# Patient Record
Sex: Male | Born: 1955 | Race: White | Hispanic: No | Marital: Married | Smoking: Former smoker
Health system: Southern US, Community
[De-identification: ages and names within clinical notes are randomized; demographics above are authoritative.]

## PROBLEM LIST (undated history)

## (undated) DIAGNOSIS — K219 Gastro-esophageal reflux disease without esophagitis: Secondary | ICD-10-CM

## (undated) DIAGNOSIS — T4145XA Adverse effect of unspecified anesthetic, initial encounter: Secondary | ICD-10-CM

## (undated) DIAGNOSIS — C801 Malignant (primary) neoplasm, unspecified: Secondary | ICD-10-CM

## (undated) DIAGNOSIS — G4730 Sleep apnea, unspecified: Secondary | ICD-10-CM

## (undated) DIAGNOSIS — J45909 Unspecified asthma, uncomplicated: Secondary | ICD-10-CM

## (undated) DIAGNOSIS — C44319 Basal cell carcinoma of skin of other parts of face: Secondary | ICD-10-CM

## (undated) DIAGNOSIS — T8859XA Other complications of anesthesia, initial encounter: Secondary | ICD-10-CM

## (undated) DIAGNOSIS — R0600 Dyspnea, unspecified: Secondary | ICD-10-CM

## (undated) DIAGNOSIS — C8590 Non-Hodgkin lymphoma, unspecified, unspecified site: Secondary | ICD-10-CM

## (undated) DIAGNOSIS — R413 Other amnesia: Secondary | ICD-10-CM

## (undated) DIAGNOSIS — E119 Type 2 diabetes mellitus without complications: Secondary | ICD-10-CM

## (undated) DIAGNOSIS — C859A Non-Hodgkin lymphoma, unspecified, in remission: Secondary | ICD-10-CM

## (undated) DIAGNOSIS — R519 Headache, unspecified: Secondary | ICD-10-CM

## (undated) DIAGNOSIS — I509 Heart failure, unspecified: Secondary | ICD-10-CM

## (undated) DIAGNOSIS — Z8719 Personal history of other diseases of the digestive system: Secondary | ICD-10-CM

## (undated) DIAGNOSIS — I10 Essential (primary) hypertension: Secondary | ICD-10-CM

## (undated) DIAGNOSIS — R918 Other nonspecific abnormal finding of lung field: Secondary | ICD-10-CM

## (undated) DIAGNOSIS — M109 Gout, unspecified: Secondary | ICD-10-CM

## (undated) DIAGNOSIS — J449 Chronic obstructive pulmonary disease, unspecified: Secondary | ICD-10-CM

## (undated) DIAGNOSIS — M1990 Unspecified osteoarthritis, unspecified site: Secondary | ICD-10-CM

## (undated) DIAGNOSIS — G709 Myoneural disorder, unspecified: Secondary | ICD-10-CM

## (undated) DIAGNOSIS — I772 Rupture of artery: Secondary | ICD-10-CM

## (undated) DIAGNOSIS — R51 Headache: Secondary | ICD-10-CM

## (undated) HISTORY — PX: BACK SURGERY: SHX140

## (undated) HISTORY — DX: Heart failure, unspecified: I50.9

## (undated) HISTORY — PX: COLONOSCOPY: SHX174

## (undated) HISTORY — PX: KNEE ARTHROSCOPY: SUR90

## (undated) HISTORY — PX: BICEPS TENDON REPAIR: SHX566

## (undated) HISTORY — PX: CARPAL TUNNEL RELEASE: SHX101

## (undated) HISTORY — DX: Unspecified asthma, uncomplicated: J45.909

## (undated) HISTORY — DX: Type 2 diabetes mellitus without complications: E11.9

## (undated) HISTORY — PX: FINGER SURGERY: SHX640

## (undated) HISTORY — PX: PILONIDAL CYST EXCISION: SHX744

---

## 1997-06-01 ENCOUNTER — Ambulatory Visit (HOSPITAL_COMMUNITY): Admission: RE | Admit: 1997-06-01 | Discharge: 1997-06-01 | Payer: Self-pay | Admitting: Geriatric Medicine

## 1997-07-17 ENCOUNTER — Ambulatory Visit (HOSPITAL_COMMUNITY): Admission: RE | Admit: 1997-07-17 | Discharge: 1997-07-17 | Payer: Self-pay | Admitting: Gastroenterology

## 1997-07-20 ENCOUNTER — Inpatient Hospital Stay (HOSPITAL_COMMUNITY): Admission: EM | Admit: 1997-07-20 | Discharge: 1997-07-24 | Payer: Self-pay | Admitting: Emergency Medicine

## 1997-07-28 ENCOUNTER — Ambulatory Visit (HOSPITAL_COMMUNITY): Admission: RE | Admit: 1997-07-28 | Discharge: 1997-07-28 | Payer: Self-pay | Admitting: Gastroenterology

## 1997-07-29 ENCOUNTER — Ambulatory Visit (HOSPITAL_COMMUNITY): Admission: RE | Admit: 1997-07-29 | Discharge: 1997-07-29 | Payer: Self-pay | Admitting: Gastroenterology

## 1997-08-12 ENCOUNTER — Ambulatory Visit (HOSPITAL_COMMUNITY): Admission: RE | Admit: 1997-08-12 | Discharge: 1997-08-12 | Payer: Self-pay | Admitting: Hematology & Oncology

## 1997-08-14 ENCOUNTER — Ambulatory Visit (HOSPITAL_COMMUNITY): Admission: RE | Admit: 1997-08-14 | Discharge: 1997-08-14 | Payer: Self-pay | Admitting: Internal Medicine

## 1998-05-04 ENCOUNTER — Encounter: Admission: RE | Admit: 1998-05-04 | Discharge: 1998-08-02 | Payer: Self-pay | Admitting: *Deleted

## 2001-02-25 ENCOUNTER — Ambulatory Visit (HOSPITAL_BASED_OUTPATIENT_CLINIC_OR_DEPARTMENT_OTHER): Admission: RE | Admit: 2001-02-25 | Discharge: 2001-02-25 | Payer: Self-pay | Admitting: Rheumatology

## 2001-10-23 ENCOUNTER — Encounter: Payer: Self-pay | Admitting: Internal Medicine

## 2001-10-23 ENCOUNTER — Encounter: Admission: RE | Admit: 2001-10-23 | Discharge: 2001-10-23 | Payer: Self-pay | Admitting: Internal Medicine

## 2001-10-31 ENCOUNTER — Ambulatory Visit (HOSPITAL_COMMUNITY): Admission: RE | Admit: 2001-10-31 | Discharge: 2001-10-31 | Payer: Self-pay | Admitting: Internal Medicine

## 2003-02-19 ENCOUNTER — Ambulatory Visit: Admission: RE | Admit: 2003-02-19 | Discharge: 2003-02-19 | Payer: Self-pay

## 2004-06-30 ENCOUNTER — Ambulatory Visit (HOSPITAL_BASED_OUTPATIENT_CLINIC_OR_DEPARTMENT_OTHER): Admission: RE | Admit: 2004-06-30 | Discharge: 2004-06-30 | Payer: Self-pay | Admitting: Orthopaedic Surgery

## 2004-06-30 ENCOUNTER — Ambulatory Visit (HOSPITAL_COMMUNITY): Admission: RE | Admit: 2004-06-30 | Discharge: 2004-06-30 | Payer: Self-pay | Admitting: Orthopaedic Surgery

## 2004-07-11 ENCOUNTER — Encounter: Admission: RE | Admit: 2004-07-11 | Discharge: 2004-07-11 | Payer: Self-pay | Admitting: Internal Medicine

## 2004-07-27 ENCOUNTER — Ambulatory Visit (HOSPITAL_COMMUNITY): Admission: RE | Admit: 2004-07-27 | Discharge: 2004-07-27 | Payer: Self-pay | Admitting: Gastroenterology

## 2004-08-31 ENCOUNTER — Encounter: Admission: RE | Admit: 2004-08-31 | Discharge: 2004-08-31 | Payer: Self-pay | Admitting: Gastroenterology

## 2004-12-30 ENCOUNTER — Ambulatory Visit (HOSPITAL_COMMUNITY): Admission: RE | Admit: 2004-12-30 | Discharge: 2004-12-30 | Payer: Self-pay | Admitting: Orthopedic Surgery

## 2006-02-23 ENCOUNTER — Ambulatory Visit: Payer: Self-pay | Admitting: Internal Medicine

## 2006-05-16 DIAGNOSIS — Z8719 Personal history of other diseases of the digestive system: Secondary | ICD-10-CM

## 2006-05-16 HISTORY — PX: UPPER GASTROINTESTINAL ENDOSCOPY: SHX188

## 2006-05-16 HISTORY — DX: Personal history of other diseases of the digestive system: Z87.19

## 2009-06-11 ENCOUNTER — Encounter: Admission: RE | Admit: 2009-06-11 | Discharge: 2009-06-11 | Payer: Self-pay | Admitting: Internal Medicine

## 2010-06-17 NOTE — Op Note (Signed)
NAME:  Jeffery Wood, ANGERER                ACCOUNT NO.:  [id number]   MEDICAL RECORD NO.:  [id number]          PATIENT TYPE:  AMB   LOCATION:  SDS                          FACILITY:  [facility]   PHYSICIAN:  Almedia Balls. Ranell Patrick, M.D. DATE OF BIRTH:  1955-04-19   DATE OF PROCEDURE:  12/30/2004  DATE OF DISCHARGE:                                 OPERATIVE REPORT   PREOPERATIVE DIAGNOSIS:  Left carpal tunnel syndrome.   POSTOPERATIVE DIAGNOSIS:  Left carpal tunnel syndrome.   OPERATION PERFORMED:  Left carpal tunnel release.   SURGEON:  Almedia Balls. Ranell Patrick, M.D.   ASSISTANT:  __________ , P. A.-C   ANESTHESIA:  Local carpal tunnel block anesthesia plus minimal sedation was  used.   ESTIMATED BLOOD LOSS:  The estimated blood loss was minimal.   TOURNIQUET TIME:  The tourniquet time was 25 minutes.   COUNTS:  Instrument count was correct.   COMPLICATIONS:  There were no complications.   MEDICATIONS:  Preoperative antibiotics were given.   INDICATIONS:  The patient is a 55 year old male with a history of worsening  left carpal tunnel syndrome.  The patient has failed conservative  management.  The patient has been splinted.  He has had activity  modifications; and, has positive EMG and nerve conduction studies for severe  carpal tunnel.  He presents now for operative carpal tunnel release.   Informed consent was obtained.   DESCRIPTION OF THE OPERATION:  After an adequate level of anesthesia was  achieved the patient was positioned supine on the operating room table.  A  nonsterile tourniquet was placed on the left proximal arm.  The left arm was  sterile;y prepped and draped in the usual manner.  A hand table was  utilized.  After sterile prep and drape we exsanguinated the limb using an  Esmarch bandage.  We elevated the tourniquet to 275 mmHg.   A longitudinal palmar incision was created in line with the third and fourth  ray.  Dissection was carried sharply down through the subcutaneous  tissues.  The transverse carpal tunnel ligament was identified and divided in its  entirety from the midcarpal-to-carpal space into the superficial forearm  fascia.  Interestingly the patient's nerve was pressed far up against the  radial side of the carpal canal and was tethered, and not free at all.  I  was stuck up under the radial flap of the transverse carpal ligament  entering into the superficial forearm compartment.  I was concerned because  of the lack of mobility of the nerve; and, because of this I went ahead and  performed an incision across the volar wrist crease into the forearm to  allow for adequate visualization of the nerve in the forearm.  We noted the  nerve to be very scarred up against the radial side of the carpal canal.  We  performed a limited neurolysis freeing the nerve up from surrounding scar  tissue and getting it to where it was nice and free in the middle portion of  the carpal canal.   The individual nerve branches in the midpalmar space  were not dissected out  as they appeared to be covered in fat and appeared to be nice and immobile.  We did do a limited tenosynovectomy as well given that there was a lot of  tenosynovial tissue that was present and providing a mass effect in the  carpal canal.  Thus was removed sharply using the Metzenbaum scissors.  We  thoroughly irrigated the carpal canal.  The nerve did have a __________  deformity indicating chronic pressure and was to have a lot of hyperemia,  and purplish color to it; but, was not in continuity.   We did close the wound using interrupted nylon sutures.  We then placed the  patient in a short-arm sling.   The patient tolerated surgery well and was taken to the recovery room in  stable condition.           ______________________________  Almedia Balls Ranell Patrick, M.D.     SRN/MEDQ  D:  12/30/2004  T:  01/01/2005  Job:  [id number]

## 2010-06-17 NOTE — Op Note (Signed)
NAME:  Jeffery Wood, Jeffery Wood NO.:  [id number]   MEDICAL RECORD NO.:  [id number]          PATIENT TYPE:  AMB   LOCATION:  DSC                          FACILITY:  [facility]   PHYSICIAN:  Claude Manges. Whitfield, M.D.DATE OF BIRTH:  May 17, 1955   DATE OF PROCEDURE:  06/30/2004  DATE OF DISCHARGE:                                 OPERATIVE REPORT   PREOPERATIVE DIAGNOSIS:  Mass right long finger.   POSTOPERATIVE DIAGNOSIS:  Mass right long finger - cyst.   PROCEDURE:  Excision of cyst right long finger.   SURGEON:  Claude Manges. Cleophas Dunker, M.D.   ANESTHESIA:  IV regional.   COMPLICATIONS:  None.   HISTORY:  A 55 year old gentleman who has noted a mass in his right long  finger for several years.  He thinks recently it is a little bit larger and  slightly more symptomatic.  There has been no history of injury or trauma.  The mass feels hard but just recently it seems to increase and decrease in  size which would be more consistent with a cyst.  He has not had either an  excisional biopsy or excision of the mass.   DESCRIPTION OF PROCEDURE:  With the patient comfortable on the operating  table, IV Xylocaine was administered by anesthesia of the right upper  extremity with a proximal double tourniquet.  The right upper extremity was  then prepped with DuraPrep from the tips of the fingers to the mid forearm.  Sterile draping was performed.  A longitudinal incision was then made over  the dorsal ulnar aspect of the right long finger coursing just distal to the  mid portion of the middle phalanx dorsally to the PIP joint and then I  curved it over the transverse flexion of the PIP joint.  Via sharp  dissection, incision was carried down to the subcutaneous tissue. When the  skin was elevated, there was an obvious cyst that was beneath the extensor  mechanism and this was then incised.  The cyst was removed as well as its  synovial tissue.  It appeared to arise from either the extensor  mechanism or  the joint.  I made a small incision along the joint laterally and did a  minimal synovectomy.  The wound was irrigated with saline solution.  I  closed the capsulotomy and did not find any further abnormal tissue.  The  cyst appeared to be completely decompressed.  The skin was then closed with  interrupted 4-0 Ethilon.  The capsule was closed with 4-0 Vicryl.  Sterile  bulky dressing was applied.  I injected 0.25% Marcaine without epinephrine  into the incision.  Sterile bulky dressing was applied followed by Coban.   PLAN:  1.  Office in one week.  2.  Vicodin for pain.       PWW/MEDQ  D:  06/30/2004  T:  06/30/2004  Job:  [id number]

## 2010-06-17 NOTE — Assessment & Plan Note (Signed)
[facility]                             PULMONARY OFFICE NOTE   NAME:Jeffery Wood, Jeffery Wood                       MRN:          [id number]  DATE:02/23/2006                            DOB:          12/06/1955    PULMONARY/SLEEP FOLLOWUP   PROBLEM:  A 55 year old man who had been a patient at [facility] where he was last seen in 2004 comes now to  establish as a new patient with this practice for help with asthma and  sleep problems.   HISTORY:  He is a former smoker with a long history of asthma/COPD.  Office spirometry in 2004 had given an FEV1 of 3000 (70% of predicted)  after an insignificant response to bronchodilator.  He asks for a refill  of his chronic asthma medications and indicates he has caught a cold  with increased chest tightness and wheeze.  He also asks attention to  his difficulty initiating and maintaining sleep.  He had been using  clonazepam 0.5 mg but it carries over causing daytime sleepiness.  Without it, he has difficulty initiating sleep and will wake at least  two or three times every night.  His wife complains of his loud snoring.  He had tried at least one other medicine as a sleeping pill but is  concerned they will affect his memory.  He fights sleepiness if he sits  still at work.   MEDICATIONS:  1. Omeprazole.  2. Advair 250/50.  3. Atrovent.  4. Clonazepam 0.5 mg at h.s.  5. Home nebulizer with albuterol and ipratropium, currently out of      medication.   No medication allergy.   REVIEW OF SYSTEMS:  Loud snoring.  Daytime sleepiness if inactive.  He  wakes sometimes as tired as he went to bed.  Difficulty maintaining  sleep.  Cough and wheeze.  [place] or trace green sputum.  No blood, no  chest pain.   PAST HISTORY:  1. He had evaluation for suspected carcinoid syndrome with no tumor      ever found despite bronchoscopy and colonoscopy.  2. Chronic asthmatic bronchitis.  3.  Diabetes.  4. Psoriasis.  5. Gout.   SOCIAL HISTORY:  He quit smoking 1990.  Quit alcohol 1991.  Drinks a lot  of caffeine.  Married.  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   FAMILY HISTORY:  Asthma.   OBJECTIVE:  VITAL SIGNS:  Weight 294 pounds, BP 104/78, pulse regular at  82, room air saturation 96%.  GENERAL:  This is an alert, overweight gentleman, no acute distress.  SKIN:  No rash.  ADENOPATHY:  None found at the neck, shoulders or axillae.  HEENT:  He could breathe comfortably with his mouth closed.  Palate is  long, spacing 3-4/4.  Voice quality normal, no stridor.  No neck vein  distention or thyromegaly.  CHEST:  Diffuse rhonchi and wheeze, congested cough.  Heart sounds  regular without murmur or gallop.  ABDOMEN:  Significant male pattern obesity.  EXTREMITIES:  No  cyanosis, clubbing or edema.   NPSG:  He had a sleep study on February 25, 2001, through the [facility]  sleep center demonstrating moderately-severe obstructive apnea with an  index of 53 per hour, all of which were hypopneas.  Events were not  positional, associated with moderate snoring, normal oxygenation with  nadir 91%, normal cardiac rhythm.  He could not tolerate CPAP for a  split-study titration protocol on that night and has never tried CPAP at  home.   IMPRESSION:  1. Acute exacerbation of asthma with chronic obstructive pulmonary      disease.  2. Obstructive sleep apnea with insomnia despite clonazepam.      Inadequate control without CPAP.  3. Obesity.   PLAN:  1. Nebulizer treatment here Xopenex 1.25 mg, Depo-Medrol 80 mg IM  2. We refilled nebulizer solution albuterol 2.5 mg/ipratropium 0.5 mg      q.i.d. p.r.n.  3. Weight loss and driving responsibility were emphasized.  4. We are arranging CPAP titration and he is going to try again with      newer mask styles, anticipating he may benefit from a nasal pillows      mask and  humidifier.   Will schedule return 1 month, earlier p.r.n.     Clinton D. Maple Hudson, MD, Tonny Bollman, FACP  Electronically Signed    CDY/MedQ  DD: 02/23/2006  DT: 02/23/2006  Job #: [id number]   cc:   Erskine Speed, M.D.

## 2010-10-27 ENCOUNTER — Ambulatory Visit (HOSPITAL_COMMUNITY)
Admission: RE | Admit: 2010-10-27 | Discharge: 2010-10-27 | Disposition: A | Discharge status: Home / Self Care | Payer: BC Managed Care – PPO | Source: Ambulatory Visit | Attending: Internal Medicine | Admitting: Internal Medicine

## 2010-10-27 DIAGNOSIS — R079 Chest pain, unspecified: Secondary | ICD-10-CM | POA: Insufficient documentation

## 2010-12-01 ENCOUNTER — Ambulatory Visit
Admission: RE | Admit: 2010-12-01 | Discharge: 2010-12-01 | Disposition: A | Discharge status: Home / Self Care | Payer: BC Managed Care – PPO | Source: Ambulatory Visit | Attending: Cardiology | Admitting: Cardiology

## 2010-12-01 ENCOUNTER — Other Ambulatory Visit: Payer: Self-pay | Admitting: Cardiology

## 2010-12-01 DIAGNOSIS — R0602 Shortness of breath: Secondary | ICD-10-CM

## 2010-12-05 ENCOUNTER — Encounter (HOSPITAL_COMMUNITY): Payer: Self-pay

## 2010-12-06 ENCOUNTER — Encounter (HOSPITAL_COMMUNITY): Payer: Self-pay | Admitting: Pharmacy Technician

## 2010-12-07 ENCOUNTER — Ambulatory Visit (HOSPITAL_COMMUNITY)
Admission: RE | Admit: 2010-12-07 | Discharge: 2010-12-07 | Disposition: A | Discharge status: Home / Self Care | Payer: BC Managed Care – PPO | Source: Ambulatory Visit | Attending: Cardiology | Admitting: Cardiology

## 2010-12-07 ENCOUNTER — Encounter (HOSPITAL_COMMUNITY)
Admission: RE | Disposition: A | Discharge status: Home / Self Care | Payer: Self-pay | Source: Ambulatory Visit | Attending: Cardiology

## 2010-12-07 ENCOUNTER — Encounter (HOSPITAL_COMMUNITY): Payer: Self-pay

## 2010-12-07 DIAGNOSIS — I10 Essential (primary) hypertension: Secondary | ICD-10-CM | POA: Insufficient documentation

## 2010-12-07 DIAGNOSIS — E119 Type 2 diabetes mellitus without complications: Secondary | ICD-10-CM | POA: Insufficient documentation

## 2010-12-07 DIAGNOSIS — R9439 Abnormal result of other cardiovascular function study: Secondary | ICD-10-CM | POA: Insufficient documentation

## 2010-12-07 DIAGNOSIS — R739 Hyperglycemia, unspecified: Secondary | ICD-10-CM | POA: Insufficient documentation

## 2010-12-07 DIAGNOSIS — E669 Obesity, unspecified: Secondary | ICD-10-CM | POA: Insufficient documentation

## 2010-12-07 DIAGNOSIS — R0789 Other chest pain: Secondary | ICD-10-CM | POA: Insufficient documentation

## 2010-12-07 DIAGNOSIS — Z01812 Encounter for preprocedural laboratory examination: Secondary | ICD-10-CM | POA: Insufficient documentation

## 2010-12-07 HISTORY — PX: LEFT HEART CATHETERIZATION WITH CORONARY ANGIOGRAM: SHX5451

## 2010-12-07 LAB — CBC
HCT: 45.3 % (ref 39.0–52.0)
MCH: 32.5 pg (ref 26.0–34.0)
MCHC: 35.8 g/dL (ref 30.0–36.0)
MCV: 90.8 fL (ref 78.0–100.0)
RDW: 13 % (ref 11.5–15.5)

## 2010-12-07 LAB — GLUCOSE, CAPILLARY: Glucose-Capillary: 123 mg/dL — ABNORMAL HIGH (ref 70–99)

## 2010-12-07 SURGERY — LEFT HEART CATHETERIZATION WITH CORONARY ANGIOGRAM
Anesthesia: LOCAL

## 2010-12-07 MED ORDER — SODIUM CHLORIDE 0.9 % IV SOLN
INTRAVENOUS | Status: DC
  Administered 2010-12-07: 10:00:00 via INTRAVENOUS

## 2010-12-07 MED ORDER — FENTANYL CITRATE 0.05 MG/ML IJ SOLN
INTRAMUSCULAR | Status: AC
  Administered 2010-12-07: 15:00:00
  Filled 2010-12-07: qty 2

## 2010-12-07 MED ORDER — MIDAZOLAM HCL 2 MG/2ML IJ SOLN
INTRAMUSCULAR | Status: AC
  Filled 2010-12-07: qty 2

## 2010-12-07 MED ORDER — ASPIRIN 81 MG PO CHEW: 243.0000 mg | CHEWABLE_TABLET | Freq: Once | ORAL | Status: DC

## 2010-12-07 MED ORDER — SODIUM CHLORIDE 0.9 % IJ SOLN: 3.0000 mL | Freq: Two times a day (BID) | INTRAMUSCULAR | Status: DC

## 2010-12-07 MED ORDER — ONDANSETRON HCL 4 MG/2ML IJ SOLN: 4.0000 mg | Freq: Four times a day (QID) | INTRAMUSCULAR | Status: DC | PRN

## 2010-12-07 MED ORDER — SODIUM CHLORIDE 0.9 % IJ SOLN: 3.0000 mL | INTRAMUSCULAR | Status: DC | PRN

## 2010-12-07 MED ORDER — MORPHINE SULFATE 4 MG/ML IJ SOLN
INTRAMUSCULAR | Status: AC
  Administered 2010-12-07: 1 mg
  Filled 2010-12-07: qty 1

## 2010-12-07 MED ORDER — FENTANYL CITRATE 0.05 MG/ML IJ SOLN
INTRAMUSCULAR | Status: AC
  Filled 2010-12-07: qty 2

## 2010-12-07 MED ORDER — VERAPAMIL HCL 2.5 MG/ML IV SOLN
INTRAVENOUS | Status: AC
  Filled 2010-12-07: qty 2

## 2010-12-07 MED ORDER — MORPHINE SULFATE 2 MG/ML IJ SOLN: 1.0000 mg | INTRAMUSCULAR | Status: DC | PRN

## 2010-12-07 MED ORDER — ASPIRIN 81 MG PO CHEW
324.0000 mg | CHEWABLE_TABLET | ORAL | Status: DC
  Filled 2010-12-07: qty 3

## 2010-12-07 MED ORDER — SODIUM CHLORIDE 0.9 % IV SOLN: 250.0000 mL | INTRAVENOUS | Status: DC

## 2010-12-07 MED ORDER — OXYCODONE-ACETAMINOPHEN 5-325 MG PO TABS
1.0000 | ORAL_TABLET | Freq: Four times a day (QID) | ORAL | Status: DC | PRN
  Administered 2010-12-07: 2 via ORAL

## 2010-12-07 MED ORDER — NITROGLYCERIN 0.2 MG/ML ON CALL CATH LAB
INTRAVENOUS | Status: AC
  Filled 2010-12-07: qty 1

## 2010-12-07 MED ORDER — SODIUM CHLORIDE 0.9 % IV SOLN
1.0000 mL/kg/h | INTRAVENOUS | Status: AC
Start: 2010-12-07 — End: 2010-12-07

## 2010-12-07 MED ORDER — HEPARIN (PORCINE) IN NACL 2-0.9 UNIT/ML-% IJ SOLN
INTRAMUSCULAR | Status: AC
  Filled 2010-12-07: qty 2000

## 2010-12-07 MED ORDER — ACETAMINOPHEN 325 MG PO TABS
ORAL_TABLET | ORAL | Status: AC
  Filled 2010-12-07: qty 2

## 2010-12-07 MED ORDER — OXYCODONE-ACETAMINOPHEN 5-325 MG PO TABS
ORAL_TABLET | ORAL | Status: AC
  Filled 2010-12-07: qty 2

## 2010-12-07 MED ORDER — ACETAMINOPHEN 325 MG PO TABS
650.0000 mg | ORAL_TABLET | ORAL | Status: DC | PRN
  Administered 2010-12-07: 650 mg via ORAL

## 2010-12-07 NOTE — Op Note (Signed)
[facility]     CARDIAC CATHETERIZATION REPORT  Jeffery Wood   [id number] 09/25/1955  Performing Cardiologist: Jeffery Wood Primary Physician: Jeffery Hodgkins  MD Primary Cardiologist:  Jeffery Manson  MD  Procedures Performed:  Left Heart Catheterization via 5 Fr Right Groin access  Left Ventriculography, (RAO/LAO) 12 ml/sec for 30 ml total contrast  Native Coronary Angiography  Indication(s):   Chest pain  Abnormal Treadmill Stress Test  Diabetes  History: 55 y.o. male was recently evaluated at [facility] by Dr. Clarene Wood for chest pain and and abnormal TM Stress test with ST depressions in V4-V5. 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  All questions were answered.    Risks / Complications include, but not limited to: Death, MI, CVA/TIA, VF/VT (with defibrillation), Bradycardia (need for temporary pacer placement), contrast induced nephropathy, bleeding / bruising / hematoma / pseudoaneurysm, vascular or coronary injury (with possible emergent CT or Vascular Surgery), adverse medication reactions, infection.    The patient voiced understanding and agree to proceed.    Risks of procedure as well as the alternatives and risks of each were explained to the (patient/caregiver).  Consent for procedure obtained. Consent for signed by MD and patient with RN witness -- placed on chart.  Procedure: The patient was brought to the 2nd Floor [facility] in the fasting state and prepped and draped in the usual sterile fashion for (Right groin or radial) access. (A modified Allen's test with plethysmography was performed on the right wrist demonstrating adequate Ulnar Artery collateral flow).    Sterile technique was used including antiseptics, cap,  gloves, gown, hand hygiene, mask and sheet.  Skin prep: Chlorhexidine;  Time Out: Verified patient identification, verified procedure, site/side was marked, verified correct patient position, special equipment/implants available, medications/allergies/relevent history reviewed, required imaging and test results available.  Performed  The right wrist was anesthetized with 1% subcutaneous Lidocaine.  The right radial artery was accessed using the Seldinger Technique but I was unable to pass a wire into the artery.  Therefore, radial access was abandoned & femoral access was pursued.   The right femoral head was identified using tactile and fluoroscopic technique.  The right groin was anesthetized with 1% subcutaneous Lidocaine.  The right Common Femoral Artery was accessed using the Modified Seldinger Technique with placement of a antimicrobial bonded/coated single lumen (5 Fr) sheath was placed in the Right Common Femoral Artery using the Seldinger technique.  The sheath was aspirated and flushed.    A 5 Fr JL4 followed by a JR4 Catheter were advanced of over a Standard J wire into the ascending Aorta.  The catheter was used to engage the Left then Right coronary artery.  Multiple cineangiographic views of the both coronary artery systems were performed.   The JR4 catheter was then exchanged over the J wire for a 5 Fr angled Pigtail catheter that was advanced across the Aortic Valve.  LV hemodynamics were measured (and) Left Ventriculography was performed.  LV hemodynamics were then re-sampled, and the catheter was pulled back across the Aortic Valve for measurement of "pull-back" gradient.  The catheter and the wire was removed completely out of the body.  The patient was transferred to the holding area where the sheath was removed with manual pressure held for hemostasis.    The patient was transported to the holding area  in stable condition.   The patient  was stable before, during and following the  procedure.   Patient did tolerate procedure well. There were not complications.  EBL: <50ml  Medications:  Premedication:  5 mg  Valium  Sedation:  3 mg IV Versed, 75 mcg IV Fentanyl  Contrast:  Omnipaque  Hemodynamics:  Central Aortic Pressure / Mean Aortic Pressure: 109/74 mmHg; 91 mmHg  LV Pressure / LV End diastolic Pressure:  107/5 mmHg; 14 mmHg  Left Ventriculography:  EF:  55-60%  Wall Motion: Normal  Coronary Angiographic Data:  Left Main: Large; LAD, RI & LCx; Angiographically normal  Left Anterior Descending (LAD):  Moderate to large; Angiographically normal  1st diagonal (D1):  Angiographically normal  2nd diagonal (D2):  Angiographically normal  3rd diagonal (D3): Angiographically normal  Circumflex (LCx):  Angiographically normal  1st obtuse marginal:  Angiographically normal  2nd obtuse marginal:  Angiographically normal 3rd obtuse marginal:  Angiographically normal   Ramus Intermedius: small; Angiographically normal  Right Coronary Artery: dominant; moderate-large; Angiographically normal  right ventricle branch of right coronary artery: Angiographically normal  posterior descending artery: Angiographically normal  posterior lateral branch: small; Angiographically normal        Impression: 1. No angiographic evidence of significant CAD to explain Chest pain or stress test results. 2.  Non-cardiac / Non-anginal chest pain 3.  Normal LVEF  Plan: 1.  Standard Post-cath care 2.  Risk factor modification as Outpatient 3.  D/c after bedrest - f/u with Dr. Clarene Wood  The case and results was discussed with the patient (and family). The case and results was not discussed with the patient's PCP. The case and results was discussed with the patient's Cardiologist.  Time Spend Directly with Patient:  35 minutes  Wood,Jeffery W 12/07/2010, 3:38 PM

## 2010-12-07 NOTE — Progress Notes (Signed)
Client up and walked and tol well right groin stable,no bleeding or hematoma

## 2010-12-07 NOTE — Discharge Summary (Signed)
Physician Discharge Summary  Patient ID: Jeffery Wood MRN: [id number] DOB/AGE: January 23, 1956 55 y.o.  Admit date: 12/07/2010 Discharge date: 12/07/2010  Admission Diagnoses:  Discharge Diagnoses:  Active Problems:  * No active hospital problems. *    Discharged Condition: good  Hospital Course: Jeffery Wood came in for diagnostic Left Heart Catheterization and Coronary Angiography.  His procedure went well with no complications.  See full procedure note.  He will be discharged upon completion of his post cath bedrest.  Significant Diagnostic Studies: angiography: coronary, LV Gram, LHC, PIV x 2  Treatments: procedures: See Above  Discharge Exam: Blood pressure 137/84, pulse 82, temperature 98.1 F (36.7 C), temperature source Oral, resp. rate 18, height 6\' 3"  (1.905 m), weight 127.914 kg (282 lb), SpO2 97.00%. General appearance: alert and no distress Head: Normocephalic, without obvious abnormality, atraumatic Resp: clear to auscultation bilaterally, normal percussion bilaterally and non-labored Cardio: regular rate and rhythm, S1, S2 normal, no murmur, click, rub or gallop Extremities: extremities normal, atraumatic, no cyanosis or edema Pulses: 2+ and symmetric; groin site intact.    Disposition: Stable   Current Discharge Medication List    CONTINUE these medications which have NOT CHANGED   Details  acetaminophen (TYLENOL) 650 MG CR tablet Take 1,300 mg by mouth 2 (two) times daily as needed. For pain     aspirin EC 81 MG tablet Take 81 mg by mouth daily.      betamethasone dipropionate (DIPROLENE) 0.05 % cream Apply 1 application topically daily.      Biotin 1000 MCG tablet Take 1,000 mcg by mouth 2 (two) times daily.      clonazePAM (KLONOPIN) 0.5 MG tablet Take 0.5 mg by mouth at bedtime.      etodolac (LODINE) 400 MG tablet Take 400 mg by mouth 3 (three) times daily as needed. For gout pain     Fluticasone-Salmeterol (ADVAIR) 100-50 MCG/DOSE AEPB Inhale 1 puff  into the lungs daily.      glimepiride (AMARYL) 4 MG tablet Take 2 mg by mouth daily before breakfast.      ipratropium (ATROVENT HFA) 17 MCG/ACT inhaler Inhale 2 puffs into the lungs every 8 (eight) hours as needed. For shortness of breath     loratadine (CLARITIN) 10 MG tablet Take 10 mg by mouth daily.      Multiple Vitamins-Minerals (MULTIVITAMINS THER. Wood/MINERALS) TABS Take 1 tablet by mouth daily.      naproxen sodium (ANAPROX) 220 MG tablet Take 440 mg by mouth 2 (two) times daily as needed. For pain     omeprazole (PRILOSEC) 20 MG capsule Take 20 mg by mouth 2 (two) times daily.      traMADol-acetaminophen (ULTRACET) 37.5-325 MG per tablet Take 2 tablets by mouth 3 (three) times daily as needed. For pain     metFORMIN (GLUCOPHAGE) 1000 MG tablet Take 1,000 mg by mouth 2 (two) times daily with a meal.         Follow-up Information    Follow up with LITTLE, ALFRED B, MD. Make an appointment in 1 week. (As planned prior to catheterization.)    Contact information:   [address] [place] [address] [phone]         Standard Post-catheterization precautions.  Signed: [name],DAVID Wood 12/07/2010, 3:56 PM

## 2010-12-07 NOTE — H&P (Signed)
History and Physical Interval Note:  12/07/2010 1:57 PM  Performing MD:  Marykay Lex, MD Primary Cardiologist: Julieanne Manson, MD  Mr. While has presented today for surgery, with the diagnosis of chest pain and an abnormal TM stress test (ST depressions in V4-V5) with profound hypertension.  His additional cardiovascular risk factors are: DM-2, HTN, obesity with OSA (unable to tolerate CPAP) along with carcinoid tumor. The various methods of treatment have been discussed with the patient and family. After consideration of risks, benefits and other options for treatment, the patient has consented to Procedure(s):  LEFT HEART CATHETERIZATION AND CORONARY ANGIOGRAPHY +/- AD HOC PERCUTANEOUS CORONARY INTERVENTION as a surgical intervention.   The patients' history has been reviewed, patient examined, no change in status from most recent note dated 11/30/2010.  He is stable for proceeding with the planned procedure. I have reviewed the patients' chart and labs. Questions were answered to the patient's satisfaction.   Procedure:  LEFT HEART CATHETERIZATION AND CORONARY ANGIOGRAPHY +/- AD HOC PERCUTANEOUS CORONARY INTERVENTION   The procedure with Risks/Benefits/Alternatives and Indications was reviewed with the patient.  All questions were answered.    Risks / Complications include, but not limited to: Death, MI, CVA/TIA, VF/VT (with defibrillation), Bradycardia (need for temporary pacer placement), contrast induced nephropathy, bleeding / bruising / hematoma / pseudoaneurysm, vascular or coronary injury (with possible emergent CT or Vascular Surgery), adverse medication reactions, infection.    The patient voices understanding and agree to proceed.   I have signed the consent form and placed it on the chart for patient signature and RN witness.     Kynesha Guerin W 12/07/2010, 2:02 PM

## 2010-12-07 NOTE — Progress Notes (Signed)
Pt. Holding in holding area waiting on dr. Herbie Baltimore to finish in another room.

## 2011-02-13 ENCOUNTER — Other Ambulatory Visit: Payer: Self-pay | Admitting: Otolaryngology

## 2011-02-20 ENCOUNTER — Ambulatory Visit
Admission: RE | Admit: 2011-02-20 | Discharge: 2011-02-20 | Disposition: A | Discharge status: Home / Self Care | Payer: BC Managed Care – PPO | Source: Ambulatory Visit | Attending: Otolaryngology | Admitting: Otolaryngology

## 2011-02-20 MED ORDER — GADOBENATE DIMEGLUMINE 529 MG/ML IV SOLN
20.0000 mL | Freq: Once | INTRAVENOUS | Status: AC | PRN
  Administered 2011-02-20: 20 mL via INTRAVENOUS

## 2011-02-22 ENCOUNTER — Other Ambulatory Visit: Payer: Self-pay | Admitting: Otolaryngology

## 2011-02-22 DIAGNOSIS — I6509 Occlusion and stenosis of unspecified vertebral artery: Secondary | ICD-10-CM

## 2011-02-22 DIAGNOSIS — R42 Dizziness and giddiness: Secondary | ICD-10-CM

## 2011-02-27 ENCOUNTER — Ambulatory Visit
Admission: RE | Admit: 2011-02-27 | Discharge: 2011-02-27 | Disposition: A | Discharge status: Home / Self Care | Payer: BC Managed Care – PPO | Source: Ambulatory Visit | Attending: Otolaryngology | Admitting: Otolaryngology

## 2011-02-27 DIAGNOSIS — R42 Dizziness and giddiness: Secondary | ICD-10-CM

## 2011-02-27 DIAGNOSIS — I6509 Occlusion and stenosis of unspecified vertebral artery: Secondary | ICD-10-CM

## 2011-02-27 MED ORDER — IOHEXOL 350 MG/ML SOLN
100.0000 mL | Freq: Once | INTRAVENOUS | Status: AC | PRN
  Administered 2011-02-27: 100 mL via INTRAVENOUS

## 2013-06-11 ENCOUNTER — Telehealth: Payer: Self-pay | Admitting: Pulmonary Disease

## 2013-06-13 ENCOUNTER — Institutional Professional Consult (permissible substitution): Payer: BC Managed Care – PPO | Admitting: Pulmonary Disease

## 2013-07-22 ENCOUNTER — Ambulatory Visit (INDEPENDENT_AMBULATORY_CARE_PROVIDER_SITE_OTHER): Payer: BC Managed Care – PPO | Admitting: Pulmonary Disease

## 2013-07-22 ENCOUNTER — Encounter: Payer: Self-pay | Admitting: Pulmonary Disease

## 2013-07-22 VITALS — BP 130/80 | HR 82 | Temp 98.0°F | Ht 72.0 in | Wt 275.2 lb

## 2013-07-22 DIAGNOSIS — G4733 Obstructive sleep apnea (adult) (pediatric): Secondary | ICD-10-CM

## 2013-07-22 NOTE — Progress Notes (Signed)
   Subjective:    Patient ID: Jeffery Wood, male    DOB: Jul 29, 1955, 58 y.o.   MRN: [id number]  HPI The patient is a 58 year old male who I've been asked to see for management of obstructive sleep apnea.  He was apparently diagnosed 10 years ago, and was tried on CPAP with very poor tolerance. He tells me he was tried on all different types of machines, pressure settings, and different masks without success. He could simply not keep the mask on his face. This is gone untreated over the years, and he recently underwent a home sleep test in March of this year which showed moderate OSA. He had an AHI of 24 events per hour with desaturation as low as 85%. The patient states that he is still having loud snoring, and bed partner comments on witnessed apneas.  He has frequent awakenings at night, and is not rested in the mornings upon arising. He has significant daytime sleepiness with any inactivity, and will fall asleep in the evenings watching television or movies. His Epworth score today is 22, and the patient tells that he has lost 30 pounds over the last 2 years.   Review of Systems  Constitutional: Negative for fever and unexpected weight change.  HENT: Positive for dental problem and sneezing. Negative for congestion, ear pain, nosebleeds, postnasal drip, rhinorrhea, sinus pressure, sore throat and trouble swallowing.   Eyes: Negative for redness and itching.  Respiratory: Negative for cough, chest tightness, shortness of breath and wheezing.   Cardiovascular: Negative for palpitations and leg swelling.  Gastrointestinal: Negative for nausea and vomiting.  Genitourinary: Negative for dysuria.  Musculoskeletal: Negative for joint swelling.  Skin: Negative for rash.  Neurological: Negative for headaches.  Hematological: Does not bruise/bleed easily.  Psychiatric/Behavioral: Negative for dysphoric mood. The patient is not nervous/anxious.        Objective:   Physical Exam Constitutional:   Overweight male, no acute distress  HENT:  Nares patent without discharge, but deviated septum to left with narrowing.  Oropharynx without exudate, palate and uvula are elongated with side wall narrowing.  Eyes:  Perrla, eomi, no scleral icterus  Neck:  No JVD, no TMG  Cardiovascular:  Normal rate, regular rhythm, no rubs or gallops.  No murmurs        Intact distal pulses  Pulmonary :  Normal breath sounds, no stridor or respiratory distress   No rales, rhonchi, or wheezing  Abdominal:  Soft, nondistended, bowel sounds present.  No tenderness noted.   Musculoskeletal:  No lower extremity edema noted.  Lymph Nodes:  No cervical lymphadenopathy noted  Skin:  No cyanosis noted  Neurologic:  Alert, appropriate, moves all 4 extremities without obvious deficit.         Assessment & Plan:

## 2013-07-22 NOTE — Assessment & Plan Note (Signed)
The patient has moderate obstructive sleep apnea by his recent home sleep test, and tells me that he is totally CPAP intolerant. He feels that it is not a viable therapy for him. I have discussed with him other treatment options such as a trial of weight loss alone, upper airway surgery, as well as dental appliance. The patient does have abnormal upper airway anatomy, and may respond favorably to surgery. He may also have a good response with a dental appliance. After a long conversation, the patient wishes to pursue a dental appliance, and I will therefore make a referral to dental medicine. I also stressed to him the importance of aggressive weight loss.

## 2013-07-22 NOTE — Patient Instructions (Signed)
Will send a referral to Dr. Ron Parker for dental evaluation for your sleep apnea Work on weight loss.

## 2014-01-07 ENCOUNTER — Encounter (HOSPITAL_COMMUNITY): Payer: Self-pay | Admitting: Cardiology

## 2014-07-14 NOTE — Telephone Encounter (Signed)
error 

## 2017-01-15 ENCOUNTER — Other Ambulatory Visit: Payer: Self-pay

## 2017-01-15 ENCOUNTER — Emergency Department (HOSPITAL_COMMUNITY): Payer: BC Managed Care – PPO

## 2017-01-15 ENCOUNTER — Emergency Department (HOSPITAL_COMMUNITY)
Admission: EM | Admit: 2017-01-15 | Discharge: 2017-01-15 | Discharge status: Against Medical Advice | Payer: BC Managed Care – PPO | Attending: Emergency Medicine | Admitting: Emergency Medicine

## 2017-01-15 ENCOUNTER — Encounter (HOSPITAL_COMMUNITY): Payer: Self-pay | Admitting: Neurology

## 2017-01-15 DIAGNOSIS — R079 Chest pain, unspecified: Secondary | ICD-10-CM | POA: Diagnosis present

## 2017-01-15 DIAGNOSIS — Z5321 Procedure and treatment not carried out due to patient leaving prior to being seen by health care provider: Secondary | ICD-10-CM | POA: Insufficient documentation

## 2017-01-15 DIAGNOSIS — R0789 Other chest pain: Secondary | ICD-10-CM | POA: Diagnosis not present

## 2017-01-15 DIAGNOSIS — R918 Other nonspecific abnormal finding of lung field: Secondary | ICD-10-CM | POA: Insufficient documentation

## 2017-01-15 DIAGNOSIS — R9389 Abnormal findings on diagnostic imaging of other specified body structures: Secondary | ICD-10-CM

## 2017-01-15 LAB — BASIC METABOLIC PANEL
ANION GAP: 9 (ref 5–15)
BUN: 18 mg/dL (ref 6–20)
CHLORIDE: 103 mmol/L (ref 101–111)
CO2: 28 mmol/L (ref 22–32)
Calcium: 9.8 mg/dL (ref 8.9–10.3)
Creatinine, Ser: 0.96 mg/dL (ref 0.61–1.24)
GFR calc non Af Amer: >60 mL/min (ref >60)
Glucose, Bld: 92 mg/dL (ref 65–99)
POTASSIUM: 5.3 mmol/L — AB (ref 3.5–5.1)
Sodium: 140 mmol/L (ref 135–145)

## 2017-01-15 LAB — CBC
HEMATOCRIT: 41.7 % (ref 39.0–52.0)
HEMOGLOBIN: 13.9 g/dL (ref 13.0–17.0)
MCH: 30.6 pg (ref 26.0–34.0)
MCHC: 33.3 g/dL (ref 30.0–36.0)
MCV: 91.9 fL (ref 78.0–100.0)
PLATELETS: 447 10*3/uL — AB (ref 150–400)
RBC: 4.54 MIL/uL (ref 4.22–5.81)
RDW: 13.1 % (ref 11.5–15.5)
WBC: 11.1 10*3/uL — ABNORMAL HIGH (ref 4.0–10.5)

## 2017-01-15 LAB — I-STAT TROPONIN, ED: Troponin i, poc: 0 ng/mL (ref 0.00–0.08)

## 2017-01-15 NOTE — ED Notes (Signed)
Pt ambulated to room from waiting room, tolerated well. Asked pt to undress and place into gown and to be placed on monitor. Pt refused to get in gown and refused to be placed on the monitor. Nurse was notified.

## 2017-01-15 NOTE — ED Triage Notes (Signed)
Pt reports cp x 1 month when got worse, over last week got worse. Went to doctor today, and sent here for abnormal EKG. Reports SOB with any exertion. Denies n/v. The last few nights has woke up in sweat. Denies cardiac hx. Is a x 4.

## 2017-01-15 NOTE — ED Provider Notes (Signed)
[facility] EMERGENCY DEPARTMENT Provider Note   CSN: [id number] Arrival date & time: 01/15/17  1107     History   Chief Complaint Chief Complaint  Patient presents with  . Chest Pain    HPI Jeffery Wood is a 61 y.o. male.   Chest Pain   This is a new problem. Episode onset: 59mo ago. The problem occurs constantly. The problem has been gradually worsening. The pain is associated with rest. The pain is moderate. The quality of the pain is described as sharp. Radiates to: Starts in back, radiates to chest. The symptoms are aggravated by deep breathing and exertion. Associated symptoms include back pain and shortness of breath. Pertinent negatives include no abdominal pain, no cough, no diaphoresis, no dizziness, no fever, no headaches, no hemoptysis, no irregular heartbeat, no near-syncope, no numbness, no palpitations, no vomiting and no weakness. Treatments tried: heating pads. The treatment provided mild relief. Risk factors include male gender and obesity.  His past medical history is significant for diabetes.  Pertinent negatives for past medical history include no arrhythmia, no CAD, no hypertension and no seizures.    Past Medical History:  Diagnosis Date  . Asthma   . Diabetes [facility])     Patient Active Problem List   Diagnosis Date Noted  . OSA (obstructive sleep apnea) 07/22/2013  . Chest pain, non-cardiac 12/07/2010  . Diabetes mellitus 12/07/2010    Past Surgical History:  Procedure Laterality Date  . CARPAL TUNNEL RELEASE    . KNEE ARTHROSCOPY    . LEFT HEART CATHETERIZATION WITH CORONARY ANGIOGRAM N/A 12/07/2010   Procedure: LEFT HEART CATHETERIZATION WITH CORONARY ANGIOGRAM;  Surgeon: Leonie Man, MD;  Location[facility] C[facility] LAB;  Service: Cardiovascular;  Laterality: N/A;       Home Medications    Prior to Admission medications   Medication Sig Start Date End Date Taking? Authorizing Provider  acetaminophen (TYLENOL) 650 MG CR tablet  Take 1,300 mg by mouth 2 (two) times daily as needed. For pain    Yes [provider]  aspirin EC 81 MG tablet Take 81 mg by mouth daily.     Yes [provider]  clonazePAM (KLONOPIN) 0.5 MG tablet Take 0.5 mg by mouth at bedtime.     Yes [provider]  Fluticasone-Salmeterol (ADVAIR) 100-50 MCG/DOSE AEPB Inhale 1 puff into the lungs daily.     Yes [provider]  glimepiride (AMARYL) 4 MG tablet Take 2 mg by mouth 2 (two) times daily.    Yes [provider]  loratadine (CLARITIN) 10 MG tablet Take 10 mg by mouth daily.     Yes [provider]  metFORMIN (GLUCOPHAGE) 1000 MG tablet Take 1,000 mg by mouth 2 (two) times daily with a meal.     Yes [provider]  Multiple Vitamins-Minerals (MULTIVITAMINS THER. W/MINERALS) TABS Take 1 tablet by mouth daily.     Yes [provider]  omeprazole (PRILOSEC) 20 MG capsule Take 20 mg by mouth 2 (two) times daily.     Yes [provider]  OVER THE COUNTER MEDICATION Apply 1 application topically as needed (Inflamed Psorasis). Topical Cream   Yes [provider]  tiotropium (SPIRIVA) 18 MCG inhalation capsule Place 18 mcg into inhaler and inhale daily.   Yes [provider]  traMADol (ULTRAM) 50 MG tablet Take 50 mg by mouth every 8 (eight) hours as needed for moderate pain.   Yes [provider]  trolamine salicylate (  ASPERCREME) 10 % cream Apply 1 application topically as needed for muscle pain.   Yes [provider]  betamethasone dipropionate (DIPROLENE) 0.05 % cream Apply 1 application topically daily.      [provider]  etodolac (LODINE) 400 MG tablet Take 400 mg by mouth 3 (three) times daily as needed. For gout pain     [provider]  ipratropium (ATROVENT HFA) 17 MCG/ACT inhaler Inhale 2 puffs into the lungs every 8 (eight) hours as needed. For shortness of breath     [provider]    Family  History Family History  Problem Relation Age of Onset  . Emphysema Mother   . Asthma Mother   . Heart disease Father     Social History Social History   Tobacco Use  . Smoking status: Former Smoker    Packs/day: 3.00    Years: 20.00    Pack years: 60.00    Types: Cigarettes    Last attempt to quit: 01/31/1988    Years since quitting: 28.9  Substance Use Topics  . Alcohol use: No  . Drug use: No     Allergies   Patient has no known allergies.   Review of Systems Review of Systems  Constitutional: Negative for chills, diaphoresis and fever.  HENT: Negative for rhinorrhea and sore throat.   Eyes: Negative for pain and visual disturbance.  Respiratory: Positive for shortness of breath. Negative for cough and hemoptysis.   Cardiovascular: Positive for chest pain. Negative for palpitations and near-syncope.  Gastrointestinal: Negative for abdominal pain and vomiting.  Genitourinary: Negative for dysuria and hematuria.  Musculoskeletal: Positive for back pain. Negative for arthralgias.  Skin: Negative for color change and rash.  Neurological: Negative for dizziness, seizures, syncope, weakness, numbness and headaches.  All other systems reviewed and are negative.    Physical Exam Updated Vital Signs BP 132/73 (BP Location: Right Arm)   Pulse 95   Temp 99.3 F (37.4 C) (Oral)   Resp 18   Ht 6\' 3"  (1.905 m)   Wt 111.6 kg (246 lb)   SpO2 100%   BMI 30.75 kg/m   Physical Exam  Constitutional: He is oriented to person, place, and time. He appears well-developed and well-nourished.  HENT:  Head: Normocephalic and atraumatic.  Eyes: Conjunctivae are normal.  Neck: Neck supple.  Cardiovascular: Normal rate, regular rhythm and normal pulses.  No murmur heard. Pulmonary/Chest: Effort normal and breath sounds normal. No respiratory distress. He has no wheezes.  Abdominal: Soft. There is no tenderness.  Musculoskeletal: Normal range of motion. He exhibits no edema.    Neurological: He is alert and oriented to person, place, and time.  Skin: Skin is warm and dry.  Psychiatric: He has a normal mood and affect.  Nursing note and vitals reviewed.    ED Treatments / Results  Labs (all labs ordered are listed, but only abnormal results are displayed) Labs Reviewed  BASIC METABOLIC PANEL - Abnormal; Notable for the following components:      Result Value   Potassium 5.3 (*)    All other components within normal limits  CBC - Abnormal; Notable for the following components:   WBC 11.1 (*)    Platelets 447 (*)    All other components within normal limits  I-STAT TROPONIN, ED    EKG  EKG Interpretation  Date/Time:  Monday January 15 2017 11:12:08 EST Ventricular Rate:  102 PR Interval:  170 QRS Duration: 84 QT Interval:  330 QTC  Calculation: 430 R Axis:   65 Text Interpretation:  Sinus tachycardia Possible Left atrial enlargement Low voltage QRS Cannot rule out Anterior infarct , age undetermined Abnormal ECG Confirmed by Pattricia Boss [phone]) on 01/15/2017 3:36:50 PM       Radiology Dg Chest 2 View  Result Date: 01/15/2017 CLINICAL DATA:  Chest pain x1 month, shortness of breath with exertion, abnormal EKG EXAM: CHEST  2 VIEW COMPARISON:  12/01/2010 FINDINGS: Medial right upper lobe/ perihilar mass. Associated mild right upper lobe opacity. Bilateral nipple shadows. No pleural effusion or pneumothorax. The heart is normal in size. Degenerative changes of the visualized thoracolumbar spine. IMPRESSION: Medial right upper lobe/perihilar mass. Associated mild right upper lobe opacity. CT chest with contrast is suggested for further evaluation. Electronically Signed   By: Julian Hy M.D.   On: 01/15/2017 11:51    Procedures Procedures (including critical care time)  Medications Ordered in ED Medications - No data to display   Initial Impression / Assessment and Plan / ED Course  I have reviewed the triage vital signs and the nursing  notes.  Pertinent labs & imaging results that were available during my care of the patient were reviewed by me and considered in my medical decision making (see chart for details).     Pt with h/o asthma, DM presents with CP & SOB. Says he's had the pain for approximately 88mo, but over the last week it has worsened. Describes the pain as sharp, originating between his scapulas w/radation to his chest b/l, worsened by deep inspiration, and associated with DOE. Says he's used a heating pad at home for the pain, which has provided moderate relief. Seen at his PCP's today and told to come here after an EKG was reportedly "different" than the last one he had at the office. Says he quick smoking in 1990.  VS & exam as above. Labs remarkable for WBC 11.1, K 5.3. CXR w/medial RUL/perihilar mass.  Explained CXR results to the Pt and recommended chest CT for further evaluation.  @[id number]  Nurses alerted me that the Pt eloped. He was waiting for his CT scan, and decided that he had been here for too long & didn't want to stay any longer.  Final Clinical Impressions(s) / ED Diagnoses   Final diagnoses:  Abnormal x-ray  Other chest pain    ED Discharge Orders    None       Jenny Reichmann, MD 01/15/17 [place]    Pattricia Boss, MD 01/16/17 [phone]

## 2017-01-15 NOTE — ED Notes (Signed)
Pt came to desk stating he was leaving. States he has been here for 5 hours with nothing happening. States he'd be willing to come back if they could tell him a specific time for CT Scan. Called CT and they states if we start an IV now they can take pt now. Informed pt of this and he states 'I'd rather just leave.' Advised pt to see his PCP. He denies having cp or further discomfort. Ambulatory out of dept

## 2017-01-18 ENCOUNTER — Telehealth: Payer: Self-pay | Admitting: Oncology

## 2017-01-18 ENCOUNTER — Encounter: Payer: Self-pay | Admitting: *Deleted

## 2017-01-18 DIAGNOSIS — R0602 Shortness of breath: Secondary | ICD-10-CM

## 2017-01-18 NOTE — Telephone Encounter (Signed)
Confirmed CT appointment with patient, advised liquids only 4 hours prior.

## 2017-01-19 ENCOUNTER — Encounter (HOSPITAL_COMMUNITY): Payer: Self-pay

## 2017-01-19 ENCOUNTER — Encounter: Payer: Self-pay | Admitting: Oncology

## 2017-01-19 ENCOUNTER — Ambulatory Visit (HOSPITAL_BASED_OUTPATIENT_CLINIC_OR_DEPARTMENT_OTHER): Payer: BC Managed Care – PPO | Admitting: Oncology

## 2017-01-19 ENCOUNTER — Ambulatory Visit (HOSPITAL_COMMUNITY)
Admission: RE | Admit: 2017-01-19 | Discharge: 2017-01-19 | Disposition: A | Discharge status: Home / Self Care | Payer: BC Managed Care – PPO | Source: Ambulatory Visit | Attending: Oncology | Admitting: Oncology

## 2017-01-19 ENCOUNTER — Telehealth: Payer: Self-pay | Admitting: Oncology

## 2017-01-19 VITALS — BP 138/72 | HR 97 | Temp 97.9°F | Resp 20 | Ht 75.0 in | Wt 253.3 lb

## 2017-01-19 DIAGNOSIS — R590 Localized enlarged lymph nodes: Secondary | ICD-10-CM | POA: Insufficient documentation

## 2017-01-19 DIAGNOSIS — R918 Other nonspecific abnormal finding of lung field: Secondary | ICD-10-CM

## 2017-01-19 DIAGNOSIS — M545 Low back pain: Secondary | ICD-10-CM

## 2017-01-19 DIAGNOSIS — R0602 Shortness of breath: Secondary | ICD-10-CM | POA: Insufficient documentation

## 2017-01-19 MED ORDER — IOPAMIDOL (ISOVUE-300) INJECTION 61%
INTRAVENOUS | Status: AC
  Filled 2017-01-19: qty 75

## 2017-01-19 MED ORDER — IOPAMIDOL (ISOVUE-300) INJECTION 61%
75.0000 mL | Freq: Once | INTRAVENOUS | Status: AC | PRN
  Administered 2017-01-19: 75 mL via INTRAVENOUS

## 2017-01-19 NOTE — Progress Notes (Signed)
[facility] New Patient Consult   Referring MD: Levin Erp, Md [address], [address] [place], [place] [address]   Jeffery Wood 61 y.o.  [birth year]    Reason for Referral: Lung mass   HPI: Jeffery Wood reports a history of pain between the shoulder blades radiating to the anterior chest for the past 1-1-1/2 months.  He has associated exertional dyspnea.  He presented to the [facility] room 01/15/2017.  A chest x-ray revealed a medial right upper lobe/perihilar mass.  He left the emergency room prior to a scheduled chest CT.  He reports the pain and dyspnea have improved over the past several days.  Past Medical History:  Diagnosis Date  . Asthma   . Diabetes ([place])     .  Gout   .  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 CARPAL TUNNEL RELEASE    . KNEE ARTHROSCOPY    . LEFT HEART CATHETERIZATION WITH CORONARY ANGIOGRAM N/A 12/07/2010   Procedure: LEFT HEART CATHETERIZATION WITH CORONARY ANGIOGRAM;  Surgeon: Leonie Man, MD;  Location: [facility] CATH LAB;  Service: Cardiovascular;  Laterality: N/A;     .  Pilonidal cyst surgery    .  Left biceps surgery    .  Bilateral knee arthroscopy Medications: Reviewed  Allergies: No Known Allergies  Family history: Family history of cancer.  He had 3 brothers.  Social History:   He lives in [place].  He is a retired Dealer.  He quit smoking cigarettes in 1990.  He quit alcohol in 1990.  No transfusion history.  No risk factor for HIV or hepatitis.  ROS:   Positives include: Anorexia, 50 pound weight loss over the past 6 months, constipation when taking tramadol, decreased urinary stream, dyspnea on exertion-improved over the past several days, pain at the mid upper back radiating to  the right greater than left anterior chest, headaches at night for the past year-improved  A complete ROS was otherwise negative.  Physical Exam:  Blood pressure 138/72, pulse 97, temperature 97.9 F (36.6 C), temperature source Oral, resp. rate 20, height 6\' 3"  (1.905 m), weight 253 lb 4.8 oz (114.9 kg), SpO2 100 %.  HEENT: Oropharynx without visible mass, neck without mass, no facial edema Lungs: Clear bilaterally, no respiratory distress Cardiac: Regular rate and rhythm, no JVD Abdomen: No hepatosplenomegaly, nontender GU: Testes without mass Vascular: No leg edema Lymph nodes: No cervical, supraclavicular, axillary, or inguinal nodes Neurologic: Alert and oriented, the motor exam appears intact in the upper and lower extremities Skin: No rash Musculoskeletal: No spine tenderness   LAB:  CBC  Lab Results  Component Value Date   WBC 11.1 (H) 01/15/2017   HGB 13.9 01/15/2017   HCT 41.7 01/15/2017   MCV 91.9 01/15/2017   PLT 447 (H) 01/15/2017        CMP     Component Value Date/Time   NA 140 01/15/2017 1310   K 5.3 (H) 01/15/2017 1310   CL 103 01/15/2017 1310   CO2 28 01/15/2017 1310   GLUCOSE 92 01/15/2017 1310   BUN 18 01/15/2017 1310   CREATININE 0.96 01/15/2017 1310   CALCIUM 9.8 01/15/2017 1310   GFRNONAA >60 01/15/2017 1310   GFRAA >60 01/15/2017 1310  Imaging:  Ct Chest W Contrast  Result Date: 01/19/2017 CLINICAL DATA:  Abnormal chest radiograph. History of carcinoid tumor. Chest pain scapular pain. EXAM: CT CHEST WITH CONTRAST TECHNIQUE: Multidetector CT imaging of the chest was performed during intravenous contrast administration. CONTRAST:  104mL ISOVUE-300 IOPAMIDOL (ISOVUE-300) INJECTION 61% COMPARISON:  Chest radiograph 01/15/2017, CT abdomen 08/31/2005 FINDINGS: Cardiovascular: Mediastinal mass compresses the superior vena cava from the level the brachycephalic vein to the RIGHT atrium. The aorta and its branches are normal. Trace pericardial  thickening. Mediastinum/Nodes: Bulky mediastinal mass centered along the superior vena cava measures 10 x 8.0 cm (image 58, series 2). Subcarinal lymph node measures 1.9 cm short axis. RIGHT hilar adenopathy is contiguous with the mediastinal mass. Lungs/Pleura: RIGHT suprahilar mass obstructs the RIGHT upper lobe bronchus with obstructive collapse. Mass is contiguous with the mediastinal mass described above. Triangular nodule in the lateral RIGHT upper lobe may represent post obstructive collapse or tumor spread Upper Abdomen: Limited view of the liver, kidneys, pancreas are unremarkable. Normal adrenal glands. Musculoskeletal: No aggressive osseous lesion. IMPRESSION: 1. RIGHT suprahilar mass which obstructs the RIGHT upper lobe bronchus and extends into the right mediastinum consistent with bronchogenic carcinoma. Consider small cell carcinoma. 2. Mass surrounds and constricts the superior vena cava from the level of the brachycephalic vein to the RIGHT atrium. 3. Subcarinal adenopathy. 4. No evidence of distant metastatic disease on CT thorax. Consider FDG PET scan for further staging. Electronically Signed   By: Suzy Bouchard M.D.   On: 01/19/2017 16:02   CT images reviewed with Jeffery Wood and his wife   Assessment/Plan:   1. Right suprahilar mass with obstruction of the right upper lobe bronchus and extension to the right mediastinum  CT chest 01/19/2017-mass constricting the superior vena cava, subcarinal adenopathy, right suprahilar/mediastinal mass 2. Chest/upper back pain-likely secondary to #1 3. Exertional dyspnea secondary to #1 and COPD 4. Asthma 5. Diabetes 6. Gout 7. Remote history of "carcinoid syndrome " 8. Remote history of tobacco use 9. Basal cell carcinoma removed from the right face 10. Chronic low back pain, status post epidural steroid injections-followed at a pain management clinic   Disposition:   Jeffery Wood presents with a right central lung/mediastinal mass.   There is obstruction of the right upper lobe bronchus and compression of the superior vena cava.  I reviewed the CT images and discussed the differential diagnosis to Mr. Bufano. The mass most likely represents lung cancer.  The central location and tumor bulk suggest the possibility of small cell carcinoma.  We will make an urgent referral to thoracic surgery for a diagnostic bronchoscopy.  Mr. Mcnellis will return for an office visit after the bronchoscopy to make a treatment plan.  He will seek medical attention for increased dyspnea or facial/arm swelling.  He will be scheduled for an office visit at the Cancer center 01/31/2017.  50 minutes were spent with the patient today.  The majority of the time was used for counseling and coordination of care.  Betsy Coder, MD  01/19/2017, 4:09 PM

## 2017-01-19 NOTE — Telephone Encounter (Signed)
Scheduled appt per 12/21 los - Patient aware of appts - did not want AVS or calender printed.

## 2017-01-20 ENCOUNTER — Encounter: Payer: Self-pay | Admitting: *Deleted

## 2017-01-20 DIAGNOSIS — R918 Other nonspecific abnormal finding of lung field: Secondary | ICD-10-CM

## 2017-01-20 NOTE — Progress Notes (Signed)
Oncology Nurse Navigator Documentation  Oncology Nurse Navigator Flowsheets 01/20/2017  Navigator Location [facility]  Referral date to RadOnc/MedOnc 01/20/2017  Navigator Encounter Type Other/I received an update from Dr. Benay Spice. I completed T surgery referral. I will also in-basket their office with an update to help expedite appt.   Treatment Phase Abnormal Scans  Barriers/Navigation Needs Coordination of Care  Interventions Coordination of Care  Coordination of Care Other  Acuity Level 1  Time Spent with Patient 15

## 2017-01-22 ENCOUNTER — Other Ambulatory Visit: Payer: Self-pay

## 2017-01-22 ENCOUNTER — Ambulatory Visit: Payer: BC Managed Care – PPO | Admitting: Oncology

## 2017-01-22 ENCOUNTER — Institutional Professional Consult (permissible substitution): Payer: BC Managed Care – PPO | Admitting: Cardiothoracic Surgery

## 2017-01-22 ENCOUNTER — Encounter: Payer: Self-pay | Admitting: Cardiothoracic Surgery

## 2017-01-22 VITALS — BP 114/73 | HR 106 | Ht 75.0 in | Wt 244.0 lb

## 2017-01-22 DIAGNOSIS — R918 Other nonspecific abnormal finding of lung field: Secondary | ICD-10-CM

## 2017-01-22 DIAGNOSIS — J9859 Other diseases of mediastinum, not elsewhere classified: Secondary | ICD-10-CM

## 2017-01-22 NOTE — Progress Notes (Signed)
[address]       [place] [address]             [phone]                    Jeffery [place] [facility] Medical Record #[id number] Date of Birth: 08-13-1955  Referring: Ladell Pier, MD Primary Care: Levin Erp, MD  Chief Complaint:    Chief Complaint  Patient presents with  . New Patient (Initial Visit)    Hilar Mass, SOB, chest pain    History of Present Illness:    Jeffery Wood 61 y.o. male is seen in the office 1-1/2 months increasing shortness of breath and chest discomfort with deep breath. Patient is previous smoker quit in 1990 . He has had increased wheezing, night sweats . No weight loss . 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  Current Activity/ Functional Status:  Patient is independent with mobility/ambulation, transfers, ADL's, IADL's.   Zubrod Score: At the time of surgery this patient's most appropriate activity status/level should be described as: []     0    Normal activity, no symptoms [x]     1    Restricted in physical strenuous activity but ambulatory, able to do out light work []     2    Ambulatory and capable of self care, unable to do work activities, up and about               >50 % of waking hours                              []     3    Only limited self care, in bed greater than 50% of waking hours []     4    Completely disabled, no self care, confined to bed or chair []     5    Moribund   Past Medical History:  Diagnosis Date  . Asthma   . Diabetes [facility])     Past Surgical History:  Procedure Laterality Date  . CARPAL TUNNEL RELEASE    . KNEE ARTHROSCOPY    . LEFT HEART CATHETERIZATION WITH CORONARY ANGIOGRAM N/A 12/07/2010   Procedure: LEFT HEART CATHETERIZATION WITH CORONARY ANGIOGRAM;  Surgeon: Leonie Man, MD;  Location: [facility] CATH LAB;  Service: Cardiovascular;  Laterality: N/A;    Family History  Problem Relation Age of Onset  . Emphysema  Mother   . Asthma Mother   . Heart disease Father     Social History   Socioeconomic History  . Marital status: Married    Spouse name: Not on file  . Number of children: Not on file  . Years of education: Not on file  . Highest education level: Not on file  Social Needs  . Financial resource strain: Not on file  . Food insecurity - worry: Not on file  . Food insecurity - inability: Not on file  . Transportation needs - medical: Not on file  . Transportation needs - non-medical: Not on file  Occupational History  . 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 Smoking status: Former Smoker    Packs/day: 3.00    Years: 20.00    Pack years: 60.00    Types: Cigarettes    Last attempt to quit:  01/31/1988    Years since quitting: 28.9  . Smokeless tobacco: Never Used  Substance and Sexual Activity  . Alcohol use: No  . Drug use: No  . Sexual activity: Not on file  Other Topics Concern  . Not on file  Social History Narrative  . Not on file    Social History   Tobacco Use  Smoking Status Former Smoker  . Packs/day: 3.00  . Years: 20.00  . Pack years: 60.00  . Types: Cigarettes  . Last attempt to quit: 01/31/1988  . Years since quitting: 28.9  Smokeless Tobacco Never Used    Social History   Substance and Sexual Activity  Alcohol Use No     No Known Allergies  Current Outpatient Medications  Medication Sig Dispense Refill  . acetaminophen (TYLENOL) 650 MG CR tablet Take 1,300 mg by mouth 2 (two) times daily as needed. For pain     . aspirin EC 81 MG tablet Take 81 mg by mouth daily.      . betamethasone dipropionate (DIPROLENE) 0.05 % cream Apply 1 application topically daily.      . celecoxib (CELEBREX) 200 MG capsule Take 200 mg by mouth 2 (two) times daily.    . clonazePAM (KLONOPIN) 0.5 MG tablet Take 0.5 mg by mouth at bedtime.      Marland Kitchen etodolac (LODINE) 400 MG tablet Take 400 mg by mouth 3 (three) times daily as needed.  For gout pain     . Fluticasone-Salmeterol (ADVAIR) 100-50 MCG/DOSE AEPB Inhale 1 puff into the lungs daily.      Marland Kitchen glimepiride (AMARYL) 4 MG tablet Take 2 mg by mouth 2 (two) times daily.     Marland Kitchen levalbuterol (XOPENEX HFA) 45 MCG/ACT inhaler as needed.    . loratadine (CLARITIN) 10 MG tablet Take 10 mg by mouth daily.      Marland Kitchen losartan (COZAAR) 100 MG tablet Take 100 mg by mouth daily.    . metFORMIN (GLUCOPHAGE) 1000 MG tablet Take 1,000 mg by mouth 2 (two) times daily with a meal.      . Multiple Vitamins-Minerals (MULTIVITAMINS THER. W/MINERALS) TABS Take 1 tablet by mouth daily.      Marland Kitchen omeprazole (PRILOSEC) 20 MG capsule Take 20 mg by mouth 2 (two) times daily.      Marland Kitchen OVER THE COUNTER MEDICATION Apply 1 application topically as needed (Inflamed Psorasis). Topical Cream    . tiotropium (SPIRIVA) 18 MCG inhalation capsule Place 18 mcg into inhaler and inhale daily.    . traMADol (ULTRAM) 50 MG tablet Take 50 mg by mouth every 8 (eight) hours as needed for moderate pain.    Marland Kitchen trolamine salicylate (ASPERCREME) 10 % cream Apply 1 application topically as needed for muscle pain.     No current facility-administered medications for this visit.     Pertinent items are noted in HPI.   Review of Systems:     Cardiac Review of Systems: [Y] = yes  or   [  ] = no   Chest Pain [  y  ]  Resting SOB [ y] Exertional SOB  [ y ]  Vertell Limber Florencio.Farrier ]   Pedal Edema [n  ]    Palpitations Florencio.Farrier  ] Syncope  Florencio.Farrier ]   Presyncope [ n  ]  General Review of Systems: [Y] = yes [  ]=no Constitional: recent weight change [ y ];  Wt loss over the last 3 months [ 5  ] anorexia [  ];  fatigue [ y ]; nausea [  ]; night sweats Blue.Reese  ]; fever [n ]; or chills [  ];          Dental: poor dentition[  ]; Last Dentist visit:   Eye : blurred vision [  ]; diplopia [   ]; vision changes [  ];  Amaurosis fugax[  ]; Resp: cough [ y ];  wheezing[y  ];  hemoptysis[n ]; shortness of breath[ y ]; paroxysmal nocturnal dyspnea[ y ]; dyspnea on exertion[   ]; or orthopnea[  ];  GI:  gallstones[  ], vomiting[n  ];  dysphagia[  ]; melena[  ];  hematochezia [  ]; heartburn[  ];   Hx of  Colonoscopy[y  ]; GU: kidney stones [  ]; hematuria[  ];   dysuria [  ];  nocturia[  ];  history of     obstruction [  ]; urinary frequency [  ]             Skin: rash, swelling[  ];, hair loss[  ];  peripheral edema[  ];  or itching[  ]; Musculosketetal: myalgias[  ];  joint swelling[  ];  joint erythema[  ];  joint pain[  ];  back pain[  ];  Heme/Lymph: bruising[  ];  bleeding[  ];  anemia[  ];  Neuro: TIA[n ];  headaches[  ];  stroke[  ];  vertigo[  ];  seizures[  ];   paresthesias[  ];  difficulty walking[  ];  Psych:depression[ n ]; anxiety[  ];  Endocrine: diabetes[y   PHYSICAL EXAMINATION: General appearance: alert, cooperative, appears stated age and no distress Head: Normocephalic, without obvious abnormality, atraumatic Neck: no adenopathy, no carotid bruit, no JVD, supple, symmetrical, trachea midline and thyroid not enlarged, symmetric, no tenderness/mass/nodules Lymph nodes: Cervical, supraclavicular, and axillary nodes normal. Resp: diminished breath sounds RUL Back: negative, symmetric, no curvature. ROM normal. No CVA tenderness. Cardio: regular rate and rhythm, S1, S2 normal, no murmur, click, rub or gallop GI: soft, non-tender; bowel sounds normal; no masses,  no organomegaly Extremities: extremities normal, atraumatic, no cyanosis or edema and Homans sign is negative, no sign of DVT Neurologic: Grossly normal  Diagnostic Studies & Laboratory data:     Recent Radiology Findings:   Dg Chest 2 View  Result Date: 01/15/2017 CLINICAL DATA:  Chest pain x1 month, shortness of breath with exertion, abnormal EKG EXAM: CHEST  2 VIEW COMPARISON:  12/01/2010 FINDINGS: Medial right upper lobe/ perihilar mass. Associated mild right upper lobe opacity. Bilateral nipple shadows. No pleural effusion or pneumothorax. The heart is normal in size. Degenerative  changes of the visualized thoracolumbar spine. IMPRESSION: Medial right upper lobe/perihilar mass. Associated mild right upper lobe opacity. CT chest with contrast is suggested for further evaluation. Electronically Signed   By: Julian Hy M.D.   On: 01/15/2017 11:51   Ct Chest W Contrast  Result Date: 01/19/2017 CLINICAL DATA:  Abnormal chest radiograph. History of carcinoid tumor. Chest pain scapular pain. EXAM: CT CHEST WITH CONTRAST TECHNIQUE: Multidetector CT imaging of the chest was performed during intravenous contrast administration. CONTRAST:  69mL ISOVUE-300 IOPAMIDOL (ISOVUE-300) INJECTION 61% COMPARISON:  Chest radiograph 01/15/2017, CT abdomen 08/31/2005 FINDINGS: Cardiovascular: Mediastinal mass compresses the superior vena cava from the level the brachycephalic vein to the RIGHT atrium. The aorta and its branches are normal. Trace pericardial thickening. Mediastinum/Nodes: Bulky mediastinal mass centered along the superior vena cava measures 10 x 8.0 cm (image 58, series 2). Subcarinal lymph node  measures 1.9 cm short axis. RIGHT hilar adenopathy is contiguous with the mediastinal mass. Lungs/Pleura: RIGHT suprahilar mass obstructs the RIGHT upper lobe bronchus with obstructive collapse. Mass is contiguous with the mediastinal mass described above. Triangular nodule in the lateral RIGHT upper lobe may represent post obstructive collapse or tumor spread Upper Abdomen: Limited view of the liver, kidneys, pancreas are unremarkable. Normal adrenal glands. Musculoskeletal: No aggressive osseous lesion. IMPRESSION: 1. RIGHT suprahilar mass which obstructs the RIGHT upper lobe bronchus and extends into the right mediastinum consistent with bronchogenic carcinoma. Consider small cell carcinoma. 2. Mass surrounds and constricts the superior vena cava from the level of the brachycephalic vein to the RIGHT atrium. 3. Subcarinal adenopathy. 4. No evidence of distant metastatic disease on CT thorax.  Consider FDG PET scan for further staging. Electronically Signed   By: Suzy Bouchard M.D.   On: 01/19/2017 16:02   I have independently reviewed the above radiology studies  and reviewed the findings with the patient.   Recent Lab Findings: Lab Results  Component Value Date   WBC 11.1 (H) 01/15/2017   HGB 13.9 01/15/2017   HCT 41.7 01/15/2017   PLT 447 (H) 01/15/2017   GLUCOSE 92 01/15/2017   NA 140 01/15/2017   K 5.3 (H) 01/15/2017   CL 103 01/15/2017   CREATININE 0.96 01/15/2017   BUN 18 01/15/2017   CO2 28 01/15/2017   HGBA1C 5.7 (H) 12/07/2010      Assessment / Plan:   Extensive mediastinal involvement of mediastinal mass suggestive of small cell lung cancer, with svc involvement  And obstruction of right upper lobe bronchus   Diabetes Gout Asthma  I ave reviewed with patient the xray findings and recommended proceed quickly with bronchoscopy, ebus poss mediastinoscopy to obtain tissue dx. He will also need PET and MRI of the brain for further staging. Plan Dec26.  I  spent 40 minutes counseling the patient face to face and 50% or more the  time was spent in counseling and coordination of care.   Grace Isaac MD      [address] [place] [address] Office [phone]   Beeper [phone]  01/22/2017 12:26 PM

## 2017-01-24 ENCOUNTER — Ambulatory Visit (HOSPITAL_COMMUNITY)
Admission: RE | Admit: 2017-01-24 | Discharge: 2017-01-24 | Disposition: A | Discharge status: Home / Self Care | Payer: BC Managed Care – PPO | Source: Ambulatory Visit | Attending: Cardiothoracic Surgery | Admitting: Cardiothoracic Surgery

## 2017-01-24 ENCOUNTER — Encounter (HOSPITAL_COMMUNITY): Payer: Self-pay | Admitting: Certified Registered Nurse Anesthetist

## 2017-01-24 ENCOUNTER — Ambulatory Visit (HOSPITAL_COMMUNITY): Payer: BC Managed Care – PPO | Admitting: Anesthesiology

## 2017-01-24 ENCOUNTER — Other Ambulatory Visit: Payer: Self-pay

## 2017-01-24 ENCOUNTER — Encounter (HOSPITAL_COMMUNITY)
Admission: RE | Disposition: A | Discharge status: Home / Self Care | Payer: Self-pay | Source: Ambulatory Visit | Attending: Cardiothoracic Surgery

## 2017-01-24 ENCOUNTER — Ambulatory Visit (HOSPITAL_COMMUNITY): Payer: BC Managed Care – PPO

## 2017-01-24 DIAGNOSIS — K219 Gastro-esophageal reflux disease without esophagitis: Secondary | ICD-10-CM | POA: Insufficient documentation

## 2017-01-24 DIAGNOSIS — R918 Other nonspecific abnormal finding of lung field: Secondary | ICD-10-CM | POA: Insufficient documentation

## 2017-01-24 DIAGNOSIS — J45909 Unspecified asthma, uncomplicated: Secondary | ICD-10-CM | POA: Diagnosis not present

## 2017-01-24 DIAGNOSIS — G4730 Sleep apnea, unspecified: Secondary | ICD-10-CM | POA: Insufficient documentation

## 2017-01-24 DIAGNOSIS — J850 Gangrene and necrosis of lung: Secondary | ICD-10-CM | POA: Diagnosis not present

## 2017-01-24 DIAGNOSIS — I10 Essential (primary) hypertension: Secondary | ICD-10-CM | POA: Diagnosis not present

## 2017-01-24 DIAGNOSIS — Z79899 Other long term (current) drug therapy: Secondary | ICD-10-CM | POA: Insufficient documentation

## 2017-01-24 DIAGNOSIS — Z87891 Personal history of nicotine dependence: Secondary | ICD-10-CM | POA: Diagnosis not present

## 2017-01-24 DIAGNOSIS — R590 Localized enlarged lymph nodes: Secondary | ICD-10-CM | POA: Insufficient documentation

## 2017-01-24 DIAGNOSIS — E119 Type 2 diabetes mellitus without complications: Secondary | ICD-10-CM | POA: Insufficient documentation

## 2017-01-24 DIAGNOSIS — M109 Gout, unspecified: Secondary | ICD-10-CM | POA: Diagnosis not present

## 2017-01-24 DIAGNOSIS — R222 Localized swelling, mass and lump, trunk: Secondary | ICD-10-CM | POA: Diagnosis not present

## 2017-01-24 HISTORY — DX: Headache, unspecified: R51.9

## 2017-01-24 HISTORY — DX: Malignant (primary) neoplasm, unspecified: C80.1

## 2017-01-24 HISTORY — DX: Essential (primary) hypertension: I10

## 2017-01-24 HISTORY — DX: Gastro-esophageal reflux disease without esophagitis: K21.9

## 2017-01-24 HISTORY — DX: Unspecified osteoarthritis, unspecified site: M19.90

## 2017-01-24 HISTORY — PX: VIDEO BRONCHOSCOPY WITH ENDOBRONCHIAL ULTRASOUND: SHX6177

## 2017-01-24 HISTORY — DX: Sleep apnea, unspecified: G47.30

## 2017-01-24 HISTORY — DX: Headache: R51

## 2017-01-24 LAB — CBC
HCT: 40.5 % (ref 39.0–52.0)
Hemoglobin: 13.4 g/dL (ref 13.0–17.0)
MCH: 29.9 pg (ref 26.0–34.0)
MCHC: 33.1 g/dL (ref 30.0–36.0)
MCV: 90.4 fL (ref 78.0–100.0)
Platelets: 365 10*3/uL (ref 150–400)
RBC: 4.48 MIL/uL (ref 4.22–5.81)
RDW: 13.4 % (ref 11.5–15.5)
WBC: 11 10*3/uL — ABNORMAL HIGH (ref 4.0–10.5)

## 2017-01-24 LAB — ABO/RH: ABO/RH(D): O POS

## 2017-01-24 LAB — COMPREHENSIVE METABOLIC PANEL
ALT: 13 U/L — ABNORMAL LOW (ref 17–63)
AST: 16 U/L (ref 15–41)
Albumin: 3.1 g/dL — ABNORMAL LOW (ref 3.5–5.0)
Alkaline Phosphatase: 52 U/L (ref 38–126)
Anion gap: 9 (ref 5–15)
BUN: 13 mg/dL (ref 6–20)
CO2: 26 mmol/L (ref 22–32)
Calcium: 9.2 mg/dL (ref 8.9–10.3)
Chloride: 101 mmol/L (ref 101–111)
Creatinine, Ser: 0.73 mg/dL (ref 0.61–1.24)
GFR calc Af Amer: >60 mL/min (ref >60)
GFR calc non Af Amer: >60 mL/min (ref >60)
Glucose, Bld: 95 mg/dL (ref 65–99)
Potassium: 4.3 mmol/L (ref 3.5–5.1)
Sodium: 136 mmol/L (ref 135–145)
Total Bilirubin: 0.6 mg/dL (ref 0.3–1.2)
Total Protein: 6.3 g/dL — ABNORMAL LOW (ref 6.5–8.1)

## 2017-01-24 LAB — HEMOGLOBIN A1C
Hgb A1c MFr Bld: 5.3 % (ref 4.8–5.6)
MEAN PLASMA GLUCOSE: 105.41 mg/dL

## 2017-01-24 LAB — TYPE AND SCREEN
ABO/RH(D): O POS
Antibody Screen: NEGATIVE

## 2017-01-24 LAB — GLUCOSE, CAPILLARY
Glucose-Capillary: 93 mg/dL (ref 65–99)
Glucose-Capillary: 160 mg/dL — ABNORMAL HIGH (ref 65–99)

## 2017-01-24 LAB — APTT: aPTT: 31 seconds (ref 24–36)

## 2017-01-24 LAB — PROTIME-INR
INR: 1.09
Prothrombin Time: 14 seconds (ref 11.4–15.2)

## 2017-01-24 SURGERY — BRONCHOSCOPY, WITH EBUS
Anesthesia: General

## 2017-01-24 MED ORDER — PROPOFOL 10 MG/ML IV BOLUS
INTRAVENOUS | Status: DC | PRN
  Administered 2017-01-24: 200 mg via INTRAVENOUS
  Administered 2017-01-24: 20 mg via INTRAVENOUS

## 2017-01-24 MED ORDER — SUCCINYLCHOLINE CHLORIDE 200 MG/10ML IV SOSY
PREFILLED_SYRINGE | INTRAVENOUS | Status: AC
  Filled 2017-01-24: qty 10

## 2017-01-24 MED ORDER — 0.9 % SODIUM CHLORIDE (POUR BTL) OPTIME
TOPICAL | Status: DC | PRN
  Administered 2017-01-24: 1000 mL

## 2017-01-24 MED ORDER — LACTATED RINGERS IV SOLN
INTRAVENOUS | Status: DC | PRN
  Administered 2017-01-24: 07:00:00 via INTRAVENOUS

## 2017-01-24 MED ORDER — DEXAMETHASONE SODIUM PHOSPHATE 10 MG/ML IJ SOLN
INTRAMUSCULAR | Status: AC
  Filled 2017-01-24: qty 1

## 2017-01-24 MED ORDER — KETOROLAC TROMETHAMINE 30 MG/ML IJ SOLN: 30.0000 mg | Freq: Once | INTRAMUSCULAR | Status: DC | PRN

## 2017-01-24 MED ORDER — PHENYLEPHRINE 40 MCG/ML (10ML) SYRINGE FOR IV PUSH (FOR BLOOD PRESSURE SUPPORT)
PREFILLED_SYRINGE | INTRAVENOUS | Status: AC
  Filled 2017-01-24: qty 10

## 2017-01-24 MED ORDER — PROMETHAZINE HCL 25 MG/ML IJ SOLN: 6.2500 mg | INTRAMUSCULAR | Status: DC | PRN

## 2017-01-24 MED ORDER — PROPOFOL 10 MG/ML IV BOLUS
INTRAVENOUS | Status: AC
  Filled 2017-01-24: qty 40

## 2017-01-24 MED ORDER — ONDANSETRON HCL 4 MG/2ML IJ SOLN
INTRAMUSCULAR | Status: DC | PRN
  Administered 2017-01-24: 4 mg via INTRAVENOUS

## 2017-01-24 MED ORDER — SUGAMMADEX SODIUM 200 MG/2ML IV SOLN
INTRAVENOUS | Status: DC | PRN
  Administered 2017-01-24: 442.8 mg via INTRAVENOUS

## 2017-01-24 MED ORDER — EPINEPHRINE PF 1 MG/ML IJ SOLN
INTRAMUSCULAR | Status: DC | PRN
  Administered 2017-01-24: 1 mg

## 2017-01-24 MED ORDER — PHENYLEPHRINE HCL 10 MG/ML IJ SOLN
INTRAVENOUS | Status: DC | PRN
  Administered 2017-01-24: 40 ug/min via INTRAVENOUS

## 2017-01-24 MED ORDER — MIDAZOLAM HCL 2 MG/2ML IJ SOLN
INTRAMUSCULAR | Status: AC
  Filled 2017-01-24: qty 2

## 2017-01-24 MED ORDER — LIDOCAINE HCL (CARDIAC) 20 MG/ML IV SOLN
INTRAVENOUS | Status: DC | PRN
  Administered 2017-01-24: 60 mg via INTRAVENOUS

## 2017-01-24 MED ORDER — FENTANYL CITRATE (PF) 250 MCG/5ML IJ SOLN
INTRAMUSCULAR | Status: AC
  Filled 2017-01-24: qty 5

## 2017-01-24 MED ORDER — DEXTROSE 5 % IV SOLN
INTRAVENOUS | Status: AC
  Filled 2017-01-24: qty 1.5

## 2017-01-24 MED ORDER — LIDOCAINE 2% (20 MG/ML) 5 ML SYRINGE
INTRAMUSCULAR | Status: AC
  Filled 2017-01-24: qty 5

## 2017-01-24 MED ORDER — MIDAZOLAM HCL 5 MG/5ML IJ SOLN
INTRAMUSCULAR | Status: DC | PRN
  Administered 2017-01-24: 2 mg via INTRAVENOUS

## 2017-01-24 MED ORDER — SUGAMMADEX SODIUM 500 MG/5ML IV SOLN
INTRAVENOUS | Status: AC
  Filled 2017-01-24: qty 5

## 2017-01-24 MED ORDER — LEVALBUTEROL HCL 1.25 MG/0.5ML IN NEBU
1.2500 mg | INHALATION_SOLUTION | Freq: Three times a day (TID) | RESPIRATORY_TRACT | Status: DC
  Administered 2017-01-24: 1.25 mg via RESPIRATORY_TRACT
  Filled 2017-01-24 (×4): qty 0.5

## 2017-01-24 MED ORDER — PHENYLEPHRINE HCL 10 MG/ML IJ SOLN
INTRAMUSCULAR | Status: DC | PRN
  Administered 2017-01-24 (×2): 80 ug via INTRAVENOUS
  Administered 2017-01-24: 40 ug via INTRAVENOUS

## 2017-01-24 MED ORDER — CHLORHEXIDINE GLUCONATE CLOTH 2 % EX PADS: 6.0000 | MEDICATED_PAD | Freq: Once | CUTANEOUS | Status: DC

## 2017-01-24 MED ORDER — ROCURONIUM BROMIDE 100 MG/10ML IV SOLN
INTRAVENOUS | Status: DC | PRN
  Administered 2017-01-24: 60 mg via INTRAVENOUS
  Administered 2017-01-24: 10 mg via INTRAVENOUS

## 2017-01-24 MED ORDER — FENTANYL CITRATE (PF) 100 MCG/2ML IJ SOLN: 25.0000 ug | INTRAMUSCULAR | Status: DC | PRN

## 2017-01-24 MED ORDER — ONDANSETRON HCL 4 MG/2ML IJ SOLN
INTRAMUSCULAR | Status: AC
  Filled 2017-01-24: qty 2

## 2017-01-24 MED ORDER — FENTANYL CITRATE (PF) 250 MCG/5ML IJ SOLN
INTRAMUSCULAR | Status: DC | PRN
  Administered 2017-01-24: 100 ug via INTRAVENOUS

## 2017-01-24 MED ORDER — DEXAMETHASONE SODIUM PHOSPHATE 4 MG/ML IJ SOLN
INTRAMUSCULAR | Status: DC | PRN
  Administered 2017-01-24: 10 mg via INTRAVENOUS

## 2017-01-24 MED ORDER — CEFUROXIME SODIUM 1.5 G IV SOLR
1.5000 g | INTRAVENOUS | Status: AC
  Administered 2017-01-24: 1.5 g via INTRAVENOUS

## 2017-01-24 MED ORDER — ROCURONIUM BROMIDE 10 MG/ML (PF) SYRINGE
PREFILLED_SYRINGE | INTRAVENOUS | Status: AC
  Filled 2017-01-24: qty 5

## 2017-01-24 MED ORDER — ROCURONIUM BROMIDE 10 MG/ML (PF) SYRINGE
PREFILLED_SYRINGE | INTRAVENOUS | Status: AC
  Filled 2017-01-24: qty 10

## 2017-01-24 MED ORDER — SUCCINYLCHOLINE CHLORIDE 20 MG/ML IJ SOLN
INTRAMUSCULAR | Status: DC | PRN
  Administered 2017-01-24: 140 mg via INTRAVENOUS

## 2017-01-24 SURGICAL SUPPLY — 51 items
ADH SKN CLS APL DERMABOND .7 (GAUZE/BANDAGES/DRESSINGS)
BLADE SURG 10 STRL SS (BLADE) IMPLANT
BRUSH CYTOL CELLEBRITY 1.5X140 (MISCELLANEOUS) ×1 IMPLANT
CANISTER SUCT 3000ML PPV (MISCELLANEOUS) ×3 IMPLANT
CLIP VESOCCLUDE MED 6/CT (CLIP) IMPLANT
CONT SPEC 4OZ CLIKSEAL STRL BL (MISCELLANEOUS) ×2 IMPLANT
COVER BACK TABLE 60X90IN (DRAPES) ×2 IMPLANT
COVER DOME SNAP 22 D (MISCELLANEOUS) ×2 IMPLANT
COVER SURGICAL LIGHT HANDLE (MISCELLANEOUS) ×2 IMPLANT
DERMABOND ADVANCED (GAUZE/BANDAGES/DRESSINGS)
DERMABOND ADVANCED .7 DNX12 (GAUZE/BANDAGES/DRESSINGS) ×1 IMPLANT
DRAPE LAPAROTOMY T 102X78X121 (DRAPES) IMPLANT
DRSG AQUACEL AG ADV 3.5X14 (GAUZE/BANDAGES/DRESSINGS) IMPLANT
ELECT CAUTERY BLADE 6.4 (BLADE) IMPLANT
ELECT REM PT RETURN 9FT ADLT (ELECTROSURGICAL)
ELECTRODE REM PT RTRN 9FT ADLT (ELECTROSURGICAL) ×1 IMPLANT
FORCEPS BIOP RJ4 1.8 (CUTTING FORCEPS) ×1 IMPLANT
GAUZE SPONGE 4X4 12PLY STRL (GAUZE/BANDAGES/DRESSINGS) ×2 IMPLANT
GAUZE SPONGE 4X4 16PLY XRAY LF (GAUZE/BANDAGES/DRESSINGS) ×2 IMPLANT
GLOVE BIO SURGEON STRL SZ 6.5 (GLOVE) ×3 IMPLANT
GOWN STRL REUS W/ TWL LRG LVL3 (GOWN DISPOSABLE) ×3 IMPLANT
GOWN STRL REUS W/TWL LRG LVL3 (GOWN DISPOSABLE) ×6
HEMOSTAT SURGICEL 2X14 (HEMOSTASIS) IMPLANT
KIT BASIN OR (CUSTOM PROCEDURE TRAY) ×2 IMPLANT
KIT CLEAN ENDO COMPLIANCE (KITS) ×6 IMPLANT
KIT ROOM TURNOVER OR (KITS) ×2 IMPLANT
MARKER SKIN DUAL TIP RULER LAB (MISCELLANEOUS) ×2 IMPLANT
NEEDLE EBUS SONO TIP PENTAX (NEEDLE) ×2 IMPLANT
NEEDLE ECHOTIP HI DEF 22GA (NEEDLE) ×2 IMPLANT
NEEDLE FILTER BLUNT 18X 1/2SAF (NEEDLE)
NEEDLE FILTER BLUNT 18X1 1/2 (NEEDLE) IMPLANT
NS IRRIG 1000ML POUR BTL (IV SOLUTION) ×2 IMPLANT
OIL SILICONE PENTAX (PARTS (SERVICE/REPAIRS)) ×2 IMPLANT
PACK SURGICAL SETUP 50X90 (CUSTOM PROCEDURE TRAY) ×2 IMPLANT
PAD ARMBOARD 7.5X6 YLW CONV (MISCELLANEOUS) ×4 IMPLANT
PENCIL BUTTON HOLSTER BLD 10FT (ELECTRODE) IMPLANT
SPONGE INTESTINAL PEANUT (DISPOSABLE) IMPLANT
STAPLER VISISTAT 35W (STAPLE) IMPLANT
SUT VIC AB 3-0 SH 18 (SUTURE) IMPLANT
SUT VICRYL 4-0 PS2 18IN ABS (SUTURE) ×1 IMPLANT
SWAB COLLECTION DEVICE MRSA (MISCELLANEOUS) IMPLANT
SWAB CULTURE ESWAB REG 1ML (MISCELLANEOUS) IMPLANT
SYR 10ML LL (SYRINGE) ×3 IMPLANT
SYR 20CC LL (SYRINGE) ×2 IMPLANT
SYR 20ML ECCENTRIC (SYRINGE) ×4 IMPLANT
TOWEL GREEN STERILE (TOWEL DISPOSABLE) ×2 IMPLANT
TOWEL GREEN STERILE FF (TOWEL DISPOSABLE) ×3 IMPLANT
TRAP SPECIMEN MUCOUS 40CC (MISCELLANEOUS) ×2 IMPLANT
TUBE CONNECTING 12X1/4 (SUCTIONS) ×2 IMPLANT
TUBE CONNECTING 20X1/4 (TUBING) ×4 IMPLANT
WATER STERILE IRR 1000ML POUR (IV SOLUTION) ×2 IMPLANT

## 2017-01-24 NOTE — Progress Notes (Signed)
XOpenex neb administered

## 2017-01-24 NOTE — Anesthesia Procedure Notes (Signed)
Procedure Name: Intubation Date/Time: 01/24/2017 7:42 AM Performed by: Glynda Jaeger, CRNA Pre-anesthesia Checklist: Patient identified, Patient being monitored, Timeout performed, Emergency Drugs available and Suction available Patient Re-evaluated:Patient Re-evaluated prior to induction Oxygen Delivery Method: Circle System Utilized Preoxygenation: Pre-oxygenation with 100% oxygen Induction Type: IV induction Laryngoscope Size: Mac and 4 Grade View: Grade I Tube type: Oral Tube size: 8.5 mm Number of attempts: 1 Airway Equipment and Method: Stylet Placement Confirmation: ETT inserted through vocal cords under direct vision,  positive ETCO2 and breath sounds checked- equal and bilateral Secured at: 23 cm Tube secured with: Tape Dental Injury: Teeth and Oropharynx as per pre-operative assessment

## 2017-01-24 NOTE — Brief Op Note (Signed)
      [address]       [place] [address]             [phone]     01/24/2017  8:43 AM  PATIENT:  Jeffery Wood  61 y.o. male  PRE-OPERATIVE DIAGNOSIS:  Hilar Mass  POST-OPERATIVE DIAGNOSIS:  Hilar Mass, quick stain, sheets of small blue cells  PROCEDURE:  Procedure(s): VIDEO BRONCHOSCOPY WITH ENDOBRONCHIAL ULTRASOUND with TRANSBRONCHIAL BIOPSY (N/A)  SURGEON:  Surgeon(s) and Role:    * Grace Isaac, MD - Primary   ANESTHESIA:   general  EBL:  10 mL   BLOOD ADMINISTERED:none  DRAINS: none   LOCAL MEDICATIONS USED:  NONE   SPECIMEN:  Source of Specimen:  #7 nodes and right upper lobe lesion   DISPOSITION OF SPECIMEN:  PATHOLOGY  COUNTS:  YES   DICTATION: .Dragon Dictation  PLAN OF CARE: Discharge to home after PACU  PATIENT DISPOSITION:  PACU - hemodynamically stable.   Delay start of Pharmacological VTE agent (>24hrs) due to surgical blood loss or risk of bleeding: yes

## 2017-01-24 NOTE — H&P (Signed)
[address]       [place] [address]             [phone]                    Jeffery Wood Jeffery Wood Medical Record #[id number] Date of Birth: 1955/12/19  Referring: No ref. provider found Primary Care: Jeffery Erp, MD  Chief Complaint:    No chief complaint on file.   History of Present Illness:    Jeffery Wood 61 y.o. male is seen in the office 1-1/2 months increasing shortness of breath and chest discomfort with deep breath. Patient is previous smoker quit in 1990 . He has had increased wheezing, night sweats . No weight loss . 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  Family History - Prothrombin  Heterozygous II 202010 Mutation Factor 2 in brother  Current Activity/ Functional Status:  Patient is independent with mobility/ambulation, transfers, ADL's, IADL's.   Zubrod Score: At the time of surgery this patient's most appropriate activity status/level should be described as: []     0    Normal activity, no symptoms [x]     1    Restricted in physical strenuous activity but ambulatory, able to do out light work []     2    Ambulatory and capable of self care, unable to do work activities, up and about               >50 % of waking hours                              []     3    Only limited self care, in bed greater than 50% of waking hours []     4    Completely disabled, no self care, confined to bed or chair []     5    Moribund   Past Medical History:  Diagnosis Date  . Arthritis   . Asthma   . Cancer ([facility])    basal cell of right temple; carcinoids  . Diabetes ([facility])   . GERD (gastroesophageal reflux disease)   . Headache   . Hypertension   . Sleep apnea     Past Surgical History:  Procedure Laterality Date  . CARPAL TUNNEL RELEASE    . KNEE ARTHROSCOPY    . LEFT HEART CATHETERIZATION WITH CORONARY ANGIOGRAM N/A 12/07/2010   Procedure: LEFT HEART CATHETERIZATION WITH CORONARY  ANGIOGRAM;  Surgeon: Leonie Man, MD;  Location: [facility] CATH LAB;  Service: Cardiovascular;  Laterality: N/A;    Family History  Problem Relation Age of Onset  . Emphysema Mother   . Asthma Mother   . Heart disease Father     Social History   Socioeconomic History  . Marital status: Married    Spouse name: Not on file  . Number of children: Not on file  . Years of education: Not on file  . Highest education level: Not on file  Social Needs  . Financial resource strain: Not on file  . Food insecurity - worry: Not on file  . Food insecurity - inability: Not on file  . Transportation needs - medical: Not on file  . Transportation needs - non-medical: Not on file  Occupational History  . 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 Smoking status: Former Smoker    Packs/day: 3.00    Years: 20.00    Pack years: 60.00    Types: Cigarettes    Last attempt to quit: 01/31/1988    Years since quitting: 28.9  . Smokeless tobacco: Never Used  Substance and Sexual Activity  . Alcohol use: No  . Drug use: No  . Sexual activity: Not on file  Other Topics Concern  . Not on file  Social History Narrative  . Not on file    Social History   Tobacco Use  Smoking Status Former Smoker  . Packs/day: 3.00  . Years: 20.00  . Pack years: 60.00  . Types: Cigarettes  . Last attempt to quit: 01/31/1988  . Years since quitting: 29.0  Smokeless Tobacco Never Used    Social History   Substance and Sexual Activity  Alcohol Use No     No Known Allergies  Current Facility-Administered Medications  Medication Dose Route Frequency Provider Last Rate Last Dose  . cefUROXime (ZINACEF) 1.5 g in dextrose 5 % 50 mL IVPB  1.5 g Intravenous 60 min Pre-Op Grace Isaac, MD      . Chlorhexidine Gluconate Cloth 2 % PADS 6 each  6 each Topical Once Grace Isaac, MD      . dextrose 5 % with cefUROXime (ZINACEF) ADS Med             Pertinent items  are noted in HPI.   Review of Systems:     Cardiac Review of Systems: [Y] = yes  or   [  ] = no   Chest Pain [  y  ]  Resting SOB [ y] Exertional SOB  [ y ]  Vertell Limber Florencio.Farrier ]   Pedal Edema [n  ]    Palpitations Florencio.Farrier  ] Syncope  Florencio.Farrier ]   Presyncope [ n  ]  General Review of Systems: [Y] = yes [  ]=no Constitional: recent weight change [ y ];  Wt loss over the last 3 months [ 5  ] anorexia [  ]; fatigue [ y ]; nausea [  ]; night sweats Blue.Reese  ]; fever [n ]; or chills [  ];          Dental: poor dentition[  ]; Last Dentist visit:   Eye : blurred vision [  ]; diplopia [   ]; vision changes [  ];  Amaurosis fugax[  ]; Resp: cough [ y ];  wheezing[y  ];  hemoptysis[n ]; shortness of breath[ y ]; paroxysmal nocturnal dyspnea[ y ]; dyspnea on exertion[  ]; or orthopnea[  ];  GI:  gallstones[  ], vomiting[n  ];  dysphagia[  ]; melena[  ];  hematochezia [  ]; heartburn[  ];   Hx of  Colonoscopy[y  ]; GU: kidney stones [  ]; hematuria[  ];   dysuria [  ];  nocturia[  ];  history of     obstruction [  ]; urinary frequency [  ]             Skin: rash, swelling[  ];, hair loss[  ];  peripheral edema[  ];  or itching[  ]; Musculosketetal: myalgias[  ];  joint swelling[  ];  joint erythema[  ];  joint pain[  ];  back pain[  ];  Heme/Lymph: bruising[  ];  bleeding[  ];  anemia[  ];  Neuro: TIA[n ];  headaches[  ];  stroke[  ];  vertigo[  ];  seizures[  ];   paresthesias[  ];  difficulty walking[  ];  Psych:depression[ n ]; anxiety[  ];  Endocrine: diabetes[y   PHYSICAL EXAMINATION: General appearance: alert, cooperative, appears stated age and no distress Head: Normocephalic, without obvious abnormality, atraumatic Neck: no adenopathy, no carotid bruit, no JVD, supple, symmetrical, trachea midline and thyroid not enlarged, symmetric, no tenderness/mass/nodules Lymph nodes: Cervical, supraclavicular, and axillary nodes normal. Resp: diminished breath sounds RUL Back: negative, symmetric, no curvature. ROM normal.  No CVA tenderness. Cardio: regular rate and rhythm, S1, S2 normal, no murmur, click, rub or gallop GI: soft, non-tender; bowel sounds normal; no masses,  no organomegaly Extremities: extremities normal, atraumatic, no cyanosis or edema and Homans sign is negative, no sign of DVT Neurologic: Grossly normal  Diagnostic Studies & Laboratory data:     Recent Radiology Findings:   Dg Chest 2 View  Result Date: 01/15/2017 CLINICAL DATA:  Chest pain x1 month, shortness of breath with exertion, abnormal EKG EXAM: CHEST  2 VIEW COMPARISON:  12/01/2010 FINDINGS: Medial right upper lobe/ perihilar mass. Associated mild right upper lobe opacity. Bilateral nipple shadows. No pleural effusion or pneumothorax. The heart is normal in size. Degenerative changes of the visualized thoracolumbar spine. IMPRESSION: Medial right upper lobe/perihilar mass. Associated mild right upper lobe opacity. CT chest with contrast is suggested for further evaluation. Electronically Signed   By: Julian Hy M.D.   On: 01/15/2017 11:51   Ct Chest W Contrast  Result Date: 01/19/2017 CLINICAL DATA:  Abnormal chest radiograph. History of carcinoid tumor. Chest pain scapular pain. EXAM: CT CHEST WITH CONTRAST TECHNIQUE: Multidetector CT imaging of the chest was performed during intravenous contrast administration. CONTRAST:  39mL ISOVUE-300 IOPAMIDOL (ISOVUE-300) INJECTION 61% COMPARISON:  Chest radiograph 01/15/2017, CT abdomen 08/31/2005 FINDINGS: Cardiovascular: Mediastinal mass compresses the superior vena cava from the level the brachycephalic vein to the RIGHT atrium. The aorta and its branches are normal. Trace pericardial thickening. Mediastinum/Nodes: Bulky mediastinal mass centered along the superior vena cava measures 10 x 8.0 cm (image 58, series 2). Subcarinal lymph node measures 1.9 cm short axis. RIGHT hilar adenopathy is contiguous with the mediastinal mass. Lungs/Pleura: RIGHT suprahilar mass obstructs the RIGHT  upper lobe bronchus with obstructive collapse. Mass is contiguous with the mediastinal mass described above. Triangular nodule in the lateral RIGHT upper lobe may represent post obstructive collapse or tumor spread Upper Abdomen: Limited view of the liver, kidneys, pancreas are unremarkable. Normal adrenal glands. Musculoskeletal: No aggressive osseous lesion. IMPRESSION: 1. RIGHT suprahilar mass which obstructs the RIGHT upper lobe bronchus and extends into the right mediastinum consistent with bronchogenic carcinoma. Consider small cell carcinoma. 2. Mass surrounds and constricts the superior vena cava from the level of the brachycephalic vein to the RIGHT atrium. 3. Subcarinal adenopathy. 4. No evidence of distant metastatic disease on CT thorax. Consider FDG PET scan for further staging. Electronically Signed   By: Suzy Bouchard M.D.   On: 01/19/2017 16:02   I have independently reviewed the above radiology studies  and reviewed the findings with the patient.   Recent Lab Findings: Lab Results  Component Value Date   WBC 11.0 (H) 01/24/2017   HGB 13.4 01/24/2017   HCT 40.5 01/24/2017   PLT 365 01/24/2017   GLUCOSE 92 01/15/2017   NA 140 01/15/2017   K 5.3 (H) 01/15/2017   CL 103 01/15/2017   CREATININE 0.96 01/15/2017   BUN 18 01/15/2017   CO2 28 01/15/2017   HGBA1C 5.7 (  H) 12/07/2010      Assessment / Plan:   Extensive mediastinal involvement of mediastinal mass suggestive of small cell lung cancer, with svc involvement  And obstruction of right upper lobe bronchus   Diabetes Gout Asthma  I ave reviewed with patient the xray findings and recommended proceed quickly with bronchoscopy, ebus poss mediastinoscopy to obtain tissue dx. He will also need PET and MRI of the brain for further staging.   The goals risks and alternatives of the planned surgical procedure Procedure(s): VIDEO BRONCHOSCOPY,EBUS,POSSIBLE MEDIASTINOSCOPY (N/A) VIDEO BRONCHOSCOPY WITH ENDOBRONCHIAL  ULTRASOUND (N/A) MEDIASTINOSCOPY (N/A)  have been discussed with the patient in detail. The risks of the procedure including death, infection, stroke, myocardial infarction, bleeding, blood transfusion have all been discussed specifically.  I have quoted Jeffery Wood a 1 % of perioperative mortality and a complication rate as high as10 %. The patient's questions have been answered.Jeffery Wood is willing  to proceed with the planned procedure.   Grace Isaac MD      [address] [place] [address] Office [phone]   Beeper [phone]  01/24/2017 7:13 AM

## 2017-01-24 NOTE — Anesthesia Preprocedure Evaluation (Addendum)
Anesthesia Evaluation  Patient identified by MRN, date of birth, ID band Patient awake    Reviewed: Allergy & Precautions, NPO status , Patient's Chart, lab work & pertinent test results  Airway Mallampati: II  TM Distance: >3 FB Neck ROM: Full    Dental no notable dental hx.    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 The aorta and its branches are normal. Trace pericardial thickening.  Mediastinum/Nodes: Bulky mediastinal mass centered along the superior vena cava measures 10 x 8.0 cm (image 58, series 2). Subcarinal lymph node measures 1.9 cm short axis. RIGHT hilar adenopathy is contiguous with the mediastinal mass.  Lungs/Pleura: RIGHT suprahilar mass obstructs the RIGHT upper lobe bronchus with obstructive collapse. Mass is contiguous with the mediastinal mass described above.  Triangular nodule in the lateral RIGHT upper lobe may represent post obstructive collapse or tumor spread  Decreased Breath sounds upper right Pulmonary exam normal  + decreased breath sounds      Cardiovascular hypertension, negative cardio ROS Normal cardiovascular exam Rhythm:Regular Rate:Normal     Neuro/Psych negative neurological ROS  negative psych ROS   GI/Hepatic Neg liver ROS, GERD  Medicated,  Endo/Other  diabetes  Renal/GU negative Renal ROS  negative genitourinary   Musculoskeletal negative musculoskeletal ROS (+)   Abdominal   Peds negative pediatric ROS (+)  Hematology negative hematology ROS (+)   Anesthesia Other Findings   Reproductive/Obstetrics negative OB ROS                            Anesthesia Physical Anesthesia Plan  ASA: III  Anesthesia Plan: General   Post-op Pain Management:    Induction: Intravenous  PONV Risk Score and Plan: 2 and  Ondansetron, Dexamethasone and Treatment may vary due to age or medical condition  Airway Management Planned: Oral ETT  Additional Equipment:   Intra-op Plan:   Post-operative Plan: Extubation in OR  Informed Consent: I have reviewed the patients History and Physical, chart, labs and discussed the procedure including the risks, benefits and alternatives for the proposed anesthesia with the patient or authorized representative who has indicated his/her understanding and acceptance.   Dental advisory given  Plan Discussed with: CRNA and Surgeon  Anesthesia Plan Comments:         Anesthesia Quick Evaluation

## 2017-01-24 NOTE — Progress Notes (Signed)
Dr Kalman Shan  Aware pt feeling better and being discharged

## 2017-01-24 NOTE — Progress Notes (Signed)
After going over d/c instructions pt became more short of breath, No pain, Dr Kalman Shan made aware and coming to bedside, VSS

## 2017-01-24 NOTE — Transfer of Care (Signed)
Immediate Anesthesia Transfer of Care Note  Patient: Jeffery Wood  Procedure(s) Performed: VIDEO BRONCHOSCOPY WITH ENDOBRONCHIAL ULTRASOUND with TRANSBRONCHIAL BIOPSY (N/A )  Patient Location: PACU  Anesthesia Type:General  Level of Consciousness: awake, alert , patient cooperative and responds to stimulation  Airway & Oxygen Therapy: Patient Spontanous Breathing and Patient connected to face mask oxygen  Post-op Assessment: Report given to RN, Post -op Vital signs reviewed and stable and Patient moving all extremities X 4  Post vital signs: Reviewed and stable  Last Vitals:  Vitals:   01/24/17 0622 01/24/17 0851  BP: 108/65   Pulse: 96   Resp: 20   Temp: 37 C (P) 36.9 C  SpO2: 98%     Last Pain:  Vitals:   01/24/17 0709  PainSc: 4          Complications: No apparent anesthesia complications

## 2017-01-24 NOTE — Discharge Instructions (Signed)
Flexible Bronchoscopy, Care After °This sheet gives you information about how to care for yourself after your procedure. Your health care provider may also give you more specific instructions. If you have problems or questions, contact your health care provider. °What can I expect after the procedure? °After the procedure, it is common to have the following symptoms for 24-48 hours: °· A cough that is worse than it was before the procedure. °· A low-grade fever. °· A sore throat or hoarse voice. °· Small streaks of blood in the mucus from your lungs (sputum), if tissue samples were removed (biopsy). ° °Follow these instructions at home: °Eating and drinking °· Do not eat or drink anything (including water) for 2 hours after your procedure, or until your numbing medicine (local anesthetic) has worn off. Having a numb throat increases your risk of burning yourself or choking. °· After your numbness is gone and your cough and gag reflexes have returned, you may start eating only soft foods and slowly drinking liquids. °· The day after the procedure, return to your normal diet. °Driving °· Do not drive for 24 hours if you were given a medicine to help you relax (sedative). °· Do not drive or use heavy machinery while taking prescription pain medicine. °General instructions °· Take over-the-counter and prescription medicines only as told by your health care provider. °· Return to your normal activities as told by your health care provider. Ask your health care provider what activities are safe for you. °· Do not use any products that contain nicotine or tobacco, such as cigarettes and e-cigarettes. If you need help quitting, ask your health care provider. °· Keep all follow-up visits as told by your health care provider. This is important, especially if you had a biopsy taken. °Get help right away if: °· You have shortness of breath that gets worse. °· You become light-headed or feel like you might faint. °· You have  chest pain. °· You cough up more than a small amount of blood. °· The amount of blood you cough up increases. °Summary °· Common symptoms in the 24-48 hours following a flexible bronchoscopy include cough, low-grade fever, sore throat or hoarse voice, and blood-streaked mucus from the lungs (if you had a biopsy). °· Do not eat or drink anything (including water) for 2 hours after your procedure, or until your local anesthetic has worn off. You can return to your normal diet the day after the procedure. °· Get help right away if you develop worsening shortness of breath, have chest pain, become light-headed, or cough up more than a small amount of blood. °This information is not intended to replace advice given to you by your health care provider. Make sure you discuss any questions you have with your health care provider. °Document Released: 08/05/2004 Document Revised: 02/04/2016 Document Reviewed: 02/04/2016 °Elsevier Interactive Patient Education © 2017 [employer] ° °

## 2017-01-24 NOTE — Anesthesia Postprocedure Evaluation (Signed)
Anesthesia Post Note  Patient: Jeffery Wood  Procedure(s) Performed: VIDEO BRONCHOSCOPY WITH ENDOBRONCHIAL ULTRASOUND with TRANSBRONCHIAL BIOPSY (N/A )     Patient location during evaluation: PACU Anesthesia Type: General Level of consciousness: awake and alert Pain management: pain level controlled Vital Signs Assessment: post-procedure vital signs reviewed and stable Respiratory status: spontaneous breathing, nonlabored ventilation, respiratory function stable and patient connected to nasal cannula oxygen Cardiovascular status: blood pressure returned to baseline and stable Postop Assessment: no apparent nausea or vomiting Anesthetic complications: no    Last Vitals:  Vitals:   01/24/17 0908 01/24/17 0913  BP: 102/61 106/73  Pulse: (!) 103 (!) 102  Resp: 15 11  Temp:  (!) 36.1 C  SpO2: 95% 95%    Last Pain:  Vitals:   01/24/17 0709  PainSc: 4                  Eldean Klatt S

## 2017-01-25 ENCOUNTER — Other Ambulatory Visit: Payer: Self-pay | Admitting: Cardiothoracic Surgery

## 2017-01-25 ENCOUNTER — Encounter (HOSPITAL_COMMUNITY): Payer: Self-pay | Admitting: Cardiothoracic Surgery

## 2017-01-25 ENCOUNTER — Ambulatory Visit (HOSPITAL_COMMUNITY): Payer: BC Managed Care – PPO

## 2017-01-25 DIAGNOSIS — G44209 Tension-type headache, unspecified, not intractable: Secondary | ICD-10-CM

## 2017-01-25 DIAGNOSIS — R918 Other nonspecific abnormal finding of lung field: Secondary | ICD-10-CM

## 2017-01-25 NOTE — Op Note (Signed)
NAME:  Jeffery Wood, Timohty                     ACCOUNT NO.:  MEDICAL RECORD NO.:  [id number]  LOCATION:                                 FACILITY:  PHYSICIAN:  Lanelle Bal, MD         DATE OF BIRTH:  DATE OF PROCEDURE:  01/24/2017 DATE OF DISCHARGE:                              OPERATIVE REPORT   PREOPERATIVE DIAGNOSES:  Right upper lobe lung mass and mediastinal mass.  POSTOPERATIVE DIAGNOSES:  Right upper lobe lung mass and mediastinal mass.  Quick smear reveals sheets of small "blue cells."  PROCEDURES PERFORMED:  Bronchoscopy with endobronchial ultrasound, transbronchial biopsy of #7 node, and biopsy of right upper lobe lung mass.  SURGEON:  Lanelle Bal, MD.  BRIEF HISTORY:  The patient is a 61 year old male who presents with a 6- to- 8-week history of increasing shortness of breath and chest discomfort.  Ultimately, he came to the emergency room.  A chest x-ray and subsequent CT scan confirmed a significant right hilar, right mediastinal mass with obstruction involving the right upper lobe.  The patient also has compromise of the superior vena cava and innominate vein, but without symptoms of facial swelling.  The patient was referred by Dr. Benay Spice and to obtain a rapid tissue diagnosis, we recommended proceeding with bronchoscopy with EBUS and possible mediastinoscopy. The patient agreed and signed informed consent.  DESCRIPTION OF PROCEDURE:  The patient underwent general endotracheal anesthesia without incident.  Appropriate time-out was performed and we proceeded with bronchoscopy through the endotracheal tube with a 2.8-mm video bronchoscope.  The left tracheobronchial tree was free of any obstruction.  On examination of the right mainstem bronchus, there appeared to be some extrinsic compression, but no obstruction.  As we moved into the right upper lobe, there were mass, endobronchial lesions obstructing both the apical and anterior segments.  We then removed  the visual scope and placed the EBUS scope and easily identified large mediastinal nodes in the #7 region.  With the EBUS scope parked in the left mainstem bronchus, multiple passes of a #7 node were performed and tissue submitted for quick stain sent for cytology.  While waiting for results of this, we then went back to the visual scope and did brushings of the right upper lobe lung mass and then used biopsy forceps to take multiple pinch biopsies of the right upper lobe mass.  There was very minor bleeding.  Dilute topical epinephrine was applied.  With the operative field hemostatic, the scopes were removed.  Final interpretation of pathology was that we had sufficient material, but a definitive diagnosis could not be made, but it did appear to be lymphoid tissue and clinically consistent with small cell carcinoma.  We will await the final pathology from the #7 nodes and right upper lobe.  With sufficient material of pathology to work with, we then awakened and extubated the patient in the operating room.  He was transferred to the recovery room for further postoperative care.     Lanelle Bal, MD     EG/MEDQ  D:  01/24/2017  T:  01/25/2017  Job:  [id number]  cc:   Izola Price  Benay Spice, M.D.

## 2017-01-31 ENCOUNTER — Ambulatory Visit: Payer: BC Managed Care – PPO | Admitting: Oncology

## 2017-01-31 ENCOUNTER — Telehealth: Payer: Self-pay | Admitting: Oncology

## 2017-01-31 ENCOUNTER — Other Ambulatory Visit: Payer: Self-pay

## 2017-01-31 ENCOUNTER — Encounter: Payer: Self-pay | Admitting: Oncology

## 2017-01-31 VITALS — BP 123/65 | HR 106 | Temp 97.6°F | Resp 18 | Ht 75.0 in | Wt 254.1 lb

## 2017-01-31 DIAGNOSIS — R918 Other nonspecific abnormal finding of lung field: Secondary | ICD-10-CM

## 2017-01-31 NOTE — Progress Notes (Signed)
[facility] OFFICE PROGRESS NOTE   Diagnosis: Lung mass, chest lymphadenopathy  INTERVAL HISTORY:   [name] underwent a bronchoscopy with transbronchial biopsy of a level 7 node and biopsy of a right upper lobe mass 01/24/2017.  There was compression of the right mainstem bronchus, a mass was noted in the right upper lobe.  Mediastinal lymph nodes were seen on EBUS.  Multiple biopsies of a level 7 node were obtained.  Brushings and a biopsy of the right upper lobe mass were obtained.  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  He reports increased chest pain for several days following the procedure.  This has resolved.  He now has bilateral shoulder pain.  He has exertional dyspnea.  No dyspnea at rest.  He continues to have a poor appetite.    Objective:  Vital signs in last 24 hours:  Blood pressure 123/65, pulse (!) 106, temperature 97.6 F (36.4 C), temperature source Oral, resp. rate 18, height 6\' 3"  (1.905 m), weight 254 lb 1.6 oz (115.3 kg), SpO2 97 %.    HEENT: Neck without mass Lymphatics: No cervical or supraclavicular nodes Resp: Lungs clear bilaterally, decreased breath sounds at the right upper anterior chest, no respiratory distress Cardio: Regular rate and rhythm GI: No hepatomegaly, nontender Vascular: No leg edema   Lab Results:  Lab Results  Component Value Date   WBC 11.0 (H) 01/24/2017   HGB 13.4 01/24/2017   HCT 40.5 01/24/2017   MCV 90.4 01/24/2017   PLT 365 01/24/2017    CMP     Component Value Date/Time   NA 136 01/24/2017 0643   K 4.3 01/24/2017 0643   CL 101 01/24/2017 0643   CO2 26 01/24/2017 0643   GLUCOSE 95 01/24/2017 0643   BUN 13 01/24/2017 0643   CREATININE 0.73 01/24/2017 0643   CALCIUM 9.2 01/24/2017 0643   PROT 6.3 (L) 01/24/2017 0643   ALBUMIN 3.1 (L) 01/24/2017 0643   AST 16 01/24/2017 0643   ALT 13 (L) 01/24/2017 0643   ALKPHOS 52 01/24/2017 0643   BILITOT 0.6 01/24/2017 0643   GFRNONAA >60 01/24/2017 0643   GFRAA >60 01/24/2017 0643    No results found for: CEA1  Lab Results  Component Value Date   INR 1.09 01/24/2017    Imaging:  No results found.  Medications: I have reviewed the patient's current medications.   Assessment/Plan: 1. Right suprahilar mass with obstruction of the right upper lobe bronchus and extension to the right mediastinum ? CT chest 01/19/2017-mass constricting the superior vena cava, subcarinal adenopathy, right suprahilar/mediastinal mass 2. Chest/upper back pain-likely secondary to #1 3. Exertional dyspnea secondary to #1 and COPD 4. Asthma 5. Diabetes 6. Gout 7. Remote history of "carcinoid syndrome " 8. Remote history of tobacco use 9. Basal cell carcinoma removed from the right face 10. Chronic low back pain, status post epidural steroid injections-followed at a pain management clinic  Disposition: Mr. Brahmbhatt appears stable.  He underwent a bronchoscopy last week.  An endobronchial mass and mediastinal lymph nodes were noted.  I discussed the pathology with Dr. Tresa Moore.  She reports the histology review is nondiagnostic to date.  There is no clear evidence of a carcinoma or lymphoma.  She is performing additional evaluation and will have a final report within the next 1-2 days.  I will asked Dr. Servando Snare to consider a mediastinoscopy if the bronchoscopy material is nondiagnostic.  Mr. Hallas is scheduled for a staging PET scan  and brain MRI at the end of this week.  He will return for an office visit here on 02/05/2017.  We discussed the differential diagnosis including lung cancer, lymphoma, and other malignancies.  I think it is unlikely the current presentation is related to the "carcinoid syndrome "he was diagnosed with many years ago.  He understands we cannot establish a treatment plan until we have a definitive diagnosis.  Betsy Coder, MD  01/31/2017  2:17 PM

## 2017-01-31 NOTE — Telephone Encounter (Signed)
Scheduled appt per 1/2 los - Patient Is aware of appt date and time - did not want AVS or calender printed.

## 2017-01-31 NOTE — Progress Notes (Signed)
Patient here for follow up. His scan was canceled due to insurance has been re-scheduled for the end of this week.

## 2017-02-01 ENCOUNTER — Other Ambulatory Visit: Payer: Self-pay | Admitting: Oncology

## 2017-02-01 ENCOUNTER — Telehealth: Payer: Self-pay | Admitting: *Deleted

## 2017-02-01 ENCOUNTER — Ambulatory Visit: Payer: BC Managed Care – PPO

## 2017-02-01 DIAGNOSIS — R0789 Other chest pain: Secondary | ICD-10-CM

## 2017-02-01 DIAGNOSIS — R918 Other nonspecific abnormal finding of lung field: Secondary | ICD-10-CM

## 2017-02-01 NOTE — Telephone Encounter (Signed)
Dr.Sherrill discussed case with Dr. Servando Snare. Bronchoscopy was non-diagnostic. Pt will need CT biopsy. Per Tiffany, IR scheduler, they are unable to work in for biopsy 1/4. Will make MD aware.

## 2017-02-01 NOTE — Telephone Encounter (Signed)
Informed pt that radiology will contact him with biopsy appt since the bronchoscopy did not yield a diagnostic specimen. Pt voiced understanding.

## 2017-02-01 NOTE — Telephone Encounter (Signed)
Biopsy ordered, please call IR 1/4 to see when it can be done Patient needs treatment soon, would like to schedule 1/7 if possible He is scheduled to see [place] 1/7, can move to 1/9 or 1/10 depending on biopsy date

## 2017-02-02 ENCOUNTER — Encounter
Admission: RE | Admit: 2017-02-02 | Discharge: 2017-02-02 | Disposition: A | Discharge status: Home / Self Care | Payer: BC Managed Care – PPO | Source: Ambulatory Visit | Attending: Cardiothoracic Surgery | Admitting: Cardiothoracic Surgery

## 2017-02-02 ENCOUNTER — Other Ambulatory Visit: Payer: Self-pay | Admitting: *Deleted

## 2017-02-02 DIAGNOSIS — R918 Other nonspecific abnormal finding of lung field: Secondary | ICD-10-CM

## 2017-02-02 LAB — GLUCOSE, CAPILLARY: Glucose-Capillary: 79 mg/dL (ref 65–99)

## 2017-02-02 MED ORDER — FLUDEOXYGLUCOSE F - 18 (FDG) INJECTION
13.2200 | Freq: Once | INTRAVENOUS | Status: AC | PRN
  Administered 2017-02-02: 13.22 via INTRAVENOUS

## 2017-02-02 MED ORDER — SENNA 8.6 MG PO TABS: 1.0000 | ORAL_TABLET | Freq: Two times a day (BID) | ORAL | 0 refills | Status: DC

## 2017-02-02 MED ORDER — POLYETHYLENE GLYCOL 3350 17 G PO PACK: 17.0000 g | PACK | Freq: Every day | ORAL | 0 refills | Status: DC

## 2017-02-02 MED ORDER — OXYCODONE HCL 5 MG PO TABS: 5.0000 mg | ORAL_TABLET | ORAL | 0 refills | Status: DC | PRN

## 2017-02-02 NOTE — Telephone Encounter (Signed)
Dr. Benay Spice called pt to discuss plan for excisional biopsy of groin node. Pt will keep 1/7 appt as scheduled to discuss PET and plan for biopsy. Pt reported increased arm pain. Dr. Benay Spice ordered and instructed pt re: Oxycodone. Pt's daughter will pick up this evening. Written instructions given for bowel regimen Miralax daily and Senokot BID.

## 2017-02-03 ENCOUNTER — Ambulatory Visit
Admission: RE | Admit: 2017-02-03 | Discharge: 2017-02-03 | Disposition: A | Discharge status: Home / Self Care | Payer: BC Managed Care – PPO | Source: Ambulatory Visit | Attending: Cardiothoracic Surgery | Admitting: Cardiothoracic Surgery

## 2017-02-03 DIAGNOSIS — G44209 Tension-type headache, unspecified, not intractable: Secondary | ICD-10-CM

## 2017-02-03 MED ORDER — GADOBENATE DIMEGLUMINE 529 MG/ML IV SOLN
20.0000 mL | Freq: Once | INTRAVENOUS | Status: AC | PRN
  Administered 2017-02-03: 20 mL via INTRAVENOUS

## 2017-02-04 ENCOUNTER — Encounter (HOSPITAL_COMMUNITY): Payer: Self-pay | Admitting: Anesthesiology

## 2017-02-04 ENCOUNTER — Telehealth: Payer: Self-pay | Admitting: Cardiothoracic Surgery

## 2017-02-04 NOTE — Anesthesia Preprocedure Evaluation (Addendum)
Anesthesia Evaluation  Patient identified by MRN, date of birth, ID band Patient awake    Reviewed: Allergy & Precautions, NPO status , Patient's Chart, lab work & pertinent test results  Airway Mallampati: II  TM Distance: >3 FB Neck ROM: Full    Dental  (+) Teeth Intact, Dental Advisory Given   Pulmonary asthma , sleep apnea , former smoker,    breath sounds clear to auscultation       Cardiovascular hypertension, Pt. on medications  Rhythm:Regular Rate:Normal     Neuro/Psych  Headaches, negative psych ROS   GI/Hepatic Neg liver ROS, GERD  Medicated,  Endo/Other  diabetes, Type 2, Oral Hypoglycemic Agents  Renal/GU negative Renal ROS     Musculoskeletal  (+) Arthritis , Osteoarthritis,    Abdominal Normal abdominal exam  (+)   Peds  Hematology negative hematology ROS (+)   Anesthesia Other Findings   Reproductive/Obstetrics                            Anesthesia Physical Anesthesia Plan  ASA: II  Anesthesia Plan: General   Post-op Pain Management:    Induction: Intravenous  PONV Risk Score and Plan: 3 and Ondansetron, Dexamethasone and Midazolam  Airway Management Planned: Oral ETT  Additional Equipment: Arterial line  Intra-op Plan:   Post-operative Plan: Extubation in OR  Informed Consent: I have reviewed the patients History and Physical, chart, labs and discussed the procedure including the risks, benefits and alternatives for the proposed anesthesia with the patient or authorized representative who has indicated his/her understanding and acceptance.   Dental advisory given  Plan Discussed with: CRNA  Anesthesia Plan Comments:       Anesthesia Quick Evaluation

## 2017-02-04 NOTE — Telephone Encounter (Signed)
      [address]       [place] [address]             [phone]     Call patient this morning to report the brain MRI findings.  Since the PET scan on Friday of discussed the case with Dr. Benay Spice and Dr. Dalbert Batman.  Are declined needle biopsy of the lung mass.  We have considered inguinal node biopsy, though on CT/PET is noted a small and may be incidental finding.  I recommended to the patient that the most rapid way to obtain more tissue for examination is to proceed with mediastinoscopy.  Patient is agreeable with this and will plan to schedule for tomorrow morning. Grace Isaac MD      [address] [place], [address] Office [phone]   [place]

## 2017-02-05 ENCOUNTER — Encounter (HOSPITAL_COMMUNITY)
Admission: RE | Disposition: A | Discharge status: Home / Self Care | Payer: Self-pay | Source: Ambulatory Visit | Attending: Cardiothoracic Surgery

## 2017-02-05 ENCOUNTER — Telehealth: Payer: Self-pay

## 2017-02-05 ENCOUNTER — Encounter (HOSPITAL_COMMUNITY): Payer: Self-pay | Admitting: Certified Registered Nurse Anesthetist

## 2017-02-05 ENCOUNTER — Ambulatory Visit (HOSPITAL_COMMUNITY)
Admission: RE | Admit: 2017-02-05 | Discharge: 2017-02-05 | Disposition: A | Discharge status: Home / Self Care | Payer: BC Managed Care – PPO | Source: Ambulatory Visit | Attending: Cardiothoracic Surgery | Admitting: Cardiothoracic Surgery

## 2017-02-05 ENCOUNTER — Ambulatory Visit (HOSPITAL_COMMUNITY): Payer: BC Managed Care – PPO | Admitting: Certified Registered Nurse Anesthetist

## 2017-02-05 ENCOUNTER — Encounter: Payer: Self-pay | Admitting: Oncology

## 2017-02-05 ENCOUNTER — Telehealth: Payer: Self-pay | Admitting: Nurse Practitioner

## 2017-02-05 ENCOUNTER — Other Ambulatory Visit: Payer: Self-pay

## 2017-02-05 ENCOUNTER — Ambulatory Visit: Payer: BC Managed Care – PPO | Admitting: Nurse Practitioner

## 2017-02-05 DIAGNOSIS — J45909 Unspecified asthma, uncomplicated: Secondary | ICD-10-CM | POA: Insufficient documentation

## 2017-02-05 DIAGNOSIS — Z7984 Long term (current) use of oral hypoglycemic drugs: Secondary | ICD-10-CM | POA: Diagnosis not present

## 2017-02-05 DIAGNOSIS — M109 Gout, unspecified: Secondary | ICD-10-CM | POA: Insufficient documentation

## 2017-02-05 DIAGNOSIS — R51 Headache: Secondary | ICD-10-CM | POA: Insufficient documentation

## 2017-02-05 DIAGNOSIS — R222 Localized swelling, mass and lump, trunk: Secondary | ICD-10-CM | POA: Diagnosis present

## 2017-02-05 DIAGNOSIS — Z85828 Personal history of other malignant neoplasm of skin: Secondary | ICD-10-CM | POA: Insufficient documentation

## 2017-02-05 DIAGNOSIS — E119 Type 2 diabetes mellitus without complications: Secondary | ICD-10-CM | POA: Insufficient documentation

## 2017-02-05 DIAGNOSIS — Z87891 Personal history of nicotine dependence: Secondary | ICD-10-CM | POA: Insufficient documentation

## 2017-02-05 DIAGNOSIS — G4730 Sleep apnea, unspecified: Secondary | ICD-10-CM | POA: Insufficient documentation

## 2017-02-05 DIAGNOSIS — K219 Gastro-esophageal reflux disease without esophagitis: Secondary | ICD-10-CM | POA: Diagnosis not present

## 2017-02-05 DIAGNOSIS — C8510 Unspecified B-cell lymphoma, unspecified site: Secondary | ICD-10-CM | POA: Insufficient documentation

## 2017-02-05 DIAGNOSIS — M1990 Unspecified osteoarthritis, unspecified site: Secondary | ICD-10-CM | POA: Insufficient documentation

## 2017-02-05 DIAGNOSIS — R918 Other nonspecific abnormal finding of lung field: Secondary | ICD-10-CM | POA: Diagnosis not present

## 2017-02-05 DIAGNOSIS — I10 Essential (primary) hypertension: Secondary | ICD-10-CM | POA: Insufficient documentation

## 2017-02-05 HISTORY — DX: Other complications of anesthesia, initial encounter: T88.59XA

## 2017-02-05 HISTORY — PX: VIDEO MEDIASTINOSCOPY: SHX5071

## 2017-02-05 HISTORY — DX: Adverse effect of unspecified anesthetic, initial encounter: T41.45XA

## 2017-02-05 LAB — CBC
HCT: 39.6 % (ref 39.0–52.0)
Hemoglobin: 12.4 g/dL — ABNORMAL LOW (ref 13.0–17.0)
MCH: 28.6 pg (ref 26.0–34.0)
MCHC: 31.3 g/dL (ref 30.0–36.0)
MCV: 91.2 fL (ref 78.0–100.0)
Platelets: 328 10*3/uL (ref 150–400)
RBC: 4.34 MIL/uL (ref 4.22–5.81)
RDW: 14 % (ref 11.5–15.5)
WBC: 10.4 10*3/uL (ref 4.0–10.5)

## 2017-02-05 LAB — COMPREHENSIVE METABOLIC PANEL
ALT: 13 U/L — ABNORMAL LOW (ref 17–63)
AST: 24 U/L (ref 15–41)
Albumin: 3 g/dL — ABNORMAL LOW (ref 3.5–5.0)
Alkaline Phosphatase: 49 U/L (ref 38–126)
Anion gap: 9 (ref 5–15)
BUN: 12 mg/dL (ref 6–20)
CO2: 27 mmol/L (ref 22–32)
Calcium: 9 mg/dL (ref 8.9–10.3)
Chloride: 101 mmol/L (ref 101–111)
Creatinine, Ser: 0.6 mg/dL — ABNORMAL LOW (ref 0.61–1.24)
GFR calc Af Amer: >60 mL/min (ref >60)
GFR calc non Af Amer: >60 mL/min (ref >60)
Glucose, Bld: 75 mg/dL (ref 65–99)
Potassium: 4.1 mmol/L (ref 3.5–5.1)
Sodium: 137 mmol/L (ref 135–145)
Total Bilirubin: 0.8 mg/dL (ref 0.3–1.2)
Total Protein: 5.9 g/dL — ABNORMAL LOW (ref 6.5–8.1)

## 2017-02-05 LAB — PROTIME-INR
INR: 1.05
Prothrombin Time: 13.6 seconds (ref 11.4–15.2)

## 2017-02-05 LAB — GLUCOSE, CAPILLARY: Glucose-Capillary: 138 mg/dL — ABNORMAL HIGH (ref 65–99)

## 2017-02-05 LAB — TYPE AND SCREEN
ABO/RH(D): O POS
Antibody Screen: NEGATIVE

## 2017-02-05 LAB — APTT: aPTT: 31 seconds (ref 24–36)

## 2017-02-05 SURGERY — MEDIASTINOSCOPY, VIDEO-ASSISTED
Anesthesia: General

## 2017-02-05 MED ORDER — FENTANYL CITRATE (PF) 250 MCG/5ML IJ SOLN
INTRAMUSCULAR | Status: AC
  Filled 2017-02-05: qty 5

## 2017-02-05 MED ORDER — ACETAMINOPHEN 325 MG PO TABS
650.0000 mg | ORAL_TABLET | Freq: Once | ORAL | Status: AC
  Administered 2017-02-05: 650 mg via ORAL

## 2017-02-05 MED ORDER — ROCURONIUM BROMIDE 10 MG/ML (PF) SYRINGE
PREFILLED_SYRINGE | INTRAVENOUS | Status: AC
  Filled 2017-02-05: qty 5

## 2017-02-05 MED ORDER — ONDANSETRON HCL 4 MG/2ML IJ SOLN
INTRAMUSCULAR | Status: AC
  Filled 2017-02-05: qty 2

## 2017-02-05 MED ORDER — DEXTROSE 5 % IV SOLN
1.5000 g | INTRAVENOUS | Status: AC
  Administered 2017-02-05: 1.5 g via INTRAVENOUS
  Filled 2017-02-05: qty 1.5

## 2017-02-05 MED ORDER — HYDROMORPHONE HCL 1 MG/ML IJ SOLN
0.2500 mg | INTRAMUSCULAR | Status: DC | PRN
  Administered 2017-02-05 (×2): 0.5 mg via INTRAVENOUS

## 2017-02-05 MED ORDER — EPHEDRINE SULFATE-NACL 50-0.9 MG/10ML-% IV SOSY
PREFILLED_SYRINGE | INTRAVENOUS | Status: DC | PRN
  Administered 2017-02-05 (×2): 5 mg via INTRAVENOUS

## 2017-02-05 MED ORDER — ACETAMINOPHEN 325 MG PO TABS
ORAL_TABLET | ORAL | Status: AC
  Administered 2017-02-05: 650 mg via ORAL
  Filled 2017-02-05: qty 2

## 2017-02-05 MED ORDER — PROPOFOL 10 MG/ML IV BOLUS
INTRAVENOUS | Status: DC | PRN
  Administered 2017-02-05: 160 mg via INTRAVENOUS

## 2017-02-05 MED ORDER — 0.9 % SODIUM CHLORIDE (POUR BTL) OPTIME
TOPICAL | Status: DC | PRN
  Administered 2017-02-05: 1000 mL

## 2017-02-05 MED ORDER — MIDAZOLAM HCL 2 MG/2ML IJ SOLN
INTRAMUSCULAR | Status: AC
  Filled 2017-02-05: qty 2

## 2017-02-05 MED ORDER — FENTANYL CITRATE (PF) 100 MCG/2ML IJ SOLN
INTRAMUSCULAR | Status: DC | PRN
  Administered 2017-02-05 (×3): 50 ug via INTRAVENOUS
  Administered 2017-02-05: 100 ug via INTRAVENOUS

## 2017-02-05 MED ORDER — OXYCODONE HCL 5 MG PO TABS
ORAL_TABLET | ORAL | Status: AC
  Administered 2017-02-05: 10 mg via ORAL
  Filled 2017-02-05: qty 2

## 2017-02-05 MED ORDER — LIDOCAINE 2% (20 MG/ML) 5 ML SYRINGE
INTRAMUSCULAR | Status: DC | PRN
  Administered 2017-02-05: 60 mg via INTRAVENOUS

## 2017-02-05 MED ORDER — OXYCODONE HCL 5 MG PO TABS
5.0000 mg | ORAL_TABLET | Freq: Once | ORAL | Status: AC
  Administered 2017-02-05: 10 mg via ORAL

## 2017-02-05 MED ORDER — PROPOFOL 10 MG/ML IV BOLUS
INTRAVENOUS | Status: AC
  Filled 2017-02-05: qty 40

## 2017-02-05 MED ORDER — HEMOSTATIC AGENTS (NO CHARGE) OPTIME
TOPICAL | Status: DC | PRN
  Administered 2017-02-05: 1 via TOPICAL

## 2017-02-05 MED ORDER — HYDROMORPHONE HCL 1 MG/ML IJ SOLN
INTRAMUSCULAR | Status: AC
  Administered 2017-02-05: 0.5 mg via INTRAVENOUS
  Filled 2017-02-05: qty 1

## 2017-02-05 MED ORDER — ONDANSETRON HCL 4 MG/2ML IJ SOLN
INTRAMUSCULAR | Status: DC | PRN
  Administered 2017-02-05: 4 mg via INTRAVENOUS

## 2017-02-05 MED ORDER — LACTATED RINGERS IV SOLN
INTRAVENOUS | Status: DC | PRN
  Administered 2017-02-05 (×2): via INTRAVENOUS

## 2017-02-05 MED ORDER — PHENYLEPHRINE HCL 10 MG/ML IJ SOLN
INTRAMUSCULAR | Status: DC | PRN
  Administered 2017-02-05: 10 ug/min via INTRAVENOUS

## 2017-02-05 MED ORDER — LACTATED RINGERS IV SOLN: INTRAVENOUS | Status: DC

## 2017-02-05 MED ORDER — CHLORHEXIDINE GLUCONATE 4 % EX LIQD: 60.0000 mL | Freq: Once | CUTANEOUS | Status: DC

## 2017-02-05 MED ORDER — MEPERIDINE HCL 25 MG/ML IJ SOLN: 6.2500 mg | INTRAMUSCULAR | Status: DC | PRN

## 2017-02-05 MED ORDER — SUGAMMADEX SODIUM 500 MG/5ML IV SOLN
INTRAVENOUS | Status: AC
  Filled 2017-02-05: qty 5

## 2017-02-05 MED ORDER — PHENYLEPHRINE 40 MCG/ML (10ML) SYRINGE FOR IV PUSH (FOR BLOOD PRESSURE SUPPORT)
PREFILLED_SYRINGE | INTRAVENOUS | Status: DC | PRN
  Administered 2017-02-05: 80 ug via INTRAVENOUS
  Administered 2017-02-05: 40 ug via INTRAVENOUS
  Administered 2017-02-05: 80 ug via INTRAVENOUS

## 2017-02-05 MED ORDER — MIDAZOLAM HCL 5 MG/5ML IJ SOLN
INTRAMUSCULAR | Status: DC | PRN
  Administered 2017-02-05: 2 mg via INTRAVENOUS

## 2017-02-05 MED ORDER — SUGAMMADEX SODIUM 500 MG/5ML IV SOLN
INTRAVENOUS | Status: DC | PRN
Start: 2017-02-05 — End: 2017-02-05
  Administered 2017-02-05: 300 mg via INTRAVENOUS

## 2017-02-05 MED ORDER — PROMETHAZINE HCL 25 MG/ML IJ SOLN: 6.2500 mg | INTRAMUSCULAR | Status: DC | PRN

## 2017-02-05 MED ORDER — PHENYLEPHRINE 40 MCG/ML (10ML) SYRINGE FOR IV PUSH (FOR BLOOD PRESSURE SUPPORT)
PREFILLED_SYRINGE | INTRAVENOUS | Status: AC
  Filled 2017-02-05: qty 10

## 2017-02-05 MED ORDER — DEXAMETHASONE SODIUM PHOSPHATE 10 MG/ML IJ SOLN
INTRAMUSCULAR | Status: DC | PRN
  Administered 2017-02-05: 10 mg via INTRAVENOUS

## 2017-02-05 MED ORDER — ROCURONIUM BROMIDE 10 MG/ML (PF) SYRINGE
PREFILLED_SYRINGE | INTRAVENOUS | Status: DC | PRN
  Administered 2017-02-05: 5 mg via INTRAVENOUS
  Administered 2017-02-05: 70 mg via INTRAVENOUS
  Administered 2017-02-05: 10 mg via INTRAVENOUS

## 2017-02-05 MED ORDER — LIDOCAINE 2% (20 MG/ML) 5 ML SYRINGE
INTRAMUSCULAR | Status: AC
  Filled 2017-02-05: qty 5

## 2017-02-05 MED ORDER — OXYCODONE HCL 5 MG PO CAPS: 5.0000 mg | ORAL_CAPSULE | ORAL | 0 refills | Status: DC | PRN

## 2017-02-05 MED ORDER — DEXAMETHASONE SODIUM PHOSPHATE 10 MG/ML IJ SOLN
INTRAMUSCULAR | Status: AC
  Filled 2017-02-05: qty 1

## 2017-02-05 SURGICAL SUPPLY — 49 items
ADH SKN CLS APL DERMABOND .7 (GAUZE/BANDAGES/DRESSINGS) ×1
APPLIER CLIP LOGIC TI 5 (MISCELLANEOUS) IMPLANT
APR CLP MED LRG 33X5 (MISCELLANEOUS)
BLADE SURG 15 STRL LF DISP TIS (BLADE) ×1 IMPLANT
BLADE SURG 15 STRL SS (BLADE) ×2
CANISTER SUCT 3000ML PPV (MISCELLANEOUS) ×2 IMPLANT
CLIP VESOCCLUDE MED 6/CT (CLIP) IMPLANT
CLIP VESOCCLUDE SM WIDE 24/CT (CLIP) ×2 IMPLANT
CONT SPEC 4OZ CLIKSEAL STRL BL (MISCELLANEOUS) ×5 IMPLANT
COVER SURGICAL LIGHT HANDLE (MISCELLANEOUS) ×2 IMPLANT
DERMABOND ADVANCED (GAUZE/BANDAGES/DRESSINGS) ×1
DERMABOND ADVANCED .7 DNX12 (GAUZE/BANDAGES/DRESSINGS) ×1 IMPLANT
DRAPE CHEST BREAST 15X10 FENES (DRAPES) ×1 IMPLANT
DRAPE LAPAROTOMY T 102X78X121 (DRAPES) ×2 IMPLANT
ELECT BLADE 4.0 EZ CLEAN MEGAD (MISCELLANEOUS) ×2
ELECT CAUTERY BLADE 6.4 (BLADE) ×1 IMPLANT
ELECT REM PT RETURN 9FT ADLT (ELECTROSURGICAL) ×2
ELECTRODE BLDE 4.0 EZ CLN MEGD (MISCELLANEOUS) ×1 IMPLANT
ELECTRODE REM PT RTRN 9FT ADLT (ELECTROSURGICAL) ×1 IMPLANT
GAUZE SPONGE 4X4 12PLY STRL (GAUZE/BANDAGES/DRESSINGS) ×2 IMPLANT
GAUZE SPONGE 4X4 16PLY XRAY LF (GAUZE/BANDAGES/DRESSINGS) ×2 IMPLANT
GLOVE SURG SIGNA 7.5 PF LTX (GLOVE) ×2 IMPLANT
GOWN STRL REUS W/ TWL LRG LVL3 (GOWN DISPOSABLE) ×3 IMPLANT
GOWN STRL REUS W/ TWL XL LVL3 (GOWN DISPOSABLE) ×1 IMPLANT
GOWN STRL REUS W/TWL LRG LVL3 (GOWN DISPOSABLE) ×6
GOWN STRL REUS W/TWL XL LVL3 (GOWN DISPOSABLE)
HEMOSTAT SURGICEL 2X14 (HEMOSTASIS) ×1 IMPLANT
KIT BASIN OR (CUSTOM PROCEDURE TRAY) ×2 IMPLANT
KIT ROOM TURNOVER OR (KITS) ×2 IMPLANT
NS IRRIG 1000ML POUR BTL (IV SOLUTION) ×2 IMPLANT
PACK SURGICAL SETUP 50X90 (CUSTOM PROCEDURE TRAY) ×2 IMPLANT
PAD ARMBOARD 7.5X6 YLW CONV (MISCELLANEOUS) ×4 IMPLANT
PENCIL BUTTON HOLSTER BLD 10FT (ELECTRODE) ×2 IMPLANT
SPONGE INTESTINAL PEANUT (DISPOSABLE) IMPLANT
SUT SILK 2 0 (SUTURE)
SUT SILK 2-0 18XBRD TIE 12 (SUTURE) IMPLANT
SUT VIC AB 2-0 CT1 27 (SUTURE)
SUT VIC AB 2-0 CT1 TAPERPNT 27 (SUTURE) IMPLANT
SUT VIC AB 3-0 SH 18 (SUTURE) ×2 IMPLANT
SUT VIC AB 3-0 X1 27 (SUTURE) ×1 IMPLANT
SUT VICRYL 4-0 PS2 18IN ABS (SUTURE) ×2 IMPLANT
SWAB COLLECTION DEVICE MRSA (MISCELLANEOUS) IMPLANT
SWAB CULTURE ESWAB REG 1ML (MISCELLANEOUS) IMPLANT
SYR 10ML LL (SYRINGE) ×2 IMPLANT
SYR BULB IRRIGATION 50ML (SYRINGE) ×2 IMPLANT
TOWEL GREEN STERILE (TOWEL DISPOSABLE) IMPLANT
TOWEL GREEN STERILE FF (TOWEL DISPOSABLE) ×2 IMPLANT
TUBE CONNECTING 12X1/4 (SUCTIONS) ×2 IMPLANT
WATER STERILE IRR 1000ML POUR (IV SOLUTION) ×2 IMPLANT

## 2017-02-05 NOTE — Anesthesia Procedure Notes (Signed)
Arterial Line Insertion Start/End1/07/2017 7:05 AM, 02/05/2017 7:10 AM Performed by: Colin Benton, CRNA, CRNA  Patient location: Pre-op. Preanesthetic checklist: patient identified, IV checked, site marked, risks and benefits discussed, surgical consent, monitors and equipment checked, pre-op evaluation, timeout performed and anesthesia consent Lidocaine 1% used for infiltration Left, radial was placed Catheter size: 20 G Hand hygiene performed , maximum sterile barriers used  and Seldinger technique used Allen's test indicative of satisfactory collateral circulation Attempts: 1 Procedure performed without using ultrasound guided technique. Following insertion, dressing applied and Biopatch. Post procedure assessment: normal and unchanged  Patient tolerated the procedure well with no immediate complications.

## 2017-02-05 NOTE — Telephone Encounter (Signed)
Called to inform patient of new appointment time. Patient voiced understanding and agreement.

## 2017-02-05 NOTE — Telephone Encounter (Signed)
Scheduled appt per 1/7 sch msg - left voicemail for patient regarding appts.

## 2017-02-05 NOTE — Anesthesia Procedure Notes (Addendum)
Procedure Name: Intubation Date/Time: 02/05/2017 7:39 AM Performed by: Colin Benton, CRNA Pre-anesthesia Checklist: Patient identified, Emergency Drugs available, Suction available and Patient being monitored Patient Re-evaluated:Patient Re-evaluated prior to induction Oxygen Delivery Method: Circle system utilized Preoxygenation: Pre-oxygenation with 100% oxygen Induction Type: IV induction Ventilation: Oral airway inserted - appropriate to patient size and Mask ventilation without difficulty Laryngoscope Size: Mac and 4 Grade View: Grade I Tube type: Oral Tube size: 8.0 mm Number of attempts: 1 Airway Equipment and Method: Stylet Placement Confirmation: ETT inserted through vocal cords under direct vision,  positive ETCO2 and breath sounds checked- equal and bilateral Secured at: 24 cm Tube secured with: Tape Dental Injury: Teeth and Oropharynx as per pre-operative assessment

## 2017-02-05 NOTE — H&P (Signed)
[address]       [place] [address]             [phone]                    Jeffery Wood [place] Medical Record #[id number] Date of Birth: 1955-10-02  Referring:Dr Benay Spice Primary Care: Levin Erp, MD  Chief Complaint:    Lung Mass    History of Present Illness:    Jeffery Wood 62 y.o. male  Seen for  1-1/2 months increasing shortness of breath and chest discomfort with deep breath. Patient is previous smoker quit in 1990 . He has had increased wheezing, night sweats . No weight loss . 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  Family History - Prothrombin  Heterozygous II 202010 Mutation Factor 2 in brother  On Dec 24 Patient had EBUS and Bronchoscopy after extensive review by pathology after more than a week of study no conclusive pathologic diagnosis could be made: Diagnosis Lung, biopsy, right upper lobe - SQUAMOUS AND BRONCHIAL TISSUE WITH INFLAMMATION AND NECROSIS, SEE COMMENT. Microscopic Comment There is abundant necrosis with associated acute inflammation and lymphocytes. Immunohistochemistry reveals areas of T-cells (CD3, CD5, bcl-2). There are scattered B-cells (CD20, CD79a) associated with necrosis. CD10 reveals abundant background staining. Bcl-6 is negative. Ki-67 is focally elevated. Cytokeratin 5/6, pancytokeratin, synaptophysin, chromogranin, CD56, TTF-1, CD34, TdT and CD30. Overall, the findings are limited by abundant necrosis and associated mixed inflammation. Carcinoma is not identified, but a lymphoproliferative lesion is not entirely excluded. Re-biopsy for additional work up is recommended. Dr. Gari Crown has reviewed the case. The case was discussed with Dr. Benay Spice on 02/01/2017. Jeffery Males MD Current Activity/ Functional Status:  Since the previous biopsy the patient has had a PET scan and MRI of the brain.   Patient is independent with mobility/ambulation,  transfers, ADL's, IADL's.   Zubrod Score: At the time of surgery this patient's most appropriate activity status/level should be described as: _0     0    Normal activity, no symptoms _1     1    Restricted in physical strenuous activity but ambulatory, able to do out light work _2     2    Ambulatory and capable of self care, unable to do work activities, up and about               >50 % of waking hours                              _3     3    Only limited self care, in bed greater than 50% of waking hours _4     4    Completely disabled, no self care, confined to bed or chair _5     5    Moribund   Past Medical History:  Diagnosis Date  . Arthritis   . Asthma   . Cancer ([place])    basal cell of right temple; carcinoids  . Complication of anesthesia    hard to wake up from last procedure  . Diabetes ([place])   . GERD (gastroesophageal reflux disease)   . Headache   . Hypertension   . Sleep apnea     Past Surgical History:  Procedure Laterality Date  . CARPAL TUNNEL RELEASE    . KNEE ARTHROSCOPY    .  LEFT HEART CATHETERIZATION WITH CORONARY ANGIOGRAM N/A 12/07/2010   Procedure: LEFT HEART CATHETERIZATION WITH CORONARY ANGIOGRAM;  Surgeon: Leonie Man, MD;  Location: [facility] CATH LAB;  Service: Cardiovascular;  Laterality: N/A;  . VIDEO BRONCHOSCOPY WITH ENDOBRONCHIAL ULTRASOUND N/A 01/24/2017   Procedure: VIDEO BRONCHOSCOPY WITH ENDOBRONCHIAL ULTRASOUND with TRANSBRONCHIAL BIOPSY;  Surgeon: Grace Isaac, MD;  Location: [facility] OR;  Service: Thoracic;  Laterality: N/A;    Family History  Problem Relation Age of Onset  . Emphysema Mother   . Asthma Mother   . Heart disease Father     Social History   Socioeconomic History  . Marital status: Married    Spouse name: Not on file  . Number of children: Not on file  . Years of education: Not on file  . Highest education level: Not on file  Social Needs  . Financial resource strain: Not on file  . Food insecurity - worry: Not on  file  . Food insecurity - inability: Not on file  . Transportation needs - medical: Not on file  . Transportation needs - non-medical: Not on file  Occupational History  . 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 Smoking status: Former Smoker    Packs/day: 3.00    Years: 20.00    Pack years: 60.00    Types: Cigarettes    Last attempt to quit: 01/31/1988    Years since quitting: 28.9  . Smokeless tobacco: Never Used  Substance and Sexual Activity  . Alcohol use: No  . Drug use: No  . Sexual activity: Not on file  Other Topics Concern  . Not on file  Social History Narrative  . Not on file    Social History   Tobacco Use  Smoking Status Former Smoker  . Packs/day: 3.00  . Years: 20.00  . Pack years: 60.00  . Types: Cigarettes  . Last attempt to quit: 01/31/1988  . Years since quitting: 29.0  Smokeless Tobacco Never Used    Social History   Substance and Sexual Activity  Alcohol Use No     No Known Allergies  Current Facility-Administered Medications  Medication Dose Route Frequency Provider Last Rate Last Dose  . cefUROXime (ZINACEF) 1.5 g in dextrose 5 % 50 mL IVPB  1.5 g Intravenous 60 min Pre-Op Grace Isaac, MD      . chlorhexidine (HIBICLENS) 4 % liquid 4 application  60 mL Topical Once Grace Isaac, MD       And  . Derrill Memo ON 02/06/2017] chlorhexidine (HIBICLENS) 4 % liquid 4 application  60 mL Topical Once Grace Isaac, MD        Pertinent items are noted in HPI.   Review of Systems:     Cardiac Review of Systems: [Y] = yes  or   [  ] = no   Chest Pain [  y  ]  Resting SOB [ y] Exertional SOB  [ y ]  Vertell Limber Florencio.Farrier ]   Pedal Edema [n  ]    Palpitations Florencio.Farrier  ] Syncope  Florencio.Farrier ]   Presyncope [ n  ]  General Review of Systems: [Y] = yes [  ]=no Constitional: recent weight change [ y ];  Wt loss over the last 3 months [ 5  ] anorexia [  ]; fatigue [ y ]; nausea [  ]; night sweats Blue.Reese  ]; fever [n ]; or chills  [  ];  Dental: poor dentition[  ]; Last Dentist visit:   Eye : blurred vision [  ]; diplopia [   ]; vision changes [  ];  Amaurosis fugax[  ]; Resp: cough [ y ];  wheezing[y  ];  hemoptysis[n ]; shortness of breath[ y ]; paroxysmal nocturnal dyspnea[ y ]; dyspnea on exertion[  ]; or orthopnea[  ];  GI:  gallstones[  ], vomiting[n  ];  dysphagia[  ]; melena[  ];  hematochezia [  ]; heartburn[  ];   Hx of  Colonoscopy[y  ]; GU: kidney stones [  ]; hematuria[  ];   dysuria [  ];  nocturia[  ];  history of     obstruction [  ]; urinary frequency [  ]             Skin: rash, swelling[  ];, hair loss[  ];  peripheral edema[  ];  or itching[  ]; Musculosketetal: myalgias[  ];  joint swelling[  ];  joint erythema[  ];  joint pain[  ];  back pain[  ];  Heme/Lymph: bruising[  ];  bleeding[  ];  anemia[  ];  Neuro: TIA[n ];  headaches[  ];  stroke[  ];  vertigo[  ];  seizures[  ];   paresthesias[  ];  difficulty walking[  ];  Psych:depression[ n ]; anxiety[  ];  Endocrine: diabetes[y   PHYSICAL EXAMINATION: General appearance: alert, cooperative, appears stated age and no distress Head: Normocephalic, without obvious abnormality, atraumatic Neck: no adenopathy, no carotid bruit, no JVD, supple, symmetrical, trachea midline and thyroid not enlarged, symmetric, no tenderness/mass/nodules Lymph nodes: Cervical, supraclavicular, and axillary nodes normal. Resp: diminished breath sounds RUL Back: negative, symmetric, no curvature. ROM normal. No CVA tenderness. Cardio: regular rate and rhythm, S1, S2 normal, no murmur, click, rub or gallop GI: soft, non-tender; bowel sounds normal; no masses,  no organomegaly Extremities: extremities normal, atraumatic, no cyanosis or edema and Homans sign is negative, no sign of DVT Neurologic: Grossly normal The right groin lymph node noted on PET scan is not  Palpable  Diagnostic Studies & Laboratory data:     Recent Radiology Findings:   Dg Chest 2  View  Result Date: 01/24/2017 CLINICAL DATA:  Preoperative evaluation for hilar lesion biopsy EXAM: CHEST  2 VIEW COMPARISON:  Chest radiograph January 05, 2017 and chest CT January 19, 2017 FINDINGS: There is a persistent mass arising in the right hilar region with extension into the right paratracheal region. This mass measures approximately 11.7 x 7.5 cm by radiography. It appears stable compared to recent studies. There is atelectatic change in the right mid lung and left base regions. No new opacity evident. Heart size and pulmonary vascularity are normal. No new adenopathy evident. No bone lesions appreciable beyond degenerative change in each shoulder. IMPRESSION: Large right hilar/perihilar and and right paratracheal mass, unchanged from recent imaging examinations. No pneumothorax. Atelectatic change right mid lung and left base, stable. No new opacity. Stable cardiac silhouette. Electronically Signed   By: Lowella Grip III M.D.   On: 01/24/2017 07:26   Ct Chest W Contrast  Result Date: 01/19/2017 CLINICAL DATA:  Abnormal chest radiograph. History of carcinoid tumor. Chest pain scapular pain. EXAM: CT CHEST WITH CONTRAST TECHNIQUE: Multidetector CT imaging of the chest was performed during intravenous contrast administration. CONTRAST:  4m ISOVUE-300 IOPAMIDOL (ISOVUE-300) INJECTION 61% COMPARISON:  Chest radiograph 01/15/2017, CT abdomen 08/31/2005 FINDINGS: Cardiovascular: Mediastinal mass compresses the superior vena cava from the level the  brachycephalic vein to the RIGHT atrium. The aorta and its branches are normal. Trace pericardial thickening. Mediastinum/Nodes: Bulky mediastinal mass centered along the superior vena cava measures 10 x 8.0 cm (image 58, series 2). Subcarinal lymph node measures 1.9 cm short axis. RIGHT hilar adenopathy is contiguous with the mediastinal mass. Lungs/Pleura: RIGHT suprahilar mass obstructs the RIGHT upper lobe bronchus with obstructive collapse. Mass  is contiguous with the mediastinal mass described above. Triangular nodule in the lateral RIGHT upper lobe may represent post obstructive collapse or tumor spread Upper Abdomen: Limited view of the liver, kidneys, pancreas are unremarkable. Normal adrenal glands. Musculoskeletal: No aggressive osseous lesion. IMPRESSION: 1. RIGHT suprahilar mass which obstructs the RIGHT upper lobe bronchus and extends into the right mediastinum consistent with bronchogenic carcinoma. Consider small cell carcinoma. 2. Mass surrounds and constricts the superior vena cava from the level of the brachycephalic vein to the RIGHT atrium. 3. Subcarinal adenopathy. 4. No evidence of distant metastatic disease on CT thorax. Consider FDG PET scan for further staging. Electronically Signed   By: Suzy Bouchard M.D.   On: 01/19/2017 16:02   Mr Jeri Cos AJ Contrast  Result Date: 02/03/2017 CLINICAL DATA:  Right-sided mediastinal mass concerning for malignancy with evidence of metastatic disease on PET-CT. Staging. EXAM: MRI HEAD WITHOUT AND WITH CONTRAST TECHNIQUE: Multiplanar, multiecho pulse sequences of the brain and surrounding structures were obtained without and with intravenous contrast. CONTRAST:  40m MULTIHANCE GADOBENATE DIMEGLUMINE 529 MG/ML IV SOLN COMPARISON:  02/20/2011 brain MRI.  02/27/2011 neck CTA. FINDINGS: Brain: There is no evidence of acute infarct, intracranial hemorrhage, mass, midline shift, or extra-axial fluid collection. There is mild cerebral atrophy. The brain is normal in signal. No abnormal enhancement is identified. Vascular: Chronically abnormal appearance of the distal left vertebral artery consistent with occlusion on CTA. Other major intracranial vascular flow voids are preserved. Skull and upper cervical spine: Unremarkable bone marrow signal. Sinuses/Orbits: Unremarkable orbits. Trace left mastoid fluid. Minimal right maxillary sinus mucosal thickening. Other: None. IMPRESSION: 1. No evidence of  intracranial metastases or acute abnormality. 2. Mild cerebral atrophy. Electronically Signed   By: ALogan BoresM.D.   On: 02/03/2017 17:17   Nm Pet Image Initial (pi) Skull Base To Thigh  Result Date: 02/02/2017 CLINICAL DATA:  Initial treatment strategy for evaluate mediastinal mass. History carcinoid tumor. Indeterminate mediastinal biopsy. EXAM: NUCLEAR MEDICINE PET SKULL BASE TO THIGH TECHNIQUE: Thirteen point to mCi F-18 FDG was injected intravenously. Full-ring PET imaging was performed from the skull base to thigh after the radiotracer. CT data was obtained and used for attenuation correction and anatomic localization. FASTING BLOOD GLUCOSE:  Value: 79 mg/dl COMPARISON:  Chest CT 01/19/2017. No prior PET. abdominopelvic CT of 08/31/2004 is reviewed. FINDINGS: NECK: No areas of abnormal hypermetabolism.  No cervical adenopathy. CHEST: Hypermetabolism corresponding to the right-sided mediastinal mass. This measures on the order of 12.4 x 10.5 cm and a S.U.V. max of 27.9 on image 105/ series 3. Enlarged from 10.2 x 8.1 cm on the prior diagnostic CT of 01/19/2017. More cephalad right paratracheal node measures 1.4 cm and a S.U.V. max of 3.2 on image 80/ series 3. Compare 9 mm on the prior. Areas of focal pericardial hypermetabolism are suspicious for disease involvement. Example anteriorly at a S.U.V. max of 5.3, corresponding to possible pericardial nodularity. Image 120/ series 3. Trace right pleural thickening. Right upper lobe postobstructive atelectasis. Lad coronary artery atherosclerosis. ABDOMEN/PELVIS: Marked hypermetabolism involving the gastric body. Wall thickening which could be at least  partially due to underdistention. Example at a S.U.V. 25.1, including on image 146/series 3. Inferior genu duodenal wall thickening and hypermetabolism, including at a S.U.V. max of 26.7 on image 195/series 3. Foci of large and small bowel hypermetabolism, corresponding to wall thickening. Example within the  jejunum at a S.U.V. max of 27.7 on image 190/series 3. Left periaortic node which measures 1.6 cm and a S.U.V. max of 14.4 on image 196/series 3. A right inguinal node measures 10 mm and a S.U.V. max of 10.1 on image 271/series 3. Abdominal aortic atherosclerosis. No bowel obstruction. Scattered colonic diverticula. Tiny bilateral fat containing inguinal hernias. SKELETON: Osseous metastasis. Dominant right proximal humerus lesion is relatively CT occult and measures a S.U.V. max of 18.1, including on image 53/ series 3. A left acetabular lesion measures a S.U.V. max of 14.8 on image 263/series 3. IMPRESSION: 1. Dominant right mediastinal mass, which could represent small cell bronchogenic carcinoma or lymphoma. This is progressive since the prior diagnostic CT of 01/19/2017. Recommend clinical exclusion of IVC syndrome, which is suspected based on imaging appearance. 2. Metastatic disease/lymphoma within the bones, pericardium, stomach, bowel, and abdominopelvic nodal stations. This atypical distribution favors lymphoma over primary bronchogenic carcinoma. 3. Coronary artery atherosclerosis. Aortic Atherosclerosis (ICD10-I70.0). Electronically Signed   By: Abigail Miyamoto M.D.   On: 02/02/2017 10:55   I have independently reviewed the above radiology studies  and reviewed the findings with the patient.   Recent Lab Findings: Lab Results  Component Value Date   WBC 10.4 02/05/2017   HGB 12.4 (L) 02/05/2017   HCT 39.6 02/05/2017   PLT 328 02/05/2017   GLUCOSE 95 01/24/2017   ALT 13 (L) 01/24/2017   AST 16 01/24/2017   NA 136 01/24/2017   K 4.3 01/24/2017   CL 101 01/24/2017   CREATININE 0.73 01/24/2017   BUN 13 01/24/2017   CO2 26 01/24/2017   INR 1.09 01/24/2017   HGBA1C 5.3 01/24/2017      Assessment / Plan:   1/Extensive mediastinal involvement of mediastinal mass suggestive of small cell lung cancer or lymphoma , with svc involvement  And obstruction of right upper lobe bronchus , MRI of  brain- no brain mets  2/Diabetes 3/Gout 4/Asthma  I ave reviewed with patient the xray findings and discussed with oncology  recommended proceed with mediastinoscopy to get additional tissue now that the previous biopsy dis not reveal a definitive diagnosis.  The goals risks and alternatives of the planned surgical procedure Procedure(s): VIDEO MEDIASTINOSCOPY (N/A)  have been discussed with the patient in detail. The risks of the procedure including death, infection, stroke, myocardial infarction, bleeding, blood transfusion have all been discussed specifically.  I have quoted Lorenza Burton a 1 % of perioperative mortality and a complication rate as high as10 %. The patient's questions have been answered.Jeffery Wood is willing  to proceed with the planned procedure.   Grace Isaac MD      [address] [place] [address] Office [phone]   Beeper [phone]  02/05/2017 6:59 AM

## 2017-02-05 NOTE — Telephone Encounter (Signed)
Called patient wife to inform of schedule change to 1/9 at 11:15. Voiced understanding.

## 2017-02-05 NOTE — Brief Op Note (Signed)
      [address]       [place] [address]             [phone]     02/05/2017  9:26 AM  PATIENT:  Jeffery Wood  62 y.o. male  PRE-OPERATIVE DIAGNOSIS:  medistinal mass  POST-OPERATIVE DIAGNOSIS:  medistinal mass- frozen section favors lymphoma   PROCEDURE:  Procedure(s): VIDEO MEDIASTINOSCOPY (N/A)  SURGEON:  Surgeon(s) and Role:    * Grace Isaac, MD - Primary   ANESTHESIA:   general  EBL:  10 mL   BLOOD ADMINISTERED:none  DRAINS: none   LOCAL MEDICATIONS USED:  NONE  SPECIMEN:  Source of Specimen:  # 7 nodes   DISPOSITION OF SPECIMEN:  PATHOLOGY  COUNTS:  YES  DICTATION: .Dragon Dictation  PLAN OF CARE: Discharge to home after PACU  PATIENT DISPOSITION:  PACU - hemodynamically stable.   Delay start of Pharmacological VTE agent (>24hrs) due to surgical blood loss or risk of bleeding: yes

## 2017-02-05 NOTE — Progress Notes (Signed)
Pharmacy Tech paged for medication review

## 2017-02-05 NOTE — Anesthesia Postprocedure Evaluation (Signed)
Anesthesia Post Note  Patient: Jeffery Wood  Procedure(s) Performed: VIDEO MEDIASTINOSCOPY (N/A )     Patient location during evaluation: PACU Anesthesia Type: General Level of consciousness: awake and alert Pain management: pain level controlled Vital Signs Assessment: post-procedure vital signs reviewed and stable Respiratory status: spontaneous breathing, nonlabored ventilation, respiratory function stable and patient connected to nasal cannula oxygen Cardiovascular status: blood pressure returned to baseline and stable Postop Assessment: no apparent nausea or vomiting Anesthetic complications: no    Last Vitals:  Vitals:   02/05/17 1030 02/05/17 1045  BP: (!) 98/59 124/70  Pulse: 94 96  Resp: 20   Temp: 36.7 C   SpO2: 93% 96%                 [name]

## 2017-02-05 NOTE — Transfer of Care (Signed)
Immediate Anesthesia Transfer of Care Note  Patient: Jeffery Wood  Procedure(s) Performed: VIDEO MEDIASTINOSCOPY (N/A )  Patient Location: PACU  Anesthesia Type:General  Level of Consciousness: drowsy and patient cooperative  Airway & Oxygen Therapy: Patient Spontanous Breathing and Patient connected to face mask oxygen  Post-op Assessment: Report given to RN and Post -op Vital signs reviewed and stable  Post vital signs: Reviewed and stable  Last Vitals:  Vitals:   02/05/17 0555 02/05/17 0920  BP: 121/68   Pulse: 96 95  Resp: 18 20  Temp: 36.9 C 36.5 C  SpO2: 99%     Last Pain:  Vitals:   02/05/17 0638  TempSrc:   PainSc: 6       Patients Stated Pain Goal: 3 (60/60/04 5997)  Complications: No apparent anesthesia complications

## 2017-02-05 NOTE — Discharge Instructions (Signed)
Tissue Adhesive Wound Care Some cuts, wounds, lacerations, and incisions can be repaired by using tissue adhesive, also called skin glue. It holds the skin together so healing can happen faster. It forms a strong bond on the skin in about 1 minute, and it reaches its full strength in about 2-3 minutes. The adhesive disappears naturally while the wound is healing. It is important to take proper care of your wound at home while it heals. Follow these instructions at home: Wound care  Showers are allowed after the first 24 hours. Do not soak the area where the tissue adhesive was placed. Do not take baths, swim, or use hot tubs. Do not use any soaps, petroleum jelly products, or ointments on the wound. Certain ointments can weaken the glue.  If a bandage (dressing) has been applied, keep it dry.  Follow instructions from your health care provider about how often to change the dressing. ? Wash your hands with soap and water before you change your dressing. If soap and water are not available, use hand sanitizer. ? Change your dressing as told by your health care provider. ? Leave tissue adhesive in place. It will fall off on its own after 7-10 days.  Do not scratch, rub, or pick at the adhesive.  Do not place tape over the adhesive. The adhesive could come off of the wound when you pull the tape off.  Protect the wound from further injury until it is healed.  Protect the wound from sun and tanning bed exposure while it is healing and for several weeks after healing. General instructions  Take over-the-counter and prescription medicines only as told by your health care provider.  Keep all follow-up visits as told by your health care provider. This is important. Get help right away if:  Your wound reopens and is draining.  Your wound becomes red, swollen, hot, or tender.  You develop a rash after the glue is applied.  You have increasing pain in the wound.  You have a red streak going  away from the wound.  You have pus coming from the wound.  You have increased bleeding.  You have a fever.  You have shaking chills.  You notice a bad smell coming from the wound.  Your wound or the adhesive breaks open. This information is not intended to replace advice given to you by your health care provider. Make sure you discuss any questions you have with your health care provider. Document Released: 07/12/2000 Document Revised: 12/10/2015 Document Reviewed: 12/10/2015 Elsevier Interactive Patient Education  2017 [employer].

## 2017-02-06 ENCOUNTER — Encounter (HOSPITAL_COMMUNITY): Payer: Self-pay | Admitting: Cardiothoracic Surgery

## 2017-02-06 NOTE — Op Note (Signed)
NAME:  Jeffery Wood, Jeffery Wood                     ACCOUNT NO.:  MEDICAL RECORD NO.:  [id number]  LOCATION:                                 FACILITY:  PHYSICIAN:  Lanelle Bal, MD         DATE OF BIRTH:  DATE OF PROCEDURE:  02/05/2017 DATE OF DISCHARGE:                              OPERATIVE REPORT   PREOPERATIVE DIAGNOSIS:  Mediastinal mass.  POSTOPERATIVE DIAGNOSIS:  Mediastinal mass.  PROCEDURE:  Frozen section consistent with lymphoma.  Final path pending.  PROCEDURE PERFORMED:  Mediastinoscopy.  SURGEON:  Lanelle Bal, MD.  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  At the time of the initial biopsy, EBUS demonstrated tissue of both from #7 node and endobronchial mass in the right upper lobe was sent.  After multiple stains and examination by Pathology, definite type of malignancy could not be determined.  PET scan had been performed.  The mediastinal mass was strongly hypermetabolic.  In addition, some areas in the small bowel and a small right inguinal node were hypermetabolic.  After discussion with Oncology, we recommended to the patient to proceed with mediastinoscopy to obtain further diagnostic material for further pathologic studies to determine if this mass is lymphoma or small cell lung cancer and if lymphoma, what type.  The patient agreed and signed informed consent.  DESCRIPTION OF PROCEDURE:  The patient underwent general endotracheal anesthesia without incident.  Single-lumen endotracheal tube was placed. The neck was prepped with Betadine and draped in a sterile manner. Appropriate time-out was performed.  A small transverse incision was made in the suprasternal notch and dissection carried down to the pretracheal fascia.  The upper mediastinum was entered along the pretracheal fascia.  The  video mediastinoscope was then introduced into the upper mediastinum and dissection proceeded to the mass along the trachea.  A solid Soden fleshy mass was easily identified and multiple biopsies of this were obtained.  First, 2 biopsies were sent for frozen section.  Additional biopsies were sent and labeled #7 node and were examined by Pathology and adequate material was in Pathology's possession.  Additional biopsies from the same area were taken and placed in a third cup and also submitted for pathology.  With operative field hemostatic, the scope was then removed.  The muscle layer was closed with interrupted 3-0 Vicryl sutures.  Subcutaneous tissue with interrupted 3-0 Vicryl sutures and the skin edges closed with 4-0 subcuticular stitch.  Dermabond was applied.  Sponge and needle count was reported as correct at the completion of procedure.  Blood loss was minimal.  The patient tolerated the procedure without obvious complication, was extubated in the operating room, transferred to the recovery room for postoperative care.     Lanelle Bal, MD     EG/MEDQ  D:  02/06/2017  T:  02/06/2017  Job:  [id number]

## 2017-02-07 ENCOUNTER — Telehealth: Payer: Self-pay

## 2017-02-07 ENCOUNTER — Ambulatory Visit: Payer: BC Managed Care – PPO | Admitting: Nurse Practitioner

## 2017-02-07 ENCOUNTER — Other Ambulatory Visit: Payer: Self-pay

## 2017-02-07 ENCOUNTER — Encounter (HOSPITAL_COMMUNITY): Payer: Self-pay

## 2017-02-07 ENCOUNTER — Inpatient Hospital Stay (HOSPITAL_COMMUNITY)
Admission: EM | Admit: 2017-02-07 | Discharge: 2017-02-10 | DRG: 841 | Disposition: A | Discharge status: Home / Self Care | Payer: BC Managed Care – PPO | Attending: Oncology | Admitting: Oncology

## 2017-02-07 DIAGNOSIS — C8330 Diffuse large B-cell lymphoma, unspecified site: Secondary | ICD-10-CM

## 2017-02-07 DIAGNOSIS — T40605A Adverse effect of unspecified narcotics, initial encounter: Secondary | ICD-10-CM | POA: Diagnosis present

## 2017-02-07 DIAGNOSIS — M545 Low back pain: Secondary | ICD-10-CM | POA: Diagnosis present

## 2017-02-07 DIAGNOSIS — E119 Type 2 diabetes mellitus without complications: Secondary | ICD-10-CM | POA: Diagnosis present

## 2017-02-07 DIAGNOSIS — J449 Chronic obstructive pulmonary disease, unspecified: Secondary | ICD-10-CM | POA: Diagnosis present

## 2017-02-07 DIAGNOSIS — G893 Neoplasm related pain (acute) (chronic): Secondary | ICD-10-CM | POA: Diagnosis present

## 2017-02-07 DIAGNOSIS — Z85828 Personal history of other malignant neoplasm of skin: Secondary | ICD-10-CM

## 2017-02-07 DIAGNOSIS — Z87891 Personal history of nicotine dependence: Secondary | ICD-10-CM | POA: Diagnosis not present

## 2017-02-07 DIAGNOSIS — Z7984 Long term (current) use of oral hypoglycemic drugs: Secondary | ICD-10-CM | POA: Diagnosis not present

## 2017-02-07 DIAGNOSIS — Z5111 Encounter for antineoplastic chemotherapy: Secondary | ICD-10-CM | POA: Diagnosis not present

## 2017-02-07 DIAGNOSIS — K5903 Drug induced constipation: Secondary | ICD-10-CM | POA: Diagnosis present

## 2017-02-07 DIAGNOSIS — C8338 Diffuse large B-cell lymphoma, lymph nodes of multiple sites: Secondary | ICD-10-CM

## 2017-02-07 DIAGNOSIS — M109 Gout, unspecified: Secondary | ICD-10-CM | POA: Diagnosis present

## 2017-02-07 DIAGNOSIS — C8580 Other specified types of non-Hodgkin lymphoma, unspecified site: Secondary | ICD-10-CM

## 2017-02-07 DIAGNOSIS — I10 Essential (primary) hypertension: Secondary | ICD-10-CM | POA: Diagnosis present

## 2017-02-07 DIAGNOSIS — C8512 Unspecified B-cell lymphoma, intrathoracic lymph nodes: Principal | ICD-10-CM | POA: Diagnosis present

## 2017-02-07 DIAGNOSIS — I871 Compression of vein: Secondary | ICD-10-CM | POA: Diagnosis present

## 2017-02-07 DIAGNOSIS — G4733 Obstructive sleep apnea (adult) (pediatric): Secondary | ICD-10-CM | POA: Diagnosis present

## 2017-02-07 DIAGNOSIS — C8590 Non-Hodgkin lymphoma, unspecified, unspecified site: Secondary | ICD-10-CM | POA: Diagnosis present

## 2017-02-07 DIAGNOSIS — Z8249 Family history of ischemic heart disease and other diseases of the circulatory system: Secondary | ICD-10-CM

## 2017-02-07 DIAGNOSIS — K219 Gastro-esophageal reflux disease without esophagitis: Secondary | ICD-10-CM | POA: Diagnosis present

## 2017-02-07 DIAGNOSIS — Z7982 Long term (current) use of aspirin: Secondary | ICD-10-CM | POA: Diagnosis not present

## 2017-02-07 DIAGNOSIS — G8929 Other chronic pain: Secondary | ICD-10-CM | POA: Diagnosis present

## 2017-02-07 DIAGNOSIS — J9859 Other diseases of mediastinum, not elsewhere classified: Secondary | ICD-10-CM

## 2017-02-07 DIAGNOSIS — D4989 Neoplasm of unspecified behavior of other specified sites: Secondary | ICD-10-CM | POA: Diagnosis present

## 2017-02-07 DIAGNOSIS — Z825 Family history of asthma and other chronic lower respiratory diseases: Secondary | ICD-10-CM | POA: Diagnosis not present

## 2017-02-07 LAB — COMPREHENSIVE METABOLIC PANEL
ALT: 15 U/L — ABNORMAL LOW (ref 17–63)
AST: 21 U/L (ref 15–41)
Albumin: 3.2 g/dL — ABNORMAL LOW (ref 3.5–5.0)
Alkaline Phosphatase: 51 U/L (ref 38–126)
Anion gap: 8 (ref 5–15)
BUN: 15 mg/dL (ref 6–20)
CHLORIDE: 102 mmol/L (ref 101–111)
CO2: 27 mmol/L (ref 22–32)
Calcium: 8.8 mg/dL — ABNORMAL LOW (ref 8.9–10.3)
Creatinine, Ser: 0.69 mg/dL (ref 0.61–1.24)
Glucose, Bld: 94 mg/dL (ref 65–99)
POTASSIUM: 3.9 mmol/L (ref 3.5–5.1)
Sodium: 137 mmol/L (ref 135–145)
Total Bilirubin: 0.7 mg/dL (ref 0.3–1.2)
Total Protein: 6.4 g/dL — ABNORMAL LOW (ref 6.5–8.1)

## 2017-02-07 LAB — CBC
HCT: 39 % (ref 39.0–52.0)
HEMOGLOBIN: 12.3 g/dL — AB (ref 13.0–17.0)
MCH: 28.9 pg (ref 26.0–34.0)
MCHC: 31.5 g/dL (ref 30.0–36.0)
MCV: 91.5 fL (ref 78.0–100.0)
Platelets: 330 10*3/uL (ref 150–400)
RBC: 4.26 MIL/uL (ref 4.22–5.81)
RDW: 14.3 % (ref 11.5–15.5)
WBC: 12.3 10*3/uL — ABNORMAL HIGH (ref 4.0–10.5)

## 2017-02-07 LAB — GLUCOSE, CAPILLARY
GLUCOSE-CAPILLARY: 194 mg/dL — AB (ref 65–99)
GLUCOSE-CAPILLARY: 207 mg/dL — AB (ref 65–99)

## 2017-02-07 LAB — CBC WITH DIFFERENTIAL/PLATELET
BASOS ABS: 0.1 10*3/uL (ref 0.0–0.1)
Basophils Relative: 1 %
EOS PCT: 1 %
Eosinophils Absolute: 0.1 10*3/uL (ref 0.0–0.7)
HCT: 30.7 % — ABNORMAL LOW (ref 39.0–52.0)
Hemoglobin: 9.8 g/dL — ABNORMAL LOW (ref 13.0–17.0)
LYMPHS ABS: 1.1 10*3/uL (ref 0.7–4.0)
LYMPHS PCT: 12 %
MCH: 29.2 pg (ref 26.0–34.0)
MCHC: 31.9 g/dL (ref 30.0–36.0)
MCV: 91.4 fL (ref 78.0–100.0)
MONO ABS: 1.1 10*3/uL — AB (ref 0.1–1.0)
Monocytes Relative: 12 %
Neutro Abs: 7 10*3/uL (ref 1.7–7.7)
Neutrophils Relative %: 74 %
PLATELETS: 230 10*3/uL (ref 150–400)
RBC: 3.36 MIL/uL — ABNORMAL LOW (ref 4.22–5.81)
RDW: 14.3 % (ref 11.5–15.5)
WBC: 9.3 10*3/uL (ref 4.0–10.5)

## 2017-02-07 LAB — CREATININE, SERUM
CREATININE: 0.71 mg/dL (ref 0.61–1.24)
GFR calc Af Amer: >60 mL/min (ref >60)
GFR calc non Af Amer: >60 mL/min (ref >60)

## 2017-02-07 LAB — LACTATE DEHYDROGENASE: LDH: 138 U/L (ref 98–192)

## 2017-02-07 LAB — URIC ACID: URIC ACID, SERUM: 6.7 mg/dL (ref 4.4–7.6)

## 2017-02-07 MED ORDER — POLYETHYLENE GLYCOL 3350 17 G PO PACK
17.0000 g | PACK | Freq: Every day | ORAL | Status: DC
  Administered 2017-02-08 – 2017-02-10 (×3): 17 g via ORAL
  Filled 2017-02-07 (×2): qty 1

## 2017-02-07 MED ORDER — LEVALBUTEROL HCL 1.25 MG/0.5ML IN NEBU: 1.2500 mg | INHALATION_SOLUTION | Freq: Every day | RESPIRATORY_TRACT | Status: DC | PRN

## 2017-02-07 MED ORDER — ALLOPURINOL 300 MG PO TABS
300.0000 mg | ORAL_TABLET | Freq: Every day | ORAL | Status: DC
  Administered 2017-02-07 – 2017-02-10 (×4): 300 mg via ORAL
  Filled 2017-02-07 (×4): qty 1

## 2017-02-07 MED ORDER — PANTOPRAZOLE SODIUM 40 MG PO TBEC
40.0000 mg | DELAYED_RELEASE_TABLET | Freq: Every day | ORAL | Status: DC
  Administered 2017-02-07 – 2017-02-10 (×4): 40 mg via ORAL
  Filled 2017-02-07 (×4): qty 1

## 2017-02-07 MED ORDER — KETOROLAC TROMETHAMINE 15 MG/ML IJ SOLN: 15.0000 mg | Freq: Once | INTRAMUSCULAR | Status: DC

## 2017-02-07 MED ORDER — HYDROMORPHONE HCL 1 MG/ML IJ SOLN
1.0000 mg | Freq: Once | INTRAMUSCULAR | Status: AC
  Administered 2017-02-07: 1 mg via INTRAVENOUS
  Filled 2017-02-07: qty 1

## 2017-02-07 MED ORDER — DEXAMETHASONE SODIUM PHOSPHATE 4 MG/ML IJ SOLN
10.0000 mg | Freq: Once | INTRAMUSCULAR | Status: AC
  Administered 2017-02-07: 10 mg via INTRAVENOUS
  Filled 2017-02-07: qty 3

## 2017-02-07 MED ORDER — SODIUM CHLORIDE 0.9 % IV SOLN
INTRAVENOUS | Status: DC
  Administered 2017-02-09 – 2017-02-10 (×3): via INTRAVENOUS

## 2017-02-07 MED ORDER — METFORMIN HCL 500 MG PO TABS
1000.0000 mg | ORAL_TABLET | Freq: Two times a day (BID) | ORAL | Status: DC
  Administered 2017-02-07 – 2017-02-10 (×6): 1000 mg via ORAL
  Filled 2017-02-07 (×6): qty 2

## 2017-02-07 MED ORDER — HYDROMORPHONE HCL 1 MG/ML IJ SOLN
1.0000 mg | INTRAMUSCULAR | Status: AC | PRN
  Administered 2017-02-07 – 2017-02-08 (×4): 1 mg via INTRAVENOUS
  Filled 2017-02-07 (×4): qty 1

## 2017-02-07 MED ORDER — KETAMINE HCL-SODIUM CHLORIDE 100-0.9 MG/10ML-% IV SOSY
0.3000 mg/kg | PREFILLED_SYRINGE | Freq: Once | INTRAVENOUS | Status: AC
  Administered 2017-02-07: 35 mg via INTRAVENOUS
  Filled 2017-02-07: qty 10

## 2017-02-07 MED ORDER — TIOTROPIUM BROMIDE MONOHYDRATE 18 MCG IN CAPS
18.0000 ug | ORAL_CAPSULE | Freq: Every day | RESPIRATORY_TRACT | Status: DC
  Administered 2017-02-08 – 2017-02-09 (×2): 18 ug via RESPIRATORY_TRACT
  Filled 2017-02-07: qty 5

## 2017-02-07 MED ORDER — HYDROMORPHONE HCL 1 MG/ML IJ SOLN
1.0000 mg | INTRAMUSCULAR | Status: AC
  Administered 2017-02-07 (×2): 1 mg via INTRAVENOUS
  Filled 2017-02-07 (×2): qty 1

## 2017-02-07 MED ORDER — ENOXAPARIN SODIUM 60 MG/0.6ML ~~LOC~~ SOLN
60.0000 mg | SUBCUTANEOUS | Status: DC
  Administered 2017-02-07 – 2017-02-09 (×3): 60 mg via SUBCUTANEOUS
  Filled 2017-02-07 (×3): qty 0.6

## 2017-02-07 MED ORDER — DEXAMETHASONE SODIUM PHOSPHATE 10 MG/ML IJ SOLN: 10.0000 mg | Freq: Once | INTRAMUSCULAR | Status: DC

## 2017-02-07 MED ORDER — GLIMEPIRIDE 2 MG PO TABS
2.0000 mg | ORAL_TABLET | Freq: Every day | ORAL | Status: DC
  Administered 2017-02-08 – 2017-02-10 (×3): 2 mg via ORAL
  Filled 2017-02-07 (×3): qty 1

## 2017-02-07 MED ORDER — OXYCODONE HCL 5 MG PO TABS
5.0000 mg | ORAL_TABLET | ORAL | Status: DC | PRN
  Administered 2017-02-07 – 2017-02-08 (×4): 5 mg via ORAL
  Filled 2017-02-07 (×5): qty 1

## 2017-02-07 MED ORDER — KETOROLAC TROMETHAMINE 15 MG/ML IJ SOLN
15.0000 mg | Freq: Once | INTRAMUSCULAR | Status: AC
  Administered 2017-02-07: 15 mg via INTRAVENOUS
  Filled 2017-02-07: qty 1

## 2017-02-07 MED ORDER — LOSARTAN POTASSIUM 50 MG PO TABS
100.0000 mg | ORAL_TABLET | Freq: Every day | ORAL | Status: DC
Start: 2017-02-07 — End: 2017-02-10
  Administered 2017-02-07 – 2017-02-10 (×4): 100 mg via ORAL
  Filled 2017-02-07 (×4): qty 2

## 2017-02-07 MED ORDER — LEVALBUTEROL HCL 0.63 MG/3ML IN NEBU
0.6300 mg | INHALATION_SOLUTION | Freq: Four times a day (QID) | RESPIRATORY_TRACT | Status: DC | PRN
  Administered 2017-02-07 – 2017-02-09 (×3): 0.63 mg via RESPIRATORY_TRACT
  Filled 2017-02-07 (×2): qty 3

## 2017-02-07 MED ORDER — LEVALBUTEROL HCL 0.63 MG/3ML IN NEBU
INHALATION_SOLUTION | RESPIRATORY_TRACT | Status: AC
  Filled 2017-02-07: qty 3

## 2017-02-07 MED ORDER — MOMETASONE FURO-FORMOTEROL FUM 100-5 MCG/ACT IN AERO
2.0000 | INHALATION_SPRAY | Freq: Two times a day (BID) | RESPIRATORY_TRACT | Status: DC
  Administered 2017-02-07 – 2017-02-09 (×5): 2 via RESPIRATORY_TRACT
  Filled 2017-02-07: qty 8.8

## 2017-02-07 MED ORDER — CLONAZEPAM 0.5 MG PO TABS
0.5000 mg | ORAL_TABLET | Freq: Every day | ORAL | Status: DC
  Administered 2017-02-07 – 2017-02-09 (×3): 0.5 mg via ORAL
  Filled 2017-02-07 (×3): qty 1

## 2017-02-07 MED ORDER — PROCHLORPERAZINE MALEATE 10 MG PO TABS: 10.0000 mg | ORAL_TABLET | Freq: Four times a day (QID) | ORAL | Status: DC | PRN

## 2017-02-07 MED ORDER — SENNA 8.6 MG PO TABS
1.0000 | ORAL_TABLET | Freq: Two times a day (BID) | ORAL | Status: DC
  Administered 2017-02-07 – 2017-02-10 (×7): 8.6 mg via ORAL
  Filled 2017-02-07 (×7): qty 1

## 2017-02-07 MED ORDER — DEXAMETHASONE SODIUM PHOSPHATE 10 MG/ML IJ SOLN
20.0000 mg | Freq: Once | INTRAMUSCULAR | Status: AC
  Administered 2017-02-07: 20 mg via INTRAVENOUS
  Filled 2017-02-07: qty 2

## 2017-02-07 NOTE — ED Notes (Signed)
Patient currently tolerating PO fluids at this time. Denies pain. Spoke with MD, will hold next dose of dilaudid until pain returns.

## 2017-02-07 NOTE — H&P (Signed)
Admission History and Physical      Chief Complaint: Facial and neck swelling HPI: Jeffery Wood is a 62 year old man seen in initial consultation by Dr. Benay Spice 01/19/2017 for evaluation of a lung mass.  Chest x-ray 01/15/2017 showed a medial right upper lobe/perihilar mass.  Chest CT 01/19/2017 showed a mass constricting the superior vena cava, subcarinal adenopathy, right suprahilar/mediastinal mass.  On 01/24/2017 he underwent a bronchoscopy with transbronchial biopsy of level 7 node and biopsy of a right upper lobe lung mass by Dr. Servando Snare.  There was compression of the right mainstem bronchus.  A mass was noted in the right upper lobe.  Mediastinal lymph nodes were seen on EBUS.  Multiple biopsies of a level 7 node were obtained.  Brushings and a biopsy of the right upper lobe mass were obtained.  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  Final pathology was nondiagnostic.  PET scan on 02/02/2017 showed hypermetabolism corresponding to the right sided mediastinal mass, progressive since the CT 01/19/2017.  Metastatic disease/lymphoma within the bones, pericardium, stomach, bowel and abdominopelvic nodal stations.  Brain MRI 02/03/2017 showed no evidence of intracranial metastases or acute abnormality.  On 02/05/2017 he underwent a mediastinoscopy.  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  Molecular studies are pending.  Jeffery Wood presented to the emergency room this morning with facial and neck swelling.  He also has a sore throat and throat swelling.  No dysphagia.  He has mild dyspnea, no change from baseline.      Past Medical History:  Diagnosis Date  . Arthritis   . Asthma   . Cancer ([place])    basal cell of right temple; carcinoids  . Complication of anesthesia    hard to wake up from last procedure  . Diabetes ([place])   . GERD (gastroesophageal reflux disease)   . Headache   . Hypertension   . Sleep apnea     Past Surgical History:   Procedure Laterality Date  . CARPAL TUNNEL RELEASE    . KNEE ARTHROSCOPY    . LEFT HEART CATHETERIZATION WITH CORONARY ANGIOGRAM N/A 12/07/2010   Procedure: LEFT HEART CATHETERIZATION WITH CORONARY ANGIOGRAM;  Surgeon: Leonie Man, MD;  Location: [facility] CATH LAB;  Service: Cardiovascular;  Laterality: N/A;  . VIDEO BRONCHOSCOPY WITH ENDOBRONCHIAL ULTRASOUND N/A 01/24/2017   Procedure: VIDEO BRONCHOSCOPY WITH ENDOBRONCHIAL ULTRASOUND with TRANSBRONCHIAL BIOPSY;  Surgeon: Grace Isaac, MD;  Location: [facility];  Service: Thoracic;  Laterality: N/A;  . VIDEO MEDIASTINOSCOPY N/A 02/05/2017   Procedure: VIDEO MEDIASTINOSCOPY;  Surgeon: Grace Isaac, MD;  Location: [facility];  Service: Thoracic;  Laterality: N/A;     (Not in a hospital admission) Scheduled Meds: . allopurinol  300 mg Oral Daily  . clonazePAM  0.5 mg Oral QHS  . dexamethasone  10 mg Intravenous Once  . enoxaparin (LOVENOX) injection  40 mg Subcutaneous Q24H  . [START ON 02/08/2017] glimepiride  2 mg Oral Q breakfast  .  HYDROmorphone (DILAUDID) injection  1 mg Intravenous Q1 Hr x 3  . losartan  100 mg Oral Daily  . metFORMIN  1,000 mg Oral BID WC  . mometasone-formoterol  2 puff Inhalation BID  . pantoprazole  40 mg Oral Daily  . polyethylene glycol  17 g Oral Daily  . senna  1 tablet Oral BID  . tiotropium  18 mcg Inhalation Daily   Continuous Infusions: . sodium chloride     PRN Meds:.levalbuterol, oxyCODONE, prochlorperazine  No  Known Allergies  Family History  Problem Relation Age of Onset  . Emphysema Mother   . Asthma Mother   . Heart disease Father      reports that he quit smoking about 29 years ago. His smoking use included cigarettes. He has a 60.00 pack-year smoking history. he has never used smokeless tobacco. He reports that he does not drink alcohol or use drugs.  ROS: Facial and neck swelling this morning.  Also complains of a sore throat and throat swelling.  No dysphagia.  No change in baseline  dyspnea.  No fevers.  Night sweats.  Weight loss.  Chronic back pain.  Right arm pain.  No nausea.  Some constipation.  Neuropathy symptoms involving the feet.  Physical:  Blood pressure 126/85, pulse 100, temperature 97.8 F (36.6 C), temperature source Oral, resp. rate 14, SpO2 97 %.  General: HEENT: Erythema at the posterior palate, no thrush.  Neck without mass.  Healing mediastinoscopy incision.  Mild facial and bilateral low neck edema. Chest: No respiratory distress.  Decreased breath sounds at the right upper anterior chest.  The lungs are clear.   Cardiovascular: Regrular rate and rythm Abdomen:No mass, no hepatosplenomegaly Extremities:No leg or arm edema Neuro:alert, the motor exam is intact in the upper and lower extremities  Labs:  Results for orders placed or performed during the hospital encounter of 02/07/17 (from the past 48 hour(s))  CBC with Differential     Status: Abnormal   Collection Time: 02/07/17  9:10 AM  Result Value Ref Range   WBC 9.3 4.0 - 10.5 K/uL   RBC 3.36 (L) 4.22 - 5.81 MIL/uL   Hemoglobin 9.8 (L) 13.0 - 17.0 g/dL   HCT 30.7 (L) 39.0 - 52.0 %   MCV 91.4 78.0 - 100.0 fL   MCH 29.2 26.0 - 34.0 pg   MCHC 31.9 30.0 - 36.0 g/dL   RDW 14.3 11.5 - 15.5 %   Platelets 230 150 - 400 K/uL   Neutrophils Relative % 74 %   Neutro Abs 7.0 1.7 - 7.7 K/uL   Lymphocytes Relative 12 %   Lymphs Abs 1.1 0.7 - 4.0 K/uL   Monocytes Relative 12 %   Monocytes Absolute 1.1 (H) 0.1 - 1.0 K/uL   Eosinophils Relative 1 %   Eosinophils Absolute 0.1 0.0 - 0.7 K/uL   Basophils Relative 1 %   Basophils Absolute 0.1 0.0 - 0.1 K/uL  Comprehensive metabolic panel     Status: Abnormal   Collection Time: 02/07/17  9:10 AM  Result Value Ref Range   Sodium 137 135 - 145 mmol/L   Potassium 3.9 3.5 - 5.1 mmol/L   Chloride 102 101 - 111 mmol/L   CO2 27 22 - 32 mmol/L   Glucose, Bld 94 65 - 99 mg/dL   BUN 15 6 - 20 mg/dL   Creatinine, Ser 0.69 0.61 - 1.24 mg/dL   Calcium 8.8  (L) 8.9 - 10.3 mg/dL   Total Protein 6.4 (L) 6.5 - 8.1 g/dL   Albumin 3.2 (L) 3.5 - 5.0 g/dL   AST 21 15 - 41 U/L   ALT 15 (L) 17 - 63 U/L   Alkaline Phosphatase 51 38 - 126 U/L   Total Bilirubin 0.7 0.3 - 1.2 mg/dL   GFR calc non Af Amer >60 >60 mL/min   GFR calc Af Amer >60 >60 mL/min    Comment: (NOTE) The eGFR has been calculated using the CKD EPI equation. This calculation has not been  validated in all clinical situations. eGFR's persistently <60 mL/min signify possible Chronic Kidney Disease.    Anion gap 8 5 - 15   No results found.  Assessment/Plan  1. High-grade B-cell non-Hodgkin's lymphoma, molecular studies pending  Chest x-ray 01/15/2017 - medial right upper lobe/perihilar mass.    Chest CT 01/19/2017 - mass constricting the superior vena cava, subcarinal adenopathy, right suprahilar/mediastinal mass.    01/24/2017 status post bronchoscopy with transbronchial biopsy of level 7 node and biopsy of a right upper lobe lung mass by Dr. Servando Snare.  There was compression of the right mainstem bronchus.  A mass was noted in the right upper lobe.  Mediastinal lymph nodes were seen on EBUS.  Multiple biopsies of a level 7 node were obtained.  Brushings and a biopsy of the right upper lobe mass were obtained.  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  Final pathology was nondiagnostic.    PET scan on 02/02/2017 showed hypermetabolism corresponding to the right sided mediastinal mass, progressive since the CT 01/19/2017.  Metastatic disease/lymphoma within the bones, pericardium, stomach, bowel and abdominopelvic nodal stations.    Brain MRI 02/03/2017 showed no evidence of intracranial metastases or acute abnormality.    02/05/2017 status post mediastinoscopy.    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  Molecular studies are pending. 2. Chest/upper back pain likely secondary to #1 3. Exertional dyspnea secondary to #1 and  COPD 4. Asthma 5. Diabetes mellitus 6. Gout 7. Remote history of "carcinoid Wood" 8. Remote history of tobacco use 9. Basal cell carcinoma removed from the right face 10. Chronic low back pain status post epidural steroid injections-followed at a pain management clinic  Ned Card ANP/GNP-BC 02/07/2017, 1:53 PM   Jeffery Wood was interviewed and examined.  He has been diagnosed with high-grade non-Hodgkin's lymphoma.  He has advanced stage disease.  He presents today with early SVC Wood secondary to an obstructing upper chest mass.  He reports feeling better with decreased facial swelling after receiving Decadron in the emergency room.  I discussed the diagnosis and treatment options with Jeffery Wood and his family.  I recommend admission  to begin systemic therapy.  I discussed the case with Dr.Smir.  He indicates the histology is consistent with a large B-cell lymphoma, CD20 positive.  Molecular studies have been submitted to rule out Burkitt's or a double hit lymphoma.  I recommend proceeding with R-CHOP chemotherapy.  I reviewed the potential toxicities associated with this regimen in detail with Jeffery Wood and his family.  He understands the potential for nausea/vomiting, mucositis, diarrhea, alopecia, and hematologic toxicity.  We discussed the vesicant properties associated with doxorubicin and vincristine.  We discussed the cardiac toxicity seen with doxorubicin.  We discussed the chance for neuropathy.  We reviewed the allergic reaction, hepatitis reactivation, and CNS toxicity seen with rituximab.  He agrees to proceed.  The plan is to begin intravenous hydration and allopurinol today.  Cycle 1R-CHOP will be administered on 02/08/2017.  He will be referred for placement of a PICC to administer the first cycle of chemotherapy.  Mr. Fenoglio has early SVC Wood, but does not appear to require emergent radiation or chemotherapy today.  He will continue Decadron while preparing for  chemotherapy 02/08/2017.

## 2017-02-07 NOTE — ED Notes (Addendum)
Writer called into room, family placed patient into a chair and patient is dazed out of space, moaning and groaning. VSS. See chart. Charge nurse and MD called to bedside. Patient very diaphoretic and responsive at this time. 2 L of oxygen applied via nasal cannula as well. IV tubing and NS bolus hung at this time.

## 2017-02-07 NOTE — ED Provider Notes (Signed)
[facility] DEPT Provider Note   CSN: [id number] Arrival date & time: 02/07/17  2353     History   Chief Complaint Chief Complaint  Patient presents with  . Facial Swelling    HPI Jeffery Wood is a 62 y.o. male.  HPI  62 year old male with known history of mediastinal tumor comes in with chief complaint of facial swelling.  Patient reports that this morning when he woke up he noticed that his entire face was swollen, red and he was having some difficulty swallowing.  Patient had a mediastinoscopy 2 days ago, for evaluation of a tumor.  Patient denies any difficulty breathing, drooling, or voice change.  Patient reports that the swelling has gotten better over the past few minutes.    Past Medical History:  Diagnosis Date  . Arthritis   . Asthma   . Cancer ([place])    basal cell of right temple; carcinoids  . Complication of anesthesia    hard to wake up from last procedure  . Diabetes ([place])   . GERD (gastroesophageal reflux disease)   . Headache   . Hypertension   . Sleep apnea     Patient Active Problem List   Diagnosis Date Noted  . OSA (obstructive sleep apnea) 07/22/2013  . Chest pain, non-cardiac 12/07/2010  . Diabetes mellitus 12/07/2010    Past Surgical History:  Procedure Laterality Date  . CARPAL TUNNEL RELEASE    . KNEE ARTHROSCOPY    . LEFT HEART CATHETERIZATION WITH CORONARY ANGIOGRAM N/A 12/07/2010   Procedure: LEFT HEART CATHETERIZATION WITH CORONARY ANGIOGRAM;  Surgeon: Leonie Man, MD;  Location: [facility] CATH LAB;  Service: Cardiovascular;  Laterality: N/A;  . VIDEO BRONCHOSCOPY WITH ENDOBRONCHIAL ULTRASOUND N/A 01/24/2017   Procedure: VIDEO BRONCHOSCOPY WITH ENDOBRONCHIAL ULTRASOUND with TRANSBRONCHIAL BIOPSY;  Surgeon: Grace Isaac, MD;  Location: [facility] [place];  Service: Thoracic;  Laterality: N/A;  . VIDEO MEDIASTINOSCOPY N/A 02/05/2017   Procedure: VIDEO MEDIASTINOSCOPY;  Surgeon: Grace Isaac, MD;  Location: [facility]  OR;  Service: Thoracic;  Laterality: N/A;       Home Medications    Prior to Admission medications   Medication Sig Start Date End Date Taking? Authorizing Provider  acetaminophen (TYLENOL) 650 MG CR tablet Take 1,300 mg by mouth 2 (two) times daily as needed. For pain    Yes [provider]  aspirin EC 81 MG tablet Take 81 mg by mouth daily.     Yes [provider]  betamethasone dipropionate (DIPROLENE) 0.05 % cream Apply 1 application topically daily.     Yes [provider]  celecoxib (CELEBREX) 200 MG capsule Take 200 mg by mouth 2 (two) times daily. 01/01/17  Yes [provider]  clonazePAM (KLONOPIN) 0.5 MG tablet Take 0.5 mg by mouth at bedtime.     Yes [provider]  etodolac (LODINE) 400 MG tablet Take 400 mg by mouth 3 (three) times daily as needed. For gout pain    Yes [provider]  Fluticasone-Salmeterol (ADVAIR) 100-50 MCG/DOSE AEPB Inhale 1 puff into the lungs daily.     Yes [provider]  glimepiride (AMARYL) 4 MG tablet Take 2 mg by mouth 2 (two) times daily.    Yes [provider]  levalbuterol (XOPENEX HFA) 45 MCG/ACT inhaler Inhale 2 puffs into the lungs daily as needed for wheezing or shortness of breath.  11/01/16  Yes [provider]  loratadine (CLARITIN) 10 MG tablet Take 10 mg by mouth daily  as needed for allergies.    Yes [provider]  losartan (COZAAR) 100 MG tablet Take 100 mg by mouth daily. 12/26/16  Yes [provider]  metFORMIN (GLUCOPHAGE) 1000 MG tablet Take 1,000 mg by mouth 2 (two) times daily with a meal.     Yes [provider]  Multiple Vitamins-Minerals (MULTIVITAMINS THER. W/MINERALS) TABS Take 1 tablet by mouth daily.     Yes [provider]  omeprazole (PRILOSEC) 20 MG capsule Take 20 mg by mouth 2 (two) times daily.     Yes [provider]  OVER THE COUNTER MEDICATION Apply 1 application topically as needed  (Inflamed Psorasis). Topical Cream   Yes [provider]  oxyCODONE (OXY IR/ROXICODONE) 5 MG immediate release tablet Take 1 tablet (5 mg total) by mouth every 4 (four) hours as needed for severe pain. 02/02/17  Yes Ladell Pier, MD  polyethylene glycol [facility]) packet Take 17 g by mouth daily. 02/02/17  Yes Ladell Pier, MD  senna (SENOKOT) 8.6 MG TABS tablet Take 1 tablet (8.6 mg total) by mouth 2 (two) times daily. 02/02/17  Yes Ladell Pier, MD  tiotropium (SPIRIVA) 18 MCG inhalation capsule Place 18 mcg into inhaler and inhale daily.   Yes [provider]  traMADol (ULTRAM) 50 MG tablet Take 50 mg by mouth at bedtime as needed for moderate pain.    Yes [provider]  trolamine salicylate (ASPERCREME) 10 % cream Apply 1 application topically as needed for muscle pain.   Yes [provider]  oxycodone (OXY-IR) 5 MG capsule Take 1 capsule (5 mg total) by mouth every 4 (four) hours as needed. 02/05/17   Grace Isaac, MD    Family History Family History  Problem Relation Age of Onset  . Emphysema Mother   . Asthma Mother   . Heart disease Father     Social History Social History   Tobacco Use  . Smoking status: Former Smoker    Packs/day: 3.00    Years: 20.00    Pack years: 60.00    Types: Cigarettes    Last attempt to quit: 01/31/1988    Years since quitting: 29.0  . Smokeless tobacco: Never Used  Substance Use Topics  . Alcohol use: No  . Drug use: No     Allergies   Patient has no known allergies.   Review of Systems Review of Systems  Constitutional: Positive for activity change.  HENT: Negative for drooling, trouble swallowing and voice change.   Respiratory: Negative for wheezing.   Cardiovascular: Negative for chest pain.     Physical Exam Updated Vital Signs BP 125/81   Pulse (!) 102   Temp 97.8 F (36.6 C) (Oral)   Resp (!) 22   SpO2 98%   Physical Exam  Constitutional: He is oriented to person, place,  and time. He appears well-developed.  HENT:  Head: Atraumatic.  Neck: Neck supple.  Patient appears to have some swelling at the base of the neck on the right side, and swelling over the right shoulder.  No stridor  Cardiovascular: Normal rate.  Pulmonary/Chest: Effort normal. No stridor.  Incisional wound near the sternal notch appears to be healing well  Neurological: He is alert and oriented to person, place, and time.  Skin: Skin is warm.  Nursing note and vitals reviewed.    ED Treatments / Results  Labs (all labs ordered are listed, but only abnormal results are displayed) Labs Reviewed  CBC WITH  DIFFERENTIAL/PLATELET  COMPREHENSIVE METABOLIC PANEL    EKG  EKG Interpretation None       Radiology No results found.  Procedures Procedures (including critical care time)  Medications Ordered in ED Medications  HYDROmorphone (DILAUDID) injection 1 mg (not administered)  dexamethasone (DECADRON) injection 20 mg (not administered)     Initial Impression / Assessment and Plan / ED Course  I have reviewed the triage vital signs and the nursing notes.  Pertinent labs & imaging results that were available during my care of the patient were reviewed by me and considered in my medical decision making (see chart for details).     Pt comes in with cc of facial swelling. Patient appears to have a mediastinal mass, status post recent mediastinoscopy for diagnosing.  It appears that patient likely has lymphoma.  Patient has no respiratory distress. Patient will get IV Decadron. Spoke with Dr. Reche Dixon, Isaias Sakai - he recommends that pt be admitted.  Final Clinical Impressions(s) / ED Diagnoses   Final diagnoses:  Mediastinal mass    ED Discharge Orders    None       Varney Biles, MD 02/07/17 [phone]

## 2017-02-07 NOTE — ED Notes (Signed)
Family member stepped out of the room, dissatisfied because patient and family members state "Nothing has been done about his pain." Informed family that pain medication has been given on two separate occasions, as well as the application of heat packs. Patient reports pain is unchanged. Patient told family "I will just get up, put my clothes on and leave." Charge nurse and Dr. Kathrynn Humble aware.

## 2017-02-07 NOTE — ED Notes (Signed)
ED Provider at bedside. 

## 2017-02-07 NOTE — ED Triage Notes (Signed)
[facility] pt--unspecified lung cancer/mass. Noticed facial swelling this morning upon waking up. Denies any c/p, SHOB, tongue swelling or trouble swallowing.

## 2017-02-07 NOTE — Telephone Encounter (Signed)
Patient wife called and stated "I am on my way to the ER with Jeffery Wood". The wife stated that "his face is really swollen and he feels like his throat is swelling too". Dr. Benay Spice made aware and voiced agreement with patient going to the ED. Wife knows to call back with any further concerns.

## 2017-02-07 NOTE — ED Notes (Signed)
Patient currently responsive to family and Probation officer. He constantly keeps asking "what happened?" Plan of care reviewed with patient and family. Will continue to monitor patient. Side effects of medication explained to patient and family.

## 2017-02-08 ENCOUNTER — Inpatient Hospital Stay (HOSPITAL_COMMUNITY): Payer: BC Managed Care – PPO

## 2017-02-08 ENCOUNTER — Telehealth: Payer: Self-pay

## 2017-02-08 DIAGNOSIS — Z5111 Encounter for antineoplastic chemotherapy: Secondary | ICD-10-CM

## 2017-02-08 LAB — GLUCOSE, CAPILLARY
Glucose-Capillary: 223 mg/dL — ABNORMAL HIGH (ref 65–99)
Glucose-Capillary: 154 mg/dL — ABNORMAL HIGH (ref 65–99)
Glucose-Capillary: 92 mg/dL (ref 65–99)
Glucose-Capillary: 133 mg/dL — ABNORMAL HIGH (ref 65–99)

## 2017-02-08 LAB — HIV ANTIBODY (ROUTINE TESTING W REFLEX): HIV Screen 4th Generation wRfx: NONREACTIVE

## 2017-02-08 LAB — ECHOCARDIOGRAM COMPLETE
Height: 75 in
Weight: 4102.32 oz

## 2017-02-08 LAB — HEPATITIS B CORE ANTIBODY, TOTAL: Hep B Core Total Ab: NEGATIVE

## 2017-02-08 LAB — HEPATITIS C ANTIBODY: HCV Ab: <0.1 s/co ratio (ref 0.0–0.9)

## 2017-02-08 LAB — HEPATITIS B SURFACE ANTIGEN: Hepatitis B Surface Ag: NEGATIVE

## 2017-02-08 MED ORDER — FAMOTIDINE IN NACL 20-0.9 MG/50ML-% IV SOLN
20.0000 mg | Freq: Once | INTRAVENOUS | Status: DC | PRN
  Filled 2017-02-08: qty 50

## 2017-02-08 MED ORDER — DIPHENHYDRAMINE HCL 50 MG/ML IJ SOLN: 25.0000 mg | Freq: Once | INTRAMUSCULAR | Status: DC | PRN

## 2017-02-08 MED ORDER — VINCRISTINE SULFATE CHEMO INJECTION 1 MG/ML
2.0000 mg | Freq: Once | INTRAVENOUS | Status: AC
  Administered 2017-02-09: 2 mg via INTRAVENOUS
  Filled 2017-02-08: qty 2

## 2017-02-08 MED ORDER — HOT PACK MISC ONCOLOGY
1.0000 | Freq: Once | Status: AC | PRN
  Filled 2017-02-08: qty 1

## 2017-02-08 MED ORDER — SODIUM CHLORIDE 0.9 % IV SOLN
375.0000 mg/m2 | Freq: Once | INTRAVENOUS | Status: AC
  Administered 2017-02-08: 900 mg via INTRAVENOUS
  Filled 2017-02-08: qty 50

## 2017-02-08 MED ORDER — HEPARIN SOD (PORK) LOCK FLUSH 100 UNIT/ML IV SOLN: 500.0000 [IU] | Freq: Once | INTRAVENOUS | Status: DC | PRN

## 2017-02-08 MED ORDER — DIPHENHYDRAMINE HCL 50 MG PO CAPS
50.0000 mg | ORAL_CAPSULE | Freq: Once | ORAL | Status: AC
  Administered 2017-02-08: 50 mg via ORAL
  Filled 2017-02-08: qty 1

## 2017-02-08 MED ORDER — SODIUM CHLORIDE 0.9% FLUSH: 10.0000 mL | INTRAVENOUS | Status: DC | PRN

## 2017-02-08 MED ORDER — EPINEPHRINE PF 1 MG/10ML IJ SOSY: 0.2500 mg | PREFILLED_SYRINGE | Freq: Once | INTRAMUSCULAR | Status: DC | PRN

## 2017-02-08 MED ORDER — LIDOCAINE HCL 1 % IJ SOLN
INTRAMUSCULAR | Status: AC
  Filled 2017-02-08: qty 20

## 2017-02-08 MED ORDER — SODIUM CHLORIDE 0.9 % IV SOLN
750.0000 mg/m2 | Freq: Once | INTRAVENOUS | Status: AC
  Administered 2017-02-09: 1860 mg via INTRAVENOUS
  Filled 2017-02-08 (×2): qty 93

## 2017-02-08 MED ORDER — COLD PACK MISC ONCOLOGY
1.0000 | Freq: Once | Status: AC | PRN
  Filled 2017-02-08: qty 1

## 2017-02-08 MED ORDER — LIDOCAINE HCL 1 % IJ SOLN
INTRAMUSCULAR | Status: DC | PRN
  Administered 2017-02-08: 5 mL via INTRADERMAL

## 2017-02-08 MED ORDER — SORBITOL 70 % SOLN
30.0000 mL | Freq: Every day | Status: DC | PRN
  Administered 2017-02-09: 60 mL via ORAL
  Filled 2017-02-08: qty 60

## 2017-02-08 MED ORDER — EPINEPHRINE PF 1 MG/ML IJ SOLN
0.5000 mg | Freq: Once | INTRAMUSCULAR | Status: DC | PRN
  Filled 2017-02-08: qty 1

## 2017-02-08 MED ORDER — BISACODYL 5 MG PO TBEC
5.0000 mg | DELAYED_RELEASE_TABLET | Freq: Every day | ORAL | Status: DC | PRN
  Administered 2017-02-08: 10 mg via ORAL
  Filled 2017-02-08 (×2): qty 2

## 2017-02-08 MED ORDER — SODIUM CHLORIDE 0.9 % IV SOLN: Freq: Once | INTRAVENOUS | Status: DC

## 2017-02-08 MED ORDER — ACETAMINOPHEN 325 MG PO TABS
650.0000 mg | ORAL_TABLET | Freq: Once | ORAL | Status: AC
  Administered 2017-02-08: 650 mg via ORAL
  Filled 2017-02-08: qty 2

## 2017-02-08 MED ORDER — HYDROMORPHONE HCL 1 MG/ML IJ SOLN
0.5000 mg | INTRAMUSCULAR | Status: DC | PRN
  Administered 2017-02-08 – 2017-02-09 (×6): 1 mg via INTRAVENOUS
  Filled 2017-02-08 (×6): qty 1

## 2017-02-08 MED ORDER — ALTEPLASE 2 MG IJ SOLR
2.0000 mg | Freq: Once | INTRAMUSCULAR | Status: DC | PRN
  Filled 2017-02-08: qty 2

## 2017-02-08 MED ORDER — SODIUM CHLORIDE 0.9 % IV SOLN
10.0000 mg | Freq: Once | INTRAVENOUS | Status: AC
  Administered 2017-02-09: 10 mg via INTRAVENOUS
  Filled 2017-02-08: qty 1

## 2017-02-08 MED ORDER — DOXORUBICIN HCL CHEMO IV INJECTION 2 MG/ML
50.0000 mg/m2 | Freq: Once | INTRAVENOUS | Status: AC
  Administered 2017-02-09: 124 mg via INTRAVENOUS
  Filled 2017-02-08: qty 62

## 2017-02-08 MED ORDER — DIPHENHYDRAMINE HCL 50 MG/ML IJ SOLN: 50.0000 mg | Freq: Once | INTRAMUSCULAR | Status: DC | PRN

## 2017-02-08 MED ORDER — SODIUM CHLORIDE 0.9 % IV SOLN: Freq: Once | INTRAVENOUS | Status: DC | PRN

## 2017-02-08 MED ORDER — SODIUM CHLORIDE 0.9% FLUSH: 3.0000 mL | INTRAVENOUS | Status: DC | PRN

## 2017-02-08 MED ORDER — PALONOSETRON HCL INJECTION 0.25 MG/5ML
0.2500 mg | Freq: Once | INTRAVENOUS | Status: AC
  Administered 2017-02-09: 0.25 mg via INTRAVENOUS
  Filled 2017-02-08: qty 5

## 2017-02-08 MED ORDER — METHYLPREDNISOLONE SODIUM SUCC 125 MG IJ SOLR: 125.0000 mg | Freq: Once | INTRAMUSCULAR | Status: DC | PRN

## 2017-02-08 MED ORDER — ALBUTEROL SULFATE (2.5 MG/3ML) 0.083% IN NEBU: 2.5000 mg | INHALATION_SOLUTION | Freq: Once | RESPIRATORY_TRACT | Status: DC | PRN

## 2017-02-08 MED ORDER — HEPARIN SOD (PORK) LOCK FLUSH 100 UNIT/ML IV SOLN: 250.0000 [IU] | Freq: Once | INTRAVENOUS | Status: DC | PRN

## 2017-02-08 NOTE — Progress Notes (Signed)
*  PRELIMINARY RESULTS* 2D Echocardiogram has been performed.  Darlina Sicilian M 02/08/2017, 8:35 AM

## 2017-02-08 NOTE — Progress Notes (Signed)
Rituxan rate increased to 100 mg/hr at 1646 and patient tolerating well. Will continue to monitor.

## 2017-02-08 NOTE — Progress Notes (Addendum)
Rituxan infusions started at 1230 at 50 mg/hr. Rate increased to 100 mg/hr at 1310. Patient c/o feeling hot and Arm hurting and was flushed. Rate returned to 50 mg/hr and Dr Benay Spice notified. Instructed to hold infusion and restart after patients pain decreases and increase slowly if patient can tolerate no higher than 100 mg/hr. Will continue to monitor.

## 2017-02-08 NOTE — Telephone Encounter (Signed)
Received call from [place], [place] on 3W. Reported that patient is receiving Rituxan. Rate increase from 50mg  to 100mg . RN noted that patient was "hot and appears flushed". Also states that patient "c/o R arm swelling and pain since the PICC got placed and that his wife notes his left arm looks notably more swollen". This RN consulted with Dr. Benay Spice.   Per Dr. Benay Spice, ok to resume at previous rate of 50mg  for 20-66mins if symptoms resolved. Cindy voiced understanding.   Received call from [place], RN stating that Rituxan rate has been decreased. Patient "vital signs are stable". Per Jenny Reichmann, patient "still hot and flushed and complaining of pain across chest and up in to throat", patient denies difficulty breathing. Jenny Reichmann, RN advised to pause infusion and page Dr. Benay Spice. Dr. Benay Spice to hospital to assess.

## 2017-02-08 NOTE — Procedures (Signed)
Pre procedural Diagnosis: Poor venous access Post Procedural Diagnosis: Same  Successful placement of right basilic vein approach 41 cm dual lumen PICC line with tip at the superior caval-atrial junction.    EBL: None  No immediate post procedural complication.  The PICC line is ready for immediate use.  Ronny Bacon, MD Pager #: [phone]

## 2017-02-08 NOTE — Progress Notes (Addendum)
MEDICATION-RELATED CONSULT NOTE   IR Procedure Consult - Anticoagulant/Antiplatelet PTA/Inpatient Med List Review by Pharmacist    Procedure: dual lumen PICC placement    Completed: 02/08/17 at 09:54  Post-Procedural bleeding risk per IR MD assessment:  low  Antithrombotic medications on inpatient or PTA profile prior to procedure:     Day 0  (at least 4 hours or at next standard dose interval)    Recommended restart time per IR Post-Procedure Guidelines:  Resume Lovenox 0.5mg /kg SQ q24h at regularly scheduled time of 22:00 tonight  Other considerations:      Plan:     As above

## 2017-02-08 NOTE — Progress Notes (Signed)
IP PROGRESS NOTE  Subjective:   Jeffery Wood continues to have chest and arm pain.  He reports difficulty taking a deep breath.  The facial swelling persist, but has improved compared to when he arrived to the emergency room yesterday.  He reports constipation.  Objective: Vital signs in last 24 hours: Blood pressure 138/73, pulse 100, temperature 98.2 F (36.8 C), temperature source Oral, resp. rate 18, height 6' 3"  (1.905 m), weight 256 lb 6.3 oz (116.3 kg), SpO2 97 %.  Intake/Output from previous day: 01/09 0701 - 01/10 0700 In: 240 [P.O.:240] Out: -   Physical Exam:  HEENT: No thrush, mild swelling of the face and neck Lungs: Decreased breath sounds at the right upper chest, no respiratory distress Cardiac: Regular rate and rhythm Abdomen: Nontender, no hepatosplenomegaly Extremities: Trace edema at the right arm and hand, no left arm or leg edema  Lab Results: Recent Labs    02/07/17 0910 02/07/17 1511  WBC 9.3 12.3*  HGB 9.8* 12.3*  HCT 30.7* 39.0  PLT 230 330    BMET Recent Labs    02/07/17 0910 02/07/17 1511  NA 137  --   K 3.9  --   CL 102  --   CO2 27  --   GLUCOSE 94  --   BUN 15  --   CREATININE 0.69 0.71  CALCIUM 8.8*  --     No results found for: CEA1  Studies/Results: Ir Picc Placement Right >5 Yrs Inc Img Guide  Result Date: 02/08/2017 INDICATION: History of lymphoma. In need of intravenous access for the initiation of chemotherapy. EXAM: ULTRASOUND AND FLUOROSCOPIC GUIDED PICC LINE INSERTION MEDICATIONS: None. CONTRAST:  None FLUOROSCOPY TIME:  12 seconds (13 mGy) COMPLICATIONS: None immediate. TECHNIQUE: The procedure, risks, benefits, and alternatives were explained to the patient and informed written consent was obtained. A timeout was performed prior to the initiation of the procedure. The right upper extremity was prepped with chlorhexidine in a sterile fashion, and a sterile drape was applied covering the operative field. Maximum barrier  sterile technique with sterile gowns and gloves were used for the procedure. A timeout was performed prior to the initiation of the procedure. Local anesthesia was provided with 1% lidocaine. Under direct ultrasound guidance, the brachial vein was accessed with a micropuncture kit after the overlying soft tissues were anesthetized with 1% lidocaine. After the overlying soft tissues were anesthetized, a small venotomy incision was created and a micropuncture kit was utilized to access the right brachial vein. Real-time ultrasound guidance was utilized for vascular access including the acquisition of a permanent ultrasound image documenting patency of the accessed vessel. A guidewire was advanced to the level of the superior caval-atrial junction for measurement purposes and the PICC line was cut to length. A peel-away sheath was placed and a 41 cm, 5 [place], dual lumen was inserted to level of the superior caval-atrial junction. A post procedure spot fluoroscopic was obtained. The catheter easily aspirated and flushed and was sutured in place. A dressing was placed. The patient tolerated the procedure well without immediate post procedural complication. FINDINGS: After catheter placement, the tip lies within the superior cavoatrial junction. The catheter aspirates and flushes normally and is ready for immediate use. IMPRESSION: Successful ultrasound and fluoroscopic guided placement of a right brachial vein approach, 41 cm, 5 [place], dual lumen PICC with tip at the superior caval-atrial junction. The PICC line is ready for immediate use. Electronically Signed   By: Eldridge Abrahams.D.  On: 02/08/2017 09:19    Medications: I have reviewed the patient's current medications.  Assessment/Plan: 1. High-grade B-cell non-Hodgkin's lymphoma, molecular studies pending, clinical stage IV, IPI- low intermediate risk  Chest x-ray 01/15/2017 - medial right upper lobe/perihilar mass.    Chest CT 01/19/2017 - mass  constricting the superior vena cava, subcarinal adenopathy, right suprahilar/mediastinal mass.    01/24/2017 status post bronchoscopy with transbronchial biopsy of level 7 node and biopsy of a right upper lobe lung mass by Dr. Servando Snare.  There was compression of the right mainstem bronchus.  A mass was noted in the right upper lobe.  Mediastinal lymph nodes were seen on EBUS.  Multiple biopsies of a level 7 node were obtained.  Brushings and a biopsy of the right upper lobe mass were obtained.  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  Final pathology was nondiagnostic.    PET scan on 02/02/2017 showed hypermetabolism corresponding to the right sided mediastinal mass, progressive since the CT 01/19/2017.  Metastatic disease/lymphoma within the bones, pericardium, stomach, bowel and abdominopelvic nodal stations.    Brain MRI 02/03/2017 showed no evidence of intracranial metastases or acute abnormality.    02/05/2017 status post mediastinoscopy.    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  Molecular studies are pending. 2. Chest/upper back pain likely secondary to #1 3. Exertional dyspnea secondary to #1 and COPD 4. Asthma 5. Diabetes mellitus 6. Gout 7. Remote history of "carcinoid syndrome" 8. Remote history of tobacco use 9. Basal cell carcinoma removed from the right face 10. 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  The plan is to begin R-CHOP chemotherapy today.  He will undergo PICC placement prior to beginning systemic therapy.  He is scheduled for an echocardiogram today.   He continued intravenous hydration and allopurinol prophylaxis.  We will continue monitoring the blood glucose while on steroids. I reviewed the R-CHOP chemotherapy orders with the hospital pharmacist.       LOS: 1 day   Betsy Coder, MD    02/08/2017, 11:14 AM

## 2017-02-09 DIAGNOSIS — K5903 Drug induced constipation: Secondary | ICD-10-CM

## 2017-02-09 LAB — GLUCOSE, CAPILLARY
GLUCOSE-CAPILLARY: 116 mg/dL — AB (ref 65–99)
GLUCOSE-CAPILLARY: 169 mg/dL — AB (ref 65–99)
Glucose-Capillary: 148 mg/dL — ABNORMAL HIGH (ref 65–99)
Glucose-Capillary: 169 mg/dL — ABNORMAL HIGH (ref 65–99)

## 2017-02-09 MED ORDER — PREDNISONE 50 MG PO TABS
100.0000 mg | ORAL_TABLET | Freq: Every day | ORAL | Status: DC
  Administered 2017-02-10: 100 mg via ORAL
  Filled 2017-02-09: qty 2

## 2017-02-09 MED ORDER — FLEET ENEMA 7-19 GM/118ML RE ENEM: 1.0000 | ENEMA | Freq: Every day | RECTAL | Status: DC | PRN

## 2017-02-09 NOTE — Progress Notes (Signed)
Vincristine dose and dilution verified with Laural Benes, RN.

## 2017-02-09 NOTE — Progress Notes (Signed)
IP PROGRESS NOTE  Subjective:   [name] completed treatment with rituximab yesterday evening.  He developed upper chest discomfort during the rituximab infusion.  The rituximab was not escalated to the full infusion rate. He continues to have pain at the chest and right arm.  Swelling of the neck and right arm persist.  He has exertional dyspnea.  He remains constipated.  No nausea or abdominal pain.  Objective: Vital signs in last 24 hours: Blood pressure 137/82, pulse 86, temperature 98 F (36.7 C), temperature source Oral, resp. rate 18, height _0  (1.905 m), weight 256 lb 6.3 oz (116.3 kg), SpO2 99 %.  Intake/Output from previous day: 01/10 0701 - 01/11 0700 In: 755 [P.O.:755] Out: 150 [Urine:150]  Physical Exam:  HEENT: No thrush, mild swelling of the face and neck Lungs: Decreased breath sounds at the right upper chest, no respiratory distress Cardiac: Regular rate and rhythm Abdomen: Nontender, no hepatosplenomegaly Extremities: Trace edema at the right arm and hand, no left arm or leg edema   Right PICC site without erythema  Lab Results: Recent Labs    02/07/17 0910 02/07/17 1511  WBC 9.3 12.3*  HGB 9.8* 12.3*  HCT 30.7* 39.0  PLT 230 330   02/07/2017-uric acid 6.7, LDH 138 BMET Recent Labs    02/07/17 0910 02/07/17 1511  NA 137  --   K 3.9  --   CL 102  --   CO2 27  --   GLUCOSE 94  --   BUN 15  --   CREATININE 0.69 0.71  CALCIUM 8.8*  --     Studies/Results: Ir Picc Placement Right >5 Yrs Inc Img Guide  Result Date: 02/08/2017 INDICATION: History of lymphoma. In need of intravenous access for the initiation of chemotherapy. EXAM: ULTRASOUND AND FLUOROSCOPIC GUIDED PICC LINE INSERTION MEDICATIONS: None. CONTRAST:  None FLUOROSCOPY TIME:  12 seconds (13 mGy) COMPLICATIONS: None immediate. TECHNIQUE: The procedure, risks, benefits, and alternatives were explained to the patient and informed written consent was obtained. A timeout was performed  prior to the initiation of the procedure. The right upper extremity was prepped with chlorhexidine in a sterile fashion, and a sterile drape was applied covering the operative field. Maximum barrier sterile technique with sterile gowns and gloves were used for the procedure. A timeout was performed prior to the initiation of the procedure. Local anesthesia was provided with 1% lidocaine. Under direct ultrasound guidance, the brachial vein was accessed with a micropuncture kit after the overlying soft tissues were anesthetized with 1% lidocaine. After the overlying soft tissues were anesthetized, a small venotomy incision was created and a micropuncture kit was utilized to access the right brachial vein. Real-time ultrasound guidance was utilized for vascular access including the acquisition of a permanent ultrasound image documenting patency of the accessed vessel. A guidewire was advanced to the level of the superior caval-atrial junction for measurement purposes and the PICC line was cut to length. A peel-away sheath was placed and a 41 cm, 5 [place], dual lumen was inserted to level of the superior caval-atrial junction. A post procedure spot fluoroscopic was obtained. The catheter easily aspirated and flushed and was sutured in place. A dressing was placed. The patient tolerated the procedure well without immediate post procedural complication. FINDINGS: After catheter placement, the tip lies within the superior cavoatrial junction. The catheter aspirates and flushes normally and is ready for immediate use. IMPRESSION: Successful ultrasound and fluoroscopic guided placement of a right brachial vein approach, 41 cm, 5  [place], dual lumen PICC with tip at the superior caval-atrial junction. The PICC line is ready for immediate use. Electronically Signed   By: Jeffery Wood M.D.   On: 02/08/2017 09:19    Medications: I have reviewed the patient's current medications.  Assessment/Plan: 1. High-grade B-cell  non-Hodgkin's lymphoma, molecular studies pending, clinical stage IV, IPI- low intermediate risk  Chest x-ray 01/15/2017 - medial right upper lobe/perihilar mass.    Chest CT 01/19/2017 - mass constricting the superior vena cava, subcarinal adenopathy, right suprahilar/mediastinal mass.    01/24/2017 status post bronchoscopy with transbronchial biopsy of level 7 node and biopsy of a right upper lobe lung mass by Dr. Servando Snare.  There was compression of the right mainstem bronchus.  A mass was noted in the right upper lobe.  Mediastinal lymph nodes were seen on EBUS.  Multiple biopsies of a level 7 node were obtained.  Brushings and a biopsy of the right upper lobe mass were obtained.  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  Final pathology was nondiagnostic.    PET scan on 02/02/2017 showed hypermetabolism corresponding to the right sided mediastinal mass, progressive since the CT 01/19/2017.  Metastatic disease/lymphoma within the bones, pericardium, stomach, bowel and abdominopelvic nodal stations.    Brain MRI 02/03/2017 showed no evidence of intracranial metastases or acute abnormality.    02/05/2017 status post mediastinoscopy.    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  Molecular studies are pending.  Rituximab 02/08/2017  Cycle 1 CHOP 02/09/2017 2. Chest/upper back and right arm painsecondary to #1 3. Exertional dyspnea secondary to #1 and COPD 4. Asthma 5. Diabetes mellitus 6. Gout 7. Remote history of "carcinoid syndrome" 8. Remote history of tobacco use 9. Basal cell carcinoma removed from the right face 10. 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  Constipation secondary to narcotic analgesics and potentially lymphoma involving the GI tract   [name] appears unchanged.  He completed treatment with rituximab yesterday.  The plan is to  proceed with CHOP chemotherapy today.  He continues intravenous hydration and allopurinol.  We will check a chemistry panel 02/10/2017.  We have reviewed potential toxicities associated with chemotherapy and he agrees to proceed.  The echocardiogram from 02/07/2017 confirmed a normal left ventricular ejection fraction.  He understands the chance of bowel perforation with treated lymphoma of the GI tract.  The constipation is most likely related to immobility and narcotics, though lymphoma involving the bowel may be contributing.  He will try sorbitol today.  He will begin daily prednisone tomorrow.       LOS: 2 days   Betsy Coder, MD   02/09/2017, 9:21 AM

## 2017-02-09 NOTE — Progress Notes (Signed)
Chemo dosages and dilutions verified by 2 RNs.  

## 2017-02-10 DIAGNOSIS — G893 Neoplasm related pain (acute) (chronic): Secondary | ICD-10-CM

## 2017-02-10 DIAGNOSIS — E119 Type 2 diabetes mellitus without complications: Secondary | ICD-10-CM

## 2017-02-10 LAB — GLUCOSE, CAPILLARY: Glucose-Capillary: 103 mg/dL — ABNORMAL HIGH (ref 65–99)

## 2017-02-10 LAB — COMPREHENSIVE METABOLIC PANEL
ALBUMIN: 2.9 g/dL — AB (ref 3.5–5.0)
ALT: 27 U/L (ref 17–63)
AST: 34 U/L (ref 15–41)
Alkaline Phosphatase: 53 U/L (ref 38–126)
Anion gap: 7 (ref 5–15)
BUN: 15 mg/dL (ref 6–20)
CHLORIDE: 101 mmol/L (ref 101–111)
CO2: 26 mmol/L (ref 22–32)
Calcium: 8.9 mg/dL (ref 8.9–10.3)
Creatinine, Ser: 0.55 mg/dL — ABNORMAL LOW (ref 0.61–1.24)
GFR calc Af Amer: >60 mL/min (ref >60)
GFR calc non Af Amer: >60 mL/min (ref >60)
GLUCOSE: 113 mg/dL — AB (ref 65–99)
POTASSIUM: 4.1 mmol/L (ref 3.5–5.1)
Sodium: 134 mmol/L — ABNORMAL LOW (ref 135–145)
Total Bilirubin: 0.5 mg/dL (ref 0.3–1.2)
Total Protein: 5.9 g/dL — ABNORMAL LOW (ref 6.5–8.1)

## 2017-02-10 LAB — PHOSPHORUS: Phosphorus: 3.4 mg/dL (ref 2.5–4.6)

## 2017-02-10 MED ORDER — PREDNISONE 50 MG PO TABS: ORAL_TABLET | ORAL | 0 refills | Status: DC

## 2017-02-10 MED ORDER — PROCHLORPERAZINE MALEATE 10 MG PO TABS: 10.0000 mg | ORAL_TABLET | Freq: Four times a day (QID) | ORAL | 0 refills | Status: DC | PRN

## 2017-02-10 MED ORDER — ALLOPURINOL 300 MG PO TABS
300.0000 mg | ORAL_TABLET | Freq: Every day | ORAL | 0 refills | Status: DC
Start: 2017-02-10 — End: 2017-03-01

## 2017-02-10 NOTE — Discharge Summary (Signed)
Physician Discharge Summary  Patient ID: Jeffery Wood @ATTENDINGNPI @ MRN: [id number] DOB/AGE: 01-Aug-1955 62 y.o.  Admit date: 02/07/2017 Discharge date: 02/10/2017  Discharge Diagnoses:  Active Problems: 1.  High-grade B-cell lymphoma 2.  Arm/chest pain secondary to #1 3.  SVC syndrome 4.  Diabetes   Discharged Condition: Improved  Discharge Labs: Potassium 4.1, creatinine 0.55, phosphorus 3.4  Significant Diagnostic Studies: None  Consults: None  Procedures: Infusional chemotherapy  Disposition: 01-Home or Self Care   Allergies as of 02/10/2017   No Known Allergies     Medication List    TAKE these medications   acetaminophen 650 MG CR tablet Commonly known as:  TYLENOL Take 1,300 mg by mouth 2 (two) times daily as needed for pain.   allopurinol 300 MG tablet Commonly known as:  ZYLOPRIM Take 1 tablet (300 mg total) by mouth daily.   aspirin EC 81 MG tablet Take 81 mg by mouth daily.   betamethasone dipropionate 0.05 % cream Commonly known as:  DIPROLENE Apply 1 application topically daily.   celecoxib 200 MG capsule Commonly known as:  CELEBREX Take 200 mg by mouth 2 (two) times daily.   clonazePAM 0.5 MG tablet Commonly known as:  KLONOPIN Take 0.5 mg by mouth at bedtime.   etodolac 400 MG tablet Commonly known as:  LODINE Take 400 mg by mouth 3 (three) times daily as needed (gout pain).   Fluticasone-Salmeterol 100-50 MCG/DOSE Aepb Commonly known as:  ADVAIR Inhale 1 puff into the lungs daily.   glimepiride 4 MG tablet Commonly known as:  AMARYL Take 2 mg by mouth 2 (two) times daily before a meal.   levalbuterol 45 MCG/ACT inhaler Commonly known as:  XOPENEX HFA Inhale 2 puffs into the lungs daily as needed for wheezing or shortness of breath.   loratadine 10 MG tablet Commonly known as:  CLARITIN Take 10 mg by mouth daily as needed for allergies.   losartan 100 MG tablet Commonly known as:  COZAAR Take 100 mg by mouth daily.    metFORMIN 1000 MG tablet Commonly known as:  GLUCOPHAGE Take 1,000 mg by mouth 2 (two) times daily with a meal.   multivitamins ther. w/minerals Tabs tablet Take 1 tablet by mouth daily.   omeprazole 20 MG capsule Commonly known as:  PRILOSEC Take 20 mg by mouth 2 (two) times daily.   OVER THE COUNTER MEDICATION Apply 1 application topically 3 (three) times daily as needed (Inflamed Psorasis). Topical Cream, doesn't recall the name.   oxyCODONE 5 MG immediate release tablet Commonly known as:  Oxy IR/ROXICODONE Take 1 tablet (5 mg total) by mouth every 4 (four) hours as needed for severe pain.   oxycodone 5 MG capsule Commonly known as:  OXY-IR Take 1 capsule (5 mg total) by mouth every 4 (four) hours as needed.   polyethylene glycol packet Commonly known as:  MIRALAX Take 17 g by mouth daily.   predniSONE 50 MG tablet Commonly known as:  DELTASONE Take 100 mg daily for 3 days starting 02/11/2017   prochlorperazine 10 MG tablet Commonly known as:  COMPAZINE Take 1 tablet (10 mg total) by mouth every 6 (six) hours as needed for nausea or vomiting.   senna 8.6 MG Tabs tablet Commonly known as:  SENOKOT Take 1 tablet (8.6 mg total) by mouth 2 (two) times daily.   tiotropium 18 MCG inhalation capsule Commonly known as:  SPIRIVA Place 18 mcg into inhaler and inhale daily.   traMADol 50 MG tablet Commonly known  as:  ULTRAM Take 50 mg by mouth at bedtime as needed for moderate pain.   trolamine salicylate 10 % cream Commonly known as:  ASPERCREME Apply 1 application topically 2 (two) times daily as needed for muscle pain.       Follow-up Information    Ladell Pier, MD Follow up.   Specialty:  Oncology Why:  Office will call you with appointment time for the week of 02/12/2017 Contact information: [place] [address] [phone]           Hospital Course: [name] underwent a diagnostic mediastinoscopy 02/05/2017.  The  pathology from biopsy of a level 7 lymph node confirmed a high-grade B-cell lymphoma. [name] was scheduled for an appointment at the Cancer center on 02/07/2017, but he developed facial swelling, increased chest/right arm pain, and dyspnea.  He presented to the emergency room for further evaluation.  He was felt to have early SVC syndrome related to tumor in the right upper chest obstructing the SVC.  He was admitted for further evaluation.  [name] was treated with Decadron on the day of admission.  He received rituximab on 02/08/2017.  He developed flushing and a "hot "feeling with an increase in the rituximab dose.  He was able to complete the rituximab infusion without escalating the dose beyond 100 mg/h.  He was placed on intravenous hydration and allopurinol prophylaxis.  He completed CHOP chemotherapy on 02/09/2017.  [name] tolerated the chemotherapy without significant acute toxicity.  He had significant improvement in facial swelling, dyspnea, pain, and arm swelling on 02/10/2017.  He appears stable for discharge on the morning of 02/10/2017.  [name] will complete an outpatient course of prednisone.  He will be scheduled for follow-up appointment at the Cancer center during the week of 02/12/2017.  Discharge examination: Lungs: Decreased breath sounds at the right upper ear chest, no respiratory distress  cardiac: Regular rate and rhythm Abdomen: Soft and nontender, no hepatosplenomegaly Vascular: Trace edema of the right arm and hand     Discharge Instructions    SCHEDULING COMMUNICATION   Complete by:  As directed    Chemotherapy Appointment - 6.5 hr   TREATMENT CONDITIONS   Complete by:  As directed    Patient should have CBC & CMP within 7 days prior to chemotherapy administration. NOTIFY MD IF: ANC < 1500, Hemoglobin < 8, PLT < 100,000,  Total Bili > 1.5, Creatinine > 1.5, ALT & AST > 80 or if patient has unstable vital signs: Temperature > 38.5, SBP > 180 or < 90, RR > 30  or HR > 100.      Signed: Betsy Coder, MD 02/10/2017, 7:32 AM

## 2017-02-10 NOTE — Discharge Instructions (Signed)
Call for fever, bleeding, shortness of breath, or blood sugar greater than 400  Avoid concentrated sweets while on prednisone

## 2017-02-10 NOTE — Progress Notes (Signed)
Patient discharged to home, all discharge medications and instructions reviewed and questions answered.  Patient to be assisted to vehicle by wheelchair.  

## 2017-02-12 ENCOUNTER — Ambulatory Visit (HOSPITAL_COMMUNITY)
Admission: RE | Admit: 2017-02-12 | Discharge: 2017-02-12 | Disposition: A | Discharge status: Home / Self Care | Payer: BC Managed Care – PPO | Source: Ambulatory Visit | Attending: Nurse Practitioner | Admitting: Nurse Practitioner

## 2017-02-12 ENCOUNTER — Inpatient Hospital Stay: Payer: BC Managed Care – PPO | Attending: Oncology

## 2017-02-12 ENCOUNTER — Telehealth: Payer: Self-pay | Admitting: Oncology

## 2017-02-12 ENCOUNTER — Telehealth: Payer: Self-pay | Admitting: *Deleted

## 2017-02-12 ENCOUNTER — Inpatient Hospital Stay (HOSPITAL_BASED_OUTPATIENT_CLINIC_OR_DEPARTMENT_OTHER): Payer: BC Managed Care – PPO | Admitting: Nurse Practitioner

## 2017-02-12 ENCOUNTER — Encounter: Payer: Self-pay | Admitting: Nurse Practitioner

## 2017-02-12 VITALS — BP 144/66 | HR 88 | Temp 98.2°F | Resp 16

## 2017-02-12 VITALS — BP 128/65 | HR 95 | Temp 99.3°F | Resp 18 | Ht 75.0 in | Wt 263.4 lb

## 2017-02-12 DIAGNOSIS — Z87891 Personal history of nicotine dependence: Secondary | ICD-10-CM | POA: Insufficient documentation

## 2017-02-12 DIAGNOSIS — C8338 Diffuse large B-cell lymphoma, lymph nodes of multiple sites: Secondary | ICD-10-CM | POA: Diagnosis present

## 2017-02-12 DIAGNOSIS — G893 Neoplasm related pain (acute) (chronic): Secondary | ICD-10-CM

## 2017-02-12 DIAGNOSIS — R0600 Dyspnea, unspecified: Secondary | ICD-10-CM | POA: Diagnosis not present

## 2017-02-12 DIAGNOSIS — C8330 Diffuse large B-cell lymphoma, unspecified site: Secondary | ICD-10-CM | POA: Insufficient documentation

## 2017-02-12 DIAGNOSIS — J9811 Atelectasis: Secondary | ICD-10-CM | POA: Diagnosis not present

## 2017-02-12 DIAGNOSIS — R222 Localized swelling, mass and lump, trunk: Secondary | ICD-10-CM | POA: Diagnosis not present

## 2017-02-12 DIAGNOSIS — K5903 Drug induced constipation: Secondary | ICD-10-CM | POA: Insufficient documentation

## 2017-02-12 DIAGNOSIS — R0602 Shortness of breath: Secondary | ICD-10-CM | POA: Diagnosis not present

## 2017-02-12 DIAGNOSIS — J90 Pleural effusion, not elsewhere classified: Secondary | ICD-10-CM | POA: Diagnosis not present

## 2017-02-12 DIAGNOSIS — R918 Other nonspecific abnormal finding of lung field: Secondary | ICD-10-CM | POA: Insufficient documentation

## 2017-02-12 MED ORDER — PEGFILGRASTIM INJECTION 6 MG/0.6ML ~~LOC~~
6.0000 mg | PREFILLED_SYRINGE | Freq: Once | SUBCUTANEOUS | Status: AC
  Administered 2017-02-12: 6 mg via SUBCUTANEOUS

## 2017-02-12 NOTE — Patient Instructions (Signed)
Pegfilgrastim injection What is this medicine? PEGFILGRASTIM (PEG fil gra stim) is a long-acting granulocyte colony-stimulating factor that stimulates the growth of neutrophils, a type of Dambrosia blood cell important in the body's fight against infection. It is used to reduce the incidence of fever and infection in patients with certain types of cancer who are receiving chemotherapy that affects the bone marrow, and to increase survival after being exposed to high doses of radiation. This medicine may be used for other purposes; ask your health care provider or pharmacist if you have questions. COMMON BRAND NAME(S): Neulasta What should I tell my health care provider before I take this medicine? They need to know if you have any of these conditions: -kidney disease -latex allergy -ongoing radiation therapy -sickle cell disease -skin reactions to acrylic adhesives (On-Body Injector only) -an unusual or allergic reaction to pegfilgrastim, filgrastim, other medicines, foods, dyes, or preservatives -pregnant or trying to get pregnant -breast-feeding How should I use this medicine? This medicine is for injection under the skin. If you get this medicine at home, you will be taught how to prepare and give the pre-filled syringe or how to use the On-body Injector. Refer to the patient Instructions for Use for detailed instructions. Use exactly as directed. Tell your healthcare provider immediately if you suspect that the On-body Injector may not have performed as intended or if you suspect the use of the On-body Injector resulted in a missed or partial dose. It is important that you put your used needles and syringes in a special sharps container. Do not put them in a trash can. If you do not have a sharps container, call your pharmacist or healthcare provider to get one. Talk to your pediatrician regarding the use of this medicine in children. While this drug may be prescribed for selected conditions,  precautions do apply. Overdosage: If you think you have taken too much of this medicine contact a poison control center or emergency room at once. NOTE: This medicine is only for you. Do not share this medicine with others. What if I miss a dose? It is important not to miss your dose. Call your doctor or health care professional if you miss your dose. If you miss a dose due to an On-body Injector failure or leakage, a new dose should be administered as soon as possible using a single prefilled syringe for manual use. What may interact with this medicine? Interactions have not been studied. Give your health care provider a list of all the medicines, herbs, non-prescription drugs, or dietary supplements you use. Also tell them if you smoke, drink alcohol, or use illegal drugs. Some items may interact with your medicine. This list may not describe all possible interactions. Give your health care provider a list of all the medicines, herbs, non-prescription drugs, or dietary supplements you use. Also tell them if you smoke, drink alcohol, or use illegal drugs. Some items may interact with your medicine. What should I watch for while using this medicine? You may need blood work done while you are taking this medicine. If you are going to need a MRI, CT scan, or other procedure, tell your doctor that you are using this medicine (On-Body Injector only). What side effects may I notice from receiving this medicine? Side effects that you should report to your doctor or health care professional as soon as possible: -allergic reactions like skin rash, itching or hives, swelling of the face, lips, or tongue -dizziness -fever -pain, redness, or irritation at site   where injected -pinpoint red spots on the skin -red or dark-brown urine -shortness of breath or breathing problems -stomach or side pain, or pain at the shoulder -swelling -tiredness -trouble passing urine or change in the amount of urine Side  effects that usually do not require medical attention (report to your doctor or health care professional if they continue or are bothersome): -bone pain -muscle pain This list may not describe all possible side effects. Call your doctor for medical advice about side effects. You may report side effects to FDA at 1-800-FDA-1088. Where should I keep my medicine? Keep out of the reach of children. Store pre-filled syringes in a refrigerator between 2 and 8 degrees C (36 and 46 degrees F). Do not freeze. Keep in carton to protect from light. Throw away this medicine if it is left out of the refrigerator for more than 48 hours. Throw away any unused medicine after the expiration date. NOTE: This sheet is a summary. It may not cover all possible information. If you have questions about this medicine, talk to your doctor, pharmacist, or health care provider.  2018 Elsevier/Gold Standard (2016-01-13 12:58:03)  

## 2017-02-12 NOTE — Telephone Encounter (Signed)
Scheduled appt per 1/14 sch message - Pt is aware of appt date and time.

## 2017-02-12 NOTE — Progress Notes (Signed)
O2 sat 94 % while ambulating

## 2017-02-12 NOTE — Telephone Encounter (Signed)
Called pt with appt for Neulasta toady. He reports not being able to rest and feeling uneasy/ dyspneic. He reports the dyspnea improves somewhat after taking Oxycodone and he is able to rest. He denies pain. Reviewed with Dr. Benay Spice: We can see pt today in symptom management or he can try half tab of Klonopin during the day to see if it helps. Left message for pt with above information.

## 2017-02-12 NOTE — Progress Notes (Addendum)
[facility] OFFICE PROGRESS NOTE   Diagnosis: Non-Hodgkin's lymphoma  INTERVAL HISTORY:   Jeffery Wood is seen in an unscheduled visit for evaluation of shortness of breath.  He completed cycle 1 rituximab 02/08/2017, cycle 1 CHOP on 11/2017.  He was discharged home 02/10/2017.  He reports significant dyspnea prior to the hospitalization.  This improved during the hospitalization.  Since he was discharged home he notes worsening shortness of breath with minimal activity and when he lays flat.  He denies chest pain.  No significant cough.  He intermittently notes wheezing.  No fever.  He noted left leg swelling this morning.  Objective:  Vital signs in last 24 hours:  Blood pressure 128/65, pulse 95, temperature 99.3 F (37.4 C), temperature source Oral, resp. rate 18, height 6\' 3"  (1.905 m), weight 263 lb 6.4 oz (119.5 kg), SpO2 95 %.    HEENT: No thrush or ulcers.  Facial/neck edema markedly improved. Lymphatics: No palpable cervical or supraclavicular lymph nodes. Resp: Breath sounds diminished right upper anterior chest.  No respiratory distress. Cardio: Regular rate and rhythm.  Question mild neck vein distention when supine. GI: Abdomen soft and nontender. Vascular: Trace bilateral pretibial/ankle edema.  Right arm edema improved. Neuro: Alert and oriented. Skin: Mild erythema over the upper chest.   Lab Results:  Lab Results  Component Value Date   WBC 12.3 (H) 02/07/2017   HGB 12.3 (L) 02/07/2017   HCT 39.0 02/07/2017   MCV 91.5 02/07/2017   PLT 330 02/07/2017   NEUTROABS 7.0 02/07/2017    Imaging:  No results found.  Medications: I have reviewed the patient's current medications.  Assessment/Plan: 1. High-grade B-cell non-Hodgkin's lymphoma, molecular studies pending, clinical stage IV, IPI- low intermediate risk  Chest x-ray 01/15/2017-medial right upper lobe/perihilar mass.   Chest CT 01/19/2017-mass constricting the superior vena cava,  subcarinal adenopathy, right suprahilar/mediastinal mass.   12/26/2018status postbronchoscopy with transbronchial biopsy of level 7node and biopsy of aright upper lobe lung mass by Dr. Servando Snare. There was compression of the right mainstem bronchus. A mass was noted in the right upper lobe. Mediastinal lymph nodes were seen on EBUS. Multiple biopsies of a level 7 node were obtained. Brushings and a biopsy of the right upper lobe mass were obtained. 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 Final pathology was nondiagnostic.   PET scan on 02/02/2017 showed hypermetabolism corresponding to the right sided mediastinal mass,progressive since the CT 01/19/2017. Metastatic disease/lymphoma within the bones, pericardium, stomach, bowel and abdominopelvic nodal stations.   Brain MRI 02/03/2017 showed no evidence of intracranial metastases or acute abnormality.   1/7/2019status postmediastinoscopy.  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 Molecular studies are pending.  Rituximab 02/08/2017  Cycle 1 CHOP 02/09/2017 2. Chest/upper back and right arm pain secondary to #1 3. Exertional dyspnea secondary to #1 and COPD 4. Asthma 5. Diabetes mellitus 6. Gout 7. Remote history of "carcinoid syndrome" 8. Remote history of tobacco use 9. Basal cell carcinoma removed from the right face 10. Chronic low back pain status post epidural steroid injections-followed at a pain management clinic 11. 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 Constipation secondary to narcotic analgesics and potentially lymphoma involving the GI tract-improved.     Disposition: Jeffery Wood has completed 1 cycle of CHOP/Rituxan with clinical improvement with regard to the SVC syndrome, pain.  He presents today with orthopnea/exertional dyspnea of unclear etiology.  Chest x-ray appears stable with no clear explanation for the dyspnea.  Plan for continued observation.  Hopefully the  dyspnea will improve over the next few days.  He understands to contact the office if the dyspnea worsens or he develops new symptoms.  He will return for follow-up as scheduled 02/16/2017.  Patient seen with Dr. Benay Spice.  25 minutes were spent face-to-face at today's visit with the majority of that time involved in counseling/coordination of care.    Ned Card ANP/GNP-BC   02/12/2017  2:17 PM  This was a shared visit with Ned Card.  Jeffery Wood was interviewed and examined.  We reviewed the chest x-ray images.  He completed cycle 1 CHOP on 02/09/2017.  He tolerated the chemotherapy without acute toxicity and his overall status is much improved.  The facial/neck edema has resolved and he no longer has arm pain.  He continues to have orthopnea and exertional dyspnea, potentially related to the right upper lung/mediastinal mass.  An echocardiogram revealed a normal LVEF and no pericardial effusion.  We have a low clinical suspicion for a pulmonary embolism.  He will return for an office visit on 02/16/2017.  He will contact us in the interim for increased dyspnea or new symptoms.  Julieanne Manson, MD

## 2017-02-14 ENCOUNTER — Telehealth: Payer: Self-pay | Admitting: *Deleted

## 2017-02-14 NOTE — Telephone Encounter (Signed)
Left message at home # for pt to call office with an update.

## 2017-02-15 ENCOUNTER — Other Ambulatory Visit: Payer: Self-pay

## 2017-02-15 ENCOUNTER — Telehealth: Payer: Self-pay

## 2017-02-15 DIAGNOSIS — C8330 Diffuse large B-cell lymphoma, unspecified site: Secondary | ICD-10-CM

## 2017-02-15 NOTE — Telephone Encounter (Signed)
Spoke with patient to receive an update on his status. Patient states that he is still SOB, "I just can't get a breath". Breathing was "worst last night when I went to bed, but now its better". Pt also reports "no energy". States that his swelling has decreased but pt still notes slight swelling in left leg and ankle. R shoulder hurting. Not a new pain, pt describes a "dull, annoying pain". Will make Dr. Benay Spice aware.   Per MD, patient to keep scheduled appointment for tomorrow. Instructed pt to make sure he props up on pillows at night so hes not laying flat. Pt voiced understanding. Knows to call with any issues or concerns.

## 2017-02-16 ENCOUNTER — Ambulatory Visit (HOSPITAL_COMMUNITY)
Admission: RE | Admit: 2017-02-16 | Discharge: 2017-02-16 | Disposition: A | Discharge status: Home / Self Care | Payer: BC Managed Care – PPO | Source: Ambulatory Visit | Attending: Cardiology | Admitting: Cardiology

## 2017-02-16 ENCOUNTER — Encounter: Payer: Self-pay | Admitting: Oncology

## 2017-02-16 ENCOUNTER — Inpatient Hospital Stay (HOSPITAL_BASED_OUTPATIENT_CLINIC_OR_DEPARTMENT_OTHER): Payer: BC Managed Care – PPO | Admitting: Oncology

## 2017-02-16 ENCOUNTER — Telehealth: Payer: Self-pay

## 2017-02-16 ENCOUNTER — Inpatient Hospital Stay: Payer: BC Managed Care – PPO

## 2017-02-16 VITALS — BP 151/76 | HR 95 | Temp 98.1°F | Resp 18 | Ht 75.0 in | Wt 252.6 lb

## 2017-02-16 DIAGNOSIS — G893 Neoplasm related pain (acute) (chronic): Secondary | ICD-10-CM

## 2017-02-16 DIAGNOSIS — C8330 Diffuse large B-cell lymphoma, unspecified site: Secondary | ICD-10-CM | POA: Diagnosis not present

## 2017-02-16 DIAGNOSIS — R222 Localized swelling, mass and lump, trunk: Secondary | ICD-10-CM

## 2017-02-16 DIAGNOSIS — R0602 Shortness of breath: Secondary | ICD-10-CM | POA: Diagnosis not present

## 2017-02-16 DIAGNOSIS — C8338 Diffuse large B-cell lymphoma, lymph nodes of multiple sites: Secondary | ICD-10-CM | POA: Diagnosis not present

## 2017-02-16 DIAGNOSIS — Z87891 Personal history of nicotine dependence: Secondary | ICD-10-CM | POA: Diagnosis not present

## 2017-02-16 LAB — CBC WITH DIFFERENTIAL (CANCER CENTER ONLY)
BASOS ABS: 0.1 10*3/uL (ref 0.0–0.1)
Basophils Relative: 0 %
Eosinophils Absolute: 0.2 10*3/uL (ref 0.0–0.5)
Eosinophils Relative: 2 %
HEMATOCRIT: 37.6 % — AB (ref 38.4–49.9)
Hemoglobin: 12.2 g/dL — ABNORMAL LOW (ref 13.0–17.1)
LYMPHS ABS: 1.1 10*3/uL (ref 0.9–3.3)
LYMPHS PCT: 9 %
MCH: 29 pg (ref 27.2–33.4)
MCHC: 32.4 g/dL (ref 32.0–36.0)
MCV: 89.3 fL (ref 79.3–98.0)
MONO ABS: 1.8 10*3/uL — AB (ref 0.1–0.9)
MONOS PCT: 15 %
NEUTROS ABS: 9.1 10*3/uL — AB (ref 1.5–6.5)
Neutrophils Relative %: 74 %
Platelet Count: 271 10*3/uL (ref 140–400)
RBC: 4.21 MIL/uL (ref 4.20–5.82)
RDW: 14.2 % (ref 11.0–15.6)
WBC Count: 12.3 10*3/uL — ABNORMAL HIGH (ref 4.0–10.3)

## 2017-02-16 NOTE — Telephone Encounter (Signed)
Spoke with Felecia at [facility]. Appt available for pt to get doppler done today at their office. Voiced understanding. Pt on the way.

## 2017-02-16 NOTE — Progress Notes (Signed)
[facility] OFFICE PROGRESS NOTE   Diagnosis: Non-Hodgkin's lymphoma  INTERVAL HISTORY:   Jeffery Wood returns as scheduled.  He reports continued improvement in swelling.  He has malaise, right arm/neck discomfort, and dyspnea.  The dyspnea has improved significantly today.  He is able to take a deeper breath and has less orthopnea. He has noted increased swelling at the left foot and ankle.  No pain.  He fatigues easily.  Objective:  Vital signs in last 24 hours:  Blood pressure (!) 151/76, pulse 95, temperature 98.1 F (36.7 C), temperature source Oral, resp. rate 18, height 6\' 3"  (1.905 m), weight 252 lb 9.6 oz (114.6 kg), SpO2 96 %.    HEENT: No thrush or ulcers Resp: Decreased breath sounds at the right upper chest compared to the left side-improved compared to 02/12/2017 Cardio: Regular rate and rhythm, the neck veins are not distended GI: Nontender, no hepatomegaly Vascular: Trace edema at the left foot and ankle, no erythema or tenderness throughout the left leg, no face or neck edema Skin: Small ulcerated lesion at the right side of the chin  Portacath/PICC-without erythema  Lab Results:  Lab Results  Component Value Date   WBC 12.3 (H) 02/16/2017   HGB 12.3 (L) 02/07/2017   HCT 37.6 (L) 02/16/2017   MCV 89.3 02/16/2017   PLT 271 02/16/2017   NEUTROABS 9.1 (H) 02/16/2017    CMP     Component Value Date/Time   NA 134 (L) 02/10/2017 0359   K 4.1 02/10/2017 0359   CL 101 02/10/2017 0359   CO2 26 02/10/2017 0359   GLUCOSE 113 (H) 02/10/2017 0359   BUN 15 02/10/2017 0359   CREATININE 0.55 (L) 02/10/2017 0359   CALCIUM 8.9 02/10/2017 0359   PROT 5.9 (L) 02/10/2017 0359   ALBUMIN 2.9 (L) 02/10/2017 0359   AST 34 02/10/2017 0359   ALT 27 02/10/2017 0359   ALKPHOS 53 02/10/2017 0359   BILITOT 0.5 02/10/2017 0359   GFRNONAA >60 02/10/2017 0359   GFRAA >60 02/10/2017 0359     Medications: I have reviewed the patient's current  medications.   Assessment/Plan: 1. High-grade B-cell non-Hodgkin's lymphoma, molecular studies pending, clinical stage IV, IPI- low intermediate risk  Chest x-ray 01/15/2017-medial right upper lobe/perihilar mass.   Chest CT 01/19/2017-mass constricting the superior vena cava, subcarinal adenopathy, right suprahilar/mediastinal mass.   12/26/2018status postbronchoscopy with transbronchial biopsy of level 7node and biopsy of aright upper lobe lung mass by Dr. Servando Snare. There was compression of the right mainstem bronchus. A mass was noted in the right upper lobe. Mediastinal lymph nodes were seen on EBUS. Multiple biopsies of a level 7 node were obtained. Brushings and a biopsy of the right upper lobe mass were obtained. 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 Final pathology was nondiagnostic.   PET scan on 02/02/2017 showed hypermetabolism corresponding to the right sided mediastinal mass,progressive since the CT 01/19/2017. Metastatic disease/lymphoma within the bones, pericardium, stomach, bowel and abdominopelvic nodal stations.   Brain MRI 02/03/2017 showed no evidence of intracranial metastases or acute abnormality.   1/7/2019status postmediastinoscopy.  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 Molecular studies are pending.  Rituximab 02/08/2017  Cycle 1 CHOP 02/09/2017 2. Chest/upper backand right armpain secondary to #1 3. Exertional dyspnea secondary to #1 and COPD 4. Asthma 5. Diabetes mellitus 6. Gout 7. Remote history of "carcinoid syndrome" 8. Remote history of tobacco use 9. Basal cell carcinoma removed from the right face 10.  Chronic low back pain status post epidural steroid injections-followed at a pain management clinic 11. 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 Constipation secondary to narcotic analgesics and potentially lymphoma involving the GI tract-improved.   Disposition: [name] is now at day 8 following cycle 1 CHOP/rituximab.  There is been marked improvement in the facial/arm edema and right arm pain.  He continues to have exertional dyspnea and orthopnea, though this is significantly improved today.  I have a low clinical suspicion for a deep vein thrombosis involving the left leg, but he is at high risk for DVT.  He will be referred for a Doppler of the left leg.  Jeffery Wood will be referred for placement of a Port-A-Cath prior to cycle 2 CHOP/rituximab 03/02/2017.  He will return for office visit 03/02/2017.  He will contact us in the interim as needed.  We are waiting on results from molecular testing on the mediastinal lymph node.  25 minutes were spent with the patient today.  The majority of the time was used for counseling and coordination of care.  Betsy Coder, MD  02/16/2017  5:21 PM

## 2017-02-19 ENCOUNTER — Telehealth: Payer: Self-pay

## 2017-02-19 ENCOUNTER — Ambulatory Visit (HOSPITAL_COMMUNITY): Payer: BC Managed Care – PPO

## 2017-02-19 NOTE — Telephone Encounter (Signed)
See note

## 2017-02-19 NOTE — Telephone Encounter (Signed)
Returned pt call regarding green/bloody nasal drainage. Pt is not experiencing cough or any fevers. Per Dr. Benay Spice he does not want to start the patient on any antibiotics right now. Suggested to patient to use a nasal spray. Informed pt to call if symptoms continue to get worse. No further questions at this time.  Cyndia Bent RN

## 2017-02-20 ENCOUNTER — Telehealth: Payer: Self-pay | Admitting: Oncology

## 2017-02-20 ENCOUNTER — Telehealth: Payer: Self-pay | Admitting: *Deleted

## 2017-02-20 NOTE — Telephone Encounter (Signed)
Spoke with Dr. Benay Spice, and md is ok with pt having port inserted by the surgeon.

## 2017-02-20 NOTE — Telephone Encounter (Signed)
Okay, but I recommend he proceed with IR insertion of Port-A-Cath since this is scheduled already

## 2017-02-20 NOTE — Telephone Encounter (Signed)
Left message for patient re 2/1 appointments.

## 2017-02-20 NOTE — Telephone Encounter (Signed)
Spoke with wife Lattie Haw, and was informed that she had spoke with the surgeon Dr. Hassell Done about port insertion for her husband.  Dr. Hassell Done has agreed to insert port on  02/27/17.   Asked Lattie Haw twice about cancelling port insertion with IR - scheduled for 02/28/17.  Lisa responded  " Yes ".   Tiffany in IR informed of wife's request for cancellation appt. Lisa's     Phone    [phone].

## 2017-02-21 ENCOUNTER — Ambulatory Visit: Payer: Self-pay | Admitting: Surgery

## 2017-02-21 ENCOUNTER — Encounter (HOSPITAL_COMMUNITY): Payer: Self-pay

## 2017-02-23 ENCOUNTER — Other Ambulatory Visit: Payer: Self-pay

## 2017-02-23 ENCOUNTER — Encounter (HOSPITAL_COMMUNITY): Payer: Self-pay

## 2017-02-23 ENCOUNTER — Encounter (HOSPITAL_COMMUNITY)
Admission: RE | Admit: 2017-02-23 | Discharge: 2017-02-23 | Disposition: A | Discharge status: Home / Self Care | Payer: BC Managed Care – PPO | Source: Ambulatory Visit | Attending: Surgery | Admitting: Surgery

## 2017-02-23 DIAGNOSIS — Z01812 Encounter for preprocedural laboratory examination: Secondary | ICD-10-CM | POA: Diagnosis not present

## 2017-02-23 HISTORY — DX: Rupture of artery: I77.2

## 2017-02-23 HISTORY — DX: Gout, unspecified: M10.9

## 2017-02-23 HISTORY — DX: Chronic obstructive pulmonary disease, unspecified: J44.9

## 2017-02-23 HISTORY — DX: Dyspnea, unspecified: R06.00

## 2017-02-23 HISTORY — DX: Other nonspecific abnormal finding of lung field: R91.8

## 2017-02-23 HISTORY — DX: Basal cell carcinoma of skin of other parts of face: C44.319

## 2017-02-23 HISTORY — DX: Personal history of other diseases of the digestive system: Z87.19

## 2017-02-23 LAB — GLUCOSE, CAPILLARY: GLUCOSE-CAPILLARY: 114 mg/dL — AB (ref 65–99)

## 2017-02-23 NOTE — Pre-Procedure Instructions (Signed)
Dr. Jillyn Hidden reviewed CMP results 02/10/17 and CBC with diff results 02/16/17 he does not want labs repeated today at pre op appointment.

## 2017-02-23 NOTE — Patient Instructions (Addendum)
Your procedure is scheduled on: Tuesday, Jan. 29, 2019   Surgery Time:  10:00AM-11:00AM   Report to [facility]  Entrance    Report to admitting at 8:00 AM   Call this number if you have problems the morning of surgery [phone]   Do not eat food or drink liquids :After Midnight.   Do NOT smoke after Midnight   Complete one Ensure drink the morning of surgery 3 hours prior to scheduled surgery (by 7AM)   Take these medicines the morning of surgery with A SIP OF WATER: Omeprazole   Do NOT take Metformin the night before surgery   DO NOT TAKE ANY DIABETIC MEDICATIONS DAY OF YOUR SURGERY   Bring Asthma Inhaler the day of surgery                               You may not have any metal on your body including  jewelry, and body piercings             Do not wear lotions, powders, perfumes/cologne, or deodorant                           Men may shave face and neck.   Do not bring valuables to the hospital. [place].   Contacts, dentures or bridgework may not be worn into surgery.    Patients discharged the day of surgery will not be allowed to drive home.   Name and phone number of your driver:   Special Instructions: Bring a copy of your healthcare power of attorney and living will documents         the day of surgery if you haven't scanned them in before.              Please read over the following fact sheets you were given:   [facility] - Preparing for Surgery Before surgery, you can play an important role.  Because skin is not sterile, your skin needs to be as free of germs as possible.  You can reduce the number of germs on your skin by washing with CHG (chlorahexidine gluconate) soap before surgery.  CHG is an antiseptic cleaner which kills germs and bonds with the skin to continue killing germs even after washing. Please DO NOT use if you have an allergy to CHG or antibacterial soaps.  If your skin  becomes reddened/irritated stop using the CHG and inform your nurse when you arrive at Short Stay. Do not shave (including legs and underarms) for at least 48 hours prior to the first CHG shower.  You may shave your face/neck.  Please follow these instructions carefully:  1.  Shower with CHG Soap the night before surgery and the  morning of surgery.  2.  If you choose to wash your hair, wash your hair first as usual with your normal  shampoo.  3.  After you shampoo, rinse your hair and body thoroughly to remove the shampoo.                             4.  Use CHG as you would any other liquid soap.  You can apply chg directly to the skin and wash.  Gently  with a scrungie or clean washcloth.  5.  Apply the CHG Soap to your body ONLY FROM THE NECK DOWN.   Do   not use on face/ open                           Wound or open sores. Avoid contact with eyes, ears mouth and   genitals (private parts).                       Wash face,  Genitals (private parts) with your normal soap.             6.  Wash thoroughly, paying special attention to the area where your    surgery  will be performed.  7.  Thoroughly rinse your body with warm water from the neck down.  8.  DO NOT shower/wash with your normal soap after using and rinsing off the CHG Soap.                9.  Pat yourself dry with a clean towel.            10.  Wear clean pajamas.            11.  Place clean sheets on your bed the night of your first shower and do not  sleep with pets. Day of Surgery : Do not apply any lotions/deodorants the morning of surgery.  Please wear clean clothes to the hospital/surgery center.  FAILURE TO FOLLOW THESE INSTRUCTIONS MAY RESULT IN THE CANCELLATION OF YOUR SURGERY  PATIENT SIGNATURE_________________________________  NURSE SIGNATURE__________________________________  ________________________________________________________________________

## 2017-02-25 ENCOUNTER — Other Ambulatory Visit: Payer: Self-pay | Admitting: Oncology

## 2017-02-26 ENCOUNTER — Telehealth: Payer: Self-pay

## 2017-02-26 LAB — TISSUE HYBRIDIZATION TO NCBH

## 2017-02-26 NOTE — Telephone Encounter (Signed)
Spoke with pt wife. Patient inquiring about the results of his latest biopsy. Wife states "it would probably decrease his anxiety to know before Friday". This RN voiced understanding. Will make MD aware.

## 2017-02-27 ENCOUNTER — Ambulatory Visit (HOSPITAL_COMMUNITY): Payer: BC Managed Care – PPO | Admitting: Anesthesiology

## 2017-02-27 ENCOUNTER — Ambulatory Visit (HOSPITAL_COMMUNITY): Payer: BC Managed Care – PPO

## 2017-02-27 ENCOUNTER — Encounter (HOSPITAL_COMMUNITY)
Admission: RE | Disposition: A | Discharge status: Home / Self Care | Payer: Self-pay | Source: Ambulatory Visit | Attending: Surgery

## 2017-02-27 ENCOUNTER — Encounter (HOSPITAL_COMMUNITY): Payer: Self-pay | Admitting: *Deleted

## 2017-02-27 ENCOUNTER — Ambulatory Visit (HOSPITAL_COMMUNITY)
Admission: RE | Admit: 2017-02-27 | Discharge: 2017-02-27 | Disposition: A | Discharge status: Home / Self Care | Payer: BC Managed Care – PPO | Source: Ambulatory Visit | Attending: Surgery | Admitting: Surgery

## 2017-02-27 ENCOUNTER — Ambulatory Visit: Payer: Self-pay | Admitting: Surgery

## 2017-02-27 DIAGNOSIS — Z87891 Personal history of nicotine dependence: Secondary | ICD-10-CM | POA: Insufficient documentation

## 2017-02-27 DIAGNOSIS — E119 Type 2 diabetes mellitus without complications: Secondary | ICD-10-CM | POA: Diagnosis not present

## 2017-02-27 DIAGNOSIS — J449 Chronic obstructive pulmonary disease, unspecified: Secondary | ICD-10-CM | POA: Diagnosis not present

## 2017-02-27 DIAGNOSIS — K219 Gastro-esophageal reflux disease without esophagitis: Secondary | ICD-10-CM | POA: Diagnosis not present

## 2017-02-27 DIAGNOSIS — I10 Essential (primary) hypertension: Secondary | ICD-10-CM | POA: Diagnosis not present

## 2017-02-27 DIAGNOSIS — C8590 Non-Hodgkin lymphoma, unspecified, unspecified site: Secondary | ICD-10-CM | POA: Insufficient documentation

## 2017-02-27 DIAGNOSIS — Z85828 Personal history of other malignant neoplasm of skin: Secondary | ICD-10-CM | POA: Diagnosis not present

## 2017-02-27 DIAGNOSIS — Z79899 Other long term (current) drug therapy: Secondary | ICD-10-CM | POA: Insufficient documentation

## 2017-02-27 DIAGNOSIS — M109 Gout, unspecified: Secondary | ICD-10-CM | POA: Diagnosis not present

## 2017-02-27 DIAGNOSIS — G4733 Obstructive sleep apnea (adult) (pediatric): Secondary | ICD-10-CM | POA: Insufficient documentation

## 2017-02-27 HISTORY — PX: PORTACATH PLACEMENT: SHX2246

## 2017-02-27 LAB — GLUCOSE, CAPILLARY
GLUCOSE-CAPILLARY: 123 mg/dL — AB (ref 65–99)
Glucose-Capillary: 266 mg/dL — ABNORMAL HIGH (ref 65–99)

## 2017-02-27 SURGERY — INSERTION, TUNNELED CENTRAL VENOUS DEVICE, WITH PORT
Anesthesia: General | Site: Chest | Laterality: Left

## 2017-02-27 MED ORDER — PROPOFOL 10 MG/ML IV BOLUS
INTRAVENOUS | Status: AC
  Filled 2017-02-27: qty 20

## 2017-02-27 MED ORDER — LIDOCAINE HCL (CARDIAC) 20 MG/ML IV SOLN
INTRAVENOUS | Status: DC | PRN
  Administered 2017-02-27 (×2): 50 mg via INTRAVENOUS

## 2017-02-27 MED ORDER — ONDANSETRON HCL 4 MG/2ML IJ SOLN
INTRAMUSCULAR | Status: DC | PRN
  Administered 2017-02-27: 4 mg via INTRAVENOUS

## 2017-02-27 MED ORDER — HYDROMORPHONE HCL 1 MG/ML IJ SOLN: 0.2500 mg | INTRAMUSCULAR | Status: DC | PRN

## 2017-02-27 MED ORDER — MEPERIDINE HCL 50 MG/ML IJ SOLN: 6.2500 mg | INTRAMUSCULAR | Status: DC | PRN

## 2017-02-27 MED ORDER — HEPARIN SOD (PORK) LOCK FLUSH 100 UNIT/ML IV SOLN
INTRAVENOUS | Status: AC
  Filled 2017-02-27: qty 5

## 2017-02-27 MED ORDER — CEFAZOLIN SODIUM-DEXTROSE 2-4 GM/100ML-% IV SOLN
2.0000 g | INTRAVENOUS | Status: AC
  Administered 2017-02-27: 2 g via INTRAVENOUS
  Filled 2017-02-27: qty 100

## 2017-02-27 MED ORDER — CHLORHEXIDINE GLUCONATE CLOTH 2 % EX PADS: 6.0000 | MEDICATED_PAD | Freq: Once | CUTANEOUS | Status: DC

## 2017-02-27 MED ORDER — PHENYLEPHRINE 40 MCG/ML (10ML) SYRINGE FOR IV PUSH (FOR BLOOD PRESSURE SUPPORT)
PREFILLED_SYRINGE | INTRAVENOUS | Status: DC | PRN
  Administered 2017-02-27 (×2): 80 ug via INTRAVENOUS

## 2017-02-27 MED ORDER — LIDOCAINE HCL (PF) 1 % IJ SOLN
INTRAMUSCULAR | Status: DC | PRN
  Administered 2017-02-27: 5 mL

## 2017-02-27 MED ORDER — HEPARIN SOD (PORK) LOCK FLUSH 100 UNIT/ML IV SOLN
INTRAVENOUS | Status: DC | PRN
  Administered 2017-02-27: 500 [IU]

## 2017-02-27 MED ORDER — PROPOFOL 10 MG/ML IV BOLUS
INTRAVENOUS | Status: DC | PRN
  Administered 2017-02-27: 50 mg via INTRAVENOUS
  Administered 2017-02-27: 150 mg via INTRAVENOUS

## 2017-02-27 MED ORDER — ACETAMINOPHEN 10 MG/ML IV SOLN: 1000.0000 mg | Freq: Once | INTRAVENOUS | Status: DC | PRN

## 2017-02-27 MED ORDER — HYDROCODONE-ACETAMINOPHEN 7.5-325 MG PO TABS: 1.0000 | ORAL_TABLET | Freq: Once | ORAL | Status: DC | PRN

## 2017-02-27 MED ORDER — HEPARIN SODIUM (PORCINE) 5000 UNIT/ML IJ SOLN
5000.0000 [IU] | Freq: Once | INTRAMUSCULAR | Status: AC
  Administered 2017-02-27: 5000 [IU] via SUBCUTANEOUS
  Filled 2017-02-27: qty 1

## 2017-02-27 MED ORDER — CEFAZOLIN SODIUM-DEXTROSE 2-4 GM/100ML-% IV SOLN: 2.0000 g | INTRAVENOUS | Status: DC

## 2017-02-27 MED ORDER — FENTANYL CITRATE (PF) 100 MCG/2ML IJ SOLN
INTRAMUSCULAR | Status: AC
  Filled 2017-02-27: qty 2

## 2017-02-27 MED ORDER — OXYCODONE HCL 5 MG PO TABS: 5.0000 mg | ORAL_TABLET | ORAL | Status: DC | PRN

## 2017-02-27 MED ORDER — 0.9 % SODIUM CHLORIDE (POUR BTL) OPTIME
TOPICAL | Status: DC | PRN
  Administered 2017-02-27: 1000 mL

## 2017-02-27 MED ORDER — HEPARIN SODIUM (PORCINE) 5000 UNIT/ML IJ SOLN: 5000.0000 [IU] | Freq: Once | INTRAMUSCULAR | Status: DC

## 2017-02-27 MED ORDER — LIDOCAINE HCL (PF) 1 % IJ SOLN
INTRAMUSCULAR | Status: AC
  Filled 2017-02-27: qty 30

## 2017-02-27 MED ORDER — MIDAZOLAM HCL 2 MG/2ML IJ SOLN
INTRAMUSCULAR | Status: AC
  Filled 2017-02-27: qty 2

## 2017-02-27 MED ORDER — MIDAZOLAM HCL 5 MG/5ML IJ SOLN
INTRAMUSCULAR | Status: DC | PRN
  Administered 2017-02-27: 2 mg via INTRAVENOUS

## 2017-02-27 MED ORDER — LACTATED RINGERS IV SOLN
INTRAVENOUS | Status: DC
  Administered 2017-02-27 (×2): via INTRAVENOUS

## 2017-02-27 MED ORDER — DEXAMETHASONE SODIUM PHOSPHATE 4 MG/ML IJ SOLN
INTRAMUSCULAR | Status: DC | PRN
  Administered 2017-02-27: 10 mg via INTRAVENOUS

## 2017-02-27 MED ORDER — PHENYLEPHRINE 40 MCG/ML (10ML) SYRINGE FOR IV PUSH (FOR BLOOD PRESSURE SUPPORT)
PREFILLED_SYRINGE | INTRAVENOUS | Status: AC
  Filled 2017-02-27: qty 10

## 2017-02-27 MED ORDER — ACETAMINOPHEN 325 MG PO TABS: 650.0000 mg | ORAL_TABLET | ORAL | Status: DC | PRN

## 2017-02-27 MED ORDER — SODIUM CHLORIDE 0.9 % IV SOLN
Freq: Once | INTRAVENOUS | Status: AC
  Administered 2017-02-27: 12:00:00
  Filled 2017-02-27: qty 1.2

## 2017-02-27 MED ORDER — HYDROCODONE-ACETAMINOPHEN 5-325 MG PO TABS: 1.0000 | ORAL_TABLET | Freq: Four times a day (QID) | ORAL | 0 refills | Status: DC | PRN

## 2017-02-27 MED ORDER — FENTANYL CITRATE (PF) 100 MCG/2ML IJ SOLN
INTRAMUSCULAR | Status: DC | PRN
  Administered 2017-02-27 (×4): 25 ug via INTRAVENOUS

## 2017-02-27 MED ORDER — PROMETHAZINE HCL 25 MG/ML IJ SOLN: 6.2500 mg | INTRAMUSCULAR | Status: DC | PRN

## 2017-02-27 MED ORDER — ONDANSETRON HCL 4 MG/2ML IJ SOLN
INTRAMUSCULAR | Status: AC
  Filled 2017-02-27: qty 2

## 2017-02-27 MED ORDER — LIDOCAINE 2% (20 MG/ML) 5 ML SYRINGE
INTRAMUSCULAR | Status: AC
  Filled 2017-02-27: qty 5

## 2017-02-27 MED ORDER — DEXAMETHASONE SODIUM PHOSPHATE 10 MG/ML IJ SOLN
INTRAMUSCULAR | Status: AC
  Filled 2017-02-27: qty 1

## 2017-02-27 MED ORDER — ACETAMINOPHEN 650 MG RE SUPP
650.0000 mg | RECTAL | Status: DC | PRN
  Filled 2017-02-27: qty 1

## 2017-02-27 SURGICAL SUPPLY — 38 items
APL SKNCLS STERI-STRIP NONHPOA (GAUZE/BANDAGES/DRESSINGS) ×1
BAG DECANTER FOR FLEXI CONT (MISCELLANEOUS) ×2 IMPLANT
BENZOIN TINCTURE PRP APPL 2/3 (GAUZE/BANDAGES/DRESSINGS) ×1 IMPLANT
BLADE HEX COATED 2.75 (ELECTRODE) ×2 IMPLANT
BLADE SURG 15 STRL LF DISP TIS (BLADE) ×1 IMPLANT
BLADE SURG 15 STRL SS (BLADE) ×2
COVER SURGICAL LIGHT HANDLE (MISCELLANEOUS) ×2 IMPLANT
DECANTER SPIKE VIAL GLASS SM (MISCELLANEOUS) ×2 IMPLANT
DRAPE C-ARM 42X120 X-RAY (DRAPES) ×2 IMPLANT
DRAPE LAPAROTOMY T 98X78 PEDS (DRAPES) ×2 IMPLANT
ELECT PENCIL ROCKER SW 15FT (MISCELLANEOUS) ×2 IMPLANT
ELECT REM PT RETURN 15FT ADLT (MISCELLANEOUS) ×2 IMPLANT
GAUZE SPONGE 2X2 8PLY STRL LF (GAUZE/BANDAGES/DRESSINGS) IMPLANT
GAUZE SPONGE 4X4 12PLY STRL (GAUZE/BANDAGES/DRESSINGS) ×1 IMPLANT
GAUZE SPONGE 4X4 16PLY XRAY LF (GAUZE/BANDAGES/DRESSINGS) ×2 IMPLANT
GLOVE BIOGEL M 8.0 STRL (GLOVE) ×2 IMPLANT
GLOVE BIOGEL PI IND STRL 7.0 (GLOVE) ×1 IMPLANT
GLOVE BIOGEL PI INDICATOR 7.0 (GLOVE) ×1
GOWN SPEC L4 XLG W/TWL (GOWN DISPOSABLE) ×2 IMPLANT
GOWN STRL REUS W/TWL XL LVL3 (GOWN DISPOSABLE) ×6 IMPLANT
IV CONNECTOR ONE LINK NDLESS (IV SETS) ×2 IMPLANT
KIT BASIN OR (CUSTOM PROCEDURE TRAY) ×2 IMPLANT
KIT PORT POWER 8FR ISP CVUE (Miscellaneous) ×2 IMPLANT
NEEDLE HYPO 22GX1.5 SAFETY (NEEDLE) ×2 IMPLANT
NS IRRIG 1000ML POUR BTL (IV SOLUTION) ×2 IMPLANT
PACK BASIC VI WITH GOWN DISP (CUSTOM PROCEDURE TRAY) ×2 IMPLANT
SPONGE GAUZE 2X2 STER 10/PKG (GAUZE/BANDAGES/DRESSINGS)
STRIP CLOSURE SKIN 1/2X4 (GAUZE/BANDAGES/DRESSINGS) ×2 IMPLANT
SUT PROLENE 2 0 CT2 30 (SUTURE) ×2 IMPLANT
SUT PROLENE 2 0 SH DA (SUTURE) IMPLANT
SUT VIC AB 4-0 SH 18 (SUTURE) ×2 IMPLANT
SYR 10ML ECCENTRIC (SYRINGE) ×2 IMPLANT
SYR 10ML LL (SYRINGE) ×2 IMPLANT
SYR BULB IRRIGATION 50ML (SYRINGE) IMPLANT
SYR CONTROL 10ML LL (SYRINGE) ×2 IMPLANT
TAPE CLOTH SURG 4X10 WHT LF (GAUZE/BANDAGES/DRESSINGS) ×2 IMPLANT
TOWEL OR 17X26 10 PK STRL BLUE (TOWEL DISPOSABLE) ×2 IMPLANT
TOWEL OR NON WOVEN STRL DISP B (DISPOSABLE) ×2 IMPLANT

## 2017-02-27 NOTE — Anesthesia Preprocedure Evaluation (Addendum)
Anesthesia Evaluation  Patient identified by MRN, date of birth, ID band Patient awake    Reviewed: Allergy & Precautions, NPO status , Patient's Chart, lab work & pertinent test results  Airway Mallampati: II  TM Distance: >3 FB Neck ROM: Full    Dental no notable dental hx.    Pulmonary neg pulmonary ROS, COPD, former smoker,    Pulmonary exam normal breath sounds clear to auscultation       Cardiovascular hypertension, negative cardio ROS Normal cardiovascular exam Rhythm:Regular Rate:Normal     Neuro/Psych negative neurological ROS  negative psych ROS   GI/Hepatic negative GI ROS, Neg liver ROS,   Endo/Other  negative endocrine ROSdiabetes  Renal/GU negative Renal ROS  negative genitourinary   Musculoskeletal negative musculoskeletal ROS (+)   Abdominal   Peds  Hematology negative hematology ROS (+)   Anesthesia Other Findings   Reproductive/Obstetrics                            Anesthesia Physical Anesthesia Plan  ASA: III  Anesthesia Plan: General   Post-op Pain Management:    Induction: Intravenous  PONV Risk Score and Plan: Treatment may vary due to age or medical condition  Airway Management Planned: LMA  Additional Equipment:   Intra-op Plan:   Post-operative Plan: Extubation in OR  Informed Consent: I have reviewed the patients History and Physical, chart, labs and discussed the procedure including the risks, benefits and alternatives for the proposed anesthesia with the patient or authorized representative who has indicated his/her understanding and acceptance.   Dental advisory given  Plan Discussed with: CRNA and Anesthesiologist  Anesthesia Plan Comments:         Anesthesia Quick Evaluation

## 2017-02-27 NOTE — Discharge Instructions (Signed)
Implanted Port Insertion, Care After °This sheet gives you information about how to care for yourself after your procedure. Your health care provider may also give you more specific instructions. If you have problems or questions, contact your health care provider. °What can I expect after the procedure? °After your procedure, it is common to have: °· Discomfort at the port insertion site. °· Bruising on the skin over the port. This should improve over 3-4 days. ° °Follow these instructions at home: °Port care °· After your port is placed, you will get a manufacturer's information card. The card has information about your port. Keep this card with you at all times. °· Take care of the port as told by your health care provider. Ask your health care provider if you or a family member can get training for taking care of the port at home. A home health care nurse may also take care of the port. °· Make sure to remember what type of port you have. °Incision care °· Follow instructions from your health care provider about how to take care of your port insertion site. Make sure you: °? Wash your hands with soap and water before you change your bandage (dressing). If soap and water are not available, use hand sanitizer. °? Change your dressing as told by your health care provider. °? Leave stitches (sutures), skin glue, or adhesive strips in place. These skin closures may need to stay in place for 2 weeks or longer. If adhesive strip edges start to loosen and curl up, you may trim the loose edges. Do not remove adhesive strips completely unless your health care provider tells you to do that. °· Check your port insertion site every day for signs of infection. Check for: °? More redness, swelling, or pain. °? More fluid or blood. °? Warmth. °? Pus or a bad smell. °General instructions °· Do not take baths, swim, or use a hot tub until your health care provider approves. °· Do not lift anything that is heavier than 10 lb (4.5  kg) for a week, or as told by your health care provider. °· Ask your health care provider when it is okay to: °? Return to work or school. °? Resume usual physical activities or sports. °· Do not drive for 24 hours if you were given a medicine to help you relax (sedative). °· Take over-the-counter and prescription medicines only as told by your health care provider. °· Wear a medical alert bracelet in case of an emergency. This will tell any health care providers that you have a port. °· Keep all follow-up visits as told by your health care provider. This is important. °Contact a health care provider if: °· You cannot flush your port with saline as directed, or you cannot draw blood from the port. °· You have a fever or chills. °· You have more redness, swelling, or pain around your port insertion site. °· You have more fluid or blood coming from your port insertion site. °· Your port insertion site feels warm to the touch. °· You have pus or a bad smell coming from the port insertion site. °Get help right away if: °· You have chest pain or shortness of breath. °· You have bleeding from your port that you cannot control. °Summary °· Take care of the port as told by your health care provider. °· Change your dressing as told by your health care provider. °· Keep all follow-up visits as told by your health care provider. °  This information is not intended to replace advice given to you by your health care provider. Make sure you discuss any questions you have with your health care provider. °Document Released: 11/06/2012 Document Revised: 12/08/2015 Document Reviewed: 12/08/2015 °Elsevier Interactive Patient Education © 2017 [employer] Inc. ° °

## 2017-02-27 NOTE — Op Note (Signed)
Surgeon: Kaylyn Lim, MD, FACS  Asst:  none  Anes:  General by LMA  Preop Dx: lymphoma Postop Dx: same  Procedure: Placement of left subclavian portacath Location Surgery: [facility] OR 1 Complications: None noted  EBL:   8 cc  Drains: none  Description of Procedure:  The patient was taken to OR 1 .  After anesthesia was administered and the patient was prepped a timeout was performed.  The left sub clavicular area was entered with the needle and the subclavian vein was entered on the first stick.  The wire passed easily.  The position was confirmed with C arm.  A pocket for the port was created for the 8 Fr. Catheter low profile port.  The catheter was tunnelled to the wire exit site.  The introducer sheath was passed over the wire and under fluro the catheter was pass to about 25 cm and at the cavo-atrial junction but there was no movement of the catheter to suspect that it was in too far.  Blood return was good.  The catheter was connected to the port and placed in the pocket and secured with a 2-0 prolene.  The port was accessed with an angular Huber needle to maintain access for subsequent access  And then flushed with concentrated aqueous heparin.  The incisions meanwhile had been closed with 4-0 vicryl with benzoin and steristrips.    The patient tolerated the procedure well and was taken to the PACU in stable condition.  A chest xray was obtained in the PACU.     Matt B. Hassell Done, [place], [facility], [place]

## 2017-02-27 NOTE — Transfer of Care (Signed)
Immediate Anesthesia Transfer of Care Note  Patient: Jeffery Wood  Procedure(s) Performed: Procedure(s): INSERTION PORT-A-CATH (Left)  Patient Location: PACU  Anesthesia Type:General  Level of Consciousness: Patient easily awoken, sedated, comfortable, cooperative, following commands, responds to stimulation.   Airway & Oxygen Therapy: Patient spontaneously breathing, ventilating well, oxygen via simple oxygen mask.  Post-op Assessment: Report given to PACU RN, vital signs reviewed and stable, moving all extremities.   Post vital signs: Reviewed and stable.  Complications: No apparent anesthesia complications Last Vitals:  Vitals:   02/27/17 0805 02/27/17 1217  BP: 112/62 112/61  Pulse: (!) 103 95  Resp: 18 (P) 15  Temp: 36.9 C (P) 36.6 C  SpO2: 96% 100%    Last Pain:  Vitals:   02/27/17 0819  TempSrc:   PainSc: 3          Complications: No apparent anesthesia complications

## 2017-02-27 NOTE — Progress Notes (Signed)
PCXR results WNL

## 2017-02-27 NOTE — Anesthesia Procedure Notes (Signed)
Procedure Name: LMA Insertion Date/Time: 02/27/2017 11:18 AM Performed by: Deliah Boston, CRNA Pre-anesthesia Checklist: Patient identified, Emergency Drugs available, Suction available and Patient being monitored Patient Re-evaluated:Patient Re-evaluated prior to induction Oxygen Delivery Method: Circle system utilized Preoxygenation: Pre-oxygenation with 100% oxygen Induction Type: IV induction Ventilation: Mask ventilation without difficulty LMA: LMA inserted LMA Size: 5.0 Number of attempts: 1 Placement Confirmation: positive ETCO2 and breath sounds checked- equal and bilateral Tube secured with: Tape Dental Injury: Teeth and Oropharynx as per pre-operative assessment

## 2017-02-27 NOTE — H&P (Signed)
Chief Complaint:  IV access for chemotherapy  History of Present Illness:  Jeffery Wood is an 62 y.o. male who has a recent diagnosis of new onset lymphoma.  He needs IV access for chemotherapy.   Past Medical History:  Diagnosis Date  . Arthritis   . Asthma   . Basal cell carcinoma (BCC) of right temple region   . Cancer ([place])    basal cell of right temple; carcinoids  . Complication of anesthesia    hard to wake up after bronchoscopy  . COPD (chronic obstructive pulmonary disease) ([place])   . Diabetes ([place])   . Dyspnea   . GERD (gastroesophageal reflux disease)   . Gout   . Headache   . History of hiatal hernia 05/16/2006   small noted on EGD  . Hypertension   . Lung mass   . 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  . Sleep apnea    uses mouth piece    Past Surgical History:  Procedure Laterality Date  . BICEPS TENDON REPAIR Left   . CARPAL TUNNEL RELEASE    . COLONOSCOPY    . KNEE ARTHROSCOPY    . LEFT HEART CATHETERIZATION WITH CORONARY ANGIOGRAM N/A 12/07/2010   Procedure: LEFT HEART CATHETERIZATION WITH CORONARY ANGIOGRAM;  Surgeon: Leonie Man, MD;  Location: [facility] CATH LAB;  Service: Cardiovascular;  Laterality: N/A;  . PILONIDAL CYST EXCISION    . UPPER GASTROINTESTINAL ENDOSCOPY  05/16/2006  . VIDEO BRONCHOSCOPY WITH ENDOBRONCHIAL ULTRASOUND N/A 01/24/2017   Procedure: VIDEO BRONCHOSCOPY WITH ENDOBRONCHIAL ULTRASOUND with TRANSBRONCHIAL BIOPSY;  Surgeon: Grace Isaac, MD;  Location: [place];  Service: Thoracic;  Laterality: N/A;  . VIDEO MEDIASTINOSCOPY N/A 02/05/2017   Procedure: VIDEO MEDIASTINOSCOPY;  Surgeon: Grace Isaac, MD;  Location: [place];  Service: Thoracic;  Laterality: N/A;    Current Facility-Administered Medications  Medication Dose Route Frequency Provider Last Rate Last Dose  . ceFAZolin (ANCEF) IVPB 2g/100 mL premix  2 g Intravenous On Call to OR [name], MD      . Chlorhexidine Gluconate Cloth 2 % PADS 6 each  6 each Topical Once  [name], MD       And  . Chlorhexidine Gluconate Cloth 2 % PADS 6 each  6 each Topical Once [name], MD      . heparin 6,000 Units in sodium chloride 0.9 % 500 mL irrigation   Irrigation Once [name], MD      . lactated ringers infusion   Intravenous Continuous [name], MD 50 mL/hr at 02/27/17 8413     Patient has no known allergies. Family History  Problem Relation Age of Onset  . Emphysema Mother   . Asthma Mother   . Heart disease Father    Social History:   reports that he quit smoking about 29 years ago. His smoking use included cigarettes. He has a 60.00 pack-year smoking history. he has never used smokeless tobacco. He reports that he does not drink alcohol or use drugs.   REVIEW OF SYSTEMS : Negative except for see problem list  Physical Exam:   Blood pressure 112/62, pulse (!) 103, temperature 98.5 F (36.9 C), temperature source Oral, resp. rate 18, height 6\' 3"  (1.905 m), weight 109.8 kg (242 lb), SpO2 96 %. Body mass index is 30.25 kg/m.  Gen:  WDWN WM NAD  Neurological: Alert and oriented to person, place, and time. Motor and sensory function is grossly intact  Head: Normocephalic  and atraumatic.  Eyes: Conjunctivae are normal. Pupils are equal, round, and reactive to light. No scleral icterus.  Neck: Normal range of motion. Neck supple. No tracheal deviation or thyromegaly present.  Cardiovascular:  SR without murmurs or gallops.  No carotid bruits Respiratory: Effort normal but compromised from baseline.  Abdomen:  nontender Musculoskeletal: Normal range of motion. Extremities are nontender. No cyanosis, edema or clubbing noted Lymphadenopathy: No cervical, preauricular, postauricular or axillary adenopathy is present Skin: Skin is warm and dry. No rash noted. No diaphoresis. No erythema. No pallor. Pscyh: Normal mood and affect. Behavior is normal. Judgment and thought content normal.   LABORATORY RESULTS: Results for orders  placed or performed during the hospital encounter of 02/27/17 (from the past 48 hour(s))  Glucose, capillary     Status: Abnormal   Collection Time: 02/27/17  8:00 AM  Result Value Ref Range   Glucose-Capillary 266 (H) 65 - 99 mg/dL   Comment 1 Notify RN      RADIOLOGY RESULTS: No results found.  Problem List: Patient Active Problem List   Diagnosis Date Noted  . 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 NHL (non-Hodgkin's lymphoma) ([place]) 02/07/2017  . OSA (obstructive sleep apnea) 07/22/2013  . Chest pain, non-cardiac 12/07/2010  . Diabetes mellitus 12/07/2010    Assessment & Plan: Need for IV access; portacath placement left subclavian.      Matt B. Hassell Done, MD, [facility] [phone] beeper [phone]  02/27/2017 10:09 AM

## 2017-02-27 NOTE — Anesthesia Postprocedure Evaluation (Signed)
Anesthesia Post Note  Patient: Jeffery Wood  Procedure(s) Performed: INSERTION PORT-A-CATH (Left Chest)     Patient location during evaluation: PACU Anesthesia Type: General Level of consciousness: awake and alert Pain management: pain level controlled Vital Signs Assessment: post-procedure vital signs reviewed and stable Respiratory status: spontaneous breathing, nonlabored ventilation, respiratory function stable and patient connected to nasal cannula oxygen Cardiovascular status: blood pressure returned to baseline and stable Postop Assessment: no apparent nausea or vomiting Anesthetic complications: no    Last Vitals:  Vitals:   02/27/17 1217 02/27/17 1230  BP: 112/61 103/61  Pulse: 95 95  Resp: 15 16  Temp: 36.6 C   SpO2: 100% 100%    Last Pain:  Vitals:   02/27/17 0819  TempSrc:   PainSc: 3                  [name]

## 2017-02-28 ENCOUNTER — Ambulatory Visit (HOSPITAL_COMMUNITY): Payer: BC Managed Care – PPO

## 2017-02-28 ENCOUNTER — Encounter (HOSPITAL_COMMUNITY): Payer: Self-pay | Admitting: Surgery

## 2017-02-28 ENCOUNTER — Other Ambulatory Visit (HOSPITAL_COMMUNITY): Payer: BC Managed Care – PPO

## 2017-03-01 ENCOUNTER — Inpatient Hospital Stay: Payer: BC Managed Care – PPO

## 2017-03-01 ENCOUNTER — Telehealth: Payer: Self-pay

## 2017-03-01 ENCOUNTER — Inpatient Hospital Stay (HOSPITAL_BASED_OUTPATIENT_CLINIC_OR_DEPARTMENT_OTHER): Payer: BC Managed Care – PPO | Admitting: Oncology

## 2017-03-01 VITALS — BP 135/71 | HR 92 | Temp 98.6°F | Resp 19 | Ht 75.0 in | Wt 248.3 lb

## 2017-03-01 DIAGNOSIS — Z87891 Personal history of nicotine dependence: Secondary | ICD-10-CM | POA: Diagnosis not present

## 2017-03-01 DIAGNOSIS — R222 Localized swelling, mass and lump, trunk: Secondary | ICD-10-CM | POA: Diagnosis not present

## 2017-03-01 DIAGNOSIS — R0602 Shortness of breath: Secondary | ICD-10-CM

## 2017-03-01 DIAGNOSIS — C8330 Diffuse large B-cell lymphoma, unspecified site: Secondary | ICD-10-CM

## 2017-03-01 DIAGNOSIS — G893 Neoplasm related pain (acute) (chronic): Secondary | ICD-10-CM

## 2017-03-01 DIAGNOSIS — Z95828 Presence of other vascular implants and grafts: Secondary | ICD-10-CM

## 2017-03-01 DIAGNOSIS — C8338 Diffuse large B-cell lymphoma, lymph nodes of multiple sites: Secondary | ICD-10-CM | POA: Diagnosis not present

## 2017-03-01 DIAGNOSIS — K5903 Drug induced constipation: Secondary | ICD-10-CM

## 2017-03-01 LAB — CBC WITH DIFFERENTIAL (CANCER CENTER ONLY)
Basophils Absolute: 0.2 10*3/uL — ABNORMAL HIGH (ref 0.0–0.1)
Basophils Relative: 2 %
EOS PCT: 0 %
Eosinophils Absolute: 0 10*3/uL (ref 0.0–0.5)
HEMATOCRIT: 40.1 % (ref 38.4–49.9)
Hemoglobin: 12.9 g/dL — ABNORMAL LOW (ref 13.0–17.1)
LYMPHS ABS: 1.5 10*3/uL (ref 0.9–3.3)
LYMPHS PCT: 16 %
MCH: 29 pg (ref 27.2–33.4)
MCHC: 32.2 g/dL (ref 32.0–36.0)
MCV: 90.1 fL (ref 79.3–98.0)
MONO ABS: 1.2 10*3/uL — AB (ref 0.1–0.9)
Monocytes Relative: 13 %
NEUTROS ABS: 6.3 10*3/uL (ref 1.5–6.5)
Neutrophils Relative %: 69 %
Platelet Count: 441 10*3/uL — ABNORMAL HIGH (ref 140–400)
RBC: 4.45 MIL/uL (ref 4.20–5.82)
RDW: 15.2 % — AB (ref 11.0–14.6)
WBC Count: 9.1 10*3/uL (ref 4.0–10.3)

## 2017-03-01 LAB — CMP (CANCER CENTER ONLY)
ALBUMIN: 3.1 g/dL — AB (ref 3.5–5.0)
ALT: 16 U/L (ref 0–55)
ANION GAP: 9 (ref 3–11)
AST: 20 U/L (ref 5–34)
Alkaline Phosphatase: 81 U/L (ref 40–150)
BUN: 11 mg/dL (ref 7–26)
CHLORIDE: 102 mmol/L (ref 98–109)
CO2: 29 mmol/L (ref 22–29)
Calcium: 9.2 mg/dL (ref 8.4–10.4)
Creatinine: 0.66 mg/dL — ABNORMAL LOW (ref 0.70–1.30)
GFR, Est AFR Am: >60 mL/min (ref >60)
GFR, Estimated: >60 mL/min (ref >60)
GLUCOSE: 82 mg/dL (ref 70–140)
POTASSIUM: 4 mmol/L (ref 3.5–5.1)
SODIUM: 140 mmol/L (ref 136–145)
Total Bilirubin: 0.2 mg/dL (ref 0.2–1.2)
Total Protein: 6.4 g/dL (ref 6.4–8.3)

## 2017-03-01 MED ORDER — SODIUM CHLORIDE 0.9% FLUSH
10.0000 mL | INTRAVENOUS | Status: DC | PRN
  Administered 2017-03-01: 10 mL via INTRAVENOUS
  Filled 2017-03-01: qty 10

## 2017-03-01 MED ORDER — HEPARIN SOD (PORK) LOCK FLUSH 100 UNIT/ML IV SOLN
500.0000 [IU] | Freq: Once | INTRAVENOUS | Status: AC | PRN
  Administered 2017-03-01: 500 [IU] via INTRAVENOUS
  Filled 2017-03-01: qty 5

## 2017-03-01 MED ORDER — HEPARIN SOD (PORK) LOCK FLUSH 100 UNIT/ML IV SOLN
500.0000 [IU] | Freq: Once | INTRAVENOUS | Status: AC
  Administered 2017-03-01: 500 [IU] via INTRAVENOUS
  Filled 2017-03-01: qty 5

## 2017-03-01 NOTE — Patient Instructions (Signed)

## 2017-03-01 NOTE — Progress Notes (Signed)
START OFF PATHWAY REGIMEN - Lymphoma and CLL   [id number]:EPOCH-R q21 Days:   A cycle is every 21 days:     Rituximab      Etoposide      Doxorubicin      Vincristine      Cyclophosphamide      Prednisone      [id number]-[id number]   **Always confirm dose/schedule in your pharmacy ordering system**    Patient Characteristics: Diffuse Large B-Cell Lymphoma, First Line, Stage III and IV Disease Type: Not Applicable Disease Type: Diffuse Large B-Cell Lymphoma Disease Type: Not Applicable Line of therapy: First Line [place] Stage: IV Intent of Therapy: Curative Intent, Discussed with Patient

## 2017-03-01 NOTE — Progress Notes (Signed)
[place] OFFICE PROGRESS NOTE   Diagnosis: Non-Hodgkin's lymphoma  INTERVAL HISTORY:   Jeffery Wood reports continued improvement in dyspnea and orthopnea.  He is now able to sleep in bed.  He continues to have pain in the right arm.  The pain is worse at night.  He takes oxycodone when the pain is more severe. No fever.  His bowels are moving.  No change in baseline neuropathy symptoms. He underwent Port-A-Cath placement by Dr. Hassell Done 02/27/2017.  He reports tolerating procedure well. Objective:  Vital signs in last 24 hours:  Blood pressure 135/71, pulse 92, temperature 98.6 F (37 C), temperature source Oral, resp. rate 19, height 6' 3"  (1.905 m), weight 248 lb 4.8 oz (112.6 kg), SpO2 97 %.    HEENT: No thrush, resolving ulcer at the right buccal mucosa Lymphatics: No cervical or supraclavicular nodes Resp: Lungs clear bilaterally Cardio: Regular rate and rhythm GI: No hepatosplenomegaly Vascular: No leg or arm edema     Portacath/PICC-without erythema  Lab Results:  Lab Results  Component Value Date   WBC 9.1 03/01/2017   HGB 12.3 (L) 02/07/2017   HCT 40.1 03/01/2017   MCV 90.1 03/01/2017   PLT 441 (H) 03/01/2017   NEUTROABS 6.3 03/01/2017    CMP     Component Value Date/Time   NA 134 (L) 02/10/2017 0359   K 4.1 02/10/2017 0359   CL 101 02/10/2017 0359   CO2 26 02/10/2017 0359   GLUCOSE 113 (H) 02/10/2017 0359   BUN 15 02/10/2017 0359   CREATININE 0.55 (L) 02/10/2017 0359   CALCIUM 8.9 02/10/2017 0359   PROT 5.9 (L) 02/10/2017 0359   ALBUMIN 2.9 (L) 02/10/2017 0359   AST 34 02/10/2017 0359   ALT 27 02/10/2017 0359   ALKPHOS 53 02/10/2017 0359   BILITOT 0.5 02/10/2017 0359   GFRNONAA >60 02/10/2017 0359   GFRAA >60 02/10/2017 0359    No results found for: CEA1  Lab Results  Component Value Date   INR 1.05 02/05/2017    Imaging:  Dg Chest Port 1 View  Result Date: 02/27/2017 CLINICAL DATA:  History of lymphoma, port placement  EXAM: PORTABLE CHEST 1 VIEW COMPARISON:  Chest x-ray of 02/12/2017 FINDINGS: A left-sided Port-A-Cath has been placed with the tip extending into the region of the right atrium. No pneumothorax is seen. Right perihilar soft tissue opacity remains with linear atelectasis extending laterally. The left lung appears clear. Heart size is stable. No bony abnormality is noted. IMPRESSION: 1. Left-sided Port-A-Cath tip is seen to the region of the right atrium. No pneumothorax. 2. Right perihilar soft tissue is unchanged with linear atelectasis or scarring extending peripherally. Electronically Signed   By: Ivar Drape M.D.   On: 02/27/2017 12:43   Dg C-arm 1-60 Min-no Report  Result Date: 02/27/2017 Fluoroscopy was utilized by the requesting physician.  No radiographic interpretation.    Medications: I have reviewed the patient's current medications.   Assessment/Plan: 1. High-grade B-cell non-Hodgkin's lymphoma, molecular studies pending, clinical stage IV, IPI-low intermediate risk, FISH panel revealed an [phone] fusion (MYC/IgH), negative for BCL 6 and BCL-2  Chest x-ray 01/15/2017-medial right upper lobe/perihilar mass.   Chest CT 01/19/2017-mass constricting the superior vena cava, subcarinal adenopathy, right suprahilar/mediastinal mass.   12/26/2018status postbronchoscopy with transbronchial biopsy of level 7node and biopsy of aright upper lobe lung mass by Dr. Servando Snare. There was compression of the right mainstem bronchus. A mass was noted in the right upper lobe. Mediastinal lymph nodes were  seen on EBUS. Multiple biopsies of a level 7 node were obtained. Brushings and a biopsy of the right upper lobe mass were obtained. 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 Final pathology was nondiagnostic.   PET scan on 02/02/2017 showed hypermetabolism corresponding to the right sided mediastinal mass,progressive since the CT 01/19/2017. Metastatic disease/lymphoma  within the bones, pericardium, stomach, bowel and abdominopelvic nodal stations.   Brain MRI 02/03/2017 showed no evidence of intracranial metastases or acute abnormality.   1/7/2019status postmediastinoscopy.  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 Molecular studies are pending.  Rituximab 02/08/2017  Cycle 1 CHOP 02/09/2017 2. Chest/upper backand right armpain secondary to #1 3. Exertional dyspnea secondary to #1 and COPD 4. Asthma 5. Diabetes mellitus 6. Gout 7. Remote history of "carcinoid syndrome" 8. Remote history of tobacco use 9. Basal cell carcinoma removed from the right face 10. Chronic low back pain status post epidural steroid injections-followed at a pain management clinic 11. 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 Constipation secondary to narcotic analgesics and potentially lymphoma involving the GI tract-improved.    Disposition: Jeffery Wood has experienced a clinical response to cycle 1 R-CHOP.  We receive the molecular testing from the mediastinal mass biopsy and the tumor has an 8: 14 fusion.  He has advanced stage lymphoma resenting with a bulky mediastinal mass and bone/bowel involvement with lymphoma.  I discussed the case with the hematopathology was again this week.  He indicates the histology is more consistent with large B-cell lymphoma as opposed to a "[place] "type lymphoma.  I reviewed the case with multiple colleagues and the [place] service at [facility].  The consensus recommendation is to proceed with R-EPOCH therapy.  He will be scheduled to begin intrathecal prophylaxis with cycle 3.  Jeffery Wood will be admitted for the second cycle of systemic therapy (cycle 1 R-EPOCH) on 03/02/2017.  I reviewed the potential toxicities associated with the R-EPOCH regimen including the chance for hematologic toxicity and neuropathy.  He agrees to proceed.  He will receive Neulasta support following chemotherapy.  He will be  scheduled for office visit and nadir CBC at day 10-12.  40 minutes were spent with the patient today.  The majority of the time was used for counseling and coordination of care.  Betsy Coder, MD  03/01/2017  3:39 PM

## 2017-03-01 NOTE — Telephone Encounter (Signed)
Spoke with pt to confirm that he has had a port-a-cath placed; pt confirmed. Also offered pt to come in for MD visit today at 3pm, pt confirmed. Will schedule accordingly.   Called back to inform pt of lab appt at 2:30pm.

## 2017-03-02 ENCOUNTER — Ambulatory Visit: Payer: BC Managed Care – PPO

## 2017-03-02 ENCOUNTER — Ambulatory Visit: Payer: BC Managed Care – PPO | Admitting: Oncology

## 2017-03-02 ENCOUNTER — Telehealth: Payer: Self-pay | Admitting: *Deleted

## 2017-03-02 ENCOUNTER — Inpatient Hospital Stay (HOSPITAL_COMMUNITY)
Admission: AD | Admit: 2017-03-02 | Discharge: 2017-03-06 | DRG: 847 | Disposition: A | Discharge status: Home / Self Care | Payer: BC Managed Care – PPO | Source: Other Acute Inpatient Hospital | Attending: Oncology | Admitting: Oncology

## 2017-03-02 ENCOUNTER — Encounter (HOSPITAL_COMMUNITY): Payer: Self-pay | Admitting: *Deleted

## 2017-03-02 ENCOUNTER — Other Ambulatory Visit: Payer: BC Managed Care – PPO

## 2017-03-02 DIAGNOSIS — C8338 Diffuse large B-cell lymphoma, lymph nodes of multiple sites: Secondary | ICD-10-CM

## 2017-03-02 DIAGNOSIS — Z7982 Long term (current) use of aspirin: Secondary | ICD-10-CM | POA: Diagnosis not present

## 2017-03-02 DIAGNOSIS — M549 Dorsalgia, unspecified: Secondary | ICD-10-CM | POA: Diagnosis present

## 2017-03-02 DIAGNOSIS — Z5112 Encounter for antineoplastic immunotherapy: Secondary | ICD-10-CM

## 2017-03-02 DIAGNOSIS — Z5111 Encounter for antineoplastic chemotherapy: Secondary | ICD-10-CM

## 2017-03-02 DIAGNOSIS — C8330 Diffuse large B-cell lymphoma, unspecified site: Secondary | ICD-10-CM

## 2017-03-02 DIAGNOSIS — E1140 Type 2 diabetes mellitus with diabetic neuropathy, unspecified: Secondary | ICD-10-CM | POA: Diagnosis present

## 2017-03-02 DIAGNOSIS — C8590 Non-Hodgkin lymphoma, unspecified, unspecified site: Secondary | ICD-10-CM | POA: Diagnosis present

## 2017-03-02 DIAGNOSIS — M79621 Pain in right upper arm: Secondary | ICD-10-CM | POA: Diagnosis present

## 2017-03-02 DIAGNOSIS — K219 Gastro-esophageal reflux disease without esophagitis: Secondary | ICD-10-CM | POA: Diagnosis present

## 2017-03-02 DIAGNOSIS — K5903 Drug induced constipation: Secondary | ICD-10-CM | POA: Diagnosis present

## 2017-03-02 DIAGNOSIS — G4730 Sleep apnea, unspecified: Secondary | ICD-10-CM | POA: Diagnosis present

## 2017-03-02 DIAGNOSIS — T40605A Adverse effect of unspecified narcotics, initial encounter: Secondary | ICD-10-CM | POA: Diagnosis present

## 2017-03-02 DIAGNOSIS — C8332 Diffuse large B-cell lymphoma, intrathoracic lymph nodes: Secondary | ICD-10-CM | POA: Diagnosis present

## 2017-03-02 DIAGNOSIS — Z8249 Family history of ischemic heart disease and other diseases of the circulatory system: Secondary | ICD-10-CM | POA: Diagnosis not present

## 2017-03-02 DIAGNOSIS — G8929 Other chronic pain: Secondary | ICD-10-CM | POA: Diagnosis present

## 2017-03-02 DIAGNOSIS — M109 Gout, unspecified: Secondary | ICD-10-CM | POA: Diagnosis present

## 2017-03-02 DIAGNOSIS — J449 Chronic obstructive pulmonary disease, unspecified: Secondary | ICD-10-CM | POA: Diagnosis present

## 2017-03-02 DIAGNOSIS — Z85828 Personal history of other malignant neoplasm of skin: Secondary | ICD-10-CM | POA: Diagnosis not present

## 2017-03-02 DIAGNOSIS — L299 Pruritus, unspecified: Secondary | ICD-10-CM | POA: Diagnosis present

## 2017-03-02 DIAGNOSIS — G893 Neoplasm related pain (acute) (chronic): Secondary | ICD-10-CM | POA: Diagnosis not present

## 2017-03-02 DIAGNOSIS — R079 Chest pain, unspecified: Secondary | ICD-10-CM | POA: Diagnosis present

## 2017-03-02 DIAGNOSIS — Z87891 Personal history of nicotine dependence: Secondary | ICD-10-CM

## 2017-03-02 DIAGNOSIS — Z825 Family history of asthma and other chronic lower respiratory diseases: Secondary | ICD-10-CM

## 2017-03-02 DIAGNOSIS — I10 Essential (primary) hypertension: Secondary | ICD-10-CM | POA: Diagnosis present

## 2017-03-02 DIAGNOSIS — M545 Low back pain: Secondary | ICD-10-CM | POA: Diagnosis not present

## 2017-03-02 DIAGNOSIS — K5900 Constipation, unspecified: Secondary | ICD-10-CM

## 2017-03-02 LAB — GLUCOSE, CAPILLARY
GLUCOSE-CAPILLARY: 215 mg/dL — AB (ref 65–99)
Glucose-Capillary: 120 mg/dL — ABNORMAL HIGH (ref 65–99)

## 2017-03-02 MED ORDER — ACETAMINOPHEN 500 MG PO TABS
1000.0000 mg | ORAL_TABLET | Freq: Two times a day (BID) | ORAL | Status: DC | PRN
  Administered 2017-03-04: 1000 mg via ORAL
  Filled 2017-03-02: qty 2

## 2017-03-02 MED ORDER — SODIUM CHLORIDE 0.9 % IV SOLN
INTRAVENOUS | Status: DC
  Administered 2017-03-02 – 2017-03-06 (×5): via INTRAVENOUS

## 2017-03-02 MED ORDER — POLYETHYLENE GLYCOL 3350 17 G PO PACK
17.0000 g | PACK | Freq: Every day | ORAL | Status: DC | PRN
  Administered 2017-03-03 – 2017-03-04 (×2): 17 g via ORAL
  Filled 2017-03-02 (×2): qty 1

## 2017-03-02 MED ORDER — ACETAMINOPHEN 325 MG PO TABS
650.0000 mg | ORAL_TABLET | Freq: Once | ORAL | Status: DC
  Filled 2017-03-02: qty 2

## 2017-03-02 MED ORDER — TIOTROPIUM BROMIDE MONOHYDRATE 18 MCG IN CAPS
18.0000 ug | ORAL_CAPSULE | Freq: Every day | RESPIRATORY_TRACT | Status: DC
  Administered 2017-03-03 – 2017-03-05 (×3): 18 ug via RESPIRATORY_TRACT
  Filled 2017-03-02: qty 5

## 2017-03-02 MED ORDER — ALBUTEROL SULFATE (2.5 MG/3ML) 0.083% IN NEBU: 2.5000 mg | INHALATION_SOLUTION | Freq: Two times a day (BID) | RESPIRATORY_TRACT | Status: DC | PRN

## 2017-03-02 MED ORDER — SENNA 8.6 MG PO TABS
1.0000 | ORAL_TABLET | Freq: Two times a day (BID) | ORAL | Status: DC
  Administered 2017-03-02 – 2017-03-04 (×4): 8.6 mg via ORAL
  Filled 2017-03-02 (×4): qty 1

## 2017-03-02 MED ORDER — HEPARIN SOD (PORK) LOCK FLUSH 100 UNIT/ML IV SOLN: 250.0000 [IU] | Freq: Once | INTRAVENOUS | Status: DC | PRN

## 2017-03-02 MED ORDER — HOT PACK MISC ONCOLOGY
1.0000 | Freq: Once | Status: DC | PRN
  Filled 2017-03-02: qty 1

## 2017-03-02 MED ORDER — SODIUM CHLORIDE 0.9 % IV SOLN
Freq: Once | INTRAVENOUS | Status: AC
  Administered 2017-03-02: 8 mg via INTRAVENOUS
  Filled 2017-03-02: qty 4

## 2017-03-02 MED ORDER — OXYCODONE HCL 5 MG PO TABS
5.0000 mg | ORAL_TABLET | ORAL | Status: DC | PRN
  Administered 2017-03-02 – 2017-03-03 (×2): 5 mg via ORAL
  Filled 2017-03-02 (×2): qty 1

## 2017-03-02 MED ORDER — CELECOXIB 200 MG PO CAPS
200.0000 mg | ORAL_CAPSULE | Freq: Two times a day (BID) | ORAL | Status: DC
  Administered 2017-03-02 – 2017-03-06 (×8): 200 mg via ORAL
  Filled 2017-03-02 (×11): qty 1

## 2017-03-02 MED ORDER — PANTOPRAZOLE SODIUM 40 MG PO TBEC
40.0000 mg | DELAYED_RELEASE_TABLET | Freq: Every day | ORAL | Status: DC
  Administered 2017-03-03 – 2017-03-06 (×4): 40 mg via ORAL
  Filled 2017-03-02 (×4): qty 1

## 2017-03-02 MED ORDER — PROMETHAZINE HCL 25 MG PO TABS: 12.5000 mg | ORAL_TABLET | Freq: Four times a day (QID) | ORAL | Status: DC | PRN

## 2017-03-02 MED ORDER — VINCRISTINE SULFATE CHEMO INJECTION 1 MG/ML
Freq: Once | INTRAVENOUS | Status: AC
  Administered 2017-03-02: 20:00:00 via INTRAVENOUS
  Filled 2017-03-02: qty 12

## 2017-03-02 MED ORDER — SODIUM CHLORIDE 0.9 % IV SOLN
375.0000 mg/m2 | Freq: Once | INTRAVENOUS | Status: AC
  Administered 2017-03-02: 900 mg via INTRAVENOUS
  Filled 2017-03-02: qty 50

## 2017-03-02 MED ORDER — PREDNISONE 50 MG PO TABS
75.0000 mg | ORAL_TABLET | Freq: Two times a day (BID) | ORAL | Status: AC
  Administered 2017-03-02 – 2017-03-06 (×10): 75 mg via ORAL
  Filled 2017-03-02 (×9): qty 1

## 2017-03-02 MED ORDER — SODIUM CHLORIDE 0.9 % IV SOLN: INTRAVENOUS | Status: DC

## 2017-03-02 MED ORDER — SODIUM CHLORIDE 0.9% FLUSH: 3.0000 mL | INTRAVENOUS | Status: DC | PRN

## 2017-03-02 MED ORDER — SODIUM CHLORIDE 0.9 % IV SOLN: Freq: Once | INTRAVENOUS | Status: DC

## 2017-03-02 MED ORDER — ASPIRIN EC 81 MG PO TBEC
81.0000 mg | DELAYED_RELEASE_TABLET | Freq: Every day | ORAL | Status: DC
  Administered 2017-03-03 – 2017-03-06 (×4): 81 mg via ORAL
  Filled 2017-03-02 (×4): qty 1

## 2017-03-02 MED ORDER — PROCHLORPERAZINE MALEATE 10 MG PO TABS: 10.0000 mg | ORAL_TABLET | Freq: Four times a day (QID) | ORAL | Status: DC | PRN

## 2017-03-02 MED ORDER — COLD PACK MISC ONCOLOGY
1.0000 | Freq: Once | Status: DC | PRN
  Filled 2017-03-02: qty 1

## 2017-03-02 MED ORDER — MOMETASONE FURO-FORMOTEROL FUM 100-5 MCG/ACT IN AERO
2.0000 | INHALATION_SPRAY | Freq: Two times a day (BID) | RESPIRATORY_TRACT | Status: DC
  Administered 2017-03-02 – 2017-03-06 (×9): 2 via RESPIRATORY_TRACT
  Filled 2017-03-02: qty 8.8

## 2017-03-02 MED ORDER — ALTEPLASE 2 MG IJ SOLR
2.0000 mg | Freq: Once | INTRAMUSCULAR | Status: DC | PRN
  Filled 2017-03-02: qty 2

## 2017-03-02 MED ORDER — HEPARIN SOD (PORK) LOCK FLUSH 100 UNIT/ML IV SOLN
500.0000 [IU] | Freq: Once | INTRAVENOUS | Status: AC | PRN
  Administered 2017-03-06: 500 [IU]
  Filled 2017-03-02: qty 5

## 2017-03-02 MED ORDER — SODIUM CHLORIDE 0.9% FLUSH: 10.0000 mL | INTRAVENOUS | Status: DC | PRN

## 2017-03-02 MED ORDER — METFORMIN HCL 500 MG PO TABS
1000.0000 mg | ORAL_TABLET | Freq: Two times a day (BID) | ORAL | Status: DC
  Administered 2017-03-02 – 2017-03-06 (×9): 1000 mg via ORAL
  Filled 2017-03-02 (×11): qty 2

## 2017-03-02 MED ORDER — LOSARTAN POTASSIUM 50 MG PO TABS
100.0000 mg | ORAL_TABLET | Freq: Every day | ORAL | Status: DC
  Administered 2017-03-02: 100 mg via ORAL
  Administered 2017-03-03: 50 mg via ORAL
  Administered 2017-03-04 – 2017-03-06 (×3): 100 mg via ORAL
  Filled 2017-03-02 (×6): qty 2

## 2017-03-02 MED ORDER — DIPHENHYDRAMINE HCL 50 MG PO CAPS
50.0000 mg | ORAL_CAPSULE | Freq: Once | ORAL | Status: DC
  Filled 2017-03-02: qty 1

## 2017-03-02 MED ORDER — GLIMEPIRIDE 2 MG PO TABS
2.0000 mg | ORAL_TABLET | Freq: Two times a day (BID) | ORAL | Status: DC
  Administered 2017-03-02 – 2017-03-06 (×9): 2 mg via ORAL
  Filled 2017-03-02 (×9): qty 1

## 2017-03-02 MED ORDER — CLONAZEPAM 0.5 MG PO TABS
0.5000 mg | ORAL_TABLET | Freq: Every day | ORAL | Status: DC
  Administered 2017-03-02 – 2017-03-05 (×4): 0.5 mg via ORAL
  Filled 2017-03-02 (×4): qty 1

## 2017-03-02 MED ORDER — ALBUTEROL SULFATE (2.5 MG/3ML) 0.083% IN NEBU: 2.5000 mg | INHALATION_SOLUTION | Freq: Every evening | RESPIRATORY_TRACT | Status: DC | PRN

## 2017-03-02 NOTE — Telephone Encounter (Signed)
FYI "Yolanda with [employer] [place] trying to reach office.  This patient's chemotherapy is not authorized."    Second call received by Triage Denman George reports "Receiving voicemail every time I call.  Need to speak with someone today for code for medication authorizations.  His port-a-cath placed 02-27-2017, chemotherapy started 03-01-2017.  These will be denied unless codes received today"    No diagnosis code required.  denied need for diagnosis code requesting J code for each chemotherapy drug administered to patient currently hospitalized receiving R-EPOCH.  [facility] pharmacist assisted.  Rituxan - J9310  Etopioside - I1030  Prednisone - G9469  Vincristine - J9370  Cyclophosphamide - J9070  Doxorubicin - J9000  Neulasta - J2505 (Has been authorized)  4:42 pm Original call lost before providing J code information. No [employer] extension provided.  Reached wrong department or no answer. 4:59 pm "We rare closed.  Return call Mon - Fri from 8:00 am through 5:00 pm from automatic messaging despite providing:  NPI of ordering provider  First three letters of pt.'s member ID number Remaining characters skipping first if a letter is fourth character  Patient name  D.O.B. Communicated with inpatient 3W and Pharmacy.  No service support.  Possible return call Monday, 03-05-2017.    Routing call information to chemotherapy authorization specialist, collaborative nurse and provider for any further [facility] communication with [place].C.

## 2017-03-02 NOTE — H&P (Addendum)
Patient History and Physical   Jeffery Wood [id number] [birth year] 62 y.o. 03/02/2017    Patient Identification: 62 year old with non-Hodgkin's lymphoma  HPI: Jeffery Wood is admitted electively for cycle 1 of chemotherapy with R-EPOCH.  He was diagnosed with high-grade B-cell lymphoma when he underwent a mediastinoscopy 02/05/2017.  The lymphoma has a 8: 14 translocation. 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  He completed a first cycle of CHOP-rituximab beginning 02/08/2017.  He received Neulasta 02/12/2017.  Jeffery Wood tolerated the first cycle of chemotherapy well.  There was no evidence of tumor lysis syndrome.  The SVC syndrome resolved.  He reports improvement in dyspnea and right arm pain.  I had discussions with colleagues regarding a switch from R-CHOP to [place].  I discussed the R-EPO CH regimen with Jeffery Wood and his wife.  He is now admitted for cycle 1R-EPOCH.  He underwent placement of a Port-A-Cath 02/27/2017.  PMH:  Past Medical History:  Diagnosis Date  . Arthritis   . Asthma   . Basal cell carcinoma (BCC) of right temple region   . Cancer ([place])    basal cell of right temple; carcinoids  . Complication of anesthesia    hard to wake up after bronchoscopy  . COPD (chronic obstructive pulmonary disease) ([place])   . Diabetes ([place])   . Dyspnea   . GERD (gastroesophageal reflux disease)   . Gout   . Headache   . History of hiatal hernia 05/16/2006   small noted on EGD  . Hypertension   .  High-grade B-cell lymphoma, stage IV  January 2019  . 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  . Sleep apnea    uses mouth piece    Past Surgical History:  Procedure Laterality Date  . BICEPS TENDON REPAIR Left   . CARPAL TUNNEL RELEASE    . COLONOSCOPY    . KNEE ARTHROSCOPY    . LEFT HEART CATHETERIZATION WITH CORONARY ANGIOGRAM N/A 12/07/2010   Procedure: LEFT HEART CATHETERIZATION WITH CORONARY ANGIOGRAM;  Surgeon: Leonie Man, MD;   Location: [facility] CATH LAB;  Service: Cardiovascular;  Laterality: N/A;  . PILONIDAL CYST EXCISION    . PORTACATH PLACEMENT Left 02/27/2017   Procedure: INSERTION PORT-A-CATH;  Surgeon: [name], MD;  Location: [facility] ORS;  Service: General;  Laterality: Left;  . UPPER GASTROINTESTINAL ENDOSCOPY  05/16/2006  . VIDEO BRONCHOSCOPY WITH ENDOBRONCHIAL ULTRASOUND N/A 01/24/2017   Procedure: VIDEO BRONCHOSCOPY WITH ENDOBRONCHIAL ULTRASOUND with TRANSBRONCHIAL BIOPSY;  Surgeon: Grace Isaac, MD;  Location: [place];  Service: Thoracic;  Laterality: N/A;  . VIDEO MEDIASTINOSCOPY N/A 02/05/2017   Procedure: VIDEO MEDIASTINOSCOPY;  Surgeon: Grace Isaac, MD;  Location: [facility];  Service: Thoracic;  Laterality: N/A;    Allergies:  No Known Allergies  Medications:  Medications Prior to Admission  Medication Sig Dispense Refill  . acetaminophen (TYLENOL) 650 MG CR tablet Take 1,300 mg by mouth 2 (two) times daily as needed for pain.     Marland Kitchen albuterol (PROVENTIL) (2.5 MG/3ML) 0.083% nebulizer solution Take 2.5 mg by nebulization at bedtime as needed for wheezing or shortness of breath.    Marland Kitchen aspirin EC 81 MG tablet Take 81 mg by mouth daily.      . betamethasone dipropionate (DIPROLENE) 0.05 % cream Apply 1 application topically daily as needed (for psoriasis).     . celecoxib (CELEBREX) 200 MG capsule Take 200 mg by mouth 2 (two) times daily.    Marland Kitchen  clonazePAM (KLONOPIN) 0.5 MG tablet Take 0.5 mg by mouth at bedtime.      Marland Kitchen etodolac (LODINE) 400 MG tablet Take 400 mg by mouth 3 (three) times daily as needed (gout pain).     . Fluticasone-Salmeterol (ADVAIR) 100-50 MCG/DOSE AEPB Inhale 1 puff into the lungs daily.      Marland Kitchen glimepiride (AMARYL) 4 MG tablet Take 2 mg by mouth 2 (two) times daily before a meal.     . HYDROcodone-acetaminophen (NORCO/VICODIN) 5-325 MG tablet Take 1 tablet by mouth every 6 (six) hours as needed for moderate pain or severe pain. 20 tablet 0  . levalbuterol (XOPENEX HFA) 45  MCG/ACT inhaler Inhale 2 puffs into the lungs daily as needed for wheezing or shortness of breath.     . loratadine (CLARITIN) 10 MG tablet Take 10 mg by mouth daily as needed for allergies.     Marland Kitchen losartan (COZAAR) 100 MG tablet Take 100 mg by mouth at bedtime.     . metFORMIN (GLUCOPHAGE) 1000 MG tablet Take 1,000 mg by mouth 2 (two) times daily with a meal.      . Multiple Vitamins-Minerals (MULTIVITAMINS THER. W/MINERALS) TABS Take 2 tablets by mouth daily.     Marland Kitchen omeprazole (PRILOSEC) 20 MG capsule Take 20 mg by mouth 2 (two) times daily.      Marland Kitchen oxyCODONE (OXY IR/ROXICODONE) 5 MG immediate release tablet Take 1 tablet (5 mg total) by mouth every 4 (four) hours as needed for severe pain. 40 tablet 0  . polyethylene glycol (MIRALAX) packet Take 17 g by mouth daily. (Patient taking differently: Take 17 g by mouth daily as needed for mild constipation or moderate constipation. ) 14 each 0  . prochlorperazine (COMPAZINE) 10 MG tablet Take 1 tablet (10 mg total) by mouth every 6 (six) hours as needed for nausea or vomiting. 30 tablet 0  . senna (SENOKOT) 8.6 MG TABS tablet Take 1 tablet (8.6 mg total) by mouth 2 (two) times daily. 120 each 0  . tiotropium (SPIRIVA) 18 MCG inhalation capsule Place 18 mcg into inhaler and inhale daily.    . traMADol (ULTRAM) 50 MG tablet Take 50 mg by mouth at bedtime as needed for moderate pain.     Marland Kitchen trolamine salicylate (ASPERCREME) 10 % cream Apply 1 application topically 2 (two) times daily as needed for muscle pain.       Social History:    reports that he quit smoking about 29 years ago. His smoking use included cigarettes. He has a 60.00 pack-year smoking history. he has never used smokeless tobacco. He reports that he does not drink alcohol or use drugs.  Family History:  Family History  Problem Relation Age of Onset  . Emphysema Mother   . Asthma Mother   . 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   Physical Exam:  Blood pressure (!) 141/78, pulse 91, temperature 97.9 F (36.6 C), temperature source Oral, resp. rate 18, SpO2 96 %.  HEENT: No thrush Lungs: Clear bilaterally Cardiac: Regular rate and rhytm  Vascular: No leg or arm edema Skin: Mediastinoscopy scar without erythema, fluctuance, drainage, or tenderness  Port-A-Cath: Access needle in place  Lab Results:  Lab Results  Component Value Date   WBC  9.1 03/01/2017   HGB 12.3 (L) 02/07/2017   HCT 40.1 03/01/2017   MCV 90.1 03/01/2017   PLT 441 (H) 03/01/2017   NEUTROABS 6.3 03/01/2017     Impression and Plan:  1. High-grade B-cell non-Hodgkin's lymphoma, molecular studies pending, clinical stage IV, IPI-low intermediate risk, FISH panel revealed an [id number] fusion (MYC/IgH), negative for BCL 6 and BCL-2  Chest x-ray 01/15/2017-medial right upper lobe/perihilar mass.   Chest CT 01/19/2017-mass constricting the superior vena cava, subcarinal adenopathy, right suprahilar/mediastinal mass.   12/26/2018status postbronchoscopy with transbronchial biopsy of level 7node and biopsy of aright upper lobe lung mass by Dr. Servando Snare. There was compression of the right mainstem bronchus. A mass was noted in the right upper lobe. Mediastinal lymph nodes were seen on EBUS. Multiple biopsies of a level 7 node were obtained. Brushings and a biopsy of the right upper lobe mass were obtained. 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 Final pathology was nondiagnostic.   PET scan on 02/02/2017 showed hypermetabolism corresponding to the right sided mediastinal mass,progressive since the CT 01/19/2017. Metastatic disease/lymphoma within the bones, pericardium, stomach, bowel and  abdominopelvic nodal stations.   Brain MRI 02/03/2017 showed no evidence of intracranial metastases or acute abnormality.   1/7/2019status postmediastinoscopy.  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 Molecular studies are pending.  Rituximab 02/08/2017  Cycle 1 CHOP 02/09/2017  Cycle 1R-EPOCH 03/02/2017 2. Chest/upper backand right armpain secondary to #1, improved 3. Exertional dyspnea secondary to #1 and COPD, improved 4. Asthma 5. Diabetes mellitus 6. Gout 7. Remote history of "carcinoid syndrome" 8. Remote history of tobacco use 9. Basal cell carcinoma removed from the right face 10. Chronic low back pain status post epidural steroid injections-followed at a pain management clinic 11. SVC syndrome secondary to #1- resolved following cycle 1 R-CHOP 12. Constipation secondary to narcotic analgesics and potentially lymphoma involving the GI tract-improved.    Jeffery Wood appears well.  He has completed 1 cycle of R-CHOP with clinical improvement.  He is admitted electively for cycle 1R- EPOCH.  I reviewed the potential toxicities associated with this chemotherapy regimen and he agrees to proceed.  He will remain hospitalized to complete the course of chemotherapy.  He will receive outpatient G-CSF following this cycle of chemotherapy.  The plan is to initiate intrathecal methotrexate beginning with the next cycle of chemotherapy.  We will check morning CBGs while he is on steroids.   Betsy Coder, MD  03/02/2017, 4:17 PM

## 2017-03-02 NOTE — Progress Notes (Signed)
  Chemo doses and dilutions verified with 2 RNS

## 2017-03-02 NOTE — Progress Notes (Signed)
Rituxan calculation verified with Aldean Baker RN

## 2017-03-03 ENCOUNTER — Ambulatory Visit: Payer: BC Managed Care – PPO

## 2017-03-03 DIAGNOSIS — K5903 Drug induced constipation: Secondary | ICD-10-CM

## 2017-03-03 DIAGNOSIS — M545 Low back pain: Secondary | ICD-10-CM

## 2017-03-03 DIAGNOSIS — G893 Neoplasm related pain (acute) (chronic): Secondary | ICD-10-CM

## 2017-03-03 LAB — GLUCOSE, CAPILLARY: GLUCOSE-CAPILLARY: 151 mg/dL — AB (ref 65–99)

## 2017-03-03 LAB — LACTATE DEHYDROGENASE: LDH: 100 U/L (ref 98–192)

## 2017-03-03 LAB — URIC ACID: Uric Acid, Serum: 4.6 mg/dL (ref 4.4–7.6)

## 2017-03-03 MED ORDER — POLYETHYLENE GLYCOL 3350 17 G PO PACK
17.0000 g | PACK | Freq: Once | ORAL | Status: AC
  Administered 2017-03-03: 17 g via ORAL
  Filled 2017-03-03: qty 1

## 2017-03-03 MED ORDER — OXYCODONE HCL 5 MG PO TABS
5.0000 mg | ORAL_TABLET | Freq: Four times a day (QID) | ORAL | Status: DC | PRN
  Administered 2017-03-03: 10 mg via ORAL
  Administered 2017-03-04: 5 mg via ORAL
  Filled 2017-03-03: qty 1
  Filled 2017-03-03: qty 2

## 2017-03-03 MED ORDER — VINCRISTINE SULFATE CHEMO INJECTION 1 MG/ML
Freq: Once | INTRAVENOUS | Status: AC
  Administered 2017-03-03: 19:00:00 via INTRAVENOUS
  Filled 2017-03-03 (×2): qty 12

## 2017-03-03 MED ORDER — SODIUM CHLORIDE 0.9 % IV SOLN
Freq: Once | INTRAVENOUS | Status: AC
  Administered 2017-03-03: 8 mg via INTRAVENOUS
  Filled 2017-03-03: qty 4

## 2017-03-03 NOTE — Progress Notes (Addendum)
  Subjective:  Mr. Gadson reports tolerating the rituximab well.  He had pruritus over the scalp and no other sign of an allergic reaction.  The chemotherapy infusion began last night.  No nausea.  He continues to have pain at the lower neck and right arm.  He is up in a chair this morning.  He reports ambulating in the hall.  Objective: Vital signs in last 24 hours: Blood pressure 129/80, pulse 98, temperature 98 F (36.7 C), temperature source Oral, resp. rate 16, SpO2 96 %.  Intake/Output from previous day: 02/01 0701 - 02/02 0700 In: 46 [P.O.:880] Out: -   Physical Exam:  HEENT: No thrush Lungs: Clear bilaterally Cardiac: Regular rate and rhythm Extremities: No arm or leg edema Skin: Mediastinoscopy incision without evidence of infection  Portacath/PICC-without erythema  Lab Results: Recent Labs    03/01/17 1437  WBC 9.1  HCT 40.1  PLT 441*    BMET Recent Labs    03/01/17 1437  NA 140  K 4.0  CL 102  CO2 29  GLUCOSE 82  BUN 11  CALCIUM 9.2  03/03/2017-LDH 100, uric acid 4.6  Medications: I have reviewed the patient's current medications.  Assessment/Plan: 1. High-grade B-cell non-Hodgkin's lymphoma, molecular studies pending, clinical stage IV, IPI-low intermediate risk, FISH panel revealed an [id number] fusion (MYC/IgH), negative for BCL 6 and BCL-2  Chest x-ray 01/15/2017-medial right upper lobe/perihilar mass.   Chest CT 01/19/2017-mass constricting the superior vena cava, subcarinal adenopathy, right suprahilar/mediastinal mass.   12/26/2018status postbronchoscopy with transbronchial biopsy of level 7node and biopsy of aright upper lobe lung mass by Dr. Servando Snare. There was compression of the right mainstem bronchus. A mass was noted in the right upper lobe. Mediastinal lymph nodes were seen on EBUS. Multiple biopsies of a level 7 node were obtained. Brushings and a biopsy of the right upper lobe mass were obtained. There was suspicion of  small cell carcinoma on quick stain. Final pathology was nondiagnostic.   PET scan on 02/02/2017 showed hypermetabolism corresponding to the right sided mediastinal mass,progressive since the CT 01/19/2017. Metastatic disease/lymphoma within the bones, pericardium, stomach, bowel and abdominopelvic nodal stations.   Brain MRI 02/03/2017 showed no evidence of intracranial metastases or acute abnormality.   1/7/2019status postmediastinoscopy.  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 Molecular studies are pending.  Rituximab 02/08/2017  Cycle 1 CHOP 02/09/2017  Cycle 1 R-EPOCH 03/02/2017 2. Chest/upper backand right armpain secondary to #1 3. Exertional dyspnea secondary to #1 and COPD 4. Asthma 5. Diabetes mellitus 6. Gout 7. Remote history of "carcinoid syndrome" 8. Remote history of tobacco use 9. Basal cell carcinoma removed from the right face 10. Chronic low back pain status post epidural steroid injections-followed at a pain management clinic 11. 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 Constipation secondary to narcotic analgesics and potentially lymphoma involving the GI tract-improved.  Mr. Davie tolerated the rituximab well.  He began infusional chemotherapy last night.  He is tolerating the chemotherapy well to date.  He will begin day 2 infusional chemotherapy tonight.  He should be able to complete infusional chemotherapy and received cyclophosphamide on 03/06/2017.  The CBG looks good this morning.  I discussed plans with his wife this morning.      LOS: 1 day   Betsy Coder, MD   03/03/2017, 6:19 AM

## 2017-03-03 NOTE — Progress Notes (Signed)
Chemo therapy dosage and calculations checked and reviewed with Kaitlyn Bodkin RN.  

## 2017-03-04 DIAGNOSIS — Z85828 Personal history of other malignant neoplasm of skin: Secondary | ICD-10-CM

## 2017-03-04 DIAGNOSIS — Z5112 Encounter for antineoplastic immunotherapy: Secondary | ICD-10-CM

## 2017-03-04 DIAGNOSIS — R079 Chest pain, unspecified: Secondary | ICD-10-CM

## 2017-03-04 DIAGNOSIS — C8330 Diffuse large B-cell lymphoma, unspecified site: Secondary | ICD-10-CM

## 2017-03-04 DIAGNOSIS — M79621 Pain in right upper arm: Secondary | ICD-10-CM

## 2017-03-04 DIAGNOSIS — Z5111 Encounter for antineoplastic chemotherapy: Secondary | ICD-10-CM

## 2017-03-04 DIAGNOSIS — J449 Chronic obstructive pulmonary disease, unspecified: Secondary | ICD-10-CM

## 2017-03-04 DIAGNOSIS — M549 Dorsalgia, unspecified: Secondary | ICD-10-CM

## 2017-03-04 DIAGNOSIS — E1140 Type 2 diabetes mellitus with diabetic neuropathy, unspecified: Secondary | ICD-10-CM

## 2017-03-04 DIAGNOSIS — M109 Gout, unspecified: Secondary | ICD-10-CM

## 2017-03-04 DIAGNOSIS — K5900 Constipation, unspecified: Secondary | ICD-10-CM

## 2017-03-04 LAB — GLUCOSE, CAPILLARY: GLUCOSE-CAPILLARY: 135 mg/dL — AB (ref 65–99)

## 2017-03-04 MED ORDER — LACTULOSE 10 GM/15ML PO SOLN
10.0000 g | Freq: Two times a day (BID) | ORAL | Status: DC | PRN
  Administered 2017-03-04: 10 g via ORAL
  Filled 2017-03-04: qty 15

## 2017-03-04 MED ORDER — DOCUSATE SODIUM 100 MG PO CAPS
100.0000 mg | ORAL_CAPSULE | Freq: Two times a day (BID) | ORAL | Status: DC
  Administered 2017-03-04 (×2): 100 mg via ORAL
  Filled 2017-03-04 (×2): qty 1

## 2017-03-04 MED ORDER — SENNA 8.6 MG PO TABS
2.0000 | ORAL_TABLET | Freq: Two times a day (BID) | ORAL | Status: DC
  Administered 2017-03-04 – 2017-03-06 (×4): 17.2 mg via ORAL
  Filled 2017-03-04 (×4): qty 2

## 2017-03-04 MED ORDER — VINCRISTINE SULFATE CHEMO INJECTION 1 MG/ML
Freq: Once | INTRAVENOUS | Status: AC
  Administered 2017-03-04: 18:00:00 via INTRAVENOUS
  Filled 2017-03-04 (×2): qty 12

## 2017-03-04 MED ORDER — POLYETHYLENE GLYCOL 3350 17 G PO PACK
17.0000 g | PACK | Freq: Two times a day (BID) | ORAL | Status: DC
  Administered 2017-03-04 – 2017-03-05 (×3): 17 g via ORAL
  Filled 2017-03-04 (×4): qty 1

## 2017-03-04 MED ORDER — SODIUM CHLORIDE 0.9 % IV SOLN
Freq: Once | INTRAVENOUS | Status: AC
  Administered 2017-03-04: 8 mg via INTRAVENOUS
  Filled 2017-03-04: qty 4

## 2017-03-04 NOTE — Progress Notes (Signed)
IP PROGRESS NOTE  Subjective:  Patient is continuing to receive his systemic infusional chemotherapy without significant difficulties.  Denies any breakthrough nausea or vomiting.  Continues to have occasional pain in the upper thoracic spine which is not new for him.  Pain medication appears to relieve pain adequately, but patient continues to suffer from significant constipation.  Last bowel movement on Friday.  Objective: Vital signs in last 24 hours: Blood pressure (!) 151/75, pulse 94, temperature 97.7 F (36.5 C), temperature source Oral, resp. rate 18, SpO2 95 %.  Intake/Output from previous day: 02/02 0701 - 02/03 0700 In: 1405 [P.O.:1405] Out: -   Physical Exam:  Patient is alert, awake, oriented x3.  No acute distress. HEENT: EOMI, PERRLA, anicteric sclera, moist mucous membranes Lungs: Clear to auscultation bilaterally without expiratory wheezing Cardiac: S1/S2 regular, no murmurs, rubs, gallops Abdomen: Soft, nontender nondistended.  Bowel sounds are present.  No organomegaly Extremities: No lower extremity edema. Portacath: without erythema  Lab Results: Recent Labs    03/01/17 1437  WBC 9.1  HCT 40.1  PLT 441*    BMET Recent Labs    03/01/17 1437  NA 140  K 4.0  CL 102  CO2 29  GLUCOSE 82  BUN 11  CALCIUM 9.2    No results found for: CEA1  Studies/Results: No results found.  Medications: I have reviewed the patient's current medications.  Assessment/Plan: 62 y.o. male with diagnosis of gray zone lymphoma morphologically consistent with diffuse large B cell and negative for BCL-2 & BCL-6 but with MYC/IgH transocation present.  Currently undergoing his second cycle of systemic chemotherapy receiving dose-adjusted R-EPOCH, day 3 of the cycle today.  Tolerating treatment without significant difficulties sofar.  **DLBCL:  -Continue current systemic therapy as scheduled  **Chest/upper backand right armpain: Secondary to the lymphoma.  Reasonably  well controlled with the current opioid medications. -Continue oxycodone 5-10 mg every 6 hours as needed -Continue celecoxib  **Exertional dyspnea secondary to DLBCL, COPD, and Asthma: Stable respiratory status at the present time - Continue Spiriva & Dulera -Continue albuterol nebulizer  **Diabetes mellitus: Diabetic control is expected to worsen due to frequent use of steroid medication. -Continue glimepiride & metformin - If difficult to control, will initiate insulin sliding scale  **Gout: No significant pain at this time.  Patient has already received initial cycle of chemotherapy and allopurinol at this time is unlikely to be helpful.  Uric acid yesterday at 4.6.  **Constipation secondary to narcotic analgesics, antiemetics, and potentially lymphoma involving the GI tract: Patient still has difficulties with having regular bowel movements.  Increased abdominal discomfort today.  Last bowel movement 03/02/17. -Increase senna to 17.2 mg twice daily plus docusate 200 mg twice daily -MiraLAX 17 g twice daily -Add lactulose 10 g twice daily as needed if no bowel movements over 48-hour period prior  **Disposition: Patient is currently continuing his infusions.  Expected discharge on 03/06/17.   LOS: 2 days   Ardath Sax, MD   03/04/2017, 11:20 AM

## 2017-03-05 ENCOUNTER — Telehealth: Payer: Self-pay | Admitting: Oncology

## 2017-03-05 LAB — GLUCOSE, CAPILLARY: Glucose-Capillary: 231 mg/dL — ABNORMAL HIGH (ref 65–99)

## 2017-03-05 MED ORDER — VINCRISTINE SULFATE CHEMO INJECTION 1 MG/ML
Freq: Once | INTRAVENOUS | Status: DC
  Administered 2017-03-05: 19:00:00 via INTRAVENOUS
  Filled 2017-03-05: qty 12

## 2017-03-05 MED ORDER — MAGNESIUM CITRATE PO SOLN
0.5000 | Freq: Once | ORAL | Status: AC
  Administered 2017-03-05: 0.5 via ORAL
  Filled 2017-03-05: qty 296

## 2017-03-05 MED ORDER — VINCRISTINE SULFATE CHEMO INJECTION 1 MG/ML
Freq: Once | INTRAVENOUS | Status: AC
  Administered 2017-03-05: 20:00:00 via INTRAVENOUS

## 2017-03-05 MED ORDER — ONDANSETRON HCL 40 MG/20ML IJ SOLN
Freq: Once | INTRAMUSCULAR | Status: AC
  Administered 2017-03-05: 8 mg via INTRAVENOUS
  Filled 2017-03-05: qty 4

## 2017-03-05 NOTE — Progress Notes (Signed)
  Subjective:  Jeffery Wood reports a brief episode of nausea with breakfast yesterday.  No other nausea.  No new neuropathy symptoms.  He is constipated despite the current laxative regimen.  Objective: Vital signs in last 24 hours: Blood pressure 129/79, pulse 86, temperature (!) 97.5 F (36.4 C), temperature source Oral, resp. rate 16, SpO2 93 %.  Intake/Output from previous day: 02/03 0701 - 02/04 0700 In: 960 [P.O.:960] Out: -   Physical Exam:  HEENT: No thrush Lungs: Clear bilaterally Cardiac: Regular rate and rhythm Extremities: No arm or leg edema Skin: Mediastinoscopy incision without evidence of infection  Portacath/PICC-without erythema  Lab Results: No results for input(s): WBC, HGB, HCT, PLT in the last 72 hours.  BMET No results for input(s): NA, K, CL, CO2, GLUCOSE, BUN, CREATININE, CALCIUM in the last 72 hours.03/03/2017-LDH 100, uric acid 4.6  Medications: I have reviewed the patient's current medications.  Assessment/Plan: 1. High-grade B-cell non-Hodgkin's lymphoma, molecular studies pending, clinical stage IV, IPI-low intermediate risk, FISH panel revealed an [id number] fusion (MYC/IgH), negative for BCL 6 and BCL-2  Chest x-ray 01/15/2017-medial right upper lobe/perihilar mass.   Chest CT 01/19/2017-mass constricting the superior vena cava, subcarinal adenopathy, right suprahilar/mediastinal mass.   12/26/2018status postbronchoscopy with transbronchial biopsy of level 7node and biopsy of aright upper lobe lung mass by Dr. Servando Snare. There was compression of the right mainstem bronchus. A mass was noted in the right upper lobe. Mediastinal lymph nodes were seen on EBUS. Multiple biopsies of a level 7 node were obtained. Brushings and a biopsy of the right upper lobe mass were obtained. 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 Final pathology was nondiagnostic.   PET scan on 02/02/2017 showed hypermetabolism corresponding to the  right sided mediastinal mass,progressive since the CT 01/19/2017. Metastatic disease/lymphoma within the bones, pericardium, stomach, bowel and abdominopelvic nodal stations.   Brain MRI 02/03/2017 showed no evidence of intracranial metastases or acute abnormality.   1/7/2019status postmediastinoscopy.  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 Molecular studies are pending.  Rituximab 02/08/2017  Cycle 1 CHOP 02/09/2017  Cycle 1 R-EPOCH 03/02/2017 2. Chest/upper backand right armpain secondary to #1 3. Exertional dyspnea secondary to #1 and COPD 4. Asthma 5. Diabetes mellitus 6. Gout 7. Remote history of "carcinoid syndrome" 8. Remote history of tobacco use 9. Basal cell carcinoma removed from the right face 10. Chronic low back pain status post epidural steroid injections-followed at a pain management clinic 11. 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 Constipation secondary to narcotic analgesics and potentially lymphoma involving the GI tract-improved.  Jeffery Wood appears stable.  He is completing day 3 of infusional chemotherapy.  The current bag of chemotherapy will be completed at approximately 4:00 today.  The plan is to complete day 4 in the afternoon on 03/06/2017 and then administer Cytoxan.  We will adjust the laxative regimen for constipation.  Outpatient follow-up will be scheduled at the Cancer center for Neulasta on 03/08/2017.     LOS: 3 days   Betsy Coder, MD   03/05/2017, 8:16 AM

## 2017-03-05 NOTE — Telephone Encounter (Signed)
Scheduled appt per 2/4 sch message - Pt to get an updated schedule after discharge.

## 2017-03-06 ENCOUNTER — Encounter (HOSPITAL_COMMUNITY): Payer: Self-pay

## 2017-03-06 ENCOUNTER — Other Ambulatory Visit: Payer: Self-pay

## 2017-03-06 LAB — GLUCOSE, CAPILLARY: Glucose-Capillary: 106 mg/dL — ABNORMAL HIGH (ref 65–99)

## 2017-03-06 MED ORDER — SODIUM CHLORIDE 0.9 % IV SOLN
Freq: Once | INTRAVENOUS | Status: AC
  Administered 2017-03-06: 16 mg via INTRAVENOUS
  Filled 2017-03-06: qty 8

## 2017-03-06 MED ORDER — SODIUM CHLORIDE 0.9 % IV SOLN
750.0000 mg/m2 | Freq: Once | INTRAVENOUS | Status: AC
  Administered 2017-03-06: 1840 mg via INTRAVENOUS
  Filled 2017-03-06: qty 92

## 2017-03-06 NOTE — Discharge Summary (Signed)
Physician Discharge Summary  Patient ID: Jeffery Wood  MRN: [id number] DOB/AGE: Mar 24, 1955 62 y.o.  Admit date: 03/02/2017 Discharge date: 03/06/2017    Discharge Diagnoses:  Active Problems:   Lymphoma, high grade (HCC)   Constipation   Admission for antineoplastic chemotherapy   Maintenance antineoplastic immunotherapy   Discharged Condition: Stable  Discharge Labs: None  Significant Diagnostic Studies: None  Consults: None  Procedures: Chemotherapy  Disposition: 01-Home or Self Care   Allergies as of 03/06/2017   No Known Allergies     Medication List    TAKE these medications   acetaminophen 650 MG CR tablet Commonly known as:  TYLENOL Take 1,300 mg by mouth 2 (two) times daily as needed for pain.   albuterol (2.5 MG/3ML) 0.083% nebulizer solution Commonly known as:  PROVENTIL Take 2.5 mg by nebulization at bedtime as needed for wheezing or shortness of breath.   aspirin EC 81 MG tablet Take 81 mg by mouth daily.   betamethasone dipropionate 0.05 % cream Commonly known as:  DIPROLENE Apply 1 application topically daily as needed (for psoriasis).   celecoxib 200 MG capsule Commonly known as:  CELEBREX Take 200 mg by mouth 2 (two) times daily.   clonazePAM 0.5 MG tablet Commonly known as:  KLONOPIN Take 0.5 mg by mouth at bedtime.   etodolac 400 MG tablet Commonly known as:  LODINE Take 400 mg by mouth 3 (three) times daily as needed (gout pain).   Fluticasone-Salmeterol 100-50 MCG/DOSE Aepb Commonly known as:  ADVAIR Inhale 1 puff into the lungs daily.   glimepiride 4 MG tablet Commonly known as:  AMARYL Take 2 mg by mouth 2 (two) times daily before a meal.   HYDROcodone-acetaminophen 5-325 MG tablet Commonly known as:  NORCO/VICODIN Take 1 tablet by mouth every 6 (six) hours as needed for moderate pain or severe pain.   levalbuterol 45 MCG/ACT inhaler Commonly known as:  XOPENEX HFA Inhale 2 puffs into the lungs daily as needed for  wheezing or shortness of breath.   loratadine 10 MG tablet Commonly known as:  CLARITIN Take 10 mg by mouth daily as needed for allergies.   losartan 100 MG tablet Commonly known as:  COZAAR Take 100 mg by mouth at bedtime.   metFORMIN 1000 MG tablet Commonly known as:  GLUCOPHAGE Take 1,000 mg by mouth 2 (two) times daily with a meal.   multivitamins ther. w/minerals Tabs tablet Take 2 tablets by mouth daily.   omeprazole 20 MG capsule Commonly known as:  PRILOSEC Take 20 mg by mouth 2 (two) times daily.   oxyCODONE 5 MG immediate release tablet Commonly known as:  Oxy IR/ROXICODONE Take 1 tablet (5 mg total) by mouth every 4 (four) hours as needed for severe pain.   polyethylene glycol packet Commonly known as:  MIRALAX Take 17 g by mouth daily. What changed:    when to take this  reasons to take this   prochlorperazine 10 MG tablet Commonly known as:  COMPAZINE Take 1 tablet (10 mg total) by mouth every 6 (six) hours as needed for nausea or vomiting.   senna 8.6 MG Tabs tablet Commonly known as:  SENOKOT Take 1 tablet (8.6 mg total) by mouth 2 (two) times daily.   tiotropium 18 MCG inhalation capsule Commonly known as:  SPIRIVA Place 18 mcg into inhaler and inhale daily.   traMADol 50 MG tablet Commonly known as:  ULTRAM Take 50 mg by mouth at bedtime as needed for moderate pain.  trolamine salicylate 10 % cream Commonly known as:  ASPERCREME Apply 1 application topically 2 (two) times daily as needed for muscle pain.       Follow-up Information    Ladell Pier, MD Follow up.   Specialty:  Oncology Why:  Return to Cancer center 03/08/2017 for growth factor injection Lab visit 03/13/2017 Office visit and rituximab at Cancer center 03/22/2017 Contact information: [place] [id number] [phone]           Hospital Course: Jeffery Wood is a 62 year old man with non-Hodgkin's lymphoma.  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  The lymphoma has an 8:14 translocation.  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  He completed cycle 1 CHOP/Rituxan beginning 02/08/2017.  He received Neulasta 02/12/2017.    He was admitted electively on 03/02/2017 to proceed with cycle 1 of chemotherapy with R-EPOCH.  He completed rituximab on 03/02/2017.  He began infusional chemotherapy with doxorubicin, etoposide, vincristine on 03/02/2017 upon completion of the rituximab. The infusional chemotherapy was continued daily for 4 days. He had mild nausea and some constipation, otherwise the chemotherapy was tolerated well.  He will receive Cytoxan upon completion of the infusional chemotherapy later today.  Following the Cytoxan he will be discharged home.  He will return to the office for Neulasta on 03/08/2017.  He will be seen as an outpatient 03/22/2017.     Signed: Ned Card 03/06/2017, 3:54 PM   Jeffery Wood tolerated the chemotherapy without significant acute toxicity.  He was examined on the morning of 03/06/2017 and appeared stable for discharge to home.  Outpatient follow-up has been arranged at the Cancer center.  Julieanne Manson, MD

## 2017-03-06 NOTE — Discharge Instructions (Signed)
Call for fever, bleeding, or shortness of breath °

## 2017-03-06 NOTE — Progress Notes (Signed)
Chemo ,cytoxan dosage and calculation checked with Lottie Dawson RN.

## 2017-03-07 ENCOUNTER — Other Ambulatory Visit: Payer: Self-pay | Admitting: Oncology

## 2017-03-08 ENCOUNTER — Inpatient Hospital Stay: Payer: BC Managed Care – PPO | Attending: Oncology

## 2017-03-08 VITALS — BP 142/85 | HR 90 | Temp 98.5°F | Resp 20

## 2017-03-08 DIAGNOSIS — Z85828 Personal history of other malignant neoplasm of skin: Secondary | ICD-10-CM | POA: Insufficient documentation

## 2017-03-08 DIAGNOSIS — Z5189 Encounter for other specified aftercare: Secondary | ICD-10-CM | POA: Insufficient documentation

## 2017-03-08 DIAGNOSIS — G893 Neoplasm related pain (acute) (chronic): Secondary | ICD-10-CM | POA: Insufficient documentation

## 2017-03-08 DIAGNOSIS — M545 Low back pain: Secondary | ICD-10-CM | POA: Insufficient documentation

## 2017-03-08 DIAGNOSIS — L539 Erythematous condition, unspecified: Secondary | ICD-10-CM | POA: Diagnosis not present

## 2017-03-08 DIAGNOSIS — K5903 Drug induced constipation: Secondary | ICD-10-CM | POA: Insufficient documentation

## 2017-03-08 DIAGNOSIS — C8338 Diffuse large B-cell lymphoma, lymph nodes of multiple sites: Secondary | ICD-10-CM | POA: Diagnosis not present

## 2017-03-08 DIAGNOSIS — C8330 Diffuse large B-cell lymphoma, unspecified site: Secondary | ICD-10-CM

## 2017-03-08 DIAGNOSIS — Z5112 Encounter for antineoplastic immunotherapy: Secondary | ICD-10-CM | POA: Diagnosis not present

## 2017-03-08 MED ORDER — PEGFILGRASTIM INJECTION 6 MG/0.6ML ~~LOC~~
6.0000 mg | PREFILLED_SYRINGE | Freq: Once | SUBCUTANEOUS | Status: AC
  Administered 2017-03-08: 6 mg via SUBCUTANEOUS

## 2017-03-08 NOTE — Patient Instructions (Signed)
Pegfilgrastim injection What is this medicine? PEGFILGRASTIM (PEG fil gra stim) is a long-acting granulocyte colony-stimulating factor that stimulates the growth of neutrophils, a type of Bambrick blood cell important in the body's fight against infection. It is used to reduce the incidence of fever and infection in patients with certain types of cancer who are receiving chemotherapy that affects the bone marrow, and to increase survival after being exposed to high doses of radiation. This medicine may be used for other purposes; ask your health care provider or pharmacist if you have questions. COMMON BRAND NAME(S): Neulasta What should I tell my health care provider before I take this medicine? They need to know if you have any of these conditions: -kidney disease -latex allergy -ongoing radiation therapy -sickle cell disease -skin reactions to acrylic adhesives (On-Body Injector only) -an unusual or allergic reaction to pegfilgrastim, filgrastim, other medicines, foods, dyes, or preservatives -pregnant or trying to get pregnant -breast-feeding How should I use this medicine? This medicine is for injection under the skin. If you get this medicine at home, you will be taught how to prepare and give the pre-filled syringe or how to use the On-body Injector. Refer to the patient Instructions for Use for detailed instructions. Use exactly as directed. Tell your healthcare provider immediately if you suspect that the On-body Injector may not have performed as intended or if you suspect the use of the On-body Injector resulted in a missed or partial dose. It is important that you put your used needles and syringes in a special sharps container. Do not put them in a trash can. If you do not have a sharps container, call your pharmacist or healthcare provider to get one. Talk to your pediatrician regarding the use of this medicine in children. While this drug may be prescribed for selected conditions,  precautions do apply. Overdosage: If you think you have taken too much of this medicine contact a poison control center or emergency room at once. NOTE: This medicine is only for you. Do not share this medicine with others. What if I miss a dose? It is important not to miss your dose. Call your doctor or health care professional if you miss your dose. If you miss a dose due to an On-body Injector failure or leakage, a new dose should be administered as soon as possible using a single prefilled syringe for manual use. What may interact with this medicine? Interactions have not been studied. Give your health care provider a list of all the medicines, herbs, non-prescription drugs, or dietary supplements you use. Also tell them if you smoke, drink alcohol, or use illegal drugs. Some items may interact with your medicine. This list may not describe all possible interactions. Give your health care provider a list of all the medicines, herbs, non-prescription drugs, or dietary supplements you use. Also tell them if you smoke, drink alcohol, or use illegal drugs. Some items may interact with your medicine. What should I watch for while using this medicine? You may need blood work done while you are taking this medicine. If you are going to need a MRI, CT scan, or other procedure, tell your doctor that you are using this medicine (On-Body Injector only). What side effects may I notice from receiving this medicine? Side effects that you should report to your doctor or health care professional as soon as possible: -allergic reactions like skin rash, itching or hives, swelling of the face, lips, or tongue -dizziness -fever -pain, redness, or irritation at site   where injected -pinpoint red spots on the skin -red or dark-brown urine -shortness of breath or breathing problems -stomach or side pain, or pain at the shoulder -swelling -tiredness -trouble passing urine or change in the amount of urine Side  effects that usually do not require medical attention (report to your doctor or health care professional if they continue or are bothersome): -bone pain -muscle pain This list may not describe all possible side effects. Call your doctor for medical advice about side effects. You may report side effects to FDA at 1-800-FDA-1088. Where should I keep my medicine? Keep out of the reach of children. Store pre-filled syringes in a refrigerator between 2 and 8 degrees C (36 and 46 degrees F). Do not freeze. Keep in carton to protect from light. Throw away this medicine if it is left out of the refrigerator for more than 48 hours. Throw away any unused medicine after the expiration date. NOTE: This sheet is a summary. It may not cover all possible information. If you have questions about this medicine, talk to your doctor, pharmacist, or health care provider.  2018 Elsevier/Gold Standard (2016-01-13 12:58:03)  

## 2017-03-12 ENCOUNTER — Inpatient Hospital Stay (HOSPITAL_BASED_OUTPATIENT_CLINIC_OR_DEPARTMENT_OTHER): Payer: BC Managed Care – PPO | Admitting: Medical

## 2017-03-12 ENCOUNTER — Inpatient Hospital Stay: Payer: BC Managed Care – PPO

## 2017-03-12 ENCOUNTER — Other Ambulatory Visit: Payer: Self-pay

## 2017-03-12 ENCOUNTER — Encounter: Payer: Self-pay | Admitting: Pharmacy Technician

## 2017-03-12 ENCOUNTER — Ambulatory Visit (HOSPITAL_COMMUNITY)
Admission: RE | Admit: 2017-03-12 | Discharge: 2017-03-12 | Disposition: A | Discharge status: Home / Self Care | Payer: BC Managed Care – PPO | Source: Ambulatory Visit | Attending: Medical | Admitting: Medical

## 2017-03-12 ENCOUNTER — Telehealth: Payer: Self-pay

## 2017-03-12 VITALS — BP 122/69 | HR 94 | Temp 98.8°F | Resp 20 | Ht 75.0 in | Wt 245.8 lb

## 2017-03-12 DIAGNOSIS — L0291 Cutaneous abscess, unspecified: Secondary | ICD-10-CM | POA: Diagnosis not present

## 2017-03-12 DIAGNOSIS — Z5112 Encounter for antineoplastic immunotherapy: Secondary | ICD-10-CM | POA: Diagnosis not present

## 2017-03-12 DIAGNOSIS — R079 Chest pain, unspecified: Secondary | ICD-10-CM | POA: Diagnosis not present

## 2017-03-12 DIAGNOSIS — C8330 Diffuse large B-cell lymphoma, unspecified site: Secondary | ICD-10-CM

## 2017-03-12 DIAGNOSIS — M79605 Pain in left leg: Secondary | ICD-10-CM

## 2017-03-12 DIAGNOSIS — Z85820 Personal history of malignant melanoma of skin: Secondary | ICD-10-CM | POA: Diagnosis not present

## 2017-03-12 LAB — CMP (CANCER CENTER ONLY)
ALBUMIN: 3.3 g/dL — AB (ref 3.5–5.0)
ALK PHOS: 85 U/L (ref 40–150)
ALT: 22 U/L (ref 0–55)
ANION GAP: 11 (ref 3–11)
AST: 16 U/L (ref 5–34)
BILIRUBIN TOTAL: 0.6 mg/dL (ref 0.2–1.2)
BUN: 9 mg/dL (ref 7–26)
CALCIUM: 9.5 mg/dL (ref 8.4–10.4)
CO2: 25 mmol/L (ref 22–29)
Chloride: 102 mmol/L (ref 98–109)
Creatinine: 0.66 mg/dL — ABNORMAL LOW (ref 0.70–1.30)
GFR, Est AFR Am: >60 mL/min (ref >60)
GFR, Estimated: >60 mL/min (ref >60)
GLUCOSE: 103 mg/dL (ref 70–140)
Potassium: 4.1 mmol/L (ref 3.5–5.1)
Sodium: 138 mmol/L (ref 136–145)
TOTAL PROTEIN: 6.5 g/dL (ref 6.4–8.3)

## 2017-03-12 LAB — CBC WITH DIFFERENTIAL (CANCER CENTER ONLY)
BASOS PCT: 2 %
Basophils Absolute: 0.1 10*3/uL (ref 0.0–0.1)
Eosinophils Absolute: 0.3 10*3/uL (ref 0.0–0.5)
Eosinophils Relative: 7 %
HEMATOCRIT: 37.9 % — AB (ref 38.4–49.9)
HEMOGLOBIN: 12.6 g/dL — AB (ref 13.0–17.1)
LYMPHS PCT: 20 %
Lymphs Abs: 0.9 10*3/uL (ref 0.9–3.3)
MCH: 28.9 pg (ref 27.2–33.4)
MCHC: 33.3 g/dL (ref 32.0–36.0)
MCV: 86.8 fL (ref 79.3–98.0)
MONOS PCT: 19 %
Monocytes Absolute: 0.8 10*3/uL (ref 0.1–0.9)
NEUTROS ABS: 2.3 10*3/uL (ref 1.5–6.5)
NEUTROS PCT: 52 %
Platelet Count: 240 10*3/uL (ref 140–400)
RBC: 4.37 MIL/uL (ref 4.20–5.82)
RDW: 15.4 % — ABNORMAL HIGH (ref 11.0–14.6)
WBC Count: 4.5 10*3/uL (ref 4.0–10.3)

## 2017-03-12 MED ORDER — MUPIROCIN CALCIUM 2 % EX CREA: 1.0000 "application " | TOPICAL_CREAM | Freq: Three times a day (TID) | CUTANEOUS | 0 refills | Status: DC

## 2017-03-12 NOTE — Telephone Encounter (Signed)
Returned call to pt regarding leg pain. Pt reports that early this am he "woke up with a lot of pain in my left leg". Pt states that he noticed a "wad of vessels" and "I rubbed it and the pain went away". Pt also states that the "wad is gone but it just looks like a bunch of vessels there now". Spoke with Sandi Mealy, PA, pt to come in for doppler and to see Lucianne Lei afterwards. Pt voiced understanding.

## 2017-03-12 NOTE — Progress Notes (Signed)
Left lower extremity venous duplex completed. No evidence of a DVT, superficial thrombosis, or Baker's cyst. Toma Copier, RVS  03/12/2017, 2:07 PM

## 2017-03-12 NOTE — Progress Notes (Signed)
[facility] Progress Note   Jeffery Wood [id number] 08/30/1955 62 y.o.  Jeffery Wood is managed by Dr. Ladell Pier  Actively treated with chemotherapy: yes  Current Therapy: R-EPOCH  Last Treated: 03/02/2017 (cycle 1)  Assessment: Plan:    Chest pain, unspecified type - Plan: EKG 12-Lead, CBC with Differential ([facility] Only), CMP ([place] only)  Left leg pain  Diffuse large B-cell lymphoma, unspecified body region ([place])  Abscess - Plan: mupirocin cream (BACTROBAN) 2 %   Chest pain: A 12-lead EKG was ordered today.  The results returned showing sinus rhythm at 93 bpm with occasional premature supraventricular complexes.  Left leg pain: The patient was referred for a Doppler ultrasound of his left lower extremity which returned showing no evidence of a DVT.  Diffuse large B-cell lymphoma: The patient is status post cycle 1 of R-EPOCH dosed on 03/02/2017.  A CBC and comprehensive metabolic panel were collected today.  The patient is scheduled to follow-up with Dr. Ladell Pier on 03/23/2017 for consideration of his next cycle of chemotherapy.  Abscess: The patient reports having scant purulent discharge along the edge of a surgical incision over the mediastinum where a surgical biopsy was completed.  The patient reports that he is able to express scant purulent material from the site.  He is given a prescription for mupirocin cream to use on the site 3 times daily.  Please see After Visit Summary for patient specific instructions.  Future Appointments  Date Time Provider [place]  03/23/2017  8:30 AM [facility] LAB 4 [facility] None  03/23/2017  9:00 AM Ladell Pier, MD [facility] None  03/23/2017 10:15 AM [facility] PROCEDURE 1 [facility] None    Orders Placed This Encounter  Procedures  . CBC with Differential ([facility] Only)  . CMP ([facility] only)  . EKG 12-Lead       Subjective:   Patient ID:   Jeffery Wood is a 62 y.o. (DOB April 18, 1955) male.  Chief Complaint:  Chief Complaint  Patient presents with  . Leg Pain    left leg    HPI Jeffery Wood is a 62 year old male with a diffuse large B-cell lymphoma. Mr. Radoncic is followed by Dr. Dominica Severin B. Benay Spice and was last seen on 03/01/2017. He was admitted on 03/02/2017 for cycle 1 of R-EPOCH chemotherapy.  He had initially been treated with cycle 1 of R CHOP on 02/08/2017 with Neulasta given on 02/12/2017.  He tolerated that treatment well.  He had SVC syndrome which resolved he had improvement in his dyspnea and previous right arm pain.  He had no tumor lysis.  Patient contacted our office this morning and reported that he awoke having significant left leg pain.  He reported that he noted enlargement in vessels in his posterior leg.  He rubbed the area and stated that his pain and the enlargement in the blood vessels resolved.  He was referred for a Doppler ultrasound with results returning showing no evidence of a DVT, superficial thrombosis, or Baker's cyst.  Mr. Oats reports having occasional dull achy pain in his left arm.  He also reports having some ongoing mid chest discomfort at the incision where a biopsy was completed of his mediastinum.  He is also able to express scant purulent material along the lateral edge of the surgical incision.  He denies diaphoresis.  He has shortness of breath at his baseline and reports fatigue.  Medications: I have reviewed the patient's  current medications.  Allergies: No Known Allergies  Past Medical History:  Diagnosis Date  . Arthritis   . Asthma   . Basal cell carcinoma (BCC) of right temple region   . Cancer ([place])    basal cell of right temple; carcinoids  . Complication of anesthesia    hard to wake up after bronchoscopy  . COPD (chronic obstructive pulmonary disease) ([place])   . Diabetes ([place])   . Dyspnea   . GERD (gastroesophageal reflux disease)   . Gout   . Headache   . History of  hiatal hernia 05/16/2006   small noted on EGD  . Hypertension   . Lung mass   . 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  . Sleep apnea    uses mouth piece    Past Surgical History:  Procedure Laterality Date  . BICEPS TENDON REPAIR Left   . CARPAL TUNNEL RELEASE    . COLONOSCOPY    . KNEE ARTHROSCOPY    . LEFT HEART CATHETERIZATION WITH CORONARY ANGIOGRAM N/A 12/07/2010   Procedure: LEFT HEART CATHETERIZATION WITH CORONARY ANGIOGRAM;  Surgeon: Leonie Man, MD;  [facility];  Service: Cardiovascular;  Laterality: N/A;  . PILONIDAL CYST EXCISION    . PORTACATH PLACEMENT Left 02/27/2017   Procedure: INSERTION PORT-A-CATH;  Surgeon: [name], MD;  Location: [facility] ORS;  Service: General;  Laterality: Left;  . UPPER GASTROINTESTINAL ENDOSCOPY  05/16/2006  . VIDEO BRONCHOSCOPY WITH ENDOBRONCHIAL ULTRASOUND N/A 01/24/2017   Procedure: VIDEO BRONCHOSCOPY WITH ENDOBRONCHIAL ULTRASOUND with TRANSBRONCHIAL BIOPSY;  Surgeon: Grace Isaac, MD;  Location: [facility];  Service: Thoracic;  Laterality: N/A;  . VIDEO MEDIASTINOSCOPY N/A 02/05/2017   Procedure: VIDEO MEDIASTINOSCOPY;  Surgeon: Grace Isaac, MD;  Location: [facility] OR;  Service: Thoracic;  Laterality: N/A;    Family History  Problem Relation Age of Onset  . Emphysema Mother   . Asthma Mother   . Heart disease Father     Social History   Socioeconomic History  . Marital status: Married    Spouse name: Not on file  . Number of children: Not on file  . Years of education: Not on file  . Highest education level: Not on file  Social Needs  . Financial resource strain: Not on file  . Food insecurity - worry: Not on file  . Food insecurity - inability: Not on file  . Transportation needs - medical: Not on file  . Transportation needs - non-medical: Not on file  Occupational History  . Occupation: retired  Tobacco Use  . Smoking status: Former Smoker    Packs/day: 3.00    Years: 20.00    Pack years: 60.00     Types: Cigarettes    Last attempt to quit: 01/31/1988    Years since quitting: 29.1  . Smokeless tobacco: Never Used  Substance and Sexual Activity  . Alcohol use: No  . Drug use: No  . Sexual activity: Not on file  Other Topics Concern  . Not on file  Social History Narrative  . Not on file    Past Medical History, Surgical history, Social history, and Family history were reviewed and updated as appropriate.   Please see review of systems for further details on the patient's review from today.   Review of Systems:  Review of Systems  Constitutional: Positive for fatigue. Negative for chills, diaphoresis and fever.  HENT: Negative for trouble swallowing.   Respiratory: Positive for shortness of breath. Negative  for cough, choking, chest tightness and wheezing.   Cardiovascular: Positive for chest pain and leg swelling. Negative for palpitations.  Gastrointestinal: Negative for constipation, diarrhea, nausea and vomiting.  Skin:       Patient is able to express scant purulent material from the lateral edge of a surgical incision in the mid chest.  Neurological: Negative for headaches.    Objective:   Physical Exam:  BP 122/69 (BP Location: Right Arm, Patient Position: Sitting)   Pulse 94   Temp 98.8 F (37.1 C) (Oral)   Resp 20   Ht 6\' 3"  (1.905 m)   Wt 245 lb 12.8 oz (111.5 kg)   SpO2 97%   BMI 30.72 kg/m  ECOG: 0  Physical Exam  Constitutional: No distress.  HENT:  Head: Normocephalic and atraumatic.  Mouth/Throat: No oropharyngeal exudate.  Cardiovascular: Normal rate, regular rhythm and normal heart sounds. Exam reveals no gallop and no friction rub.  No murmur heard. Pulmonary/Chest: Effort normal and breath sounds normal. No respiratory distress. He has no wheezes. He has no rales.  Musculoskeletal: He exhibits no edema or tenderness.  Neurological: He is alert. Coordination normal.  Skin: He is not diaphoretic.     Multiple superficial veins are noted in  the bilateral lower extremities.    Lab Review:     Component Value Date/Time   NA 138 03/12/2017 1541   K 4.1 03/12/2017 1541   CL 102 03/12/2017 1541   CO2 25 03/12/2017 1541   GLUCOSE 103 03/12/2017 1541   BUN 9 03/12/2017 1541   CREATININE 0.66 (L) 03/12/2017 1541   CALCIUM 9.5 03/12/2017 1541   PROT 6.5 03/12/2017 1541   ALBUMIN 3.3 (L) 03/12/2017 1541   AST 16 03/12/2017 1541   ALT 22 03/12/2017 1541   ALKPHOS 85 03/12/2017 1541   BILITOT 0.6 03/12/2017 1541   GFRNONAA >60 03/12/2017 1541   GFRAA >60 03/12/2017 1541       Component Value Date/Time   WBC 4.5 03/12/2017 1541   WBC 12.3 (H) 02/07/2017 1511   RBC 4.37 03/12/2017 1541   HGB 12.3 (L) 02/07/2017 1511   HCT 37.9 (L) 03/12/2017 1541   PLT 240 03/12/2017 1541   MCV 86.8 03/12/2017 1541   MCH 28.9 03/12/2017 1541   MCHC 33.3 03/12/2017 1541   RDW 15.4 (H) 03/12/2017 1541   LYMPHSABS 0.9 03/12/2017 1541   MONOABS 0.8 03/12/2017 1541   EOSABS 0.3 03/12/2017 1541   BASOSABS 0.1 03/12/2017 1541   -------------------------------     This case was discussed with Dr. Benay Spice. He expressed agreement with my management of this patient.

## 2017-03-12 NOTE — Progress Notes (Signed)
The patient is approved for drug assistance by [employer] for Neulasta. Enrollment is from 03/10/17 - 03/10/18 and is based on insurance denial. Replacement drug will begin on the first DOS 03/02/17.

## 2017-03-13 ENCOUNTER — Other Ambulatory Visit: Payer: BC Managed Care – PPO

## 2017-03-18 ENCOUNTER — Other Ambulatory Visit: Payer: Self-pay | Admitting: Oncology

## 2017-03-19 ENCOUNTER — Telehealth: Payer: Self-pay | Admitting: Medical Oncology

## 2017-03-19 NOTE — Telephone Encounter (Signed)
Asking for plan for pt next chemo -inpt?

## 2017-03-21 ENCOUNTER — Telehealth: Payer: Self-pay | Admitting: *Deleted

## 2017-03-21 NOTE — Telephone Encounter (Signed)
Spoke with pt regarding plan for chemo this cycle. Informed that he will keep his scheduled appt with [place] and be admitted Saturday for the remainder of his tx, per Dr. Benay Spice. Pt voiced understanding and will convey message to wife.

## 2017-03-21 NOTE — Telephone Encounter (Signed)
"  Is my husband going to be admitting 03-24-2017 to complete chemotherapy after he's there 03-23-2017?  I need to know to plan for work." l

## 2017-03-23 ENCOUNTER — Other Ambulatory Visit: Payer: Self-pay | Admitting: Oncology

## 2017-03-23 ENCOUNTER — Inpatient Hospital Stay: Payer: BC Managed Care – PPO

## 2017-03-23 ENCOUNTER — Inpatient Hospital Stay (HOSPITAL_BASED_OUTPATIENT_CLINIC_OR_DEPARTMENT_OTHER): Payer: BC Managed Care – PPO | Admitting: Oncology

## 2017-03-23 ENCOUNTER — Telehealth: Payer: Self-pay | Admitting: Oncology

## 2017-03-23 VITALS — BP 148/81 | HR 92 | Temp 97.7°F | Resp 17

## 2017-03-23 VITALS — BP 138/73 | HR 92 | Temp 98.5°F | Resp 20 | Ht 75.0 in | Wt 249.3 lb

## 2017-03-23 DIAGNOSIS — Z85828 Personal history of other malignant neoplasm of skin: Secondary | ICD-10-CM

## 2017-03-23 DIAGNOSIS — K5903 Drug induced constipation: Secondary | ICD-10-CM

## 2017-03-23 DIAGNOSIS — C8590 Non-Hodgkin lymphoma, unspecified, unspecified site: Secondary | ICD-10-CM

## 2017-03-23 DIAGNOSIS — G893 Neoplasm related pain (acute) (chronic): Secondary | ICD-10-CM | POA: Diagnosis not present

## 2017-03-23 DIAGNOSIS — L539 Erythematous condition, unspecified: Secondary | ICD-10-CM

## 2017-03-23 DIAGNOSIS — C8338 Diffuse large B-cell lymphoma, lymph nodes of multiple sites: Secondary | ICD-10-CM

## 2017-03-23 DIAGNOSIS — M545 Low back pain: Secondary | ICD-10-CM

## 2017-03-23 DIAGNOSIS — Z5112 Encounter for antineoplastic immunotherapy: Secondary | ICD-10-CM | POA: Diagnosis not present

## 2017-03-23 LAB — CBC WITH DIFFERENTIAL (CANCER CENTER ONLY)
Basophils Absolute: 0.1 10*3/uL (ref 0.0–0.1)
Basophils Relative: 1 %
EOS ABS: 0.2 10*3/uL (ref 0.0–0.5)
Eosinophils Relative: 2 %
HEMATOCRIT: 40.8 % (ref 38.4–49.9)
HEMOGLOBIN: 13.4 g/dL (ref 13.0–17.1)
LYMPHS ABS: 0.9 10*3/uL (ref 0.9–3.3)
Lymphocytes Relative: 9 %
MCH: 29 pg (ref 27.2–33.4)
MCHC: 32.9 g/dL (ref 32.0–36.0)
MCV: 88 fL (ref 79.3–98.0)
MONOS PCT: 11 %
Monocytes Absolute: 1.1 10*3/uL — ABNORMAL HIGH (ref 0.1–0.9)
NEUTROS ABS: 7.8 10*3/uL — AB (ref 1.5–6.5)
NEUTROS PCT: 77 %
Platelet Count: 269 10*3/uL (ref 140–400)
RBC: 4.63 MIL/uL (ref 4.20–5.82)
RDW: 16.3 % — ABNORMAL HIGH (ref 11.0–14.6)
WBC: 10.3 10*3/uL (ref 4.0–10.3)

## 2017-03-23 LAB — CMP (CANCER CENTER ONLY)
ALT: 16 U/L (ref 0–55)
ANION GAP: 10 (ref 3–11)
AST: 16 U/L (ref 5–34)
Albumin: 3.2 g/dL — ABNORMAL LOW (ref 3.5–5.0)
Alkaline Phosphatase: 86 U/L (ref 40–150)
BUN: 9 mg/dL (ref 7–26)
CHLORIDE: 103 mmol/L (ref 98–109)
CO2: 26 mmol/L (ref 22–29)
CREATININE: 0.76 mg/dL (ref 0.70–1.30)
Calcium: 9.6 mg/dL (ref 8.4–10.4)
Glucose, Bld: 159 mg/dL — ABNORMAL HIGH (ref 70–140)
POTASSIUM: 4.4 mmol/L (ref 3.5–5.1)
Sodium: 139 mmol/L (ref 136–145)
Total Bilirubin: 0.3 mg/dL (ref 0.2–1.2)
Total Protein: 6.5 g/dL (ref 6.4–8.3)

## 2017-03-23 LAB — LACTATE DEHYDROGENASE: LDH: 137 U/L (ref 125–245)

## 2017-03-23 LAB — URIC ACID: URIC ACID, SERUM: 5.7 mg/dL (ref 2.6–7.4)

## 2017-03-23 MED ORDER — DIPHENHYDRAMINE HCL 25 MG PO CAPS
ORAL_CAPSULE | ORAL | Status: AC
  Filled 2017-03-23: qty 2

## 2017-03-23 MED ORDER — SODIUM CHLORIDE 0.9 % IV SOLN
375.0000 mg/m2 | Freq: Once | INTRAVENOUS | Status: AC
  Administered 2017-03-23: 900 mg via INTRAVENOUS
  Filled 2017-03-23: qty 50

## 2017-03-23 MED ORDER — HEPARIN SOD (PORK) LOCK FLUSH 100 UNIT/ML IV SOLN
500.0000 [IU] | Freq: Once | INTRAVENOUS | Status: AC | PRN
  Administered 2017-03-23: 500 [IU]
  Filled 2017-03-23: qty 5

## 2017-03-23 MED ORDER — ACETAMINOPHEN 325 MG PO TABS
650.0000 mg | ORAL_TABLET | Freq: Once | ORAL | Status: AC
  Administered 2017-03-23: 650 mg via ORAL

## 2017-03-23 MED ORDER — DIPHENHYDRAMINE HCL 25 MG PO CAPS
50.0000 mg | ORAL_CAPSULE | Freq: Once | ORAL | Status: AC
  Administered 2017-03-23: 50 mg via ORAL

## 2017-03-23 MED ORDER — ACETAMINOPHEN 325 MG PO TABS
ORAL_TABLET | ORAL | Status: AC
  Filled 2017-03-23: qty 2

## 2017-03-23 MED ORDER — SODIUM CHLORIDE 0.9 % IV SOLN
Freq: Once | INTRAVENOUS | Status: AC
  Administered 2017-03-23: 10:00:00 via INTRAVENOUS

## 2017-03-23 MED ORDER — LIDOCAINE-PRILOCAINE 2.5-2.5 % EX CREA: TOPICAL_CREAM | CUTANEOUS | 1 refills | Status: DC

## 2017-03-23 MED ORDER — SODIUM CHLORIDE 0.9% FLUSH
10.0000 mL | INTRAVENOUS | Status: DC | PRN
  Administered 2017-03-23: 10 mL
  Filled 2017-03-23: qty 10

## 2017-03-23 NOTE — H&P (Signed)
Patient History and Physical   Jeffery Wood [id number] [birth year] 62 y.o. 03/23/2017    Patient Identification: 62 year old with non-Hodgkin's lymphoma  HPI:  Jeffery Wood was diagnosed with high-grade B-cell lymphoma when he underwent a mediastinoscopy 02/05/2017.  The lymphoma has an 8: 14 translocation.  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  He completed a second cycle of chemotherapy with R-EPOCH beginning 03/02/2017.  He tolerated cycle 2 well.  The SVC syndrome resolved.  His pain and performance status have improved since beginning chemotherapy.  Jeffery Wood began cycle 3 of systemic therapy yesterday with rituximab at the Cancer center.  He is now admitted for [facility].  He will receive intrathecal prophylaxis with methotrexate during this admission.  PMH:  Past Medical History:  Diagnosis Date  . Arthritis   . Asthma   . Basal cell carcinoma (BCC) of right temple region   . Cancer ([place])    basal cell of right temple; carcinoids  . Complication of anesthesia    hard to wake up after bronchoscopy  . COPD (chronic obstructive pulmonary disease) ([place])   . Diabetes ([place])   . Dyspnea   . GERD (gastroesophageal reflux disease)   . Gout   . Headache   . History of hiatal hernia 05/16/2006   small noted on EGD  . Hypertension   .  High-grade B-cell lymphoma-stage IV  January 2019  . 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  . Sleep apnea    uses mouth piece    Past Surgical History:  Procedure Laterality Date  . BICEPS TENDON REPAIR Left   . CARPAL TUNNEL RELEASE    . COLONOSCOPY    . KNEE ARTHROSCOPY    . LEFT HEART CATHETERIZATION WITH CORONARY ANGIOGRAM N/A 12/07/2010   Procedure: LEFT HEART CATHETERIZATION WITH CORONARY ANGIOGRAM;  Surgeon: Leonie Man, MD;  Location: [facility] CATH LAB;  Service: Cardiovascular;  Laterality: N/A;  . PILONIDAL CYST EXCISION    . PORTACATH PLACEMENT Left 02/27/2017   Procedure:  INSERTION PORT-A-CATH;  Surgeon: [name], MD;  Location: [facility] ORS;  Service: General;  Laterality: Left;  . UPPER GASTROINTESTINAL ENDOSCOPY  05/16/2006  . VIDEO BRONCHOSCOPY WITH ENDOBRONCHIAL ULTRASOUND N/A 01/24/2017   Procedure: VIDEO BRONCHOSCOPY WITH ENDOBRONCHIAL ULTRASOUND with TRANSBRONCHIAL BIOPSY;  Surgeon: Grace Isaac, MD;  Location: [facility];  Service: Thoracic;  Laterality: N/A;  . VIDEO MEDIASTINOSCOPY N/A 02/05/2017   Procedure: VIDEO MEDIASTINOSCOPY;  Surgeon: Grace Isaac, MD;  Location: [facility];  Service: Thoracic;  Laterality: N/A;    Allergies:  No Known Allergies  Medications:  See electronic medical record Social History:    reports that he quit smoking about 29 years ago. His smoking use included cigarettes. He has a 60.00 pack-year smoking history. he has never used smokeless tobacco. He reports that he does not drink alcohol or use drugs.  Family History:  Family History  Problem Relation Age of Onset  . Emphysema Mother   . Asthma Mother   . 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   Physical Exam:   HEENT: No thrush or ulcers Lungs: Clear bilaterally, no respiratory distress Cardiac: Regular rate and rhythm Abdomen: No hepatosplenomegaly, nontender, no mass  Vascular: No arm or leg edema  Lymph nodes: No cervical or supraclavicular nodes Neurologic: Alert and oriented, the motor exam appears grossly intact Skin: No rash, mild erythema and induration surrounding the mediastinoscopy incision.  No fluctuance or drainage.  Lab Results:  Lab Results  Component Value Date   WBC 10.3 03/23/2017   HGB 12.3 (L) 02/07/2017   HCT 40.8 03/23/2017   MCV 88.0 03/23/2017   PLT 269 03/23/2017   NEUTROABS 7.8 (H)  03/23/2017     Impression and Plan: 1. High-grade B-cell non-Hodgkin's lymphoma, molecular studies pending, clinical stage IV, IPI-low intermediate risk, FISH panel revealed an 8q24/14q48fsion (MYC/IgH), negative for BCL 6 and BCL-2  Chest x-ray 01/15/2017-medial right upper lobe/perihilar mass.   Chest CT 01/19/2017-mass constricting the superior vena cava, subcarinal adenopathy, right suprahilar/mediastinal mass.   12/26/2018status postbronchoscopy with transbronchial biopsy of level 7node and biopsy of aright upper lobe lung mass by Dr. GServando Snare There was compression of the right mainstem bronchus. A mass was noted in the right upper lobe. Mediastinal lymph nodes were seen on EBUS. Multiple biopsies of a level 7 node were obtained. Brushings and a biopsy of the right upper lobe mass were obtained. 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 Final pathology was nondiagnostic.   PET scan on 02/02/2017 showed hypermetabolism corresponding to the right sided mediastinal mass,progressive since the CT 01/19/2017. Metastatic disease/lymphoma within the bones, pericardium, stomach, bowel and abdominopelvic nodal stations.   Brain MRI 02/03/2017 showed no evidence of intracranial metastases or acute abnormality.   1/7/2019status postmediastinoscopy.  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 Molecular studies are pending.  Rituximab 02/08/2017  Cycle 1 CHOP 02/09/2017  Cycle 1 R-EPOCH 03/02/2017  Cycle 2 R-EPOCH 03/23/2017 2. Chest/upper backand right armpain secondary to #1- resolved 3. Exertional dyspnea secondary to #1 and COPD 4. Asthma 5. Diabetes mellitus 6. Gout 7. Remote history of "carcinoid syndrome" 8. Remote history of tobacco use 9. Basal cell carcinoma removed from the right face 10. Chronic low back pain status post epidural steroid injections-followed at a pain management clinic 11. SVC syndrome  secondary to #1- resolved 12. Constipation secondary to narcotic analgesics and potentially lymphoma involving the GI tract-improved. 13. Erythema/induration at the mediastinoscopy incision site-he has been using Bactroban, plan to begin systemic antibiotics and contact Dr. GServando Snareif the erythema progresses   Mr. Gupta has completed 2 cycles of systemic therapy for treatment of high-grade B-cell lymphoma.  He is now admitted electively for cycle 3 chemotherapy.  He was treated with rituximab at the Cancer center 03/21/2017.  [facility]  He will receive Neulasta as an outpatient next week.  Mr. WRaczkowskihas a significant risk of CNS relapse.  He will receive intrathecal prophylaxis beginning with this cycle of chemotherapy.  He appears stable to begin chemotherapy on 03/24/2017.    GBetsy Coder MD  03/23/2017, 5:01 PM

## 2017-03-23 NOTE — Patient Instructions (Signed)
[facility] Discharge Instructions for Patients Receiving Chemotherapy  Today you received the following chemotherapy agents Rituximab (Rituxan).  To help prevent nausea and vomiting after your treatment, we encourage you to take your nausea medication. If you develop nausea and vomiting that is not controlled by your nausea medication, call the clinic.   BELOW ARE SYMPTOMS THAT SHOULD BE REPORTED IMMEDIATELY:  *FEVER GREATER THAN 100.5 F  *CHILLS WITH OR WITHOUT FEVER  NAUSEA AND VOMITING THAT IS NOT CONTROLLED WITH YOUR NAUSEA MEDICATION  *UNUSUAL SHORTNESS OF BREATH  *UNUSUAL BRUISING OR BLEEDING  TENDERNESS IN MOUTH AND THROAT WITH OR WITHOUT PRESENCE OF ULCERS  *URINARY PROBLEMS  *BOWEL PROBLEMS  UNUSUAL RASH Items with * indicate a potential emergency and should be followed up as soon as possible.  Feel free to call the clinic should you have any questions or concerns. The clinic phone number is [phone].  Please show the [facility] at check-in to the Emergency Department and triage nurse.

## 2017-03-23 NOTE — Telephone Encounter (Signed)
Scheduled appt per 2/22 los - Patient to get an updated schedule in the treatment area.

## 2017-03-23 NOTE — Progress Notes (Signed)
[facility] OFFICE PROGRESS NOTE   Diagnosis: Non-Hodgkin's lymphoma  INTERVAL HISTORY:   Mr. [name] completed a second cycle of chemotherapy with R-EPOCH beginning 03/02/2017.  He tolerated the chemotherapy well.  No allergic reaction during the rituximab infusion.  He continues to have improvement in dyspnea and arm pain.  No swelling.  No change in neuropathy symptoms.  He has chronic back pain that has increased.  He is scheduled for a steroid injection next month.  He reports persistent erythema and "pus "at the mediastinoscopy incision.  He is using Bactroban ointment.  Good appetite.  He is having bowel movements.  Objective:   Vital signs in last 24 hours:  Blood pressure 138/73, pulse 92, temperature 98.5 F (36.9 C), temperature source Oral, resp. rate 20, height 6' 3"  (1.905 m), weight 249 lb 4.8 oz (113.1 kg), SpO2 98 %.    HEENT: No thrush or ulcers Resp: Lungs clear bilaterally Cardio: Regular rate and rhythm GI: No hepatosplenomegaly Vascular: No arm or leg edema  Skin: Mild erythema and induration at the mediastinoscopy incision, no fluctuance  Portacath/PICC-without erythema  Lab Results:  Lab Results  Component Value Date   WBC 10.3 03/23/2017   HGB 12.3 (L) 02/07/2017   HCT 40.8 03/23/2017   MCV 88.0 03/23/2017   PLT 269 03/23/2017   NEUTROABS 7.8 (H) 03/23/2017    CMP     Component Value Date/Time   NA 138 03/12/2017 1541   K 4.1 03/12/2017 1541   CL 102 03/12/2017 1541   CO2 25 03/12/2017 1541   GLUCOSE 103 03/12/2017 1541   BUN 9 03/12/2017 1541   CREATININE 0.66 (L) 03/12/2017 1541   CALCIUM 9.5 03/12/2017 1541   PROT 6.5 03/12/2017 1541   ALBUMIN 3.3 (L) 03/12/2017 1541   AST 16 03/12/2017 1541   ALT 22 03/12/2017 1541   ALKPHOS 85 03/12/2017 1541   BILITOT 0.6 03/12/2017 1541   GFRNONAA >60 03/12/2017 1541   GFRAA >60 03/12/2017 1541    No results found for: CEA1  Lab Results  Component Value Date   INR 1.05  02/05/2017    Imaging:  No results found.  Medications: I have reviewed the patient's current medications.   Assessment/Plan: 1. High-grade B-cell non-Hodgkin's lymphoma, molecular studies pending, clinical stage IV, IPI-low intermediate risk, FISH panel revealed an 8q24/14q79fsion (MYC/IgH), negative for BCL 6 and BCL-2  Chest x-ray 01/15/2017-medial right upper lobe/perihilar mass.   Chest CT 01/19/2017-mass constricting the superior vena cava, subcarinal adenopathy, right suprahilar/mediastinal mass.   12/26/2018status postbronchoscopy with transbronchial biopsy of level 7node and biopsy of aright upper lobe lung mass by Dr. GServando Snare There was compression of the right mainstem bronchus. A mass was noted in the right upper lobe. Mediastinal lymph nodes were seen on EBUS. Multiple biopsies of a level 7 node were obtained. Brushings and a biopsy of the right upper lobe mass were obtained. 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 Final pathology was nondiagnostic.   PET scan on 02/02/2017 showed hypermetabolism corresponding to the right sided mediastinal mass,progressive since the CT 01/19/2017. Metastatic disease/lymphoma within the bones, pericardium, stomach, bowel and abdominopelvic nodal stations.   Brain MRI 02/03/2017 showed no evidence of intracranial metastases or acute abnormality.   1/7/2019status postmediastinoscopy.  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 Molecular studies are pending.  Rituximab 02/08/2017  Cycle 1 CHOP 02/09/2017  Cycle 1 R-EPOCH 03/02/2017 2. Chest/upper backand right armpain secondary to #1 3. Exertional dyspnea secondary  to #1 and COPD 4. Asthma 5. Diabetes mellitus 6. Gout 7. Remote history of "carcinoid syndrome" 8. Remote history of tobacco use 9. Basal cell carcinoma removed from the right face 10. Chronic low back pain status post epidural steroid  injections-followed at a pain management clinic 11. 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 Constipation secondary to narcotic analgesics and potentially lymphoma involving the GI tract-improved.    Disposition: [name] completed cycle 2 systemic therapy with R-EPOCH beginning 03/02/2017.  He tolerated the chemotherapy well.  His clinical status appears much improved.  He will begin cycle 3 systemic therapy today.  He will receive rituximab at the Cancer center.  He will be admitted on 03/24/2017 for the infusional chemotherapy.  He will receive intrathecal methotrexate prophylaxis with this cycle.  I reviewed the potential toxicities associated with methotrexate including the hematologic toxicity, headache, and arachnoiditis.  He agrees to proceed.  He will undergo a restaging PET after this cycle.  25 minutes were spent with the patient today.  The majority of the time was used for counseling and coordination of care.  Betsy Coder, MD  03/23/2017  9:37 AM

## 2017-03-24 ENCOUNTER — Encounter (HOSPITAL_COMMUNITY): Payer: Self-pay

## 2017-03-24 ENCOUNTER — Other Ambulatory Visit: Payer: Self-pay

## 2017-03-24 ENCOUNTER — Inpatient Hospital Stay (HOSPITAL_COMMUNITY): Payer: BC Managed Care – PPO

## 2017-03-24 ENCOUNTER — Other Ambulatory Visit: Payer: Self-pay | Admitting: Oncology

## 2017-03-24 ENCOUNTER — Inpatient Hospital Stay (HOSPITAL_COMMUNITY)
Admission: AD | Admit: 2017-03-24 | Discharge: 2017-03-28 | DRG: 847 | Disposition: A | Discharge status: Home / Self Care | Payer: BC Managed Care – PPO | Source: Ambulatory Visit | Attending: Oncology | Admitting: Oncology

## 2017-03-24 DIAGNOSIS — C8330 Diffuse large B-cell lymphoma, unspecified site: Secondary | ICD-10-CM

## 2017-03-24 DIAGNOSIS — Z5112 Encounter for antineoplastic immunotherapy: Secondary | ICD-10-CM

## 2017-03-24 DIAGNOSIS — Z85828 Personal history of other malignant neoplasm of skin: Secondary | ICD-10-CM | POA: Diagnosis not present

## 2017-03-24 DIAGNOSIS — R0789 Other chest pain: Secondary | ICD-10-CM | POA: Diagnosis present

## 2017-03-24 DIAGNOSIS — M549 Dorsalgia, unspecified: Secondary | ICD-10-CM | POA: Diagnosis present

## 2017-03-24 DIAGNOSIS — K449 Diaphragmatic hernia without obstruction or gangrene: Secondary | ICD-10-CM | POA: Diagnosis present

## 2017-03-24 DIAGNOSIS — Z87891 Personal history of nicotine dependence: Secondary | ICD-10-CM | POA: Diagnosis not present

## 2017-03-24 DIAGNOSIS — J449 Chronic obstructive pulmonary disease, unspecified: Secondary | ICD-10-CM | POA: Diagnosis present

## 2017-03-24 DIAGNOSIS — G5603 Carpal tunnel syndrome, bilateral upper limbs: Secondary | ICD-10-CM | POA: Diagnosis present

## 2017-03-24 DIAGNOSIS — C8338 Diffuse large B-cell lymphoma, lymph nodes of multiple sites: Secondary | ICD-10-CM | POA: Diagnosis not present

## 2017-03-24 DIAGNOSIS — K219 Gastro-esophageal reflux disease without esophagitis: Secondary | ICD-10-CM | POA: Diagnosis present

## 2017-03-24 DIAGNOSIS — R110 Nausea: Secondary | ICD-10-CM | POA: Diagnosis not present

## 2017-03-24 DIAGNOSIS — T40605A Adverse effect of unspecified narcotics, initial encounter: Secondary | ICD-10-CM | POA: Diagnosis present

## 2017-03-24 DIAGNOSIS — M109 Gout, unspecified: Secondary | ICD-10-CM | POA: Diagnosis present

## 2017-03-24 DIAGNOSIS — D4989 Neoplasm of unspecified behavior of other specified sites: Secondary | ICD-10-CM | POA: Diagnosis present

## 2017-03-24 DIAGNOSIS — G620 Drug-induced polyneuropathy: Secondary | ICD-10-CM | POA: Diagnosis not present

## 2017-03-24 DIAGNOSIS — G629 Polyneuropathy, unspecified: Secondary | ICD-10-CM | POA: Diagnosis present

## 2017-03-24 DIAGNOSIS — Z7984 Long term (current) use of oral hypoglycemic drugs: Secondary | ICD-10-CM | POA: Diagnosis not present

## 2017-03-24 DIAGNOSIS — G8929 Other chronic pain: Secondary | ICD-10-CM | POA: Diagnosis present

## 2017-03-24 DIAGNOSIS — Z7982 Long term (current) use of aspirin: Secondary | ICD-10-CM

## 2017-03-24 DIAGNOSIS — T451X5A Adverse effect of antineoplastic and immunosuppressive drugs, initial encounter: Secondary | ICD-10-CM | POA: Diagnosis present

## 2017-03-24 DIAGNOSIS — Z5111 Encounter for antineoplastic chemotherapy: Principal | ICD-10-CM

## 2017-03-24 DIAGNOSIS — G4733 Obstructive sleep apnea (adult) (pediatric): Secondary | ICD-10-CM | POA: Diagnosis present

## 2017-03-24 DIAGNOSIS — I10 Essential (primary) hypertension: Secondary | ICD-10-CM | POA: Diagnosis present

## 2017-03-24 DIAGNOSIS — M1990 Unspecified osteoarthritis, unspecified site: Secondary | ICD-10-CM | POA: Diagnosis present

## 2017-03-24 DIAGNOSIS — E119 Type 2 diabetes mellitus without complications: Secondary | ICD-10-CM

## 2017-03-24 DIAGNOSIS — G4730 Sleep apnea, unspecified: Secondary | ICD-10-CM | POA: Diagnosis present

## 2017-03-24 DIAGNOSIS — C8590 Non-Hodgkin lymphoma, unspecified, unspecified site: Secondary | ICD-10-CM | POA: Diagnosis present

## 2017-03-24 DIAGNOSIS — K5900 Constipation, unspecified: Secondary | ICD-10-CM | POA: Diagnosis present

## 2017-03-24 DIAGNOSIS — R739 Hyperglycemia, unspecified: Secondary | ICD-10-CM

## 2017-03-24 DIAGNOSIS — K5903 Drug induced constipation: Secondary | ICD-10-CM | POA: Diagnosis present

## 2017-03-24 DIAGNOSIS — C8512 Unspecified B-cell lymphoma, intrathoracic lymph nodes: Secondary | ICD-10-CM | POA: Diagnosis present

## 2017-03-24 MED ORDER — ACETAMINOPHEN 325 MG PO TABS
650.0000 mg | ORAL_TABLET | Freq: Four times a day (QID) | ORAL | Status: DC | PRN
  Administered 2017-03-25: 650 mg via ORAL
  Filled 2017-03-24: qty 2

## 2017-03-24 MED ORDER — SODIUM CHLORIDE 0.9% FLUSH: 10.0000 mL | INTRAVENOUS | Status: DC | PRN

## 2017-03-24 MED ORDER — MOMETASONE FURO-FORMOTEROL FUM 100-5 MCG/ACT IN AERO
2.0000 | INHALATION_SPRAY | Freq: Two times a day (BID) | RESPIRATORY_TRACT | Status: DC
  Administered 2017-03-24 – 2017-03-28 (×8): 2 via RESPIRATORY_TRACT
  Filled 2017-03-24: qty 8.8

## 2017-03-24 MED ORDER — HOT PACK MISC ONCOLOGY
1.0000 | Freq: Once | Status: DC | PRN
  Filled 2017-03-24: qty 1

## 2017-03-24 MED ORDER — CELECOXIB 200 MG PO CAPS
200.0000 mg | ORAL_CAPSULE | Freq: Two times a day (BID) | ORAL | Status: DC
  Administered 2017-03-24 – 2017-03-28 (×8): 200 mg via ORAL
  Filled 2017-03-24 (×8): qty 1

## 2017-03-24 MED ORDER — CLONAZEPAM 0.5 MG PO TABS
0.5000 mg | ORAL_TABLET | Freq: Every day | ORAL | Status: DC
  Administered 2017-03-24 – 2017-03-27 (×4): 0.5 mg via ORAL
  Filled 2017-03-24 (×4): qty 1

## 2017-03-24 MED ORDER — COLD PACK MISC ONCOLOGY
1.0000 | Freq: Once | Status: DC | PRN
  Filled 2017-03-24: qty 1

## 2017-03-24 MED ORDER — SENNA 8.6 MG PO TABS
1.0000 | ORAL_TABLET | Freq: Two times a day (BID) | ORAL | Status: DC
  Administered 2017-03-24 – 2017-03-28 (×8): 8.6 mg via ORAL
  Filled 2017-03-24 (×8): qty 1

## 2017-03-24 MED ORDER — SODIUM CHLORIDE 0.9 % IV SOLN
Freq: Once | INTRAVENOUS | Status: AC
  Administered 2017-03-24: 8 mg via INTRAVENOUS
  Filled 2017-03-24: qty 4

## 2017-03-24 MED ORDER — ALTEPLASE 2 MG IJ SOLR
2.0000 mg | Freq: Once | INTRAMUSCULAR | Status: DC | PRN
  Filled 2017-03-24: qty 2

## 2017-03-24 MED ORDER — METFORMIN HCL 500 MG PO TABS
1000.0000 mg | ORAL_TABLET | Freq: Two times a day (BID) | ORAL | Status: DC
  Administered 2017-03-24 – 2017-03-28 (×8): 1000 mg via ORAL
  Filled 2017-03-24 (×9): qty 2

## 2017-03-24 MED ORDER — SODIUM CHLORIDE 0.9% FLUSH: 3.0000 mL | INTRAVENOUS | Status: DC | PRN

## 2017-03-24 MED ORDER — LIDOCAINE-PRILOCAINE 2.5-2.5 % EX CREA: TOPICAL_CREAM | Freq: Once | CUTANEOUS | Status: DC

## 2017-03-24 MED ORDER — LOSARTAN POTASSIUM 50 MG PO TABS
100.0000 mg | ORAL_TABLET | Freq: Every day | ORAL | Status: DC
  Administered 2017-03-24 – 2017-03-27 (×4): 100 mg via ORAL
  Filled 2017-03-24 (×4): qty 2

## 2017-03-24 MED ORDER — SODIUM CHLORIDE 0.9 % IV SOLN
INTRAVENOUS | Status: DC
  Administered 2017-03-24 – 2017-03-28 (×6): via INTRAVENOUS

## 2017-03-24 MED ORDER — LEVALBUTEROL HCL 1.25 MG/0.5ML IN NEBU: 1.2500 mg | INHALATION_SOLUTION | Freq: Every day | RESPIRATORY_TRACT | Status: DC | PRN

## 2017-03-24 MED ORDER — PANTOPRAZOLE SODIUM 40 MG PO TBEC
40.0000 mg | DELAYED_RELEASE_TABLET | Freq: Every day | ORAL | Status: DC
  Administered 2017-03-25 – 2017-03-28 (×4): 40 mg via ORAL
  Filled 2017-03-24 (×4): qty 1

## 2017-03-24 MED ORDER — PROCHLORPERAZINE MALEATE 10 MG PO TABS
10.0000 mg | ORAL_TABLET | Freq: Four times a day (QID) | ORAL | Status: DC | PRN
  Administered 2017-03-26: 10 mg via ORAL
  Filled 2017-03-24: qty 1

## 2017-03-24 MED ORDER — ACETAMINOPHEN 650 MG RE SUPP: 650.0000 mg | Freq: Four times a day (QID) | RECTAL | Status: DC | PRN

## 2017-03-24 MED ORDER — HEPARIN SOD (PORK) LOCK FLUSH 100 UNIT/ML IV SOLN: 250.0000 [IU] | Freq: Once | INTRAVENOUS | Status: DC | PRN

## 2017-03-24 MED ORDER — VINCRISTINE SULFATE CHEMO INJECTION 1 MG/ML
Freq: Once | INTRAVENOUS | Status: AC
  Administered 2017-03-24: 12:00:00 via INTRAVENOUS
  Filled 2017-03-24: qty 12

## 2017-03-24 MED ORDER — TIOTROPIUM BROMIDE MONOHYDRATE 18 MCG IN CAPS
18.0000 ug | ORAL_CAPSULE | Freq: Every day | RESPIRATORY_TRACT | Status: DC
  Administered 2017-03-25 – 2017-03-28 (×4): 18 ug via RESPIRATORY_TRACT
  Filled 2017-03-24: qty 5

## 2017-03-24 MED ORDER — HYDROCODONE-ACETAMINOPHEN 5-325 MG PO TABS
1.0000 | ORAL_TABLET | Freq: Four times a day (QID) | ORAL | Status: DC | PRN
  Administered 2017-03-27: 1 via ORAL
  Filled 2017-03-24: qty 1

## 2017-03-24 MED ORDER — SODIUM CHLORIDE 0.9 % IV SOLN: INTRAVENOUS | Status: DC

## 2017-03-24 MED ORDER — HEPARIN SOD (PORK) LOCK FLUSH 100 UNIT/ML IV SOLN
500.0000 [IU] | Freq: Once | INTRAVENOUS | Status: DC | PRN
  Filled 2017-03-24: qty 5

## 2017-03-24 MED ORDER — GLIMEPIRIDE 2 MG PO TABS
2.0000 mg | ORAL_TABLET | Freq: Two times a day (BID) | ORAL | Status: DC
  Administered 2017-03-24 – 2017-03-28 (×8): 2 mg via ORAL
  Filled 2017-03-24 (×9): qty 1

## 2017-03-24 MED ORDER — POLYETHYLENE GLYCOL 3350 17 G PO PACK: 17.0000 g | PACK | Freq: Every day | ORAL | Status: DC

## 2017-03-24 MED ORDER — ASPIRIN EC 81 MG PO TBEC
81.0000 mg | DELAYED_RELEASE_TABLET | Freq: Every day | ORAL | Status: DC
  Administered 2017-03-25 – 2017-03-28 (×4): 81 mg via ORAL
  Filled 2017-03-24 (×4): qty 1

## 2017-03-24 NOTE — Progress Notes (Signed)
Jeffery Wood   DOB:03-Dec-1955   QH#:[id number]   TWS#:[id number]  Subjective:  [name] received rituximab yesterday in the office without event. He is waiting on premeds for day 2 chemo. Denies h/a, n/v, cough, SOB, pleurisy, or change in bowel or bladder habits; family in room   Objective: middle aged Jeffery Wood man examined in bed Vitals:   03/24/17 0900  BP: (!) 144/78  Pulse: 87  Resp: 16  SpO2: 95%    Body mass index is 31.8 kg/m. No intake or output data in the 24 hours ending 03/24/17 0954   Sclerae unicteric  Oropharynx shows no thrush or other lesions  Lungs no rales or wheezes  Heart regular rate and rhythm  Abdomen soft, +BS  Neuro nonfocal    CBG (last 3)  No results for input(s): GLUCAP in the last 72 hours.   Labs:  Lab Results  Component Value Date   WBC 10.3 03/23/2017   HGB 12.3 (L) 02/07/2017   HCT 40.8 03/23/2017   MCV 88.0 03/23/2017   PLT 269 03/23/2017   NEUTROABS 7.8 (H) 03/23/2017    @LASTCHEMISTRY @  Urine Studies No results for input(s): UHGB, CRYS in the last 72 hours.  Invalid input(s): UACOL, UAPR, USPG, UPH, UTP, UGL, UKET, UBIL, UNIT, UROB, ULEU, UEPI, UWBC, URBC, UBAC, CAST, UCOM, BILUA  Basic Metabolic Panel: Recent Labs  Lab 03/23/17 0848  NA 139  K 4.4  CL 103  CO2 26  GLUCOSE 159*  BUN 9  CREATININE 0.76  CALCIUM 9.6   GFR Estimated Creatinine Clearance: 132.9 mL/min (by C-G formula based on SCr of 0.76 mg/dL). Liver Function Tests: Recent Labs  Lab 03/23/17 0848  AST 16  ALT 16  ALKPHOS 86  BILITOT 0.3  PROT 6.5  ALBUMIN 3.2*   No results for input(s): LIPASE, AMYLASE in the last 168 hours. No results for input(s): AMMONIA in the last 168 hours. Coagulation profile No results for input(s): INR, PROTIME in the last 168 hours.  CBC: Recent Labs  Lab 03/23/17 0848  WBC 10.3  NEUTROABS 7.8*  HCT 40.8  MCV 88.0  PLT 269   Cardiac Enzymes: No results for input(s): CKTOTAL, CKMB, CKMBINDEX, TROPONINI in  the last 168 hours. BNP: Invalid input(s): POCBNP CBG: No results for input(s): GLUCAP in the last 168 hours. D-Dimer No results for input(s): DDIMER in the last 72 hours. Hgb A1c No results for input(s): HGBA1C in the last 72 hours. Lipid Profile No results for input(s): CHOL, HDL, LDLCALC, TRIG, CHOLHDL, LDLDIRECT in the last 72 hours. Thyroid function studies No results for input(s): TSH, T4TOTAL, T3FREE, THYROIDAB in the last 72 hours.  Invalid input(s): FREET3 Anemia work up No results for input(s): VITAMINB12, FOLATE, FERRITIN, TIBC, IRON, RETICCTPCT in the last 72 hours. Microbiology No results found for this or any previous visit (from the past 240 hour(s)).    Studies:  X-ray Chest Pa And Lateral  Result Date: 03/24/2017 CLINICAL DATA:  Non-Hodgkin's lymphoma. History of COPD, asthma, hypertension and diabetes. EXAM: CHEST  2 VIEW COMPARISON:  02/27/2017; 02/12/2017; 01/24/2017; chest CT - 02/02/2017 FINDINGS: Grossly unchanged cardiac silhouette and mediastinal contours with grossly unchanged appearance of known right hilar and mediastinal mass with associated partial atelectasis of the right mid lung. Stable positioning of support apparatus. No new focal airspace opacities. No pleural effusion pneumothorax. No evidence of edema. No acute osseus abnormalities. Stigmata of DISH within the thoracic spine. IMPRESSION: Grossly unchanged right hilar/mediastinal mass and associated right-sided volume loss without  associated superimposed acute cardiopulmonary disease. Electronically Signed   By: Sandi Mariscal M.D.   On: 03/24/2017 09:46    Assessment: 62 y.o.  Jeffery Wood, [place] man with a diagnosis of high-grade B-cell Non-Hodgkin's lymphoma, t(8,14) positive admitted for third cycle of chemotherapy  Plan:  Today is day 2 cycle 3 of R-EPOCH and he will receive doxorubicin, etoposide and vincristine by 24 hour infusion daily x 4, then cytoxan day 5.  Currently stable, no special  needs.  He is a little disappointed the CXR showed no change from prior  Will follow daily  Jeffery Cruel, MD 03/24/2017  9:54 AM Medical Oncology and Hematology [facility] [place], [place] [address] Tel. [phone]    Fax. Jeffery Wood   DOB:1955/08/11   QT#:[id number]   TGY#:[id number]

## 2017-03-25 ENCOUNTER — Other Ambulatory Visit: Payer: Self-pay | Admitting: Oncology

## 2017-03-25 DIAGNOSIS — G620 Drug-induced polyneuropathy: Secondary | ICD-10-CM

## 2017-03-25 LAB — CBC WITH DIFFERENTIAL/PLATELET
Basophils Absolute: 0 10*3/uL (ref 0.0–0.1)
Basophils Relative: 0 %
Eosinophils Absolute: 0 10*3/uL (ref 0.0–0.7)
Eosinophils Relative: 0 %
HEMATOCRIT: 36.9 % — AB (ref 39.0–52.0)
Hemoglobin: 12.3 g/dL — ABNORMAL LOW (ref 13.0–17.0)
LYMPHS ABS: 1.2 10*3/uL (ref 0.7–4.0)
Lymphocytes Relative: 8 %
MCH: 29.6 pg (ref 26.0–34.0)
MCHC: 33.3 g/dL (ref 30.0–36.0)
MCV: 88.9 fL (ref 78.0–100.0)
MONO ABS: 1.7 10*3/uL — AB (ref 0.1–1.0)
MONOS PCT: 11 %
NEUTROS ABS: 12.2 10*3/uL — AB (ref 1.7–7.7)
Neutrophils Relative %: 81 %
Platelets: 373 10*3/uL (ref 150–400)
RBC: 4.15 MIL/uL — ABNORMAL LOW (ref 4.22–5.81)
RDW: 15.5 % (ref 11.5–15.5)
WBC: 15.1 10*3/uL — ABNORMAL HIGH (ref 4.0–10.5)

## 2017-03-25 LAB — COMPREHENSIVE METABOLIC PANEL
ALBUMIN: 3.4 g/dL — AB (ref 3.5–5.0)
ALK PHOS: 73 U/L (ref 38–126)
ALT: 17 U/L (ref 17–63)
ANION GAP: 13 (ref 5–15)
AST: 25 U/L (ref 15–41)
BILIRUBIN TOTAL: 0.4 mg/dL (ref 0.3–1.2)
BUN: 12 mg/dL (ref 6–20)
CALCIUM: 9.2 mg/dL (ref 8.9–10.3)
CO2: 25 mmol/L (ref 22–32)
Chloride: 101 mmol/L (ref 101–111)
Creatinine, Ser: 0.71 mg/dL (ref 0.61–1.24)
GLUCOSE: 163 mg/dL — AB (ref 65–99)
POTASSIUM: 3.3 mmol/L — AB (ref 3.5–5.1)
Sodium: 139 mmol/L (ref 135–145)
Total Protein: 6.4 g/dL — ABNORMAL LOW (ref 6.5–8.1)

## 2017-03-25 MED ORDER — SODIUM CHLORIDE 0.9 % IV SOLN
Freq: Once | INTRAVENOUS | Status: AC
  Administered 2017-03-25: 8 mg via INTRAVENOUS
  Filled 2017-03-25: qty 4

## 2017-03-25 MED ORDER — POLYETHYLENE GLYCOL 3350 17 G PO PACK
17.0000 g | PACK | Freq: Every day | ORAL | Status: DC
  Administered 2017-03-25 – 2017-03-26 (×2): 17 g via ORAL
  Filled 2017-03-25 (×4): qty 1

## 2017-03-25 MED ORDER — VINCRISTINE SULFATE CHEMO INJECTION 1 MG/ML
Freq: Once | INTRAVENOUS | Status: AC
  Administered 2017-03-25: 14:00:00 via INTRAVENOUS
  Filled 2017-03-25 (×2): qty 12

## 2017-03-25 MED ORDER — GABAPENTIN 300 MG PO CAPS
300.0000 mg | ORAL_CAPSULE | Freq: Every day | ORAL | Status: DC
  Administered 2017-03-25 – 2017-03-27 (×3): 300 mg via ORAL
  Filled 2017-03-25 (×3): qty 1

## 2017-03-25 NOTE — Progress Notes (Signed)
Chemotherapy dosage and calculations checked and reviewed with Diana Torres, RN. 

## 2017-03-25 NOTE — Progress Notes (Signed)
Chemotherapy dosage and calculations checked and reviewed with Kim Osborne RN. 

## 2017-03-25 NOTE — Progress Notes (Signed)
Jeffery Wood   DOB:1955/05/10   ZM#:[id number]   TKP#:[id number]  Subjective:  Jeffery Wood continues to tolerate his chemo remarkably well-- no N/V, no h/a, no ST or mouth sores; port working well; ambulates in halls several times a day; he does c/o neuropathy, particularly feet, which were painful last night; has bilateral CTS, he believes (hard to tell if hands involved w neuropathy); he tells me his DM is very well controlled; wife in room  Objective: middle aged Jeffery Wood man examined in reciner Vitals:   03/25/17 0545 03/25/17 0930  BP: 134/78   Pulse: 87   Resp: 16   Temp: 97.7 F (36.5 C)   SpO2: 95% 96%    Body mass index is 31.8 kg/m.  Intake/Output Summary (Last 24 hours) at 03/25/2017 1104 Last data filed at 03/25/2017 0546 Gross per 24 hour  Intake 1629.53 ml  Output 3 ml  Net 1626.53 ml   Sclerae unicteric, EOMs intact Oropharynx clear and moist Lungs no rales or rhonchi Heart regular rate and rhythm Abd soft, nontender, positive bowel sounds Neuro: nonfocal, well oriented, appropriate affect   CBG (last 3)  No results for input(s): GLUCAP in the last 72 hours.   Labs:  Lab Results  Component Value Date   WBC 10.3 03/23/2017   HGB 12.3 (L) 02/07/2017   HCT 40.8 03/23/2017   MCV 88.0 03/23/2017   PLT 269 03/23/2017   NEUTROABS 7.8 (H) 03/23/2017    @LASTCHEMISTRY @  Urine Studies No results for input(s): UHGB, CRYS in the last 72 hours.  Invalid input(s): UACOL, UAPR, USPG, UPH, UTP, UGL, UKET, UBIL, UNIT, UROB, ULEU, UEPI, UWBC, URBC, UBAC, CAST, UCOM, BILUA  Basic Metabolic Panel: Recent Labs  Lab 03/23/17 0848  NA 139  K 4.4  CL 103  CO2 26  GLUCOSE 159*  BUN 9  CREATININE 0.76  CALCIUM 9.6   GFR Estimated Creatinine Clearance: 132.9 mL/min (by C-G formula based on SCr of 0.76 mg/dL). Liver Function Tests: Recent Labs  Lab 03/23/17 0848  AST 16  ALT 16  ALKPHOS 86  BILITOT 0.3  PROT 6.5  ALBUMIN 3.2*   No results for input(s):  LIPASE, AMYLASE in the last 168 hours. No results for input(s): AMMONIA in the last 168 hours. Coagulation profile No results for input(s): INR, PROTIME in the last 168 hours.  CBC: Recent Labs  Lab 03/23/17 0848  WBC 10.3  NEUTROABS 7.8*  HCT 40.8  MCV 88.0  PLT 269   Cardiac Enzymes: No results for input(s): CKTOTAL, CKMB, CKMBINDEX, TROPONINI in the last 168 hours. BNP: Invalid input(s): POCBNP CBG: No results for input(s): GLUCAP in the last 168 hours. D-Dimer No results for input(s): DDIMER in the last 72 hours. Hgb A1c No results for input(s): HGBA1C in the last 72 hours. Lipid Profile No results for input(s): CHOL, HDL, LDLCALC, TRIG, CHOLHDL, LDLDIRECT in the last 72 hours. Thyroid function studies No results for input(s): TSH, T4TOTAL, T3FREE, THYROIDAB in the last 72 hours.  Invalid input(s): FREET3 Anemia work up No results for input(s): VITAMINB12, FOLATE, FERRITIN, TIBC, IRON, RETICCTPCT in the last 72 hours. Microbiology No results found for this or any previous visit (from the past 240 hour(s)).    Studies:  X-ray Chest Pa And Lateral  Result Date: 03/24/2017 CLINICAL DATA:  Non-Hodgkin's lymphoma. History of COPD, asthma, hypertension and diabetes. EXAM: CHEST  2 VIEW COMPARISON:  02/27/2017; 02/12/2017; 01/24/2017; chest CT - 02/02/2017 FINDINGS: Grossly unchanged cardiac silhouette and mediastinal contours with grossly  unchanged appearance of known right hilar and mediastinal mass with associated partial atelectasis of the right mid lung. Stable positioning of support apparatus. No new focal airspace opacities. No pleural effusion pneumothorax. No evidence of edema. No acute osseus abnormalities. Stigmata of DISH within the thoracic spine. IMPRESSION: Grossly unchanged right hilar/mediastinal mass and associated right-sided volume loss without associated superimposed acute cardiopulmonary disease. Electronically Signed   By: Sandi Mariscal M.D.   On: 03/24/2017  09:46    Assessment: 62 y.o.  Jeffery Wood, [place] man with a diagnosis of high-grade B-cell Non-Hodgkin's lymphoma, t(8,14) positive admitted for third cycle of chemotherapy  Plan:  Today is day 3 cycle 3 of R-EPOCH (doxorubicin, etoposide and vincristine by 24 hour infusion daily x 4, then cytoxan day 5)  We discussed neuropathy issues. Dr Benay Spice has already dropped the vincristine dose to 1 mg. I will add neurontin tonight and see if that provides the patient some relief.  He would like to discuss the CXR results with Dr Benay Spice when he returns tomorrow  Overall he is doing remarkably well and we are proceeding with chemo today as scheduled    Chauncey Cruel, MD 03/25/2017  11:04 AM Medical Oncology and Hematology [facility] [place], [place] [address] Tel. [phone]    Fax. [place]   DOB:05/17/1955   [place]#:[id number]   BTD#:[id number]

## 2017-03-26 DIAGNOSIS — K5903 Drug induced constipation: Secondary | ICD-10-CM

## 2017-03-26 MED ORDER — ONDANSETRON HCL 40 MG/20ML IJ SOLN
Freq: Once | INTRAMUSCULAR | Status: AC
  Administered 2017-03-26: 8 mg via INTRAVENOUS
  Filled 2017-03-26: qty 4

## 2017-03-26 MED ORDER — SORBITOL 70 % SOLN: 15.0000 mL | Freq: Every day | Status: DC | PRN

## 2017-03-26 MED ORDER — VINCRISTINE SULFATE CHEMO INJECTION 1 MG/ML
Freq: Once | INTRAVENOUS | Status: AC
  Administered 2017-03-26: 13:00:00 via INTRAVENOUS
  Filled 2017-03-26: qty 12

## 2017-03-26 MED ORDER — PREDNISONE 50 MG PO TABS
75.0000 mg | ORAL_TABLET | Freq: Two times a day (BID) | ORAL | Status: DC
  Administered 2017-03-26 – 2017-03-28 (×5): 75 mg via ORAL
  Filled 2017-03-26 (×5): qty 1

## 2017-03-26 NOTE — Progress Notes (Signed)
Chemo dosages and calculations checked with Kim Osborne RN. 

## 2017-03-26 NOTE — Progress Notes (Signed)
IP PROGRESS NOTE  Subjective:   Jeffery Wood was admitted 03/24/2017 to complete infusional chemotherapy.  He is tolerating the chemotherapy well.  No nausea.  He complains of constipation.  He complains of burning in the feet.  This predated chemotherapy.  Objective: Vital signs in last 24 hours: Blood pressure 135/70, pulse 85, temperature 98.3 F (36.8 C), temperature source Oral, resp. rate 20, height _0  (1.905 m), weight 254 lb 6.4 oz (115.4 kg), SpO2 97 %.  Intake/Output from previous day: 02/24 0701 - 02/25 0700 In: 2107.9 [P.O.:480; I.V.:1243.3; IV Piggyback:384.5] Out: -   Physical Exam:  HEENT: No thrush Lungs: Clear bilaterally Cardiac: Regular rate and rhythm Abdomen: No hepatomegaly, soft Extremities: No leg edema   Portacath/PICC-without erythema  Lab Results: Recent Labs    03/25/17 0830  WBC 15.1*  HGB 12.3*  HCT 36.9*  PLT 373    BMET Recent Labs    03/25/17 0830  NA 139  K 3.3*  CL 101  CO2 25  GLUCOSE 163*  BUN 12  CREATININE 0.71  CALCIUM 9.2     Medications: I have reviewed the patient's current medications.  Assessment/Plan: 1. High-grade B-cell non-Hodgkin's lymphoma, molecular studies pending, clinical stage IV, IPI-low intermediate risk, FISH panel revealed an 8q24/14q104fsion (MYC/IgH), negative for BCL 6 and BCL-2  Chest x-ray 01/15/2017-medial right upper lobe/perihilar mass.   Chest CT 01/19/2017-mass constricting the superior vena cava, subcarinal adenopathy, right suprahilar/mediastinal mass.   12/26/2018status postbronchoscopy with transbronchial biopsy of level 7node and biopsy of aright upper lobe lung mass by Dr. GServando Snare There was compression of the right mainstem bronchus. A mass was noted in the right upper lobe. Mediastinal lymph nodes were seen on EBUS. Multiple biopsies of a level 7 node were obtained. Brushings and a biopsy of the right upper lobe mass were obtained. There was suspicion of small  cell carcinoma on quick stain. Final pathology was nondiagnostic.   PET scan on 02/02/2017 showed hypermetabolism corresponding to the right sided mediastinal mass,progressive since the CT 01/19/2017. Metastatic disease/lymphoma within the bones, pericardium, stomach, bowel and abdominopelvic nodal stations.   Brain MRI 02/03/2017 showed no evidence of intracranial metastases or acute abnormality.   1/7/2019status postmediastinoscopy.  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 Molecular studies are pending.  Rituximab 02/08/2017  Cycle 1 CHOP 02/09/2017  Cycle 1 R-EPOCH 03/02/2017  Cycle 2 R-EPOCH 03/23/2017 2. Chest/upper backand right armpain secondary to #1- resolved 3. Exertional dyspnea secondary to #1 and COPD 4. Asthma 5. Diabetes mellitus 6. Gout 7. Remote history of "carcinoid syndrome" 8. Remote history of tobacco use 9. Basal cell carcinoma removed from the right face 10. Chronic low back pain status post epidural steroid injections-followed at a pain management clinic 11. 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 Constipation secondary to narcotic analgesics and potentially lymphoma involving the GI tract-improved. 13. Erythema/induration at the mediastinoscopy incision site-he has been using Bactroban, plan to begin systemic antibiotics and contact Dr. GServando Snareif the erythema progresses  Jeffery Wood appears stable.  He is tolerating the infusional chemotherapy well.  The plan is to begin day 3 of infusional chemotherapy today.  He will be scheduled for intrathecal methotrexate tomorrow.  The constipation is likely secondary to chemotherapy.  We will continue a bowel regimen.  I reviewed the chest x-ray from 03/24/2017.  The right chest mass appears smaller.    LOS: 2 days   GBetsy Coder MD   03/26/2017, 4:57 PM

## 2017-03-27 ENCOUNTER — Inpatient Hospital Stay (HOSPITAL_COMMUNITY): Payer: BC Managed Care – PPO

## 2017-03-27 ENCOUNTER — Telehealth: Payer: Self-pay

## 2017-03-27 LAB — CSF CELL COUNT WITH DIFFERENTIAL
RBC COUNT CSF: 9 /mm3 — AB
Tube #: 1
WBC, CSF: 3 /mm3 (ref 0–5)

## 2017-03-27 MED ORDER — IOPAMIDOL (ISOVUE-300) INJECTION 61%
INTRAVENOUS | Status: AC
  Filled 2017-03-27: qty 50

## 2017-03-27 MED ORDER — SODIUM CHLORIDE 0.9 % IV SOLN
Freq: Once | INTRAVENOUS | Status: AC
  Administered 2017-03-27: 8 mg via INTRAVENOUS
  Filled 2017-03-27: qty 4

## 2017-03-27 MED ORDER — VINCRISTINE SULFATE CHEMO INJECTION 1 MG/ML
Freq: Once | INTRAVENOUS | Status: AC
  Administered 2017-03-27: 13:00:00 via INTRAVENOUS
  Filled 2017-03-27: qty 12

## 2017-03-27 MED ORDER — SODIUM CHLORIDE 0.9 % IJ SOLN
Freq: Once | INTRAMUSCULAR | Status: AC
  Administered 2017-03-27: 14:00:00 via INTRATHECAL
  Filled 2017-03-27: qty 0.48

## 2017-03-27 MED ORDER — LIDOCAINE HCL 1 % IJ SOLN
INTRAMUSCULAR | Status: AC
  Filled 2017-03-27: qty 20

## 2017-03-27 NOTE — Progress Notes (Signed)
Inpatient Diabetes Program Recommendations  AACE/ADA: New Consensus Statement on Inpatient Glycemic Control (2015)  Target Ranges:  Prepandial:   less than 140 mg/dL      Peak postprandial:   less than 180 mg/dL (1-2 hours)      Critically ill patients:  140 - 180 mg/dL   Results for SCHAWN, BYAS (MRN [id number]) as of 03/27/2017 12:25  Ref. Range 03/25/2017 08:30  Glucose Latest Ref Range: 65 - 99 mg/dL 163 (H)    Admit for Chemo  History: DM2  Home DM Meds: Amaryl 2 mg BID       Metformin 1000 mg BID  Current Insulin Orders: Amaryl 2 mg BID          Metformin 1000 mg BID     MD- Please consider placing orders for CBG checks TID AC  Pt with History of DM and taking home DM Meds in hospital     --Will follow patient during hospitalization--  Wyn Quaker RN, MSN, CDE Diabetes Coordinator Inpatient Glycemic Control Team Team Pager: [phone] (8a-5p)

## 2017-03-27 NOTE — Progress Notes (Signed)
Patient complained of severe pain in his lower extrimities especially with movements, patient was on bedrest post intrathecal methotrexate , able to move his legs , no numbness and able to walk to bathroom, prn pain med given and patient reassured that MD will be notified, Dr Alvy Bimler made aware. Pain was relieved with pain med.

## 2017-03-27 NOTE — Progress Notes (Signed)
IP PROGRESS NOTE  Subjective:   Jeffery Wood reports an episode of nausea yesterday relieved with an antiemetic.  No vomiting.  He remains constipated.  He has burning in the feet when he ambulates.  This was present prior to chemotherapy. Objective: Vital signs in last 24 hours: Blood pressure (!) 164/54, pulse 88, temperature 98.3 F (36.8 C), temperature source Oral, resp. rate 20, height 6' 3" (1.905 m), weight 254 lb 6.4 oz (115.4 kg), SpO2 97 %.  Intake/Output from previous day: 02/25 0701 - 02/26 0700 In: 934.1 [P.O.:480; I.V.:400; IV Piggyback:54.1] Out: -   Physical Exam:  HEENT: No thrush Lungs: Clear bilaterally Cardiac: Regular rate and rhythm Abdomen: No hepatomegaly, soft Extremities: No leg edema   Portacath/PICC-without erythema  Lab Results: Recent Labs    03/25/17 0830  WBC 15.1*  HGB 12.3*  HCT 36.9*  PLT 373    BMET Recent Labs    03/25/17 0830  NA 139  K 3.3*  CL 101  CO2 25  GLUCOSE 163*  BUN 12  CREATININE 0.71  CALCIUM 9.2     Medications: I have reviewed the patient's current medications.  Assessment/Plan: 1. High-grade B-cell non-Hodgkin's lymphoma, molecular studies pending, clinical stage IV, IPI-low intermediate risk, FISH panel revealed an 8q24/14q64fsion (MYC/IgH), negative for BCL 6 and BCL-2  Chest x-ray 01/15/2017-medial right upper lobe/perihilar mass.   Chest CT 01/19/2017-mass constricting the superior vena cava, subcarinal adenopathy, right suprahilar/mediastinal mass.   12/26/2018status postbronchoscopy with transbronchial biopsy of level 7node and biopsy of aright upper lobe lung mass by Dr. GServando Snare There was compression of the right mainstem bronchus. A mass was noted in the right upper lobe. Mediastinal lymph nodes were seen on EBUS. Multiple biopsies of a level 7 node were obtained. Brushings and a biopsy of the right upper lobe mass were obtained. There was suspicion of small cell carcinoma on  quick stain. Final pathology was nondiagnostic.   PET scan on 02/02/2017 showed hypermetabolism corresponding to the right sided mediastinal mass,progressive since the CT 01/19/2017. Metastatic disease/lymphoma within the bones, pericardium, stomach, bowel and abdominopelvic nodal stations.   Brain MRI 02/03/2017 showed no evidence of intracranial metastases or acute abnormality.   1/7/2019status postmediastinoscopy.  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 Molecular studies are pending.  Rituximab 02/08/2017  Cycle 1 CHOP 02/09/2017  Cycle 1 R-EPOCH 03/02/2017  Cycle 2 R-EPOCH 03/23/2017, intrathecal methotrexate 03/27/2017 2. Chest/upper backand right armpain secondary to #1- resolved 3. Exertional dyspnea secondary to #1 and COPD 4. Asthma 5. Diabetes mellitus 6. Gout 7. Remote history of "carcinoid syndrome" 8. Remote history of tobacco use 9. Basal cell carcinoma removed from the right face 10. Chronic low back pain status post epidural steroid injections-followed at a pain management clinic 11. 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 Constipation secondary to narcotic analgesics and potentially lymphoma involving the GI tract-improved. 13. Erythema/induration at the mediastinoscopy incision site-he has been using Bactroban, plan to begin systemic antibiotics and contact Dr. GServando Snareif the erythema progresses  Jeffery Wood appears stable.  He will begin the last day of infusional chemotherapy today.  He will receive intrathecal methotrexate today.  I reviewed the potential toxicities associated with intrathecal methotrexate.  He agrees to proceed. He will complete cyclophosphamide and be discharged home on 03/28/2017.  He will be scheduled for Neulasta at the Cancer center on 03/29/2017.  I reviewed the 03/24/2017 chest x-ray with Mr. WFosbergand his daughter.   LOS: 3 days   GBetsy Coder  MD   03/27/2017, 2:13 PM

## 2017-03-27 NOTE — Progress Notes (Signed)
Chemo dosages and calculations checked with Regina Baldwin RN. 

## 2017-03-27 NOTE — Procedures (Signed)
CLINICAL DATA: [Non-Hodgkin's lymphoma] EXAM: FLUOROSCOPICALLY GUIDED LUMBAR PUNCTURE FOR INTRATHECAL CHEMOTHERAPY   FLUOROSCOPY TIME:  Fluoroscopy Time: [0 minutes, 30 seconds] Radiation Exposure Index (if provided by the fluoroscopic device): [9 mGy]   PROCEDURE: I discussed the risks (including hemorrhage, infection, headache, reaction to the chemotherapy agent, and nerve damage, among others), benefits, and alternatives to fluoroscopically guided lumbar puncture with the patient.  We specifically discussed the high technical likelihood of success of the procedure. The patient understood and elected to undergo the procedure.   I had previously reviewed the patient's PET-CT, and most of the lumbar levels looked reasonable for access except for L4-5 where there is clearly considerable central narrowing of the thecal sac which might make lumbar puncture at this level difficult .   Standard time-out was employed. Following sterile skin prep and local anesthetic administration consisting of 1 percent lidocaine, a 22 gauge spinal needle was advanced without difficulty into the thecal sac at the at the L5-S1 level under fluoroscopic guidance. Clear CSF was returned.  Opening pressure was [not obtained].   4 cc of clear CSF was collected. Subsequently, and with free flow of CSF observed, 12 mg of methotrexate with 50 mg of hydrocortisone in 2 cc of normal saline was slowly administered intrathecally.  The injection went smoothly.  The needle was subsequently removed and the skin cleansed and bandaged. No immediate complications were observed.     IMPRESSION:  1. Technically successful first intrathecal methotrexate administration at the L5-S1 level without observed complication.

## 2017-03-27 NOTE — Telephone Encounter (Signed)
Spoke with Legrand Rams, RN on [facility] to ask for bed for chemo on 3/15 for patient. Voiced understanding and "put it on the book" per [place].

## 2017-03-28 LAB — BASIC METABOLIC PANEL
ANION GAP: 7 (ref 5–15)
BUN: 20 mg/dL (ref 6–20)
CHLORIDE: 103 mmol/L (ref 101–111)
CO2: 29 mmol/L (ref 22–32)
CREATININE: 0.61 mg/dL (ref 0.61–1.24)
Calcium: 8.9 mg/dL (ref 8.9–10.3)
GFR calc non Af Amer: >60 mL/min (ref >60)
GLUCOSE: 133 mg/dL — AB (ref 65–99)
Potassium: 3.9 mmol/L (ref 3.5–5.1)
Sodium: 139 mmol/L (ref 135–145)

## 2017-03-28 MED ORDER — ONDANSETRON HCL 40 MG/20ML IJ SOLN
Freq: Once | INTRAMUSCULAR | Status: AC
  Administered 2017-03-28: 16 mg via INTRAVENOUS
  Filled 2017-03-28: qty 8

## 2017-03-28 MED ORDER — SODIUM CHLORIDE 0.9 % IV SOLN
750.0000 mg/m2 | Freq: Once | INTRAVENOUS | Status: AC
  Administered 2017-03-28: 1840 mg via INTRAVENOUS
  Filled 2017-03-28: qty 92

## 2017-03-28 NOTE — Discharge Instructions (Signed)
Call for fever, bleeding, or shortness of breath °

## 2017-03-28 NOTE — Progress Notes (Signed)
Pt discharged home with spouse in stable condition. Discharge instructions given. Pt verbalized understanding. No immediate questions or concerns at this time.  

## 2017-03-28 NOTE — Discharge Summary (Signed)
Physician Discharge Summary  Patient ID: Jeffery Wood @ATTENDINGNPI @ MRN: [id number] DOB/AGE: 62-Feb-1957 62 y.o.  Admit date: 03/24/2017 Discharge date: 03/28/2017  Discharge Diagnoses:  1.  Non-Hodgkin's lymphoma, admitted 03/24/2017 for cycle 3 systemic therapy with EPOCH 2.  Diabetes 3.  Chronic back pain 4.  Gout  Discharged Condition: Stable  Discharge Labs: Potassium 3.9, creatinine 0.61  Significant Diagnostic Studies: Chest x-ray  Consults: Radiology  Procedures:  1.  Infusional chemotherapy 2.  Intrathecal chemotherapy  Disposition: 01-Home or Self Care   Allergies as of 03/28/2017   No Known Allergies     Medication List    TAKE these medications   acetaminophen 650 MG CR tablet Commonly known as:  TYLENOL Take 1,300 mg by mouth 2 (two) times daily as needed for pain.   albuterol (2.5 MG/3ML) 0.083% nebulizer solution Commonly known as:  PROVENTIL Take 2.5 mg by nebulization at bedtime as needed for wheezing or shortness of breath.   aspirin EC 81 MG tablet Take 81 mg by mouth daily.   betamethasone dipropionate 0.05 % cream Commonly known as:  DIPROLENE Apply 1 application topically daily as needed (for psoriasis).   celecoxib 200 MG capsule Commonly known as:  CELEBREX Take 200 mg by mouth 2 (two) times daily.   clonazePAM 0.5 MG tablet Commonly known as:  KLONOPIN Take 0.5 mg by mouth at bedtime.   etodolac 400 MG tablet Commonly known as:  LODINE Take 400 mg by mouth 3 (three) times daily as needed (gout pain).   Fluticasone-Salmeterol 100-50 MCG/DOSE Aepb Commonly known as:  ADVAIR Inhale 1 puff into the lungs daily.   glimepiride 2 MG tablet Commonly known as:  AMARYL Take 2 mg by mouth 2 (two) times daily before a meal.   HYDROcodone-acetaminophen 5-325 MG tablet Commonly known as:  NORCO/VICODIN Take 1 tablet by mouth every 6 (six) hours as needed for moderate pain or severe pain.   levalbuterol 45 MCG/ACT inhaler Commonly  known as:  XOPENEX HFA Inhale 2 puffs into the lungs daily as needed for wheezing or shortness of breath.   lidocaine-prilocaine cream Commonly known as:  EMLA Apply to port site one hour prior to use. Do not rub in. Cover with plastic.   loratadine 10 MG tablet Commonly known as:  CLARITIN Take 10 mg by mouth daily as needed for allergies.   losartan 100 MG tablet Commonly known as:  COZAAR Take 100 mg by mouth at bedtime.   metFORMIN 1000 MG tablet Commonly known as:  GLUCOPHAGE Take 1,000 mg by mouth 2 (two) times daily with a meal.   multivitamins ther. w/minerals Tabs tablet Take 2 tablets by mouth daily.   mupirocin cream 2 % Commonly known as:  BACTROBAN Apply 1 application topically 3 (three) times daily.   omeprazole 20 MG capsule Commonly known as:  PRILOSEC Take 20 mg by mouth 2 (two) times daily.   oxyCODONE 5 MG immediate release tablet Commonly known as:  Oxy IR/ROXICODONE Take 1 tablet (5 mg total) by mouth every 4 (four) hours as needed for severe pain.   polyethylene glycol packet Commonly known as:  MIRALAX Take 17 g by mouth daily. What changed:    when to take this  reasons to take this   prochlorperazine 10 MG tablet Commonly known as:  COMPAZINE Take 1 tablet (10 mg total) by mouth every 6 (six) hours as needed for nausea or vomiting.   senna 8.6 MG Tabs tablet Commonly known as:  SENOKOT Take 1  tablet (8.6 mg total) by mouth 2 (two) times daily.   tiotropium 18 MCG inhalation capsule Commonly known as:  SPIRIVA Place 18 mcg into inhaler and inhale daily.   traMADol 50 MG tablet Commonly known as:  ULTRAM Take 50 mg by mouth at bedtime as needed for moderate pain.   trolamine salicylate 10 % cream Commonly known as:  ASPERCREME Apply 1 application topically 2 (two) times daily as needed for muscle pain.       Follow-up Information    Ladell Pier, MD Follow up.   Specialty:  Oncology Why:  Follow-up for Neulasta injection  03/29/2017 Lab visit 04/04/2017 Contact information: [place] [id number] [phone]           Hospital Course: Jeffery Wood has non-Hodgkin's lymphoma.  He was admitted electively on 03/24/2017 to complete cycle 3 of systemic therapy with EPOCH.  He completed infusional chemotherapy beginning 03/24/2017 and ending on 03/28/2017.  He received cyclophosphamide prior to discharge on 03/28/2017.  He received intrathecal methotrexate on 03/27/2017.  Jeffery Wood tolerated the chemotherapy without significant acute toxicity.  He was maintained on intravenous hydration throughout this admission.   A chest x-ray on 03/24/2017 revealed significant improvement in the right upper chest mass.  Jeffery Wood appeared stable for discharge on the morning of 03/28/2017.  He will return to the Cancer center 03/29/2017 to receive Neulasta.  He is scheduled for a lab visit 04/04/2017.  Jeffery Wood will undergo a restaging PET scan after this cycle of chemotherapy.    Signed: Betsy Coder, MD 03/28/2017, 12:49 PM

## 2017-03-29 ENCOUNTER — Inpatient Hospital Stay: Payer: BC Managed Care – PPO

## 2017-03-29 DIAGNOSIS — C8338 Diffuse large B-cell lymphoma, lymph nodes of multiple sites: Secondary | ICD-10-CM | POA: Diagnosis not present

## 2017-03-29 DIAGNOSIS — Z5112 Encounter for antineoplastic immunotherapy: Secondary | ICD-10-CM | POA: Diagnosis not present

## 2017-03-29 DIAGNOSIS — Z85828 Personal history of other malignant neoplasm of skin: Secondary | ICD-10-CM | POA: Diagnosis not present

## 2017-03-29 DIAGNOSIS — K5903 Drug induced constipation: Secondary | ICD-10-CM | POA: Diagnosis not present

## 2017-03-29 DIAGNOSIS — M545 Low back pain: Secondary | ICD-10-CM | POA: Diagnosis not present

## 2017-03-29 DIAGNOSIS — C8330 Diffuse large B-cell lymphoma, unspecified site: Secondary | ICD-10-CM

## 2017-03-29 DIAGNOSIS — G893 Neoplasm related pain (acute) (chronic): Secondary | ICD-10-CM | POA: Diagnosis not present

## 2017-03-29 DIAGNOSIS — L539 Erythematous condition, unspecified: Secondary | ICD-10-CM | POA: Diagnosis not present

## 2017-03-29 DIAGNOSIS — Z5189 Encounter for other specified aftercare: Secondary | ICD-10-CM | POA: Diagnosis present

## 2017-03-29 MED ORDER — PEGFILGRASTIM INJECTION 6 MG/0.6ML ~~LOC~~
6.0000 mg | PREFILLED_SYRINGE | Freq: Once | SUBCUTANEOUS | Status: AC
  Administered 2017-03-29: 6 mg via SUBCUTANEOUS

## 2017-03-29 NOTE — Patient Instructions (Signed)
Pegfilgrastim injection What is this medicine? PEGFILGRASTIM (PEG fil gra stim) is a long-acting granulocyte colony-stimulating factor that stimulates the growth of neutrophils, a type of Kmetz blood cell important in the body's fight against infection. It is used to reduce the incidence of fever and infection in patients with certain types of cancer who are receiving chemotherapy that affects the bone marrow, and to increase survival after being exposed to high doses of radiation. This medicine may be used for other purposes; ask your health care provider or pharmacist if you have questions. COMMON BRAND NAME(S): Neulasta What should I tell my health care provider before I take this medicine? They need to know if you have any of these conditions: -kidney disease -latex allergy -ongoing radiation therapy -sickle cell disease -skin reactions to acrylic adhesives (On-Body Injector only) -an unusual or allergic reaction to pegfilgrastim, filgrastim, other medicines, foods, dyes, or preservatives -pregnant or trying to get pregnant -breast-feeding How should I use this medicine? This medicine is for injection under the skin. If you get this medicine at home, you will be taught how to prepare and give the pre-filled syringe or how to use the On-body Injector. Refer to the patient Instructions for Use for detailed instructions. Use exactly as directed. Tell your healthcare provider immediately if you suspect that the On-body Injector may not have performed as intended or if you suspect the use of the On-body Injector resulted in a missed or partial dose. It is important that you put your used needles and syringes in a special sharps container. Do not put them in a trash can. If you do not have a sharps container, call your pharmacist or healthcare provider to get one. Talk to your pediatrician regarding the use of this medicine in children. While this drug may be prescribed for selected conditions,  precautions do apply. Overdosage: If you think you have taken too much of this medicine contact a poison control center or emergency room at once. NOTE: This medicine is only for you. Do not share this medicine with others. What if I miss a dose? It is important not to miss your dose. Call your doctor or health care professional if you miss your dose. If you miss a dose due to an On-body Injector failure or leakage, a new dose should be administered as soon as possible using a single prefilled syringe for manual use. What may interact with this medicine? Interactions have not been studied. Give your health care provider a list of all the medicines, herbs, non-prescription drugs, or dietary supplements you use. Also tell them if you smoke, drink alcohol, or use illegal drugs. Some items may interact with your medicine. This list may not describe all possible interactions. Give your health care provider a list of all the medicines, herbs, non-prescription drugs, or dietary supplements you use. Also tell them if you smoke, drink alcohol, or use illegal drugs. Some items may interact with your medicine. What should I watch for while using this medicine? You may need blood work done while you are taking this medicine. If you are going to need a MRI, CT scan, or other procedure, tell your doctor that you are using this medicine (On-Body Injector only). What side effects may I notice from receiving this medicine? Side effects that you should report to your doctor or health care professional as soon as possible: -allergic reactions like skin rash, itching or hives, swelling of the face, lips, or tongue -dizziness -fever -pain, redness, or irritation at site   where injected -pinpoint red spots on the skin -red or dark-brown urine -shortness of breath or breathing problems -stomach or side pain, or pain at the shoulder -swelling -tiredness -trouble passing urine or change in the amount of urine Side  effects that usually do not require medical attention (report to your doctor or health care professional if they continue or are bothersome): -bone pain -muscle pain This list may not describe all possible side effects. Call your doctor for medical advice about side effects. You may report side effects to FDA at 1-800-FDA-1088. Where should I keep my medicine? Keep out of the reach of children. Store pre-filled syringes in a refrigerator between 2 and 8 degrees C (36 and 46 degrees F). Do not freeze. Keep in carton to protect from light. Throw away this medicine if it is left out of the refrigerator for more than 48 hours. Throw away any unused medicine after the expiration date. NOTE: This sheet is a summary. It may not cover all possible information. If you have questions about this medicine, talk to your doctor, pharmacist, or health care provider.  2018 Elsevier/Gold Standard (2016-01-13 12:58:03)  

## 2017-04-04 ENCOUNTER — Telehealth: Payer: Self-pay | Admitting: *Deleted

## 2017-04-04 ENCOUNTER — Inpatient Hospital Stay: Payer: BC Managed Care – PPO | Attending: Oncology

## 2017-04-04 DIAGNOSIS — C8590 Non-Hodgkin lymphoma, unspecified, unspecified site: Secondary | ICD-10-CM

## 2017-04-04 LAB — CBC WITH DIFFERENTIAL (CANCER CENTER ONLY)
BASOS PCT: 3 %
Basophils Absolute: 0.1 10*3/uL (ref 0.0–0.1)
Eosinophils Absolute: 0.1 10*3/uL (ref 0.0–0.5)
Eosinophils Relative: 2 %
HEMATOCRIT: 35.6 % — AB (ref 38.4–49.9)
HEMOGLOBIN: 11.7 g/dL — AB (ref 13.0–17.1)
LYMPHS ABS: 0.8 10*3/uL — AB (ref 0.9–3.3)
LYMPHS PCT: 15 %
MCH: 28.5 pg (ref 27.2–33.4)
MCHC: 32.7 g/dL (ref 32.0–36.0)
MCV: 87.1 fL (ref 79.3–98.0)
MONOS PCT: 18 %
Monocytes Absolute: 0.9 10*3/uL (ref 0.1–0.9)
NEUTROS ABS: 3.4 10*3/uL (ref 1.5–6.5)
NEUTROS PCT: 62 %
Platelet Count: 192 10*3/uL (ref 140–400)
RBC: 4.09 MIL/uL — ABNORMAL LOW (ref 4.20–5.82)
RDW: 16.2 % — AB (ref 11.0–14.6)
WBC Count: 5.3 10*3/uL (ref 4.0–10.3)

## 2017-04-04 NOTE — Telephone Encounter (Signed)
Notified pt of lab results, per MD note below. He voiced understanding.

## 2017-04-04 NOTE — Telephone Encounter (Signed)
-----   Message from Ladell Pier, MD sent at 04/04/2017  2:13 PM EST ----- Please call patient blood counts look okay, call for fever or bleeding, follow-up as scheduled

## 2017-04-05 ENCOUNTER — Encounter: Payer: Self-pay | Admitting: Oncology

## 2017-04-09 ENCOUNTER — Telehealth: Payer: Self-pay

## 2017-04-09 NOTE — Telephone Encounter (Addendum)
Spoke with pt regarding message below. Pt voiced understanding.   Spoke with Dawn in pt placement to inform.    ----- Message from Owens Shark, NP sent at 04/09/2017  9:15 AM EDT ----- Please call him, plan is to admit 3/15, we will see him on floor

## 2017-04-11 ENCOUNTER — Ambulatory Visit (HOSPITAL_COMMUNITY)
Admission: RE | Admit: 2017-04-11 | Discharge: 2017-04-11 | Disposition: A | Discharge status: Home / Self Care | Payer: BC Managed Care – PPO | Source: Ambulatory Visit | Attending: Oncology | Admitting: Oncology

## 2017-04-11 DIAGNOSIS — G4730 Sleep apnea, unspecified: Secondary | ICD-10-CM | POA: Diagnosis present

## 2017-04-11 DIAGNOSIS — C8512 Unspecified B-cell lymphoma, intrathoracic lymph nodes: Principal | ICD-10-CM | POA: Diagnosis present

## 2017-04-11 DIAGNOSIS — G4700 Insomnia, unspecified: Secondary | ICD-10-CM | POA: Diagnosis present

## 2017-04-11 DIAGNOSIS — T40605A Adverse effect of unspecified narcotics, initial encounter: Secondary | ICD-10-CM | POA: Diagnosis present

## 2017-04-11 DIAGNOSIS — C8590 Non-Hodgkin lymphoma, unspecified, unspecified site: Secondary | ICD-10-CM

## 2017-04-11 DIAGNOSIS — M545 Low back pain: Secondary | ICD-10-CM | POA: Diagnosis present

## 2017-04-11 DIAGNOSIS — Z7984 Long term (current) use of oral hypoglycemic drugs: Secondary | ICD-10-CM

## 2017-04-11 DIAGNOSIS — Z79899 Other long term (current) drug therapy: Secondary | ICD-10-CM

## 2017-04-11 DIAGNOSIS — I10 Essential (primary) hypertension: Secondary | ICD-10-CM | POA: Diagnosis present

## 2017-04-11 DIAGNOSIS — K219 Gastro-esophageal reflux disease without esophagitis: Secondary | ICD-10-CM | POA: Diagnosis present

## 2017-04-11 DIAGNOSIS — Z85828 Personal history of other malignant neoplasm of skin: Secondary | ICD-10-CM

## 2017-04-11 DIAGNOSIS — I871 Compression of vein: Secondary | ICD-10-CM | POA: Diagnosis present

## 2017-04-11 DIAGNOSIS — M109 Gout, unspecified: Secondary | ICD-10-CM | POA: Diagnosis present

## 2017-04-11 DIAGNOSIS — G8929 Other chronic pain: Secondary | ICD-10-CM | POA: Diagnosis present

## 2017-04-11 DIAGNOSIS — Z9221 Personal history of antineoplastic chemotherapy: Secondary | ICD-10-CM

## 2017-04-11 DIAGNOSIS — K5903 Drug induced constipation: Secondary | ICD-10-CM | POA: Diagnosis present

## 2017-04-11 DIAGNOSIS — J449 Chronic obstructive pulmonary disease, unspecified: Secondary | ICD-10-CM | POA: Diagnosis present

## 2017-04-11 DIAGNOSIS — R350 Frequency of micturition: Secondary | ICD-10-CM | POA: Diagnosis present

## 2017-04-11 DIAGNOSIS — E1141 Type 2 diabetes mellitus with diabetic mononeuropathy: Secondary | ICD-10-CM | POA: Diagnosis present

## 2017-04-11 DIAGNOSIS — N401 Enlarged prostate with lower urinary tract symptoms: Secondary | ICD-10-CM | POA: Diagnosis present

## 2017-04-11 LAB — GLUCOSE, CAPILLARY: Glucose-Capillary: 156 mg/dL — ABNORMAL HIGH (ref 65–99)

## 2017-04-11 MED ORDER — FLUDEOXYGLUCOSE F - 18 (FDG) INJECTION: 11.9000 | Freq: Once | INTRAVENOUS | Status: DC | PRN

## 2017-04-12 ENCOUNTER — Telehealth: Payer: Self-pay

## 2017-04-12 NOTE — Telephone Encounter (Addendum)
Pt voiced understanding regarding message below   ----- Message from Ladell Pier, MD sent at 04/11/2017  8:21 PM EDT ----- Please call patient, PET shows good response to therapy, plan to continue R-EPOCH with IT Methotrexate for 3 more cycles. Plan for admission and chemotherapy 3/15, Lattie Haw and I will see him on the floore 3/15

## 2017-04-13 ENCOUNTER — Encounter (HOSPITAL_COMMUNITY): Payer: Self-pay | Admitting: *Deleted

## 2017-04-13 ENCOUNTER — Ambulatory Visit: Payer: BC Managed Care – PPO

## 2017-04-13 ENCOUNTER — Other Ambulatory Visit: Payer: BC Managed Care – PPO

## 2017-04-13 ENCOUNTER — Inpatient Hospital Stay (HOSPITAL_COMMUNITY)
Admission: AD | Admit: 2017-04-13 | Discharge: 2017-04-17 | DRG: 841 | Disposition: A | Discharge status: Home / Self Care | Payer: BC Managed Care – PPO | Source: Ambulatory Visit | Attending: Oncology | Admitting: Oncology

## 2017-04-13 ENCOUNTER — Ambulatory Visit: Payer: BC Managed Care – PPO | Admitting: Nurse Practitioner

## 2017-04-13 DIAGNOSIS — I871 Compression of vein: Secondary | ICD-10-CM | POA: Diagnosis present

## 2017-04-13 DIAGNOSIS — T40605A Adverse effect of unspecified narcotics, initial encounter: Secondary | ICD-10-CM | POA: Diagnosis present

## 2017-04-13 DIAGNOSIS — R350 Frequency of micturition: Secondary | ICD-10-CM

## 2017-04-13 DIAGNOSIS — Z5112 Encounter for antineoplastic immunotherapy: Secondary | ICD-10-CM

## 2017-04-13 DIAGNOSIS — C8512 Unspecified B-cell lymphoma, intrathoracic lymph nodes: Secondary | ICD-10-CM | POA: Diagnosis present

## 2017-04-13 DIAGNOSIS — K219 Gastro-esophageal reflux disease without esophagitis: Secondary | ICD-10-CM | POA: Diagnosis present

## 2017-04-13 DIAGNOSIS — Z9221 Personal history of antineoplastic chemotherapy: Secondary | ICD-10-CM | POA: Diagnosis not present

## 2017-04-13 DIAGNOSIS — Z7984 Long term (current) use of oral hypoglycemic drugs: Secondary | ICD-10-CM | POA: Diagnosis not present

## 2017-04-13 DIAGNOSIS — M792 Neuralgia and neuritis, unspecified: Secondary | ICD-10-CM

## 2017-04-13 DIAGNOSIS — G629 Polyneuropathy, unspecified: Secondary | ICD-10-CM | POA: Diagnosis not present

## 2017-04-13 DIAGNOSIS — C8330 Diffuse large B-cell lymphoma, unspecified site: Secondary | ICD-10-CM

## 2017-04-13 DIAGNOSIS — Z79899 Other long term (current) drug therapy: Secondary | ICD-10-CM | POA: Diagnosis not present

## 2017-04-13 DIAGNOSIS — M109 Gout, unspecified: Secondary | ICD-10-CM | POA: Diagnosis present

## 2017-04-13 DIAGNOSIS — K5900 Constipation, unspecified: Secondary | ICD-10-CM | POA: Diagnosis not present

## 2017-04-13 DIAGNOSIS — M545 Low back pain: Secondary | ICD-10-CM | POA: Diagnosis present

## 2017-04-13 DIAGNOSIS — E1141 Type 2 diabetes mellitus with diabetic mononeuropathy: Secondary | ICD-10-CM | POA: Diagnosis present

## 2017-04-13 DIAGNOSIS — G8929 Other chronic pain: Secondary | ICD-10-CM | POA: Diagnosis present

## 2017-04-13 DIAGNOSIS — C8590 Non-Hodgkin lymphoma, unspecified, unspecified site: Secondary | ICD-10-CM | POA: Diagnosis present

## 2017-04-13 DIAGNOSIS — C8338 Diffuse large B-cell lymphoma, lymph nodes of multiple sites: Secondary | ICD-10-CM

## 2017-04-13 DIAGNOSIS — Z85828 Personal history of other malignant neoplasm of skin: Secondary | ICD-10-CM | POA: Diagnosis not present

## 2017-04-13 DIAGNOSIS — I10 Essential (primary) hypertension: Secondary | ICD-10-CM | POA: Diagnosis present

## 2017-04-13 DIAGNOSIS — K5903 Drug induced constipation: Secondary | ICD-10-CM | POA: Diagnosis present

## 2017-04-13 DIAGNOSIS — Z5111 Encounter for antineoplastic chemotherapy: Secondary | ICD-10-CM

## 2017-04-13 DIAGNOSIS — K30 Functional dyspepsia: Secondary | ICD-10-CM | POA: Diagnosis not present

## 2017-04-13 DIAGNOSIS — G4730 Sleep apnea, unspecified: Secondary | ICD-10-CM | POA: Diagnosis present

## 2017-04-13 DIAGNOSIS — N401 Enlarged prostate with lower urinary tract symptoms: Secondary | ICD-10-CM | POA: Diagnosis present

## 2017-04-13 DIAGNOSIS — G4700 Insomnia, unspecified: Secondary | ICD-10-CM | POA: Diagnosis present

## 2017-04-13 DIAGNOSIS — J449 Chronic obstructive pulmonary disease, unspecified: Secondary | ICD-10-CM | POA: Diagnosis present

## 2017-04-13 LAB — CBC WITH DIFFERENTIAL/PLATELET
BASOS PCT: 1 %
Basophils Absolute: 0.1 10*3/uL (ref 0.0–0.1)
EOS ABS: 0.1 10*3/uL (ref 0.0–0.7)
Eosinophils Relative: 1 %
HCT: 40.6 % (ref 39.0–52.0)
Hemoglobin: 13.5 g/dL (ref 13.0–17.0)
Lymphocytes Relative: 15 %
Lymphs Abs: 2.6 10*3/uL (ref 0.7–4.0)
MCH: 30 pg (ref 26.0–34.0)
MCHC: 33.3 g/dL (ref 30.0–36.0)
MCV: 90.2 fL (ref 78.0–100.0)
MONO ABS: 0.5 10*3/uL (ref 0.1–1.0)
MONOS PCT: 3 %
NEUTROS PCT: 80 %
Neutro Abs: 14.5 10*3/uL — ABNORMAL HIGH (ref 1.7–7.7)
Platelets: 268 10*3/uL (ref 150–400)
RBC: 4.5 MIL/uL (ref 4.22–5.81)
RDW: 16.4 % — AB (ref 11.5–15.5)
WBC: 17.9 10*3/uL — ABNORMAL HIGH (ref 4.0–10.5)

## 2017-04-13 LAB — COMPREHENSIVE METABOLIC PANEL
ALBUMIN: 3.7 g/dL (ref 3.5–5.0)
ALT: 23 U/L (ref 17–63)
ANION GAP: 10 (ref 5–15)
AST: 24 U/L (ref 15–41)
Alkaline Phosphatase: 88 U/L (ref 38–126)
BUN: 19 mg/dL (ref 6–20)
CO2: 25 mmol/L (ref 22–32)
Calcium: 9.5 mg/dL (ref 8.9–10.3)
Chloride: 102 mmol/L (ref 101–111)
Creatinine, Ser: 0.63 mg/dL (ref 0.61–1.24)
GFR calc Af Amer: >60 mL/min (ref >60)
GFR calc non Af Amer: >60 mL/min (ref >60)
GLUCOSE: 167 mg/dL — AB (ref 65–99)
POTASSIUM: 4.1 mmol/L (ref 3.5–5.1)
SODIUM: 137 mmol/L (ref 135–145)
Total Bilirubin: 0.5 mg/dL (ref 0.3–1.2)
Total Protein: 6.7 g/dL (ref 6.5–8.1)

## 2017-04-13 LAB — URIC ACID: Uric Acid, Serum: 4.8 mg/dL (ref 4.4–7.6)

## 2017-04-13 LAB — LACTATE DEHYDROGENASE: LDH: 125 U/L (ref 98–192)

## 2017-04-13 MED ORDER — HYDROCODONE-ACETAMINOPHEN 5-325 MG PO TABS: 1.0000 | ORAL_TABLET | Freq: Four times a day (QID) | ORAL | Status: DC | PRN

## 2017-04-13 MED ORDER — TIOTROPIUM BROMIDE MONOHYDRATE 18 MCG IN CAPS
18.0000 ug | ORAL_CAPSULE | Freq: Every day | RESPIRATORY_TRACT | Status: DC
  Administered 2017-04-14 – 2017-04-17 (×4): 18 ug via RESPIRATORY_TRACT
  Filled 2017-04-13: qty 5

## 2017-04-13 MED ORDER — TRAMADOL HCL 50 MG PO TABS
50.0000 mg | ORAL_TABLET | Freq: Every evening | ORAL | Status: DC | PRN
  Administered 2017-04-13: 50 mg via ORAL
  Filled 2017-04-13: qty 1

## 2017-04-13 MED ORDER — MOMETASONE FURO-FORMOTEROL FUM 100-5 MCG/ACT IN AERO
2.0000 | INHALATION_SPRAY | Freq: Two times a day (BID) | RESPIRATORY_TRACT | Status: DC
  Administered 2017-04-13 – 2017-04-17 (×8): 2 via RESPIRATORY_TRACT
  Filled 2017-04-13: qty 8.8

## 2017-04-13 MED ORDER — SODIUM CHLORIDE 0.9% FLUSH: 3.0000 mL | INTRAVENOUS | Status: DC | PRN

## 2017-04-13 MED ORDER — COLD PACK MISC ONCOLOGY
1.0000 | Freq: Once | Status: DC | PRN
  Filled 2017-04-13: qty 1

## 2017-04-13 MED ORDER — HEPARIN SOD (PORK) LOCK FLUSH 100 UNIT/ML IV SOLN: 500.0000 [IU] | Freq: Once | INTRAVENOUS | Status: DC | PRN

## 2017-04-13 MED ORDER — DIPHENHYDRAMINE HCL 50 MG PO CAPS
50.0000 mg | ORAL_CAPSULE | Freq: Once | ORAL | Status: AC
  Administered 2017-04-13: 50 mg via ORAL
  Filled 2017-04-13: qty 1

## 2017-04-13 MED ORDER — LEVALBUTEROL TARTRATE 45 MCG/ACT IN AERO: 2.0000 | INHALATION_SPRAY | Freq: Every day | RESPIRATORY_TRACT | Status: DC | PRN

## 2017-04-13 MED ORDER — SODIUM CHLORIDE 0.9 % IV SOLN
INTRAVENOUS | Status: DC
  Administered 2017-04-13 – 2017-04-17 (×2): via INTRAVENOUS

## 2017-04-13 MED ORDER — PANTOPRAZOLE SODIUM 40 MG PO TBEC
40.0000 mg | DELAYED_RELEASE_TABLET | Freq: Every day | ORAL | Status: DC
  Administered 2017-04-14: 40 mg via ORAL
  Filled 2017-04-13: qty 1

## 2017-04-13 MED ORDER — SENNA 8.6 MG PO TABS
1.0000 | ORAL_TABLET | Freq: Two times a day (BID) | ORAL | Status: DC
  Administered 2017-04-13 – 2017-04-17 (×8): 8.6 mg via ORAL
  Filled 2017-04-13 (×9): qty 1

## 2017-04-13 MED ORDER — PREDNISONE 20 MG PO TABS
75.0000 mg | ORAL_TABLET | Freq: Two times a day (BID) | ORAL | Status: DC
  Administered 2017-04-13 – 2017-04-17 (×9): 75 mg via ORAL
  Filled 2017-04-13 (×9): qty 1

## 2017-04-13 MED ORDER — POLYETHYLENE GLYCOL 3350 17 G PO PACK
17.0000 g | PACK | Freq: Every day | ORAL | Status: DC | PRN
  Administered 2017-04-13 – 2017-04-15 (×3): 17 g via ORAL
  Filled 2017-04-13 (×3): qty 1

## 2017-04-13 MED ORDER — ASPIRIN EC 81 MG PO TBEC
81.0000 mg | DELAYED_RELEASE_TABLET | Freq: Every day | ORAL | Status: DC
  Administered 2017-04-14 – 2017-04-17 (×4): 81 mg via ORAL
  Filled 2017-04-13 (×6): qty 1

## 2017-04-13 MED ORDER — SODIUM CHLORIDE 0.9 % IV SOLN
Freq: Once | INTRAVENOUS | Status: AC
  Administered 2017-04-13: 8 mg via INTRAVENOUS
  Filled 2017-04-13: qty 4

## 2017-04-13 MED ORDER — ALBUTEROL SULFATE (2.5 MG/3ML) 0.083% IN NEBU: 2.5000 mg | INHALATION_SOLUTION | Freq: Every evening | RESPIRATORY_TRACT | Status: DC | PRN

## 2017-04-13 MED ORDER — CLONAZEPAM 0.5 MG PO TABS
0.5000 mg | ORAL_TABLET | Freq: Every day | ORAL | Status: DC
  Administered 2017-04-13 – 2017-04-16 (×4): 0.5 mg via ORAL
  Filled 2017-04-13 (×4): qty 1

## 2017-04-13 MED ORDER — VINCRISTINE SULFATE CHEMO INJECTION 1 MG/ML
Freq: Once | INTRAVENOUS | Status: AC
  Administered 2017-04-13: 17:00:00 via INTRAVENOUS
  Filled 2017-04-13: qty 12

## 2017-04-13 MED ORDER — SODIUM CHLORIDE 0.9% FLUSH: 10.0000 mL | INTRAVENOUS | Status: DC | PRN

## 2017-04-13 MED ORDER — HEPARIN SOD (PORK) LOCK FLUSH 100 UNIT/ML IV SOLN: 250.0000 [IU] | Freq: Once | INTRAVENOUS | Status: DC | PRN

## 2017-04-13 MED ORDER — LORATADINE 10 MG PO TABS: 10.0000 mg | ORAL_TABLET | Freq: Every day | ORAL | Status: DC | PRN

## 2017-04-13 MED ORDER — METFORMIN HCL 500 MG PO TABS
1000.0000 mg | ORAL_TABLET | Freq: Two times a day (BID) | ORAL | Status: DC
  Administered 2017-04-13 – 2017-04-17 (×8): 1000 mg via ORAL
  Filled 2017-04-13 (×8): qty 2

## 2017-04-13 MED ORDER — ACETAMINOPHEN ER 650 MG PO TBCR: 1300.0000 mg | EXTENDED_RELEASE_TABLET | Freq: Two times a day (BID) | ORAL | Status: DC | PRN

## 2017-04-13 MED ORDER — ACETAMINOPHEN 325 MG PO TABS
650.0000 mg | ORAL_TABLET | Freq: Once | ORAL | Status: AC
  Administered 2017-04-13: 650 mg via ORAL
  Filled 2017-04-13: qty 2

## 2017-04-13 MED ORDER — ALTEPLASE 2 MG IJ SOLR
2.0000 mg | Freq: Once | INTRAMUSCULAR | Status: DC | PRN
  Filled 2017-04-13: qty 2

## 2017-04-13 MED ORDER — PROCHLORPERAZINE MALEATE 10 MG PO TABS: 10.0000 mg | ORAL_TABLET | Freq: Four times a day (QID) | ORAL | Status: DC | PRN

## 2017-04-13 MED ORDER — HOT PACK MISC ONCOLOGY
1.0000 | Freq: Once | Status: DC | PRN
  Filled 2017-04-13: qty 1

## 2017-04-13 MED ORDER — SODIUM CHLORIDE 0.9 % IV SOLN
INTRAVENOUS | Status: DC
  Administered 2017-04-13: 20 mL via INTRAVENOUS

## 2017-04-13 MED ORDER — LOSARTAN POTASSIUM 50 MG PO TABS
100.0000 mg | ORAL_TABLET | Freq: Every day | ORAL | Status: DC
  Administered 2017-04-13 – 2017-04-16 (×4): 100 mg via ORAL
  Filled 2017-04-13 (×4): qty 2

## 2017-04-13 MED ORDER — ALBUTEROL SULFATE (2.5 MG/3ML) 0.083% IN NEBU: 2.5000 mg | INHALATION_SOLUTION | Freq: Every day | RESPIRATORY_TRACT | Status: DC | PRN

## 2017-04-13 MED ORDER — GLIMEPIRIDE 2 MG PO TABS
2.0000 mg | ORAL_TABLET | Freq: Two times a day (BID) | ORAL | Status: DC
  Administered 2017-04-13 – 2017-04-17 (×8): 2 mg via ORAL
  Filled 2017-04-13 (×9): qty 1

## 2017-04-13 MED ORDER — LIDOCAINE-PRILOCAINE 2.5-2.5 % EX CREA: TOPICAL_CREAM | Freq: Once | CUTANEOUS | Status: DC

## 2017-04-13 MED ORDER — SODIUM CHLORIDE 0.9 % IV SOLN
375.0000 mg/m2 | Freq: Once | INTRAVENOUS | Status: AC
  Administered 2017-04-13: 900 mg via INTRAVENOUS
  Filled 2017-04-13: qty 40

## 2017-04-13 MED ORDER — ACETAMINOPHEN 325 MG PO TABS: 650.0000 mg | ORAL_TABLET | Freq: Four times a day (QID) | ORAL | Status: DC | PRN

## 2017-04-13 NOTE — H&P (Addendum)
Admission History and Physical      Chief Complaint: Non-Hodgkin's lymphoma HPI: Jeffery Wood is a 62 year old man diagnosed with high-grade B cell lymphoma following a mediastinoscopy procedure 02/05/2017.  The lymphoma has an 8:14 translocation.  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  He completed a second cycle of chemotherapy with rituximab/EPOCH beginning 03/02/2017.  He completed cycle 3 rituximab/EPOCH beginning 03/22/2017.  He completed cycle 1 intrathecal prophylaxis with methotrexate 03/27/2017.  Restaging PET scan 04/11/2017 shows significant decrease in FDG uptake associated within the chest, abdomen and pelvis and axial and proximal appendicular skeleton.  No new foci of abnormal radiotracer uptake identified.  He presents today for elective admission to proceed with cycle 4 rituximab/EPOCH.  He will again receive intrathecal prophylaxis with methotrexate during this hospitalization.    Past Medical History:  Diagnosis Date  . Arthritis   . Asthma   . Basal cell carcinoma (BCC) of right temple region   . Cancer ([place])    basal cell of right temple; carcinoids  . Complication of anesthesia    hard to wake up after bronchoscopy  . COPD (chronic obstructive pulmonary disease) ([place])   . Diabetes ([place])   . Dyspnea   . GERD (gastroesophageal reflux disease)   . Gout   . Headache   . History of hiatal hernia 05/16/2006   small noted on EGD  . Hypertension   . Lung mass   . 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  . Sleep apnea    uses mouth piece    Past Surgical History:  Procedure Laterality Date  . BICEPS TENDON REPAIR Left   . CARPAL TUNNEL RELEASE    . COLONOSCOPY    . KNEE ARTHROSCOPY    . LEFT HEART CATHETERIZATION WITH CORONARY ANGIOGRAM N/A 12/07/2010   Procedure: LEFT HEART CATHETERIZATION WITH CORONARY ANGIOGRAM;  Surgeon: Leonie Man, MD;  Location: [facility] CATH LAB;  Service: Cardiovascular;  Laterality: N/A;  .  PILONIDAL CYST EXCISION    . PORTACATH PLACEMENT Left 02/27/2017   Procedure: INSERTION PORT-A-CATH;  Surgeon: [name], MD;  Location: [facility] ORS;  Service: General;  Laterality: Left;  . UPPER GASTROINTESTINAL ENDOSCOPY  05/16/2006  . VIDEO BRONCHOSCOPY WITH ENDOBRONCHIAL ULTRASOUND N/A 01/24/2017   Procedure: VIDEO BRONCHOSCOPY WITH ENDOBRONCHIAL ULTRASOUND with TRANSBRONCHIAL BIOPSY;  Surgeon: Grace Isaac, MD;  Location: [facility];  Service: Thoracic;  Laterality: N/A;  . VIDEO MEDIASTINOSCOPY N/A 02/05/2017   Procedure: VIDEO MEDIASTINOSCOPY;  Surgeon: Grace Isaac, MD;  Location: [facility];  Service: Thoracic;  Laterality: N/A;    Medications Prior to Admission  Medication Sig Dispense Refill  . acetaminophen (TYLENOL) 650 MG CR tablet Take 1,300 mg by mouth 2 (two) times daily as needed for pain.     Marland Kitchen albuterol (PROVENTIL) (2.5 MG/3ML) 0.083% nebulizer solution Take 2.5 mg by nebulization at bedtime as needed for wheezing or shortness of breath.    Marland Kitchen aspirin EC 81 MG tablet Take 81 mg by mouth daily.      . betamethasone dipropionate (DIPROLENE) 0.05 % cream Apply 1 application topically daily as needed (for psoriasis).     . celecoxib (CELEBREX) 200 MG capsule Take 200 mg by mouth 2 (two) times daily.    . clonazePAM (KLONOPIN) 0.5 MG tablet Take 0.5 mg by mouth at bedtime.      Marland Kitchen etodolac (LODINE) 400 MG tablet Take 400 mg by mouth 3 (three) times daily as  needed (gout pain).     . Fluticasone-Salmeterol (ADVAIR) 100-50 MCG/DOSE AEPB Inhale 1 puff into the lungs daily.      Marland Kitchen glimepiride (AMARYL) 2 MG tablet Take 2 mg by mouth 2 (two) times daily before a meal.     . levalbuterol (XOPENEX HFA) 45 MCG/ACT inhaler Inhale 2 puffs into the lungs daily as needed for wheezing or shortness of breath.     . loratadine (CLARITIN) 10 MG tablet Take 10 mg by mouth daily as needed for allergies.     Marland Kitchen losartan (COZAAR) 100 MG tablet Take 100 mg by mouth at bedtime.     . metFORMIN  (GLUCOPHAGE) 1000 MG tablet Take 1,000 mg by mouth 2 (two) times daily with a meal.      . Multiple Vitamins-Minerals (MULTIVITAMINS THER. W/MINERALS) TABS Take 2 tablets by mouth daily.     . mupirocin cream (BACTROBAN) 2 % Apply 1 application topically 3 (three) times daily. 30 g 0  . omeprazole (PRILOSEC) 20 MG capsule Take 20 mg by mouth 2 (two) times daily.      . polyethylene glycol (MIRALAX) packet Take 17 g by mouth daily. (Patient taking differently: Take 17 g by mouth daily as needed for mild constipation or moderate constipation. ) 14 each 0  . prochlorperazine (COMPAZINE) 10 MG tablet Take 1 tablet (10 mg total) by mouth every 6 (six) hours as needed for nausea or vomiting. 30 tablet 0  . senna (SENOKOT) 8.6 MG TABS tablet Take 1 tablet (8.6 mg total) by mouth 2 (two) times daily. 120 each 0  . tiotropium (SPIRIVA) 18 MCG inhalation capsule Place 18 mcg into inhaler and inhale daily.    . traMADol (ULTRAM) 50 MG tablet Take 50 mg by mouth at bedtime as needed for moderate pain.     Marland Kitchen trolamine salicylate (ASPERCREME) 10 % cream Apply 1 application topically 2 (two) times daily as needed for muscle pain.     Marland Kitchen HYDROcodone-acetaminophen (NORCO/VICODIN) 5-325 MG tablet Take 1 tablet by mouth every 6 (six) hours as needed for moderate pain or severe pain. (Patient not taking: Reported on 04/13/2017) 20 tablet 0  . lidocaine-prilocaine (EMLA) cream Apply to port site one hour prior to use. Do not rub in. Cover with plastic. 30 g 1  . oxyCODONE (OXY IR/ROXICODONE) 5 MG immediate release tablet Take 1 tablet (5 mg total) by mouth every 4 (four) hours as needed for severe pain. (Patient not taking: Reported on 03/24/2017) 40 tablet 0   Scheduled Meds: . [START ON 04/14/2017] aspirin EC  81 mg Oral Daily  . clonazePAM  0.5 mg Oral QHS  . DOXOrubicin/vinCRIStine/etoposide CHEMO IV infusion for Inpatient CI   Intravenous Once  . glimepiride  2 mg Oral BID AC  . lidocaine-prilocaine   Topical Once   . losartan  100 mg Oral QHS  . metFORMIN  1,000 mg Oral BID WC  . mometasone-formoterol  2 puff Inhalation BID  . [START ON 04/14/2017] pantoprazole  40 mg Oral Daily  . predniSONE  75 mg Oral BID WC  . senna  1 tablet Oral BID  . [START ON 04/14/2017] tiotropium  18 mcg Inhalation Daily   Continuous Infusions: . sodium chloride 20 mL (04/13/17 1326)  . sodium chloride 50 mL/hr at 04/13/17 1014  . ondansetron (ZOFRAN) with dexamethasone (DECADRON) IV     PRN Meds:.acetaminophen, albuterol, albuterol, alteplase, Cold Pack, heparin lock flush, heparin lock flush, Hot Pack, HYDROcodone-acetaminophen, [START ON 04/14/2017] loratadine, polyethylene glycol,  prochlorperazine, sodium chloride flush, sodium chloride flush, traMADol  No Known Allergies  Family History  Problem Relation Age of Onset  . Emphysema Mother   . Asthma Mother   . Heart disease Father    Social history:  reports that he quit smoking about 29 years ago. His smoking use included cigarettes. He has a 60.00 pack-year smoking history. he has never used smokeless tobacco. He reports that he does not drink alcohol or use drugs.  ROS: He has an occasional night sweat.  He had an isolated fever last week.  The fever resolved with Tylenol and has not recurred.  Energy level continues to be improved.  He is no longer having right arm pain.  The redness at the mediastinoscopy incision site has resolved.  He continues to have dyspnea on exertion but overall notes marked improvement in shortness of breath.  He continues to have constipation but this is better as well.  No neuropathy symptoms.  Stable chronic back pain.  Physical:  Blood pressure 133/82, pulse 81, temperature 98.2 F (36.8 C), temperature source Oral, resp. rate 18, height 6' 3"  (1.905 m), weight 245 lb 11.2 oz (111.4 kg), SpO2 97 %.   HEENT: No thrush or ulcers. Chest: Lungs are clear bilaterally.  Mild decrease in breath sounds at the right upper chest.  No  respiratory distress. Cardiovascular: Regular rate and rhythm. Abdomen: Abdomen soft and nontender.  No hepatosplenomegaly.  No mass. Extremities: No upper or lower extremity edema. Neuro: Alert and oriented.  Motor exam appears grossly intact. Skin: No rash.  No erythema at the mediastinoscopy incision.  Labs:  Results for orders placed or performed during the hospital encounter of 04/13/17 (from the past 48 hour(s))  CBC with Differential     Status: Abnormal   Collection Time: 04/13/17  8:28 AM  Result Value Ref Range   WBC 17.9 (H) 4.0 - 10.5 K/uL   RBC 4.50 4.22 - 5.81 MIL/uL   Hemoglobin 13.5 13.0 - 17.0 g/dL   HCT 40.6 39.0 - 52.0 %   MCV 90.2 78.0 - 100.0 fL   MCH 30.0 26.0 - 34.0 pg   MCHC 33.3 30.0 - 36.0 g/dL   RDW 16.4 (H) 11.5 - 15.5 %   Platelets 268 150 - 400 K/uL   Neutrophils Relative % 80 %   Neutro Abs 14.5 (H) 1.7 - 7.7 K/uL   Lymphocytes Relative 15 %   Lymphs Abs 2.6 0.7 - 4.0 K/uL   Monocytes Relative 3 %   Monocytes Absolute 0.5 0.1 - 1.0 K/uL   Eosinophils Relative 1 %   Eosinophils Absolute 0.1 0.0 - 0.7 K/uL   Basophils Relative 1 %   Basophils Absolute 0.1 0.0 - 0.1 K/uL    Comment: Performed at [facility], [place] [address] [place], [place] [address]  Comprehensive metabolic panel     Status: Abnormal   Collection Time: 04/13/17  8:28 AM  Result Value Ref Range   Sodium 137 135 - 145 mmol/L   Potassium 4.1 3.5 - 5.1 mmol/L   Chloride 102 101 - 111 mmol/L   CO2 25 22 - 32 mmol/L   Glucose, Bld 167 (H) 65 - 99 mg/dL   BUN 19 6 - 20 mg/dL   Creatinine, Ser 0.63 0.61 - 1.24 mg/dL   Calcium 9.5 8.9 - 10.3 mg/dL   Total Protein 6.7 6.5 - 8.1 g/dL   Albumin 3.7 3.5 - 5.0 g/dL   AST 24 15 - 41  U/L   ALT 23 17 - 63 U/L   Alkaline Phosphatase 88 38 - 126 U/L   Total Bilirubin 0.5 0.3 - 1.2 mg/dL   GFR calc non Af Amer >60 >60 mL/min   GFR calc Af Amer >60 >60 mL/min    Comment: (NOTE) The eGFR has been calculated using the CKD  EPI equation. This calculation has not been validated in all clinical situations. eGFR's persistently <60 mL/min signify possible Chronic Kidney Disease.    Anion gap 10 5 - 15    Comment: Performed at [facility], [place] [address] [place], [place] [address]  Lactate dehydrogenase     Status: None   Collection Time: 04/13/17  8:28 AM  Result Value Ref Range   LDH 125 98 - 192 U/L    Comment: Performed at [facility], [place] [address] [place], [place] [address]  Uric acid     Status: None   Collection Time: 04/13/17  8:28 AM  Result Value Ref Range   Uric Acid, Serum 4.8 4.4 - 7.6 mg/dL    Comment: Performed at [facility], [place] [address] [place], [place] [address]   No results found.  Assessment/Plan 1. High-grade B-cell non-Hodgkin's lymphoma, molecular studies pending, clinical stage IV, IPI-low intermediate risk, FISH panel revealed an 8q24/14q31fsion (MYC/IgH), negative for BCL 6 and BCL-2  Chest x-ray 01/15/2017-medial right upper lobe/perihilar mass.   Chest CT 01/19/2017-mass constricting the superior vena cava, subcarinal adenopathy, right suprahilar/mediastinal mass.   12/26/2018status postbronchoscopy with transbronchial biopsy of level 7node and biopsy of aright upper lobe lung mass by Dr. GServando Snare There was compression of the right mainstem bronchus. A mass was noted in the right upper lobe. Mediastinal lymph nodes were seen on EBUS. Multiple biopsies of a level 7 node were obtained. Brushings and a biopsy of the right upper lobe mass were obtained. 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 Final pathology was nondiagnostic.   PET scan on 02/02/2017 showed hypermetabolism corresponding to the right sided mediastinal mass,progressive since the CT 01/19/2017. Metastatic disease/lymphoma within the bones, pericardium, stomach, bowel and abdominopelvic nodal stations.   Brain MRI  02/03/2017 showed no evidence of intracranial metastases or acute abnormality.   1/7/2019status postmediastinoscopy.  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 Molecular studies are pending.  Rituximab 02/08/2017  Cycle 1 CHOP 02/09/2017  Cycle 1 R-EPOCH 03/02/2017  Cycle 2 R-EPOCH 03/23/2017  Prophylactic intrathecal methotrexate 03/27/2017  Restaging PET scan 04/11/2017-significant decrease in FDG uptake associated with the chest, abdomen and pelvis and axial and proximal appendicular skeleton.  No new foci of abnormal radiotracer uptake identified.  Cycle 3 R-EPOCH 04/13/2017 2. Chest/upper backand right armpain secondary to #1- resolved. 3. Exertional dyspnea secondary to #1 and COPD.  Improved. 4. Asthma 5. Diabetes mellitus 6. Gout 7. Remote history of "carcinoid syndrome" 8. Remote history of tobacco use 9. Basal cell carcinoma removed from the right face 10. Chronic low back pain status post epidural steroid injections-followed at a pain management clinic 11. 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 Constipation secondary to narcotic analgesics and potentially lymphoma involving the GI tract-improved. 13. Erythema/induration at the mediastinoscopy incision site- resolved.  Mr. WTousleyhas completed 3 cycles of systemic therapy for treatment of high-grade B-cell lymphoma.  Restaging PET scan shows significant improvement.  He continues to have an improved performance status.  He is being admitted electively for the fourth cycle of chemotherapy with rituximab/EPOCH.  He  will also receive intrathecal prophylaxis with methotrexate during this admission.  Ned Card ANP/GNP-BC 04/13/2017, 1:55 PM   Jeffery Wood was interviewed and examined.  I reviewed the restaging PET images with him.  He has experienced clinical and radiologic improvement with 3 cycles of systemic therapy.  He appears stable to proceed with cycle 4 R-EPOCH.  He will receive intrathecal  methotrexate with this cycle.  Jeffery Wood will receive outpatient Neulasta support following the completion of chemotherapy.  Julieanne Manson, MD

## 2017-04-14 ENCOUNTER — Other Ambulatory Visit: Payer: Self-pay

## 2017-04-14 DIAGNOSIS — R350 Frequency of micturition: Secondary | ICD-10-CM

## 2017-04-14 DIAGNOSIS — M792 Neuralgia and neuritis, unspecified: Secondary | ICD-10-CM

## 2017-04-14 DIAGNOSIS — G4700 Insomnia, unspecified: Secondary | ICD-10-CM

## 2017-04-14 MED ORDER — VINCRISTINE SULFATE CHEMO INJECTION 1 MG/ML
Freq: Once | INTRAVENOUS | Status: AC
  Administered 2017-04-14: 16:00:00 via INTRAVENOUS
  Filled 2017-04-14: qty 12

## 2017-04-14 MED ORDER — TRAMADOL HCL 50 MG PO TABS: 50.0000 mg | ORAL_TABLET | Freq: Every evening | ORAL | Status: DC | PRN

## 2017-04-14 MED ORDER — TAMSULOSIN HCL 0.4 MG PO CAPS
0.4000 mg | ORAL_CAPSULE | Freq: Every day | ORAL | Status: DC
  Administered 2017-04-14 – 2017-04-15 (×2): 0.4 mg via ORAL
  Filled 2017-04-14 (×3): qty 1

## 2017-04-14 MED ORDER — SODIUM CHLORIDE 0.9 % IV SOLN
Freq: Once | INTRAVENOUS | Status: AC
  Administered 2017-04-14: 8 mg via INTRAVENOUS
  Filled 2017-04-14: qty 4

## 2017-04-14 MED ORDER — GABAPENTIN 300 MG PO CAPS
300.0000 mg | ORAL_CAPSULE | Freq: Every day | ORAL | Status: DC
  Administered 2017-04-14: 300 mg via ORAL
  Filled 2017-04-14: qty 1

## 2017-04-14 NOTE — Progress Notes (Signed)
IP PROGRESS NOTE  Subjective:  Patient is on day 2 of cycle #4 of chemoimmunotherapy for underlying diagnosis of aggressive B-cell lymphoma positive for translocation (8;12), currently receiving dose adjusted R-EPOCH.  Tolerated rituximab well yesterday without difficulties.  Main complaints include frequent urination including nocturia that interrupts his sleep significantly.  In addition, patient complains of burning neuropathy in bilateral lower extremities.  Denies nausea, vomiting, abdominal pain, diarrhea.  No shortness of breath, chest pain, palpitations, or swelling in the lower extremities.  Objective: Vital signs in last 24 hours: Blood pressure (!) 147/82, pulse 86, temperature 97.7 F (36.5 C), temperature source Oral, resp. rate 20, height 6\' 3"  (1.905 m), weight 245 lb 11.2 oz (111.4 kg), SpO2 98 %.  Intake/Output from previous day: 03/15 0701 - 03/16 0700 In: 2030 [P.O.:600; I.V.:1136.5; IV Piggyback:293.5] Out: -   Physical Exam:  Patient is alert, awake, oriented x3.  No acute distress. HEENT: Moist mucous membranes, anicteric sclerae. Lungs: Clear to auscultation bilaterally without expiratory wheezing Cardiac: S1/S2, regular, no murmurs, rubs, gallops. Abdomen: Soft, nontender nondistended.  Bowel sounds are normoactive Extremities: No lower extremity edema Portacath: without erythema  Lab Results: Recent Labs    04/13/17 0828  WBC 17.9*  HGB 13.5  HCT 40.6  PLT 268    BMET Recent Labs    04/13/17 0828  NA 137  K 4.1  CL 102  CO2 25  GLUCOSE 167*  BUN 19  CREATININE 0.63  CALCIUM 9.5    No results found for: CEA1  Studies/Results: No results found.  Medications: I have reviewed the patient's current medications.  Assessment/Plan: 62 y.o. male with high-grade B-cell lymphoma undergoing curative-intent systemic chemoimmunotherapy with dose-adjusted R-EPOCH and intrathecal methotrexate prophylaxis.  Patient is currently admitted for the fourth  cycle of systemic therapy.  Treatment was started yesterday and rituximab was tolerated without difficulties.  Main complaints at this time include frequent urination with nocturia and bilateral lower extremity neuropathy that precedes initiation of chemotherapy has been attributed by the patient to the degenerative arthropathy in the back.  **Lymphoma: --Continue systemic chemotherapy.  No change to the regimen at this time.  If neuropathy progresses, will consider reducing dose of increased in the future --Continue IV fluid supplementation --Intrathecal methotrexate by interventional radiology on 04/16/17  **Urinary frequency: Due to combination of aggressive hydration with chemotherapy as well as prostate enlargement. --Start tamsulosin 0.4mg  PO Qday  **BL LE neuropathy: --Start gabapentin 300mg  PO QHS  **Insomnia: Likely combination of urinary frequency and neuropathy.  If gabapentin is not sufficient, will consider adding trazodone 50 mg every night for sleep.  Patient is currently receiving clonazepam and I would rather avoid adding too many medications at the same time out of concern for possible side effect interactions  **Electrolytes/fluids: Patient appears to be euvolemic.  No significant electrolyte abnormalities at this point in time --Continue IV fluids with systemic chemotherapy.  **PPx: --Patient is fully ambulatory and does not require medical prophylaxis for DVT --Patient is on pantoprazole 40 mg daily for this ulcer prophylaxis in the context of high-dose steroids  **Disposition: Patient will be discharged home at completion of his therapy ministration.    LOS: 1 day   Ardath Sax, MD   04/14/2017, 10:13 AM

## 2017-04-14 NOTE — Progress Notes (Signed)
Chemotherapy dosage and calculations checked and reviewed with Kim Osborne RN. 

## 2017-04-15 DIAGNOSIS — K30 Functional dyspepsia: Secondary | ICD-10-CM

## 2017-04-15 MED ORDER — SODIUM CHLORIDE 0.9 % IV SOLN
Freq: Once | INTRAVENOUS | Status: AC
  Administered 2017-04-15: 8 mg via INTRAVENOUS
  Filled 2017-04-15: qty 4

## 2017-04-15 MED ORDER — PANTOPRAZOLE SODIUM 40 MG PO TBEC
40.0000 mg | DELAYED_RELEASE_TABLET | Freq: Two times a day (BID) | ORAL | Status: DC
  Administered 2017-04-15 – 2017-04-17 (×5): 40 mg via ORAL
  Filled 2017-04-15 (×5): qty 1

## 2017-04-15 MED ORDER — VINCRISTINE SULFATE CHEMO INJECTION 1 MG/ML
Freq: Once | INTRAVENOUS | Status: AC
  Administered 2017-04-15: 15:00:00 via INTRAVENOUS
  Filled 2017-04-15: qty 12

## 2017-04-15 MED ORDER — GABAPENTIN 300 MG PO CAPS
300.0000 mg | ORAL_CAPSULE | Freq: Three times a day (TID) | ORAL | Status: DC
  Administered 2017-04-15 – 2017-04-16 (×4): 300 mg via ORAL
  Filled 2017-04-15 (×5): qty 1

## 2017-04-15 NOTE — Progress Notes (Signed)
IP PROGRESS NOTE  Subjective:  Patient is on day 3 of cycle #4 of chemoimmunotherapy for underlying diagnosis of aggressive B-cell lymphoma positive for translocation (8;12), currently receiving dose adjusted R-EPOCH.  Tolerating chemotherapy without any significant side effects so far.  Main complaints include frequent urination including nocturia that interrupts his sleep significantly.  In addition, patient complains of burning neuropathy in bilateral lower extremities.  Denies nausea, vomiting, abdominal pain, diarrhea.  Patient did have a bout of indigestion last night, resolved spontaneously.  No shortness of breath, chest pain, palpitations, or swelling in the lower extremities.  Objective: Vital signs in last 24 hours: Blood pressure (!) 152/84, pulse 91, temperature 98.2 F (36.8 C), temperature source Oral, resp. rate 20, height 6\' 3"  (1.905 m), weight 245 lb 11.2 oz (111.4 kg), SpO2 94 %.  Intake/Output from previous day: 03/16 0701 - 03/17 0700 In: 3596.8 [P.O.:1560; I.V.:1713.8; IV Piggyback:322.9] Out: -   Physical Exam:  Patient is alert, awake, oriented x3.  No acute distress. HEENT: Moist mucous membranes, anicteric sclerae. Lungs: Clear to auscultation bilaterally without expiratory wheezing Cardiac: S1/S2, regular, no murmurs, rubs, gallops. Abdomen: Soft, nontender nondistended.  Bowel sounds are normoactive Extremities: No lower extremity edema Portacath: without erythema  Lab Results: Recent Labs    04/13/17 0828  WBC 17.9*  HGB 13.5  HCT 40.6  PLT 268    BMET Recent Labs    04/13/17 0828  NA 137  K 4.1  CL 102  CO2 25  GLUCOSE 167*  BUN 19  CREATININE 0.63  CALCIUM 9.5    No results found for: CEA1  Studies/Results: No results found.  Medications: I have reviewed the patient's current medications.  Assessment/Plan: 62 y.o. male with high-grade B-cell lymphoma undergoing curative-intent systemic chemoimmunotherapy with dose-adjusted  R-EPOCH and intrathecal methotrexate prophylaxis.  Patient is currently admitted for the fourth cycle of systemic therapy.  Treatment was started yesterday and rituximab was tolerated without difficulties.  Main complaints at this time include frequent urination with nocturia and bilateral lower extremity neuropathy that precedes initiation of chemotherapy has been attributed by the patient to the degenerative arthropathy in the back.  **Lymphoma: --Continue systemic chemotherapy.  No change to the regimen at this time.  If neuropathy progresses, will consider reducing dose of increased in the future --Continue IV fluid supplementation --Intrathecal methotrexate by interventional radiology on 04/16/17  **Urinary frequency: Due to combination of aggressive hydration with chemotherapy as well as prostate enlargement. --Continue tamsulosin 0.4mg  PO Qday  **BL LE neuropathy: --Increase gabapentin to 300mg  PO 3 TIMES daily  **Insomnia: Likely combination of urinary frequency and neuropathy.  If gabapentin is not sufficient, will consider adding trazodone 50 mg every night for sleep.  Patient is currently receiving clonazepam and I would rather avoid adding too many medications at the same time out of concern for possible side effect interactions  **Electrolytes/fluids: Patient appears to be euvolemic.  No significant electrolyte abnormalities at this point in time --Continue IV fluids with systemic chemotherapy.  **PPx: --Patient is fully ambulatory and does not require medical prophylaxis for DVT --Patient is on pantoprazole 40 mg daily for this ulcer prophylaxis in the context of high-dose steroids --will increase to twice daily dosing due to indigestion symptoms  **Disposition: Patient will be discharged home at completion of his therapy ministration.    LOS: 2 days   Ardath Sax, MD   04/15/2017, 11:28 AM

## 2017-04-16 ENCOUNTER — Inpatient Hospital Stay (HOSPITAL_COMMUNITY): Payer: BC Managed Care – PPO

## 2017-04-16 DIAGNOSIS — K5900 Constipation, unspecified: Secondary | ICD-10-CM

## 2017-04-16 LAB — COMPREHENSIVE METABOLIC PANEL
ALBUMIN: 3.3 g/dL — AB (ref 3.5–5.0)
ALK PHOS: 67 U/L (ref 38–126)
ALT: 22 U/L (ref 17–63)
ANION GAP: 10 (ref 5–15)
AST: 21 U/L (ref 15–41)
BUN: 18 mg/dL (ref 6–20)
CALCIUM: 9.4 mg/dL (ref 8.9–10.3)
CHLORIDE: 100 mmol/L — AB (ref 101–111)
CO2: 26 mmol/L (ref 22–32)
Creatinine, Ser: 0.59 mg/dL — ABNORMAL LOW (ref 0.61–1.24)
GFR calc Af Amer: >60 mL/min (ref >60)
GFR calc non Af Amer: >60 mL/min (ref >60)
GLUCOSE: 235 mg/dL — AB (ref 65–99)
Potassium: 4.1 mmol/L (ref 3.5–5.1)
SODIUM: 136 mmol/L (ref 135–145)
Total Bilirubin: 0.6 mg/dL (ref 0.3–1.2)
Total Protein: 5.9 g/dL — ABNORMAL LOW (ref 6.5–8.1)

## 2017-04-16 LAB — CBC WITH DIFFERENTIAL/PLATELET
BASOS PCT: 0 %
Basophils Absolute: 0 10*3/uL (ref 0.0–0.1)
EOS ABS: 0 10*3/uL (ref 0.0–0.7)
Eosinophils Relative: 0 %
HCT: 39.2 % (ref 39.0–52.0)
HEMOGLOBIN: 12.5 g/dL — AB (ref 13.0–17.0)
Lymphocytes Relative: 7 %
Lymphs Abs: 0.9 10*3/uL (ref 0.7–4.0)
MCH: 28.8 pg (ref 26.0–34.0)
MCHC: 31.9 g/dL (ref 30.0–36.0)
MCV: 90.3 fL (ref 78.0–100.0)
MONOS PCT: 3 %
Monocytes Absolute: 0.4 10*3/uL (ref 0.1–1.0)
NEUTROS PCT: 90 %
Neutro Abs: 11.6 10*3/uL — ABNORMAL HIGH (ref 1.7–7.7)
Platelets: 281 10*3/uL (ref 150–400)
RBC: 4.34 MIL/uL (ref 4.22–5.81)
RDW: 16.2 % — ABNORMAL HIGH (ref 11.5–15.5)
WBC: 12.9 10*3/uL — AB (ref 4.0–10.5)

## 2017-04-16 LAB — MAGNESIUM: Magnesium: 2.1 mg/dL (ref 1.7–2.4)

## 2017-04-16 MED ORDER — GABAPENTIN 300 MG PO CAPS
300.0000 mg | ORAL_CAPSULE | Freq: Once | ORAL | Status: AC
  Administered 2017-04-16: 300 mg via ORAL
  Filled 2017-04-16: qty 1

## 2017-04-16 MED ORDER — VINCRISTINE SULFATE CHEMO INJECTION 1 MG/ML
Freq: Once | INTRAVENOUS | Status: AC
  Administered 2017-04-16: 15:00:00 via INTRAVENOUS
  Filled 2017-04-16: qty 12

## 2017-04-16 MED ORDER — MEPERIDINE HCL 25 MG/ML IJ SOLN: 6.2500 mg | INTRAMUSCULAR | Status: DC | PRN

## 2017-04-16 MED ORDER — LIDOCAINE HCL 1 % IJ SOLN
INTRAMUSCULAR | Status: AC
  Filled 2017-04-16: qty 20

## 2017-04-16 MED ORDER — SORBITOL 70 % SOLN
30.0000 mL | Freq: Every day | Status: DC | PRN
  Administered 2017-04-16: 30 mL via ORAL
  Filled 2017-04-16: qty 30

## 2017-04-16 MED ORDER — HYDROMORPHONE HCL 1 MG/ML IJ SOLN: 0.2500 mg | INTRAMUSCULAR | Status: DC | PRN

## 2017-04-16 MED ORDER — PROMETHAZINE HCL 25 MG/ML IJ SOLN: 6.2500 mg | INTRAMUSCULAR | Status: DC | PRN

## 2017-04-16 MED ORDER — METHOTREXATE SODIUM CHEMO INJECTION (PF) 50 MG/2ML
Freq: Once | INTRAMUSCULAR | Status: AC
  Administered 2017-04-16: 11:00:00 via INTRATHECAL
  Filled 2017-04-16: qty 0.48

## 2017-04-16 MED ORDER — GABAPENTIN 300 MG PO CAPS: 300.0000 mg | ORAL_CAPSULE | Freq: Every day | ORAL | Status: DC

## 2017-04-16 MED ORDER — SODIUM CHLORIDE 0.9 % IV SOLN
Freq: Once | INTRAVENOUS | Status: AC
  Administered 2017-04-16: 8 mg via INTRAVENOUS
  Filled 2017-04-16: qty 4

## 2017-04-16 MED ORDER — LACTATED RINGERS IV SOLN: INTRAVENOUS | Status: DC

## 2017-04-16 NOTE — Progress Notes (Signed)
IP PROGRESS NOTE  Subjective:   Jeffery Wood reports tolerating the chemotherapy well.  No nausea.  He has burning in the feet.  This predates chemotherapy.  He reports partial relief with gabapentin.  He complains of constipation.  Objective: Vital signs in last 24 hours: Blood pressure (!) 163/86, pulse 86, temperature 98.2 F (36.8 C), temperature source Oral, resp. rate 16, height 6' 3"  (1.905 m), weight 245 lb 11.2 oz (111.4 kg), SpO2 99 %.  Intake/Output from previous day: 03/17 0701 - 03/18 0700 In: 2678.1 [P.O.:720; I.V.:1624; IV Piggyback:334.1] Out: -   Physical Exam:  HEENT: No thrush Lungs: Clear bilaterally Cardiac: Regular rate and rhythm Abdomen:, No hepatosplenomegaly Extremities: No leg edema Skin: Mediastinoscopy incision without evidence of infection  Portacath/PICC-without erythema  Lab Results: Recent Labs    04/16/17 0551  WBC 12.9*  HGB 12.5*  HCT 39.2  PLT 281    BMET Recent Labs    04/16/17 0551  NA 136  K 4.1  CL 100*  CO2 26  GLUCOSE 235*  BUN 18  CREATININE 0.59*  CALCIUM 9.4    No results found for: CEA1  Studies/Results: Dg Fluoro Guided Loc Of Needle/cath Tip For Spinal Inject Lt  Result Date: 04/16/2017 CLINICAL DATA:  62 year old male with non-Hodgkin's lymphoma. Subsequent encounter. EXAM: DIAGNOSTIC LUMBAR PUNCTURE UNDER FLUOROSCOPIC GUIDANCE FLUOROSCOPY TIME:  Fluoroscopy Time:  12 seconds. Radiation Exposure Index: 5.44 mGy. PROCEDURE: Order checked. The procedure and associated risks (including but not limited to headache, bleeding, infection, drug reaction) discussed with the patient. Questions answered. Written consent obtained. Patient 1 week post therapeutic injection. Time-out performed. Under fluoroscopic guidance and aseptic technique, an L5-S1 lumbar puncture was performed with a 22 gauge spinal needle. 3 cc of cerebral spinal fluid collected (not sent for labs as per request). Opening pressure not performed. Adequate  flow. 12 mg methotrexate with 50 mg of hydrocortisone (prepared by pharmacy) with slowly instilled. Postprocedure instructions were reviewed with the patient. No immediate complications. IMPRESSION: Intrathecal methotrexate instilled at the L5-S1 level. Electronically Signed   By: Genia Del M.D.   On: 04/16/2017 12:07    Medications: I have reviewed the patient's current medications.  Assessment/Plan:  1. High-grade B-cell non-Hodgkin's lymphoma, molecular studies pending, clinical stage IV, IPI-low intermediate risk, FISH panel revealed an 8q24/14q14fsion (MYC/IgH), negative for BCL 6 and BCL-2  Chest x-ray 01/15/2017-medial right upper lobe/perihilar mass.   Chest CT 01/19/2017-mass constricting the superior vena cava, subcarinal adenopathy, right suprahilar/mediastinal mass.   12/26/2018status postbronchoscopy with transbronchial biopsy of level 7node and biopsy of aright upper lobe lung mass by Dr. GServando Snare There was compression of the right mainstem bronchus. A mass was noted in the right upper lobe. Mediastinal lymph nodes were seen on EBUS. Multiple biopsies of a level 7 node were obtained. Brushings and a biopsy of the right upper lobe mass were obtained. 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 Final pathology was nondiagnostic.   PET scan on 02/02/2017 showed hypermetabolism corresponding to the right sided mediastinal mass,progressive since the CT 01/19/2017. Metastatic disease/lymphoma within the bones, pericardium, stomach, bowel and abdominopelvic nodal stations.   Brain MRI 02/03/2017 showed no evidence of intracranial metastases or acute abnormality.   1/7/2019status postmediastinoscopy.  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 Molecular studies are pending.  Rituximab 02/08/2017  Cycle 1 CHOP 02/09/2017  Cycle 1 R-EPOCH 03/02/2017  Cycle2 R-EPOCH2/22/2019  Prophylactic intrathecal methotrexate  03/27/2017  Restaging PET scan 04/11/2017-significant decrease in FDG uptake  associated with the chest, abdomen and pelvis and axial and proximal appendicular skeleton.  No new foci of abnormal radiotracer uptake identified.  Cycle 3 R-EPOCH 04/13/2017  Prophylactic intrathecal methotrexate 04/16/2017 2. Chest/upper backand right armpain secondary to #1- resolved. 3. Exertional dyspnea secondary to #1 and COPD.  Improved. 4. Asthma 5. Diabetes mellitus 6. Gout 7. Remote history of "carcinoid syndrome" 8. Remote history of tobacco use 9. Basal cell carcinoma removed from the right face 10. Chronic low back pain status post epidural steroid injections-followed at a pain management clinic 11. 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 Constipation secondary to narcotic analgesics and potentially lymphoma involving the GI tract-improved. 13. Erythema/induration at the mediastinoscopy incision site- resolved.  Jeffery Wood appears to be tolerating the chemotherapy well.  He will complete day 4 of infusional chemotherapy beginning today.  He will receive Cytoxan on 04/17/2017.  Jeffery Wood is scheduled for intrathecal methotrexate today.  We will add sorbitol for constipation.      LOS: 3 days   Betsy Coder, MD   04/16/2017, 1:38 PM

## 2017-04-16 NOTE — Progress Notes (Signed)
Inpatient Diabetes Program Recommendations  AACE/ADA: New Consensus Statement on Inpatient Glycemic Control (2015)  Target Ranges:  Prepandial:   less than 140 mg/dL      Peak postprandial:   less than 180 mg/dL (1-2 hours)      Critically ill patients:  140 - 180 mg/dL   Results for Jeffery Wood, Jeffery Wood (MRN [id number]) as of 04/16/2017 15:20  Ref. Range 04/16/2017 05:51  Glucose Latest Ref Range: 65 - 99 mg/dL 235 (H)    Admit: Non-Hodgkin's lymphoma  History: DM  Home DM Meds: Metformin 1000 mg BID + Amaryl 2 mg BID  Current Orders: Metformin 1000 mg BID + Amaryl 2 mg BID     MD- Please consider the following in-hospital insulin adjustments:  Start Novolog Moderate Correction Scale/ SSI (0-15 units) TID AC + HS  (Use Glycemic Control Order set)       --Will follow patient during hospitalization--  Wyn Quaker RN, MSN, CDE Diabetes Coordinator Inpatient Glycemic Control Team Team Pager: [phone] (8a-5p)

## 2017-04-16 NOTE — Progress Notes (Signed)
Chemo dosages and dilutions verified by 2 chemo RNs 

## 2017-04-16 NOTE — Procedures (Signed)
Order checked.  Procedure and associated risks reviewed with patient.  Questions answered.  Written consent obtained. Time out performed.  Under fluoroscopic guidance and aseptic technique, an L5-S1 LP was performed with a single pass of a  22 g spinal needle. 3 cc of CSF collected (no labs requested).  OP not performed.  Adequate flow.  12 mg methotrexate with 50 mg of hydrocortisone (preparred by pharmacy) slowly instilled.  No immediate complications.  Reviewed post procedure instructions.

## 2017-04-16 NOTE — Progress Notes (Signed)
Nutrition Brief Note  Patient identified on the Malnutrition Screening Tool (MST) Report  Patient's weight is stable. Eating well, 100%. Family has been bringing in meals for patient.  Wt Readings from Last 15 Encounters:  04/13/17 245 lb 11.2 oz (111.4 kg)  03/24/17 254 lb 6.4 oz (115.4 kg)  03/23/17 249 lb 4.8 oz (113.1 kg)  03/12/17 245 lb 12.8 oz (111.5 kg)  03/01/17 248 lb 4.8 oz (112.6 kg)  02/27/17 242 lb (109.8 kg)  02/23/17 242 lb 8 oz (110 kg)  02/16/17 252 lb 9.6 oz (114.6 kg)  02/12/17 263 lb 6.4 oz (119.5 kg)  02/07/17 256 lb 6.3 oz (116.3 kg)  02/05/17 254 lb (115.2 kg)  01/31/17 254 lb 1.6 oz (115.3 kg)  01/24/17 244 lb (110.7 kg)  01/22/17 244 lb (110.7 kg)  01/19/17 253 lb 4.8 oz (114.9 kg)    Body mass index is 30.71 kg/m. Patient meets criteria for obesity based on current BMI.   Current diet order is regular, patient is consuming approximately 100% of meals at this time. Labs and medications reviewed.   No nutrition interventions warranted at this time. If nutrition issues arise, please consult RD.   Clayton Bibles, MS, RD, [place] Dietitian Pager: [phone] After Hours Pager: [phone]

## 2017-04-17 DIAGNOSIS — G629 Polyneuropathy, unspecified: Secondary | ICD-10-CM

## 2017-04-17 MED ORDER — SODIUM CHLORIDE 0.9 % IV SOLN
750.0000 mg/m2 | Freq: Once | INTRAVENOUS | Status: AC
  Administered 2017-04-17: 1840 mg via INTRAVENOUS
  Filled 2017-04-17: qty 92

## 2017-04-17 MED ORDER — SODIUM CHLORIDE 0.9 % IV SOLN
Freq: Once | INTRAVENOUS | Status: AC
  Administered 2017-04-17: 16 mg via INTRAVENOUS
  Filled 2017-04-17: qty 8

## 2017-04-17 MED ORDER — GABAPENTIN 300 MG PO CAPS: 300.0000 mg | ORAL_CAPSULE | Freq: Every day | ORAL | 1 refills | Status: DC

## 2017-04-17 MED ORDER — HEPARIN SOD (PORK) LOCK FLUSH 100 UNIT/ML IV SOLN
500.0000 [IU] | Freq: Once | INTRAVENOUS | Status: DC
  Filled 2017-04-17: qty 5

## 2017-04-17 MED ORDER — SORBITOL 70 % SOLN: 30.0000 mL | Freq: Every day | Status: DC | PRN

## 2017-04-17 NOTE — Discharge Instructions (Signed)
Call for fever, chills, other signs of infection, bleeding

## 2017-04-17 NOTE — Discharge Summary (Signed)
Physician Discharge Summary  Patient ID: Jeffery Wood  MRN: [id number] DOB/AGE: Dec 09, 1955 62 y.o.  Admit date: 04/13/2017 Discharge date: 04/17/2017    Discharge Diagnoses:  Active Problems:   NHL (non-Hodgkin's lymphoma) (HCC)   Urinary frequency   Neuropathic pain   Discharged Condition: Stable for discharge home  Discharge Labs: Hemoglobin 12.5, Ruben count 12.9, absolute neutrophil count 11.6, platelet count 281,000, sodium 136, potassium 4.1, BUN 18, creatinine 0.59  Significant Diagnostic Studies: None  Consults: None  Procedures:  1. Infusional chemotherapy 2. Intrathecal chemotherapy  Disposition: Discharge disposition: 01-Home or Self Care     Home   Allergies as of 04/17/2017   No Known Allergies     Medication List    STOP taking these medications   mupirocin cream 2 % Commonly known as:  BACTROBAN     TAKE these medications   acetaminophen 650 MG CR tablet Commonly known as:  TYLENOL Take 1,300 mg by mouth 2 (two) times daily as needed for pain.   albuterol (2.5 MG/3ML) 0.083% nebulizer solution Commonly known as:  PROVENTIL Take 2.5 mg by nebulization at bedtime as needed for wheezing or shortness of breath.   aspirin EC 81 MG tablet Take 81 mg by mouth daily.   betamethasone dipropionate 0.05 % cream Commonly known as:  DIPROLENE Apply 1 application topically daily as needed (for psoriasis).   celecoxib 200 MG capsule Commonly known as:  CELEBREX Take 200 mg by mouth 2 (two) times daily.   clonazePAM 0.5 MG tablet Commonly known as:  KLONOPIN Take 0.5 mg by mouth at bedtime.   etodolac 400 MG tablet Commonly known as:  LODINE Take 400 mg by mouth 3 (three) times daily as needed (gout pain).   Fluticasone-Salmeterol 100-50 MCG/DOSE Aepb Commonly known as:  ADVAIR Inhale 1 puff into the lungs daily.   gabapentin 300 MG capsule Commonly known as:  NEURONTIN Take 1 capsule (300 mg total) by mouth at bedtime.   glimepiride  2 MG tablet Commonly known as:  AMARYL Take 2 mg by mouth 2 (two) times daily before a meal.   HYDROcodone-acetaminophen 5-325 MG tablet Commonly known as:  NORCO/VICODIN Take 1 tablet by mouth every 6 (six) hours as needed for moderate pain or severe pain.   levalbuterol 45 MCG/ACT inhaler Commonly known as:  XOPENEX HFA Inhale 2 puffs into the lungs daily as needed for wheezing or shortness of breath.   lidocaine-prilocaine cream Commonly known as:  EMLA Apply to port site one hour prior to use. Do not rub in. Cover with plastic.   loratadine 10 MG tablet Commonly known as:  CLARITIN Take 10 mg by mouth daily as needed for allergies.   losartan 100 MG tablet Commonly known as:  COZAAR Take 100 mg by mouth at bedtime.   metFORMIN 1000 MG tablet Commonly known as:  GLUCOPHAGE Take 1,000 mg by mouth 2 (two) times daily with a meal.   multivitamins ther. w/minerals Tabs tablet Take 2 tablets by mouth daily.   omeprazole 20 MG capsule Commonly known as:  PRILOSEC Take 20 mg by mouth 2 (two) times daily.   oxyCODONE 5 MG immediate release tablet Commonly known as:  Oxy IR/ROXICODONE Take 1 tablet (5 mg total) by mouth every 4 (four) hours as needed for severe pain.   polyethylene glycol packet Commonly known as:  MIRALAX Take 17 g by mouth daily. What changed:    when to take this  reasons to take this   prochlorperazine 10  MG tablet Commonly known as:  COMPAZINE Take 1 tablet (10 mg total) by mouth every 6 (six) hours as needed for nausea or vomiting.   senna 8.6 MG Tabs tablet Commonly known as:  SENOKOT Take 1 tablet (8.6 mg total) by mouth 2 (two) times daily.   tiotropium 18 MCG inhalation capsule Commonly known as:  SPIRIVA Place 18 mcg into inhaler and inhale daily.   traMADol 50 MG tablet Commonly known as:  ULTRAM Take 50 mg by mouth at bedtime as needed for moderate pain.   trolamine salicylate 10 % cream Commonly known as:  ASPERCREME Apply 1  application topically 2 (two) times daily as needed for muscle pain.       Follow-up Information    Ladell Pier, MD Follow up.   Specialty:  Oncology Why:  04/19/2017 Neulasta injection; 04/25/2017 lab; 05/04/2017 follow-up appointment Contact information: [place] [address] [phone]           Hospital Course: [name] is a 62 year old man with non-Hodgkin's lymphoma.  Restaging PET 04/11/2017 scan showed improvement.  He was admitted electively 04/13/2017 to complete cycle 4 of systemic therapy with rituximab/EPOCH.  He completed infusional chemotherapy beginning 04/13/2017 and ending on 04/17/2017.  He received cyclophosphamide prior to discharge on 04/17/2017.  He received cycle 2 prophylactic intrathecal methotrexate on 04/16/2017.  He tolerated the chemotherapy without significant acute toxicity.  [name] has chronic "burning" in the feet.  This predated the start of chemotherapy.  He was started on a trial of gabapentin during the hospitalization.  He noted partial improvement.  He will be discharged home on gabapentin 300 mg at bedtime.  [name] appears stable for discharge at the completion of chemotherapy 04/17/2017.  He will return to the [facility] for Neulasta on 04/19/2017 and a lab appointment on 04/25/2017.   Discharge Instructions    Discharge patient   Complete by:  As directed    Discharge disposition:  01-Home or Self Care   Discharge patient date:  04/17/2017      Signed: Ned Card 04/17/2017, 2:06 PM

## 2017-04-17 NOTE — Progress Notes (Signed)
Cytoxan dosage and calculations checked with Laural Benes RN.

## 2017-04-19 ENCOUNTER — Inpatient Hospital Stay: Payer: BC Managed Care – PPO

## 2017-04-19 VITALS — BP 135/77 | HR 88 | Temp 98.1°F | Resp 18

## 2017-04-19 DIAGNOSIS — C8330 Diffuse large B-cell lymphoma, unspecified site: Secondary | ICD-10-CM

## 2017-04-19 DIAGNOSIS — C8590 Non-Hodgkin lymphoma, unspecified, unspecified site: Secondary | ICD-10-CM | POA: Diagnosis present

## 2017-04-19 MED ORDER — PEGFILGRASTIM INJECTION 6 MG/0.6ML ~~LOC~~
PREFILLED_SYRINGE | SUBCUTANEOUS | Status: AC
  Filled 2017-04-19: qty 0.6

## 2017-04-19 MED ORDER — PEGFILGRASTIM INJECTION 6 MG/0.6ML ~~LOC~~
6.0000 mg | PREFILLED_SYRINGE | Freq: Once | SUBCUTANEOUS | Status: AC
  Administered 2017-04-19: 6 mg via SUBCUTANEOUS

## 2017-04-26 ENCOUNTER — Inpatient Hospital Stay: Payer: BC Managed Care – PPO

## 2017-04-26 DIAGNOSIS — C8590 Non-Hodgkin lymphoma, unspecified, unspecified site: Secondary | ICD-10-CM | POA: Diagnosis not present

## 2017-04-26 LAB — CMP (CANCER CENTER ONLY)
ALT: 41 U/L (ref 0–55)
ANION GAP: 9 (ref 3–11)
AST: 21 U/L (ref 5–34)
Albumin: 3.3 g/dL — ABNORMAL LOW (ref 3.5–5.0)
Alkaline Phosphatase: 113 U/L (ref 40–150)
BILIRUBIN TOTAL: 0.4 mg/dL (ref 0.2–1.2)
BUN: 7 mg/dL (ref 7–26)
CO2: 26 mmol/L (ref 22–29)
Calcium: 9.8 mg/dL (ref 8.4–10.4)
Chloride: 104 mmol/L (ref 98–109)
Creatinine: 0.71 mg/dL (ref 0.70–1.30)
GFR, Est AFR Am: >60 mL/min (ref >60)
Glucose, Bld: 168 mg/dL — ABNORMAL HIGH (ref 70–140)
POTASSIUM: 4 mmol/L (ref 3.5–5.1)
Sodium: 139 mmol/L (ref 136–145)
TOTAL PROTEIN: 6.2 g/dL — AB (ref 6.4–8.3)

## 2017-04-26 LAB — LACTATE DEHYDROGENASE: LDH: 180 U/L (ref 125–245)

## 2017-04-26 LAB — CBC WITH DIFFERENTIAL (CANCER CENTER ONLY)
Basophils Absolute: 0.1 10*3/uL (ref 0.0–0.1)
Basophils Relative: 1 %
EOS PCT: 1 %
Eosinophils Absolute: 0.1 10*3/uL (ref 0.0–0.5)
HCT: 38.3 % — ABNORMAL LOW (ref 38.4–49.9)
Hemoglobin: 12.8 g/dL — ABNORMAL LOW (ref 13.0–17.1)
LYMPHS ABS: 0.9 10*3/uL (ref 0.9–3.3)
LYMPHS PCT: 13 %
MCH: 29.7 pg (ref 27.2–33.4)
MCHC: 33.3 g/dL (ref 32.0–36.0)
MCV: 89.2 fL (ref 79.3–98.0)
MONO ABS: 1 10*3/uL — AB (ref 0.1–0.9)
Monocytes Relative: 14 %
Neutro Abs: 5.3 10*3/uL (ref 1.5–6.5)
Neutrophils Relative %: 71 %
PLATELETS: 146 10*3/uL (ref 140–400)
RBC: 4.29 MIL/uL (ref 4.20–5.82)
RDW: 17.5 % — AB (ref 11.0–14.6)
WBC Count: 7.5 10*3/uL (ref 4.0–10.3)

## 2017-05-03 ENCOUNTER — Telehealth: Payer: Self-pay | Admitting: Oncology

## 2017-05-03 ENCOUNTER — Telehealth: Payer: Self-pay | Admitting: *Deleted

## 2017-05-03 NOTE — Telephone Encounter (Signed)
Call from pt asking when he should report to [facility] for chemo admission. Informed him patient placement will call when the room is ready. He voiced understanding.

## 2017-05-03 NOTE — Telephone Encounter (Signed)
Scheduled appt per 4/4 sch message - left message with appt date and time.

## 2017-05-04 ENCOUNTER — Inpatient Hospital Stay (HOSPITAL_COMMUNITY)
Admission: RE | Admit: 2017-05-04 | Discharge: 2017-05-08 | DRG: 847 | Disposition: A | Discharge status: Home / Self Care | Payer: BC Managed Care – PPO | Source: Ambulatory Visit | Attending: Oncology | Admitting: Oncology

## 2017-05-04 ENCOUNTER — Other Ambulatory Visit: Payer: Self-pay

## 2017-05-04 ENCOUNTER — Ambulatory Visit: Payer: BC Managed Care – PPO | Admitting: Nurse Practitioner

## 2017-05-04 ENCOUNTER — Encounter (HOSPITAL_COMMUNITY): Payer: Self-pay

## 2017-05-04 ENCOUNTER — Inpatient Hospital Stay (HOSPITAL_COMMUNITY): Payer: BC Managed Care – PPO

## 2017-05-04 DIAGNOSIS — Z7984 Long term (current) use of oral hypoglycemic drugs: Secondary | ICD-10-CM | POA: Diagnosis not present

## 2017-05-04 DIAGNOSIS — Z7951 Long term (current) use of inhaled steroids: Secondary | ICD-10-CM | POA: Diagnosis not present

## 2017-05-04 DIAGNOSIS — R402413 Glasgow coma scale score 13-15, at hospital admission: Secondary | ICD-10-CM | POA: Diagnosis present

## 2017-05-04 DIAGNOSIS — M546 Pain in thoracic spine: Secondary | ICD-10-CM | POA: Diagnosis present

## 2017-05-04 DIAGNOSIS — J449 Chronic obstructive pulmonary disease, unspecified: Secondary | ICD-10-CM

## 2017-05-04 DIAGNOSIS — G893 Neoplasm related pain (acute) (chronic): Secondary | ICD-10-CM | POA: Diagnosis not present

## 2017-05-04 DIAGNOSIS — M545 Low back pain: Secondary | ICD-10-CM | POA: Diagnosis present

## 2017-05-04 DIAGNOSIS — Z7982 Long term (current) use of aspirin: Secondary | ICD-10-CM

## 2017-05-04 DIAGNOSIS — C8590 Non-Hodgkin lymphoma, unspecified, unspecified site: Secondary | ICD-10-CM | POA: Diagnosis present

## 2017-05-04 DIAGNOSIS — G8929 Other chronic pain: Secondary | ICD-10-CM | POA: Diagnosis present

## 2017-05-04 DIAGNOSIS — Z791 Long term (current) use of non-steroidal anti-inflammatories (NSAID): Secondary | ICD-10-CM

## 2017-05-04 DIAGNOSIS — I871 Compression of vein: Secondary | ICD-10-CM | POA: Diagnosis present

## 2017-05-04 DIAGNOSIS — R0609 Other forms of dyspnea: Secondary | ICD-10-CM | POA: Diagnosis present

## 2017-05-04 DIAGNOSIS — M79601 Pain in right arm: Secondary | ICD-10-CM | POA: Diagnosis present

## 2017-05-04 DIAGNOSIS — K449 Diaphragmatic hernia without obstruction or gangrene: Secondary | ICD-10-CM | POA: Diagnosis present

## 2017-05-04 DIAGNOSIS — K5903 Drug induced constipation: Secondary | ICD-10-CM

## 2017-05-04 DIAGNOSIS — C8330 Diffuse large B-cell lymphoma, unspecified site: Secondary | ICD-10-CM

## 2017-05-04 DIAGNOSIS — G4730 Sleep apnea, unspecified: Secondary | ICD-10-CM | POA: Diagnosis present

## 2017-05-04 DIAGNOSIS — K219 Gastro-esophageal reflux disease without esophagitis: Secondary | ICD-10-CM | POA: Diagnosis present

## 2017-05-04 DIAGNOSIS — R350 Frequency of micturition: Secondary | ICD-10-CM | POA: Diagnosis present

## 2017-05-04 DIAGNOSIS — R0789 Other chest pain: Secondary | ICD-10-CM | POA: Diagnosis present

## 2017-05-04 DIAGNOSIS — M109 Gout, unspecified: Secondary | ICD-10-CM | POA: Diagnosis present

## 2017-05-04 DIAGNOSIS — E119 Type 2 diabetes mellitus without complications: Secondary | ICD-10-CM

## 2017-05-04 DIAGNOSIS — I10 Essential (primary) hypertension: Secondary | ICD-10-CM | POA: Diagnosis present

## 2017-05-04 DIAGNOSIS — R0600 Dyspnea, unspecified: Secondary | ICD-10-CM

## 2017-05-04 DIAGNOSIS — Z86012 Personal history of benign carcinoid tumor: Secondary | ICD-10-CM

## 2017-05-04 DIAGNOSIS — M6281 Muscle weakness (generalized): Secondary | ICD-10-CM | POA: Diagnosis present

## 2017-05-04 DIAGNOSIS — Z85828 Personal history of other malignant neoplasm of skin: Secondary | ICD-10-CM

## 2017-05-04 DIAGNOSIS — C8512 Unspecified B-cell lymphoma, intrathoracic lymph nodes: Secondary | ICD-10-CM | POA: Diagnosis present

## 2017-05-04 DIAGNOSIS — R0602 Shortness of breath: Secondary | ICD-10-CM | POA: Diagnosis not present

## 2017-05-04 DIAGNOSIS — Z87891 Personal history of nicotine dependence: Secondary | ICD-10-CM

## 2017-05-04 DIAGNOSIS — J45909 Unspecified asthma, uncomplicated: Secondary | ICD-10-CM | POA: Diagnosis not present

## 2017-05-04 DIAGNOSIS — T40605A Adverse effect of unspecified narcotics, initial encounter: Secondary | ICD-10-CM | POA: Diagnosis present

## 2017-05-04 DIAGNOSIS — C8510 Unspecified B-cell lymphoma, unspecified site: Secondary | ICD-10-CM

## 2017-05-04 DIAGNOSIS — M1990 Unspecified osteoarthritis, unspecified site: Secondary | ICD-10-CM | POA: Diagnosis present

## 2017-05-04 DIAGNOSIS — Z79899 Other long term (current) drug therapy: Secondary | ICD-10-CM | POA: Diagnosis not present

## 2017-05-04 DIAGNOSIS — K5900 Constipation, unspecified: Secondary | ICD-10-CM | POA: Diagnosis not present

## 2017-05-04 DIAGNOSIS — R351 Nocturia: Secondary | ICD-10-CM | POA: Diagnosis present

## 2017-05-04 DIAGNOSIS — R51 Headache: Secondary | ICD-10-CM | POA: Diagnosis present

## 2017-05-04 DIAGNOSIS — Z95828 Presence of other vascular implants and grafts: Secondary | ICD-10-CM

## 2017-05-04 DIAGNOSIS — Z5111 Encounter for antineoplastic chemotherapy: Principal | ICD-10-CM

## 2017-05-04 DIAGNOSIS — R200 Anesthesia of skin: Secondary | ICD-10-CM | POA: Diagnosis present

## 2017-05-04 LAB — COMPREHENSIVE METABOLIC PANEL
ALT: 20 U/L (ref 17–63)
AST: 23 U/L (ref 15–41)
Albumin: 3.5 g/dL (ref 3.5–5.0)
Alkaline Phosphatase: 81 U/L (ref 38–126)
Anion gap: 10 (ref 5–15)
BUN: 15 mg/dL (ref 6–20)
CALCIUM: 9.2 mg/dL (ref 8.9–10.3)
CO2: 25 mmol/L (ref 22–32)
CREATININE: 0.6 mg/dL — AB (ref 0.61–1.24)
Chloride: 105 mmol/L (ref 101–111)
GLUCOSE: 154 mg/dL — AB (ref 65–99)
Potassium: 3.9 mmol/L (ref 3.5–5.1)
Sodium: 140 mmol/L (ref 135–145)
Total Bilirubin: 0.5 mg/dL (ref 0.3–1.2)
Total Protein: 6.3 g/dL — ABNORMAL LOW (ref 6.5–8.1)

## 2017-05-04 LAB — CBC WITH DIFFERENTIAL/PLATELET
BASOS PCT: 1 %
Basophils Absolute: 0.1 10*3/uL (ref 0.0–0.1)
EOS PCT: 1 %
Eosinophils Absolute: 0.1 10*3/uL (ref 0.0–0.7)
HCT: 39.2 % (ref 39.0–52.0)
Hemoglobin: 12.8 g/dL — ABNORMAL LOW (ref 13.0–17.0)
Lymphocytes Relative: 9 %
Lymphs Abs: 0.8 10*3/uL (ref 0.7–4.0)
MCH: 29.8 pg (ref 26.0–34.0)
MCHC: 32.7 g/dL (ref 30.0–36.0)
MCV: 91.4 fL (ref 78.0–100.0)
MONO ABS: 1 10*3/uL (ref 0.1–1.0)
MONOS PCT: 10 %
Neutro Abs: 7.7 10*3/uL (ref 1.7–7.7)
Neutrophils Relative %: 79 %
PLATELETS: 188 10*3/uL (ref 150–400)
RBC: 4.29 MIL/uL (ref 4.22–5.81)
RDW: 16.8 % — AB (ref 11.5–15.5)
WBC: 9.7 10*3/uL (ref 4.0–10.5)

## 2017-05-04 LAB — LACTATE DEHYDROGENASE: LDH: 116 U/L (ref 98–192)

## 2017-05-04 LAB — URIC ACID: URIC ACID, SERUM: 5.2 mg/dL (ref 4.4–7.6)

## 2017-05-04 MED ORDER — DIPHENHYDRAMINE HCL 50 MG/ML IJ SOLN: 50.0000 mg | Freq: Once | INTRAMUSCULAR | Status: DC | PRN

## 2017-05-04 MED ORDER — HOT PACK MISC ONCOLOGY
1.0000 | Freq: Once | Status: DC | PRN
  Filled 2017-05-04: qty 1

## 2017-05-04 MED ORDER — MOMETASONE FURO-FORMOTEROL FUM 100-5 MCG/ACT IN AERO
2.0000 | INHALATION_SPRAY | Freq: Two times a day (BID) | RESPIRATORY_TRACT | Status: DC
  Administered 2017-05-04 – 2017-05-07 (×7): 2 via RESPIRATORY_TRACT
  Filled 2017-05-04 (×2): qty 8.8

## 2017-05-04 MED ORDER — PANTOPRAZOLE SODIUM 40 MG PO TBEC
40.0000 mg | DELAYED_RELEASE_TABLET | Freq: Every day | ORAL | Status: DC
  Administered 2017-05-04 – 2017-05-08 (×6): 40 mg via ORAL
  Filled 2017-05-04 (×5): qty 1

## 2017-05-04 MED ORDER — ALTEPLASE 2 MG IJ SOLR
2.0000 mg | Freq: Once | INTRAMUSCULAR | Status: DC | PRN
  Filled 2017-05-04: qty 2

## 2017-05-04 MED ORDER — LORATADINE 10 MG PO TABS: 10.0000 mg | ORAL_TABLET | Freq: Every day | ORAL | Status: DC | PRN

## 2017-05-04 MED ORDER — HEPARIN SOD (PORK) LOCK FLUSH 100 UNIT/ML IV SOLN
500.0000 [IU] | Freq: Once | INTRAVENOUS | Status: DC | PRN
  Filled 2017-05-04: qty 5

## 2017-05-04 MED ORDER — HEPARIN SOD (PORK) LOCK FLUSH 100 UNIT/ML IV SOLN: 250.0000 [IU] | Freq: Once | INTRAVENOUS | Status: DC | PRN

## 2017-05-04 MED ORDER — SENNA 8.6 MG PO TABS
1.0000 | ORAL_TABLET | Freq: Two times a day (BID) | ORAL | Status: DC
  Administered 2017-05-05 – 2017-05-08 (×6): 8.6 mg via ORAL
  Filled 2017-05-04 (×8): qty 1

## 2017-05-04 MED ORDER — SODIUM CHLORIDE 0.9 % IV SOLN
Freq: Once | INTRAVENOUS | Status: AC
  Administered 2017-05-04: 8 mg via INTRAVENOUS
  Filled 2017-05-04: qty 4

## 2017-05-04 MED ORDER — SODIUM CHLORIDE 0.9% FLUSH: 3.0000 mL | INTRAVENOUS | Status: DC | PRN

## 2017-05-04 MED ORDER — SODIUM CHLORIDE 0.9 % IV SOLN: INTRAVENOUS | Status: DC

## 2017-05-04 MED ORDER — EPINEPHRINE PF 1 MG/ML IJ SOLN
0.5000 mg | Freq: Once | INTRAMUSCULAR | Status: DC | PRN
  Filled 2017-05-04: qty 1

## 2017-05-04 MED ORDER — DIPHENHYDRAMINE HCL 50 MG PO CAPS
50.0000 mg | ORAL_CAPSULE | Freq: Once | ORAL | Status: AC
  Administered 2017-05-04: 50 mg via ORAL
  Filled 2017-05-04: qty 1

## 2017-05-04 MED ORDER — POLYETHYLENE GLYCOL 3350 17 G PO PACK
17.0000 g | PACK | Freq: Every day | ORAL | Status: DC | PRN
  Administered 2017-05-05: 17 g via ORAL
  Filled 2017-05-04: qty 1

## 2017-05-04 MED ORDER — ALBUTEROL SULFATE (2.5 MG/3ML) 0.083% IN NEBU: 2.5000 mg | INHALATION_SOLUTION | Freq: Once | RESPIRATORY_TRACT | Status: DC | PRN

## 2017-05-04 MED ORDER — METFORMIN HCL 500 MG PO TABS
1000.0000 mg | ORAL_TABLET | Freq: Two times a day (BID) | ORAL | Status: DC
  Administered 2017-05-04 – 2017-05-08 (×8): 1000 mg via ORAL
  Filled 2017-05-04 (×8): qty 2

## 2017-05-04 MED ORDER — ALBUTEROL SULFATE (2.5 MG/3ML) 0.083% IN NEBU: 2.5000 mg | INHALATION_SOLUTION | Freq: Every evening | RESPIRATORY_TRACT | Status: DC | PRN

## 2017-05-04 MED ORDER — SODIUM CHLORIDE 0.9% FLUSH: 10.0000 mL | INTRAVENOUS | Status: DC | PRN

## 2017-05-04 MED ORDER — ACETAMINOPHEN 325 MG PO TABS: 650.0000 mg | ORAL_TABLET | Freq: Once | ORAL | Status: DC

## 2017-05-04 MED ORDER — SODIUM CHLORIDE 0.9 % IV SOLN
Freq: Once | INTRAVENOUS | Status: AC
  Administered 2017-05-04: 13:00:00 via INTRAVENOUS

## 2017-05-04 MED ORDER — ACETAMINOPHEN 325 MG PO TABS
650.0000 mg | ORAL_TABLET | Freq: Once | ORAL | Status: AC
  Administered 2017-05-04: 650 mg via ORAL
  Filled 2017-05-04: qty 2

## 2017-05-04 MED ORDER — OXYCODONE HCL 5 MG PO TABS: 5.0000 mg | ORAL_TABLET | ORAL | Status: DC | PRN

## 2017-05-04 MED ORDER — TIOTROPIUM BROMIDE MONOHYDRATE 18 MCG IN CAPS
18.0000 ug | ORAL_CAPSULE | Freq: Every day | RESPIRATORY_TRACT | Status: DC
  Administered 2017-05-05 – 2017-05-07 (×3): 18 ug via RESPIRATORY_TRACT
  Filled 2017-05-04: qty 5

## 2017-05-04 MED ORDER — SODIUM CHLORIDE 0.9 % IV SOLN: Freq: Once | INTRAVENOUS | Status: DC | PRN

## 2017-05-04 MED ORDER — ASPIRIN EC 81 MG PO TBEC
81.0000 mg | DELAYED_RELEASE_TABLET | Freq: Every day | ORAL | Status: DC
  Administered 2017-05-05 – 2017-05-08 (×4): 81 mg via ORAL
  Filled 2017-05-04 (×4): qty 1

## 2017-05-04 MED ORDER — LOSARTAN POTASSIUM 50 MG PO TABS
100.0000 mg | ORAL_TABLET | Freq: Every day | ORAL | Status: DC
  Administered 2017-05-04 – 2017-05-07 (×4): 100 mg via ORAL
  Filled 2017-05-04 (×4): qty 2

## 2017-05-04 MED ORDER — SODIUM CHLORIDE 0.9 % IV SOLN
375.0000 mg/m2 | Freq: Once | INTRAVENOUS | Status: AC
  Administered 2017-05-04: 900 mg via INTRAVENOUS
  Filled 2017-05-04: qty 50

## 2017-05-04 MED ORDER — EPINEPHRINE PF 1 MG/10ML IJ SOSY: 0.2500 mg | PREFILLED_SYRINGE | Freq: Once | INTRAMUSCULAR | Status: DC | PRN

## 2017-05-04 MED ORDER — GABAPENTIN 300 MG PO CAPS
300.0000 mg | ORAL_CAPSULE | Freq: Every day | ORAL | Status: DC
  Administered 2017-05-04 – 2017-05-07 (×4): 300 mg via ORAL
  Filled 2017-05-04 (×4): qty 1

## 2017-05-04 MED ORDER — METHYLPREDNISOLONE SODIUM SUCC 125 MG IJ SOLR: 125.0000 mg | Freq: Once | INTRAMUSCULAR | Status: DC | PRN

## 2017-05-04 MED ORDER — DIPHENHYDRAMINE HCL 50 MG PO CAPS: 50.0000 mg | ORAL_CAPSULE | Freq: Once | ORAL | Status: AC

## 2017-05-04 MED ORDER — DIPHENHYDRAMINE HCL 50 MG/ML IJ SOLN: 25.0000 mg | Freq: Once | INTRAMUSCULAR | Status: DC | PRN

## 2017-05-04 MED ORDER — PREDNISONE 50 MG PO TABS
75.0000 mg | ORAL_TABLET | Freq: Two times a day (BID) | ORAL | Status: DC
  Administered 2017-05-04 – 2017-05-08 (×9): 75 mg via ORAL
  Filled 2017-05-04 (×9): qty 1

## 2017-05-04 MED ORDER — HYDROCODONE-ACETAMINOPHEN 5-325 MG PO TABS
1.0000 | ORAL_TABLET | Freq: Four times a day (QID) | ORAL | Status: DC | PRN
  Administered 2017-05-05 – 2017-05-07 (×3): 1 via ORAL
  Filled 2017-05-04 (×3): qty 1

## 2017-05-04 MED ORDER — GLIMEPIRIDE 2 MG PO TABS
2.0000 mg | ORAL_TABLET | Freq: Two times a day (BID) | ORAL | Status: DC
  Administered 2017-05-04 – 2017-05-08 (×8): 2 mg via ORAL
  Filled 2017-05-04 (×9): qty 1

## 2017-05-04 MED ORDER — SORBITOL 70 % SOLN: 30.0000 mL | Freq: Every day | Status: DC | PRN

## 2017-05-04 MED ORDER — CELECOXIB 200 MG PO CAPS
200.0000 mg | ORAL_CAPSULE | Freq: Two times a day (BID) | ORAL | Status: DC
  Administered 2017-05-04 – 2017-05-08 (×8): 200 mg via ORAL
  Filled 2017-05-04 (×8): qty 1

## 2017-05-04 MED ORDER — SODIUM CHLORIDE 0.9 % IV SOLN
INTRAVENOUS | Status: DC
  Administered 2017-05-04 – 2017-05-08 (×4): via INTRAVENOUS

## 2017-05-04 MED ORDER — HEPARIN SOD (PORK) LOCK FLUSH 100 UNIT/ML IV SOLN: 500.0000 [IU] | Freq: Once | INTRAVENOUS | Status: DC | PRN

## 2017-05-04 MED ORDER — LIDOCAINE-PRILOCAINE 2.5-2.5 % EX CREA: TOPICAL_CREAM | Freq: Once | CUTANEOUS | Status: DC

## 2017-05-04 MED ORDER — LEVALBUTEROL TARTRATE 45 MCG/ACT IN AERO: 2.0000 | INHALATION_SPRAY | Freq: Every day | RESPIRATORY_TRACT | Status: DC | PRN

## 2017-05-04 MED ORDER — COLD PACK MISC ONCOLOGY
1.0000 | Freq: Once | Status: DC | PRN
  Filled 2017-05-04: qty 1

## 2017-05-04 MED ORDER — VINCRISTINE SULFATE CHEMO INJECTION 1 MG/ML
Freq: Once | INTRAVENOUS | Status: AC
  Administered 2017-05-04: 18:00:00 via INTRAVENOUS
  Filled 2017-05-04: qty 12

## 2017-05-04 MED ORDER — CLONAZEPAM 0.5 MG PO TABS
0.5000 mg | ORAL_TABLET | Freq: Every day | ORAL | Status: DC
  Administered 2017-05-04 – 2017-05-07 (×4): 0.5 mg via ORAL
  Filled 2017-05-04 (×4): qty 1

## 2017-05-04 MED ORDER — SODIUM CHLORIDE 0.9 % IV SOLN: 375.0000 mg/m2 | Freq: Once | INTRAVENOUS | Status: DC

## 2017-05-04 MED ORDER — FAMOTIDINE IN NACL 20-0.9 MG/50ML-% IV SOLN: 20.0000 mg | Freq: Once | INTRAVENOUS | Status: DC | PRN

## 2017-05-04 MED ORDER — PROCHLORPERAZINE MALEATE 10 MG PO TABS: 10.0000 mg | ORAL_TABLET | Freq: Four times a day (QID) | ORAL | Status: DC | PRN

## 2017-05-04 NOTE — H&P (Addendum)
Admission History and Physical      Chief Complaint: Non-Hodgkin's lymphoma HPI: Mr. Goldwire is a 62 year old man diagnosed with high-grade B cell lymphoma following a mediastinoscopy procedure 02/05/2017.  The lymphoma has an 8:14 translocation.  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  He completed a second cycle of chemotherapy with rituximab/EPOCH beginning 03/02/2017.  He completed cycle 3 rituximab/EPOCH beginning 03/22/2017.  He completed cycle 1 intrathecal prophylaxis with methotrexate 03/27/2017.  Restaging PET scan 04/11/2017 showed a significant decrease in FDG uptake associated within the chest, abdomen and pelvis and axial and proximal appendicular skeleton.  No new foci of abnormal radiotracer uptake was identified.  He completed cycle 4 rituximab/EPOCH beginning 04/13/2017. He completed cycle 2 intrathecal methotrexate 04/16/2017.   He presents today for elective admission to proceed with cycle 5 rituximab/EPOCH.    Past Medical History:  Diagnosis Date  . Arthritis   . Asthma   . Basal cell carcinoma (BCC) of right temple region   . Cancer ([place])    basal cell of right temple; carcinoids  . Complication of anesthesia    hard to wake up after bronchoscopy  . COPD (chronic obstructive pulmonary disease) ([place])   . Diabetes ([place])   . Dyspnea   . GERD (gastroesophageal reflux disease)   . Gout   . Headache   . History of hiatal hernia 05/16/2006   small noted on EGD  . Hypertension   . Lung mass   . 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  . Sleep apnea    uses mouth piece    Past Surgical History:  Procedure Laterality Date  . BICEPS TENDON REPAIR Left   . CARPAL TUNNEL RELEASE    . COLONOSCOPY    . KNEE ARTHROSCOPY    . LEFT HEART CATHETERIZATION WITH CORONARY ANGIOGRAM N/A 12/07/2010   Procedure: LEFT HEART CATHETERIZATION WITH CORONARY ANGIOGRAM;  Surgeon: Leonie Man, MD;  Location: [facility] CATH LAB;  Service:  Cardiovascular;  Laterality: N/A;  . PILONIDAL CYST EXCISION    . PORTACATH PLACEMENT Left 02/27/2017   Procedure: INSERTION PORT-A-CATH;  Surgeon: [name], MD;  Location: [facility] ORS;  Service: General;  Laterality: Left;  . UPPER GASTROINTESTINAL ENDOSCOPY  05/16/2006  . VIDEO BRONCHOSCOPY WITH ENDOBRONCHIAL ULTRASOUND N/A 01/24/2017   Procedure: VIDEO BRONCHOSCOPY WITH ENDOBRONCHIAL ULTRASOUND with TRANSBRONCHIAL BIOPSY;  Surgeon: Grace Isaac, MD;  Location: [facility];  Service: Thoracic;  Laterality: N/A;  . VIDEO MEDIASTINOSCOPY N/A 02/05/2017   Procedure: VIDEO MEDIASTINOSCOPY;  Surgeon: Grace Isaac, MD;  Location: [facility];  Service: Thoracic;  Laterality: N/A;    Medications Prior to Admission  Medication Sig Dispense Refill  . acetaminophen (TYLENOL) 650 MG CR tablet Take 1,300 mg by mouth 2 (two) times daily as needed for pain.     Marland Kitchen albuterol (PROVENTIL) (2.5 MG/3ML) 0.083% nebulizer solution Take 2.5 mg by nebulization at bedtime as needed for wheezing or shortness of breath.    Marland Kitchen aspirin EC 81 MG tablet Take 81 mg by mouth daily.      . betamethasone dipropionate (DIPROLENE) 0.05 % cream Apply 1 application topically daily as needed (for psoriasis).     . celecoxib (CELEBREX) 200 MG capsule Take 200 mg by mouth 2 (two) times daily.    . clonazePAM (KLONOPIN) 0.5 MG tablet Take 0.5 mg by mouth at bedtime.      Marland Kitchen etodolac (LODINE) 400 MG tablet Take 400 mg by  mouth 3 (three) times daily as needed (gout pain).     . Fluticasone-Salmeterol (ADVAIR) 100-50 MCG/DOSE AEPB Inhale 1 puff into the lungs daily.      Marland Kitchen gabapentin (NEURONTIN) 300 MG capsule Take 1 capsule (300 mg total) by mouth at bedtime. 30 capsule 1  . glimepiride (AMARYL) 2 MG tablet Take 2 mg by mouth 2 (two) times daily before a meal.     . lidocaine-prilocaine (EMLA) cream Apply to port site one hour prior to use. Do not rub in. Cover with plastic. 30 g 1  . loratadine (CLARITIN) 10 MG tablet Take 10 mg by  mouth daily as needed for allergies.     Marland Kitchen losartan (COZAAR) 100 MG tablet Take 100 mg by mouth at bedtime.     . metFORMIN (GLUCOPHAGE) 1000 MG tablet Take 1,000 mg by mouth 2 (two) times daily with a meal.      . Multiple Vitamins-Minerals (MULTIVITAMINS THER. W/MINERALS) TABS Take 2 tablets by mouth daily.     Marland Kitchen omeprazole (PRILOSEC) 20 MG capsule Take 20 mg by mouth 2 (two) times daily.      . polyethylene glycol (MIRALAX) packet Take 17 g by mouth daily. (Patient taking differently: Take 17 g by mouth daily as needed for mild constipation or moderate constipation. ) 14 each 0  . Polyethylene Glycol 400 (BLINK TEARS OP) Apply 1 drop to eye daily as needed (allergies).    . prochlorperazine (COMPAZINE) 10 MG tablet Take 1 tablet (10 mg total) by mouth every 6 (six) hours as needed for nausea or vomiting. 30 tablet 0  . senna (SENOKOT) 8.6 MG TABS tablet Take 1 tablet (8.6 mg total) by mouth 2 (two) times daily. 120 each 0  . tiotropium (SPIRIVA) 18 MCG inhalation capsule Place 18 mcg into inhaler and inhale daily.    . traMADol (ULTRAM) 50 MG tablet Take 50 mg by mouth at bedtime as needed for moderate pain.     Marland Kitchen trolamine salicylate (ASPERCREME) 10 % cream Apply 1 application topically 2 (two) times daily as needed for muscle pain.     Marland Kitchen HYDROcodone-acetaminophen (NORCO/VICODIN) 5-325 MG tablet Take 1 tablet by mouth every 6 (six) hours as needed for moderate pain or severe pain. (Patient not taking: Reported on 04/13/2017) 20 tablet 0  . levalbuterol (XOPENEX HFA) 45 MCG/ACT inhaler Inhale 2 puffs into the lungs daily as needed for wheezing or shortness of breath.     . oxyCODONE (OXY IR/ROXICODONE) 5 MG immediate release tablet Take 1 tablet (5 mg total) by mouth every 4 (four) hours as needed for severe pain. (Patient not taking: Reported on 03/24/2017) 40 tablet 0   Scheduled Meds: . [START ON 05/05/2017] aspirin EC  81 mg Oral Daily  . celecoxib  200 mg Oral BID  . clonazePAM  0.5 mg Oral  QHS  . DOXOrubicin/vinCRIStine/etoposide CHEMO IV infusion for Inpatient CI   Intravenous Once  . gabapentin  300 mg Oral QHS  . glimepiride  2 mg Oral BID AC  . lidocaine-prilocaine   Topical Once  . losartan  100 mg Oral QHS  . metFORMIN  1,000 mg Oral BID WC  . mometasone-formoterol  2 puff Inhalation BID  . pantoprazole  40 mg Oral Daily  . predniSONE  75 mg Oral BID WC  . riTUXimab (RITUXAN) IV infusion  375 mg/m2 (Treatment Plan Recorded) Intravenous Once  . senna  1 tablet Oral BID  . [START ON 05/05/2017] tiotropium  18 mcg Inhalation Daily  Continuous Infusions: . sodium chloride    . sodium chloride 50 mL/hr at 05/04/17 0900  . sodium chloride    . famotidine    . ondansetron (ZOFRAN) with dexamethasone (DECADRON) IV     PRN Meds:.sodium chloride, albuterol, albuterol, alteplase, alteplase, Cold Pack, diphenhydrAMINE, diphenhydrAMINE, EPINEPHrine, EPINEPHrine, EPINEPHrine, EPINEPHrine, famotidine, heparin lock flush, heparin lock flush, heparin lock flush, heparin lock flush, Hot Pack, HYDROcodone-acetaminophen, loratadine, methylPREDNISolone sodium succinate, oxyCODONE, polyethylene glycol, prochlorperazine, sodium chloride flush, sodium chloride flush, sodium chloride flush, sodium chloride flush, sorbitol  No Known Allergies  Family History  Problem Relation Age of Onset  . Emphysema Mother   . Asthma Mother   . Heart disease Father      reports that he quit smoking about 29 years ago. His smoking use included cigarettes. He has a 60.00 pack-year smoking history. He has never used smokeless tobacco. He reports that he does not drink alcohol or use drugs.  ROS: He noted more fatigue following the most recent cycle of chemotherapy.  He also noted an increase in exertional dyspnea which lasted until just a few days ago.  He had some chest "discomfort" which he does not characterize as pain.  He has had no significant cough.  No fever.  He had 3-4 headaches which resolved  with standing.  No diplopia.  He continues to note blurry vision.  No mouth sores.  He had mild nausea.  He reports loose stools since hospital discharge.  He discontinued MiraLAX and Senokot.  He tends to develop constipation with the chemotherapy.  He has stable numbness in the hands and feet.  He denies any bleeding. He continues to note frequent urination during both the day and night.  Physical:  Blood pressure 119/64, pulse 85, temperature 98.5 F (36.9 C), temperature source Oral, resp. rate 16, height 6' 3"  (1.905 m), weight 246 lb 0.5 oz (111.6 kg), SpO2 97 %.  HEENT: No thrush or ulcers.  No facial or neck edema. Chest: Lungs with coarse breath sounds at the bases.  No respiratory distress.. Cardiovascular: Regular rate and rhythm. Abdomen: Abdomen soft and nontender.  No hepatosplenomegaly.  No mass. Extremities: No leg edema.  No arm edema. Neuro: Alert and oriented.  Motor exam grossly intact. Skin: No rash. Port-A-Cath without erythema.  Labs:  Results for orders placed or performed during the hospital encounter of 05/04/17 (from the past 48 hour(s))  Comprehensive metabolic panel     Status: Abnormal   Collection Time: 05/04/17 10:14 AM  Result Value Ref Range   Sodium 140 135 - 145 mmol/L   Potassium 3.9 3.5 - 5.1 mmol/L   Chloride 105 101 - 111 mmol/L   CO2 25 22 - 32 mmol/L   Glucose, Bld 154 (H) 65 - 99 mg/dL   BUN 15 6 - 20 mg/dL   Creatinine, Ser 0.60 (L) 0.61 - 1.24 mg/dL   Calcium 9.2 8.9 - 10.3 mg/dL   Total Protein 6.3 (L) 6.5 - 8.1 g/dL   Albumin 3.5 3.5 - 5.0 g/dL   AST 23 15 - 41 U/L   ALT 20 17 - 63 U/L   Alkaline Phosphatase 81 38 - 126 U/L   Total Bilirubin 0.5 0.3 - 1.2 mg/dL   GFR calc non Af Amer >60 >60 mL/min   GFR calc Af Amer >60 >60 mL/min    Comment: (NOTE) The eGFR has been calculated using the CKD EPI equation. This calculation has not been validated in all clinical situations. eGFR's persistently <  60 mL/min signify possible Chronic  Kidney Disease.    Anion gap 10 5 - 15    Comment: Performed at [facility], [place] [address] [place], [place] [address]  CBC WITH DIFFERENTIAL     Status: Abnormal   Collection Time: 05/04/17 10:14 AM  Result Value Ref Range   WBC 9.7 4.0 - 10.5 K/uL   RBC 4.29 4.22 - 5.81 MIL/uL   Hemoglobin 12.8 (L) 13.0 - 17.0 g/dL   HCT 39.2 39.0 - 52.0 %   MCV 91.4 78.0 - 100.0 fL   MCH 29.8 26.0 - 34.0 pg   MCHC 32.7 30.0 - 36.0 g/dL   RDW 16.8 (H) 11.5 - 15.5 %   Platelets 188 150 - 400 K/uL   Neutrophils Relative % 79 %   Neutro Abs 7.7 1.7 - 7.7 K/uL   Lymphocytes Relative 9 %   Lymphs Abs 0.8 0.7 - 4.0 K/uL   Monocytes Relative 10 %   Monocytes Absolute 1.0 0.1 - 1.0 K/uL   Eosinophils Relative 1 %   Eosinophils Absolute 0.1 0.0 - 0.7 K/uL   Basophils Relative 1 %   Basophils Absolute 0.1 0.0 - 0.1 K/uL    Comment: Performed at [facility], [place] [address], [place], [place] [address]  Lactate dehydrogenase     Status: None   Collection Time: 05/04/17 10:14 AM  Result Value Ref Range   LDH 116 98 - 192 U/L    Comment: Performed at [facility], [place] [address] [place], [place] [address]  Uric acid     Status: None   Collection Time: 05/04/17 10:14 AM  Result Value Ref Range   Uric Acid, Serum 5.2 4.4 - 7.6 mg/dL    Comment: Performed at [facility], [place] [address] [place], [place] [address]   No results found.  Assessment/Plan  1. High-grade B-cell non-Hodgkin's lymphoma, molecular studies pending, clinical stage IV, IPI-low intermediate risk, FISH panel revealed an 8q24/14q29fsion (MYC/IgH), negative for BCL 6 and BCL-2  Chest x-ray 01/15/2017-medial right upper lobe/perihilar mass.   Chest CT 01/19/2017-mass constricting the superior vena cava, subcarinal adenopathy, right suprahilar/mediastinal mass.   12/26/2018status postbronchoscopy with transbronchial biopsy of level 7node and  biopsy of aright upper lobe lung mass by Dr. GServando Snare There was compression of the right mainstem bronchus. A mass was noted in the right upper lobe. Mediastinal lymph nodes were seen on EBUS. Multiple biopsies of a level 7 node were obtained. Brushings and a biopsy of the right upper lobe mass were obtained. 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 Final pathology was nondiagnostic.   PET scan on 02/02/2017 showed hypermetabolism corresponding to the right sided mediastinal mass,progressive since the CT 01/19/2017. Metastatic disease/lymphoma within the bones, pericardium, stomach, bowel and abdominopelvic nodal stations.   Brain MRI 02/03/2017 showed no evidence of intracranial metastases or acute abnormality.   1/7/2019status postmediastinoscopy.  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 Molecular studies are pending.  Rituximab 02/08/2017  Cycle 1 CHOP 02/09/2017  Cycle 1 R-EPOCH 03/02/2017  Cycle2 R-EPOCH2/22/2019  Prophylactic intrathecal methotrexate 03/27/2017  Restaging PET scan 04/11/2017-significant decrease in FDG uptake associated with the chest, abdomen and pelvis and axial and proximal appendicular skeleton.  No new foci of abnormal radiotracer uptake identified.  Cycle 3 R-EPOCH 04/13/2017  Cycle 4 R-EPOCH 05/04/2017  2. Chest/upper backand right armpain secondary to #1- resolved. 3. Exertional dyspnea secondary to #1 and COPD.  Improved.  4. Asthma 5. Diabetes mellitus 6. Gout 7. Remote history of "carcinoid syndrome" 8. Remote history of tobacco use 9. Basal cell carcinoma removed from the right face 10. Chronic low back pain status post epidural steroid injections-followed at a pain management clinic 11. 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 Constipation secondary to narcotic analgesics and potentially lymphoma involving the GI tract-improved. 13. Erythema/induration at the mediastinoscopy incision  site- resolved.  Mr. Cummiskey appears stable.  He has completed 4 cycles of systemic therapy for treatment of high-grade B-cell lymphoma.  Recent restaging PET scan showed significant improvement.  He is being admitted electively to proceed with the fifth cycle of chemotherapy with rituximab/EPOCH.  He will receive intrathecal methotrexate with this cycle as well.  He reports developing significant exertional dyspnea following the last chemotherapy, lasting until a few days ago.  His respiratory status appears stable.  We will obtain a chest x-ray.    Ned Card ANP/GNP-BC 05/04/2017, 1:18 PM  Mr. Cecena was interviewed and examined.  He had multiple complaints following the most recent cycle of chemotherapy.  He appears well today.  His performance status remains significantly improved compared to pre-chemotherapy.  The plan is to proceed with cycle 5 systemic therapy today (cycle 4 R-EPOCH).  He will receive intrathecal methotrexate with this cycle.  The etiology of the dyspnea he experienced following the last cycle of chemotherapy is unclear.  We have a low clinical suspicion for CHF or pneumonia.  We will check a chest x-ray today.

## 2017-05-04 NOTE — Progress Notes (Signed)
Doses and dilutions for Rituxan, Doxorubicin, Etoposide and Oncovin verified with Reyne Dumas, RN.

## 2017-05-05 ENCOUNTER — Encounter (HOSPITAL_COMMUNITY): Payer: Self-pay | Admitting: *Deleted

## 2017-05-05 DIAGNOSIS — K5900 Constipation, unspecified: Secondary | ICD-10-CM

## 2017-05-05 LAB — GLUCOSE, CAPILLARY: Glucose-Capillary: 145 mg/dL — ABNORMAL HIGH (ref 65–99)

## 2017-05-05 MED ORDER — ONDANSETRON HCL 40 MG/20ML IJ SOLN
Freq: Once | INTRAMUSCULAR | Status: AC
  Administered 2017-05-05: 8 mg via INTRAVENOUS
  Filled 2017-05-05: qty 4

## 2017-05-05 MED ORDER — VINCRISTINE SULFATE CHEMO INJECTION 1 MG/ML
Freq: Once | INTRAVENOUS | Status: AC
  Administered 2017-05-05: 17:00:00 via INTRAVENOUS
  Filled 2017-05-05: qty 12

## 2017-05-05 MED ORDER — SORBITOL 70 % SOLN
30.0000 mL | Freq: Every day | Status: DC | PRN
  Administered 2017-05-06 – 2017-05-07 (×2): 30 mL via ORAL
  Filled 2017-05-05 (×2): qty 30

## 2017-05-05 NOTE — Progress Notes (Signed)
Jeffery Wood   DOB:11-08-1955   TD#:[id number]    Assessment & Plan:   High-grade, non-Hodgkin, B-cell lymphoma The patient tolerated chemotherapy well except for expected side effects such as mild anemia, deconditioning, and constipation He denies side effects of treatment so far We will continue treatment as scheduled  History of constipation Laxatives as needed  Generalized deconditioning He has shortness of breath on minimal exertion and weakness He has signs of steroid myopathy I recommend PT consult and he agreed to proceed  Shortness of breath Chest x-ray is clear except for mild atelectasis Recommend conservative management  Discharge planning Next week at the completion of therapy.   Heath Lark, MD 05/05/2017  1:39 PM   Subjective:  Patient is seen in his room.  He is receiving cycle 5, day 2 of treatment He tolerated treatment well without any nausea. He is vigilant keeping track on his bowel habits due to history of constipation with treatment He complained of shortness of breath on minimal exertion and also generalized muscle weakness since chemotherapy. He has poor sleep due to infusion treatment and frequent urination at night  Objective:  Vitals:   05/05/17 0546 05/05/17 1121  BP: 134/76   Pulse: (!) 105   Resp: 16   Temp: 97.9 F (36.6 C)   SpO2: 96% 92%     Intake/Output Summary (Last 24 hours) at 05/05/2017 1339 Last data filed at 05/05/2017 0400 Gross per 24 hour  Intake 1235.6 ml  Output -  Net 1235.6 ml    GENERAL:alert, no distress and comfortable SKIN: skin color, texture, turgor are normal, no rashes or significant lesions EYES: normal, Conjunctiva are pink and non-injected, sclera clear OROPHARYNX:no exudate, no erythema and lips, buccal mucosa, and tongue normal  NECK: supple, thyroid normal size, non-tender, without nodularity LYMPH:  no palpable lymphadenopathy in the cervical, axillary or inguinal LUNGS: clear to auscultation and  percussion with normal breathing effort HEART: regular rate & rhythm and no murmurs and no lower extremity edema ABDOMEN:abdomen soft, non-tender and normal bowel sounds Musculoskeletal:no cyanosis of digits and no clubbing.  Noted signs of muscle wasting NEURO: alert & oriented x 3 with fluent speech, no focal motor/sensory deficits   Labs:  Lab Results  Component Value Date   WBC 9.7 05/04/2017   HGB 12.8 (L) 05/04/2017   HCT 39.2 05/04/2017   MCV 91.4 05/04/2017   PLT 188 05/04/2017   NEUTROABS 7.7 05/04/2017    Lab Results  Component Value Date   NA 140 05/04/2017   K 3.9 05/04/2017   CL 105 05/04/2017   CO2 25 05/04/2017    Studies:  Dg Chest 2 View  Result Date: 05/04/2017 CLINICAL DATA:  Exertional dyspnea.  Non-Hodgkin's lymphoma. EXAM: CHEST - 2 VIEW COMPARISON:  03/24/2017 FINDINGS: Power port tip in the SVC above the right atrium, unchanged. Linear atelectasis in the right mid lung appears the same. Worsened areas of atelectasis in both lower lobes seen today in comparison to the previous study. No measurable effusion. No acute bone finding. IMPRESSION: Worsening of areas of linear atelectasis in both lower lungs. Electronically Signed   By: Nelson Chimes M.D.   On: 05/04/2017 16:55

## 2017-05-05 NOTE — Progress Notes (Signed)
Chemotherapy dosage and calculations checked and reviewed with Diana Torres RN. 

## 2017-05-06 ENCOUNTER — Encounter (HOSPITAL_COMMUNITY): Payer: Self-pay

## 2017-05-06 MED ORDER — SODIUM CHLORIDE 0.9 % IV SOLN
Freq: Once | INTRAVENOUS | Status: AC
  Administered 2017-05-06: 8 mg via INTRAVENOUS
  Filled 2017-05-06: qty 4

## 2017-05-06 MED ORDER — VINCRISTINE SULFATE CHEMO INJECTION 1 MG/ML
Freq: Once | INTRAVENOUS | Status: AC
  Administered 2017-05-06: 15:00:00 via INTRAVENOUS
  Filled 2017-05-06: qty 12

## 2017-05-06 NOTE — Evaluation (Signed)
Physical Therapy Evaluation Patient Details Name: JEREME LOREN MRN: [id number] DOB: 05/12/1955 Today's Date: 05/06/2017   History of Present Illness  62 yo male admitted with weakness. Hx of NHL-on chemo, DM, COPD, gout  Clinical Impression  On eval, pt was supervision level for mobility. He walked ~500 feet around unit while pushing IV pole. Initiated exercise program on today since pt's primary complaint is weakness. Discussed f/u PT options during session. He definitely wants help with regaining his strength. Since he is not really open to OP PT or HHPT at this time, will try to get him started on a HEP. Will follow and progress activity as able.     Follow Up Recommendations Pt could benefit greatly from OP PT however he does not feel like he can manage that right now    Equipment Recommendations  None recommended by PT    Recommendations for Other Services       Precautions / Restrictions Precautions Precautions: Fall Restrictions Weight Bearing Restrictions: No      Mobility  Bed Mobility               General bed mobility comments: oob in recliner  Transfers Overall transfer level: Needs assistance   Transfers: Sit to/from Stand Sit to Stand: Supervision         General transfer comment: safety.  Ambulation/Gait Ambulation/Gait assistance: Supervision Ambulation Distance (Feet): 500 Feet Assistive device: (IV pole) Gait Pattern/deviations: Step-through pattern     General Gait Details: mildly unsteady at times. Pt moves quickly.   Stairs            Wheelchair Mobility    Modified Rankin (Stroke Patients Only)       Balance                                             Pertinent Vitals/Pain Pain Assessment: No/denies pain    Home Living Family/patient expects to be discharged to:: Private residence Living Arrangements: Spouse/significant other Available Help at Discharge: Family Type of Home: House Home  Access: Stairs to enter Entrance Stairs-Rails: (posts on both sides) Technical brewer of Steps: 2   Home Equipment: None      Prior Function Level of Independence: Independent               Hand Dominance        Extremity/Trunk Assessment   Upper Extremity Assessment Upper Extremity Assessment: Generalized weakness    Lower Extremity Assessment Lower Extremity Assessment: Generalized weakness    Cervical / Trunk Assessment Cervical / Trunk Assessment: Normal  Communication   Communication: No difficulties  Cognition Arousal/Alertness: Awake/alert Behavior During Therapy: WFL for tasks assessed/performed Overall Cognitive Status: Within Functional Limits for tasks assessed                                        General Comments      Exercises General Exercises - Lower Extremity Hip ABduction/ADduction: AROM;15 reps;Standing;Both Heel Raises: AROM;Both;15 reps;Standing Mini-Sqauts: AROM;15 reps;Standing Other Exercises Other Exercises: hip extension, standing, 15 reps, both   Assessment/Plan    PT Assessment Patient needs continued PT services  PT Problem List Decreased strength;Decreased activity tolerance       PT Treatment Interventions DME instruction;Gait training;Functional mobility training;Therapeutic activities;Balance training;Patient/family education;Therapeutic  exercise;Stair training    PT Goals (Current goals can be found in the Care Plan section)  Acute Rehab PT Goals Patient Stated Goal: to regain strength and improve activity tolerance PT Goal Formulation: With patient Time For Goal Achievement: 05/20/17 Potential to Achieve Goals: Good    Frequency Min 3X/week   Barriers to discharge        Co-evaluation               AM-PAC PT "6 Clicks" Daily Activity  Outcome Measure Difficulty turning over in bed (including adjusting bedclothes, sheets and blankets)?: None Difficulty moving from lying on back  to sitting on the side of the bed? : None Difficulty sitting down on and standing up from a chair with arms (e.g., wheelchair, bedside commode, etc,.)?: None Help needed moving to and from a bed to chair (including a wheelchair)?: A Little Help needed walking in hospital room?: A Little Help needed climbing 3-5 steps with a railing? : A Little 6 Click Score: 21    End of Session   Activity Tolerance: Patient tolerated treatment well Patient left: in chair;with call bell/phone within reach   PT Visit Diagnosis: Muscle weakness (generalized) (M62.81)    Time: 6568-1275 PT Time Calculation (min) (ACUTE ONLY): 14 min   Charges:   PT Evaluation $PT Eval Moderate Complexity: 1 Mod     PT G Codes:          Weston Anna, MPT Pager: [phone]

## 2017-05-06 NOTE — Progress Notes (Signed)
Jeffery Wood   DOB:Sep 29, 1955   KG#:[id number]    Assessment & Plan:   High-grade, non-Hodgkin, B-cell lymphoma The patient tolerated chemotherapy well except for expected side effects such as mild anemia, deconditioning, and constipation He denies side effects of treatment so far We will continue treatment as scheduled  History of constipation Laxatives as needed  Generalized deconditioning Appreciate PT consult  Shortness of breath Chest x-ray is clear except for mild atelectasis Recommend conservative management  Discharge planning Next week at the completion of therapy.   Heath Lark, MD 05/06/2017  2:00 PM   Subjective:  He feels better today.  Denies nausea.  He had mild constipation, resolved with laxative.  He was seen by physical therapy recently and have gotten some helpful information from them.  Objective:  Vitals:   05/06/17 0808 05/06/17 1321  BP:  (!) 147/86  Pulse:  85  Resp:  18  Temp:  97.8 F (36.6 C)  SpO2: 99% 98%     Intake/Output Summary (Last 24 hours) at 05/06/2017 1400 Last data filed at 05/06/2017 7628 Gross per 24 hour  Intake 2642.64 ml  Output 3 ml  Net 2639.64 ml    GENERAL:alert, no distress and comfortable SKIN: skin color, texture, turgor are normal, no rashes or significant lesions EYES: normal, Conjunctiva are pink and non-injected, sclera clear OROPHARYNX:no exudate, no erythema and lips, buccal mucosa, and tongue normal  NECK: supple, thyroid normal size, non-tender, without nodularity LYMPH:  no palpable lymphadenopathy in the cervical, axillary or inguinal LUNGS: clear to auscultation and percussion with normal breathing effort HEART: regular rate & rhythm and no murmurs and no lower extremity edema ABDOMEN:abdomen soft, non-tender and normal bowel sounds Musculoskeletal:no cyanosis of digits and no clubbing  NEURO: alert & oriented x 3 with fluent speech, no focal motor/sensory deficits   Labs:  Lab Results   Component Value Date   WBC 9.7 05/04/2017   HGB 12.8 (L) 05/04/2017   HCT 39.2 05/04/2017   MCV 91.4 05/04/2017   PLT 188 05/04/2017   NEUTROABS 7.7 05/04/2017    Lab Results  Component Value Date   NA 140 05/04/2017   K 3.9 05/04/2017   CL 105 05/04/2017   CO2 25 05/04/2017    Studies:  Dg Chest 2 View  Result Date: 05/04/2017 CLINICAL DATA:  Exertional dyspnea.  Non-Hodgkin's lymphoma. EXAM: CHEST - 2 VIEW COMPARISON:  03/24/2017 FINDINGS: Power port tip in the SVC above the right atrium, unchanged. Linear atelectasis in the right mid lung appears the same. Worsened areas of atelectasis in both lower lobes seen today in comparison to the previous study. No measurable effusion. No acute bone finding. IMPRESSION: Worsening of areas of linear atelectasis in both lower lungs. Electronically Signed   By: Nelson Chimes M.D.   On: 05/04/2017 16:55

## 2017-05-07 ENCOUNTER — Inpatient Hospital Stay (HOSPITAL_COMMUNITY): Payer: BC Managed Care – PPO

## 2017-05-07 MED ORDER — VINCRISTINE SULFATE CHEMO INJECTION 1 MG/ML
Freq: Once | INTRAVENOUS | Status: AC
  Administered 2017-05-07: 14:00:00 via INTRAVENOUS
  Filled 2017-05-07: qty 12

## 2017-05-07 MED ORDER — LORAZEPAM 1 MG PO TABS
1.0000 mg | ORAL_TABLET | Freq: Every day | ORAL | Status: DC | PRN
  Administered 2017-05-07: 1 mg via ORAL
  Filled 2017-05-07: qty 1

## 2017-05-07 MED ORDER — SODIUM CHLORIDE 0.9 % IV SOLN
Freq: Once | INTRAVENOUS | Status: AC
  Administered 2017-05-07: 8 mg via INTRAVENOUS
  Filled 2017-05-07: qty 4

## 2017-05-07 MED ORDER — LIDOCAINE HCL 1 % IJ SOLN
INTRAMUSCULAR | Status: AC
  Filled 2017-05-07: qty 20

## 2017-05-07 MED ORDER — SODIUM CHLORIDE 0.9 % IJ SOLN
Freq: Once | INTRAMUSCULAR | Status: AC
  Administered 2017-05-07: 13:00:00 via INTRATHECAL
  Filled 2017-05-07: qty 0.48

## 2017-05-07 NOTE — Progress Notes (Signed)
IP PROGRESS NOTE  Subjective:   Jeffery Wood is completing the third day of infusional chemotherapy.  He is tolerating the chemotherapy well.  No complaint today.  Objective: Vital signs in last 24 hours: Blood pressure (!) 152/87, pulse 79, temperature 97.8 F (36.6 C), temperature source Oral, resp. rate 18, height _0  (1.905 m), weight 246 lb 0.5 oz (111.6 kg), SpO2 97 %.  Intake/Output from previous day: 04/07 0701 - 04/08 0700 In: 1373.6 [I.V.:1184.2; IV Piggyback:189.5] Out: -   Physical Exam:  HEENT: No thrush or ulcers Lungs: Clear bilaterally Cardiac: Regular rate and rhythm Abdomen: No hepatomegaly, nontender Extremities: No leg edema   Portacath/PICC-without erythema  Lab Results: Recent Labs    05/04/17 1014  WBC 9.7  HGB 12.8*  HCT 39.2  PLT 188    BMET Recent Labs    05/04/17 1014  NA 140  K 3.9  CL 105  CO2 25  GLUCOSE 154*  BUN 15  CREATININE 0.60*  CALCIUM 9.2     Medications: I have reviewed the patient's current medications.  Assessment/Plan:  1. High-grade B-cell non-Hodgkin's lymphoma, molecular studies pending, clinical stage IV, IPI-low intermediate risk, FISH panel revealed an 8q24/14q52fsion (MYC/IgH), negative for BCL 6 and BCL-2  Chest x-ray 01/15/2017-medial right upper lobe/perihilar mass.   Chest CT 01/19/2017-mass constricting the superior vena cava, subcarinal adenopathy, right suprahilar/mediastinal mass.   12/26/2018status postbronchoscopy with transbronchial biopsy of level 7node and biopsy of aright upper lobe lung mass by Dr. GServando Snare There was compression of the right mainstem bronchus. A mass was noted in the right upper lobe. Mediastinal lymph nodes were seen on EBUS. Multiple biopsies of a level 7 node were obtained. Brushings and a biopsy of the right upper lobe mass were obtained. 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 Final pathology was nondiagnostic.   PET scan on  02/02/2017 showed hypermetabolism corresponding to the right sided mediastinal mass,progressive since the CT 01/19/2017. Metastatic disease/lymphoma within the bones, pericardium, stomach, bowel and abdominopelvic nodal stations.   Brain MRI 02/03/2017 showed no evidence of intracranial metastases or acute abnormality.   1/7/2019status postmediastinoscopy.  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 Molecular studies are pending.  Rituximab 02/08/2017  Cycle 1 CHOP 02/09/2017  Cycle 1 R-EPOCH 03/02/2017  Cycle2 R-EPOCH2/22/2019  Prophylactic intrathecal methotrexate 03/27/2017  Restaging PET scan 04/11/2017-significant decrease in FDG uptake associated with the chest, abdomen and pelvis and axial and proximal appendicular skeleton. No new foci of abnormal radiotracer uptake identified.  Cycle 3R-EPOCH3/15/2019  Cycle 4 R-EPOCH 05/04/2017  2. Chest/upper backand right armpain secondary to #1- resolved. 3. Exertional dyspnea secondary to #1 and COPD.Improved. 4. Asthma 5. Diabetes mellitus 6. Gout 7. Remote history of "carcinoid syndrome" 8. Remote history of tobacco use 9. Basal cell carcinoma removed from the right face 10. Chronic low back pain status post epidural steroid injections-followed at a pain management clinic 11. 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 Constipation secondary to narcotic analgesics and potentially lymphoma involving the GI tract-improved. 13.Erythema/induration at the mediastinoscopy incision site-resolved.  [name] completing day 3 of infusional chemotherapy.  He will begin day 4 later this morning.  He is scheduled to receive intrathecal methotrexate today.  Orders were placed for the intrathecal methotrexate.  He will complete this cycle of chemotherapy tomorrow.    LOS: 3 days   GBetsy Coder MD   05/07/2017, 8:16 AM

## 2017-05-07 NOTE — Progress Notes (Signed)
PT Cancellation Note  Patient Details Name: Jeffery Wood MRN: [id number] DOB: April 23, 1955   Cancelled Treatment:    Reason Eval/Treat Not Completed: Medical issues which prohibited therapy; patient on bedrest after intrathecal infusion until 5 pm.  Will see prior to d/c for instruction in HEP.   Reginia Naas 05/07/2017, 3:28 PM Magda Kiel, [place] 05/07/2017

## 2017-05-07 NOTE — Procedures (Signed)
Informed consent was obtained from the patient prior to the procedure, including potential complications of headache, allergy and pain. A "time out" was performed. With the patient prone, the lower back was prepped with Betadine. 1% Lidocaine was used for local anesthesia. Lumbar puncture was performed at the L3-4 level using a 20 gauge needle with return of clear CSF. 12 mg of methotrexate was injected into the subarachnoid space. The patient tolerated the procedure well without apparent complications.

## 2017-05-08 DIAGNOSIS — Z7984 Long term (current) use of oral hypoglycemic drugs: Secondary | ICD-10-CM

## 2017-05-08 DIAGNOSIS — Z7982 Long term (current) use of aspirin: Secondary | ICD-10-CM

## 2017-05-08 DIAGNOSIS — Z79899 Other long term (current) drug therapy: Secondary | ICD-10-CM

## 2017-05-08 DIAGNOSIS — Z7951 Long term (current) use of inhaled steroids: Secondary | ICD-10-CM

## 2017-05-08 LAB — GLUCOSE, CAPILLARY: GLUCOSE-CAPILLARY: 170 mg/dL — AB (ref 65–99)

## 2017-05-08 MED ORDER — SODIUM CHLORIDE 0.9 % IV SOLN
Freq: Once | INTRAVENOUS | Status: AC
  Administered 2017-05-08: 16 mg via INTRAVENOUS
  Filled 2017-05-08: qty 8

## 2017-05-08 MED ORDER — SODIUM CHLORIDE 0.9 % IV SOLN
750.0000 mg/m2 | Freq: Once | INTRAVENOUS | Status: AC
  Administered 2017-05-08: 1840 mg via INTRAVENOUS
  Filled 2017-05-08: qty 92

## 2017-05-08 NOTE — Progress Notes (Signed)
Pt discharged home with spouse in stable condition. Discharge instructions given. Pt verbalized understanding. No immediate questions or concerns at this time. Pt opted to walk off unit

## 2017-05-08 NOTE — Discharge Instructions (Signed)
Call for fever, chills, other signs of infection, bleeding; Neulasta injection on 4/10

## 2017-05-08 NOTE — Progress Notes (Signed)
Per PT eval pt prefers HEP for home at discharge instead of HHPT/OPPT. HEP provided by inpatient PT. Marney Doctor RN,BSN,NCM [phone]

## 2017-05-08 NOTE — Progress Notes (Addendum)
Physical Therapy Treatment Patient Details Name: Jeffery Wood MRN: [id number] DOB: October 03, 1955 Today's Date: 05/08/2017    History of Present Illness 62 yo male admitted with weakness. Hx of NHL-on chemo, DM, COPD, gout    PT Comments    Verbally reviewed HEP for pt to perform at home as tolerated. Issued handout. Pt stated he felt comfortable starting with issued exercises. Instructed pt to follow up with oncologist before beginning training at gym. Pt stated he is planning to d/c home later today. Will continue to follow if pt remains in hospital.     Follow Up Recommendations  No PT follow up (issued HEP --pt preferred HEP instead of HH or OPPT)     Equipment Recommendations       Recommendations for Other Services       Precautions / Restrictions      Mobility  Bed Mobility                  Transfers                    Ambulation/Gait                 Stairs            Wheelchair Mobility    Modified Rankin (Stroke Patients Only)       Balance                                            Cognition                                              Exercises      General Comments        Pertinent Vitals/Pain      Home Living                      Prior Function            PT Goals (current goals can now be found in the care plan section)      Frequency           PT Plan Current plan remains appropriate    Co-evaluation              AM-PAC PT "6 Clicks" Daily Activity  Outcome Measure  Difficulty turning over in bed (including adjusting bedclothes, sheets and blankets)?: None Difficulty moving from lying on back to sitting on the side of the bed? : None Difficulty sitting down on and standing up from a chair with arms (e.g., wheelchair, bedside commode, etc,.)?: None Help needed moving to and from a bed to chair (including a wheelchair)?: A Little Help  needed walking in hospital room?: A Little Help needed climbing 3-5 steps with a railing? : A Little 6 Click Score: 21    End of Session         PT Visit Diagnosis: Muscle weakness (generalized) (M62.81)     Time:  1443- 0945    Charges:   $Therapeutic Exercise: 8-22 mins                    G Codes:          Ozella Almond  Starleen Blue, MPT Pager: [phone]

## 2017-05-08 NOTE — Discharge Summary (Signed)
Physician Discharge Summary  Patient ID: Jeffery Wood @ATTENDINGNPI @ MRN: [id number] DOB/AGE: July 23, 1955 62 y.o.  Admit date: 05/04/2017 Discharge date: 05/08/2017    Discharge Diagnoses:  Active Problems:   NHL (non-Hodgkin's lymphoma) (HCC)   Discharge Condition: Stable  Discharge Labs: None  Significant Diagnostic Studies: None  Consults: None  Procedures:  1. Infusional chemotherapy 2. Intrathecal chemotherapy  Disposition: Discharge disposition: 01-Home or Self Care      Allergies as of 05/08/2017   No Known Allergies     Medication List    STOP taking these medications   oxyCODONE 5 MG immediate release tablet Commonly known as:  Oxy IR/ROXICODONE     TAKE these medications   acetaminophen 650 MG CR tablet Commonly known as:  TYLENOL Take 1,300 mg by mouth 2 (two) times daily as needed for pain.   albuterol (2.5 MG/3ML) 0.083% nebulizer solution Commonly known as:  PROVENTIL Take 2.5 mg by nebulization at bedtime as needed for wheezing or shortness of breath.   aspirin EC 81 MG tablet Take 81 mg by mouth daily.   betamethasone dipropionate 0.05 % cream Commonly known as:  DIPROLENE Apply 1 application topically daily as needed (for psoriasis).   BLINK TEARS OP Apply 1 drop to eye daily as needed (allergies).   celecoxib 200 MG capsule Commonly known as:  CELEBREX Take 200 mg by mouth 2 (two) times daily.   clonazePAM 0.5 MG tablet Commonly known as:  KLONOPIN Take 0.5 mg by mouth at bedtime.   etodolac 400 MG tablet Commonly known as:  LODINE Take 400 mg by mouth 3 (three) times daily as needed (gout pain).   Fluticasone-Salmeterol 100-50 MCG/DOSE Aepb Commonly known as:  ADVAIR Inhale 1 puff into the lungs daily.   gabapentin 300 MG capsule Commonly known as:  NEURONTIN Take 1 capsule (300 mg total) by mouth at bedtime.   glimepiride 2 MG tablet Commonly known as:  AMARYL Take 2 mg by mouth 2 (two) times daily before a meal.    HYDROcodone-acetaminophen 5-325 MG tablet Commonly known as:  NORCO/VICODIN Take 1 tablet by mouth every 6 (six) hours as needed for moderate pain or severe pain.   levalbuterol 45 MCG/ACT inhaler Commonly known as:  XOPENEX HFA Inhale 2 puffs into the lungs daily as needed for wheezing or shortness of breath.   lidocaine-prilocaine cream Commonly known as:  EMLA Apply to port site one hour prior to use. Do not rub in. Cover with plastic.   loratadine 10 MG tablet Commonly known as:  CLARITIN Take 10 mg by mouth daily as needed for allergies.   losartan 100 MG tablet Commonly known as:  COZAAR Take 100 mg by mouth at bedtime.   metFORMIN 1000 MG tablet Commonly known as:  GLUCOPHAGE Take 1,000 mg by mouth 2 (two) times daily with a meal.   multivitamins ther. w/minerals Tabs tablet Take 2 tablets by mouth daily.   omeprazole 20 MG capsule Commonly known as:  PRILOSEC Take 20 mg by mouth 2 (two) times daily.   polyethylene glycol packet Commonly known as:  MIRALAX Take 17 g by mouth daily. What changed:    when to take this  reasons to take this   prochlorperazine 10 MG tablet Commonly known as:  COMPAZINE Take 1 tablet (10 mg total) by mouth every 6 (six) hours as needed for nausea or vomiting.   senna 8.6 MG Tabs tablet Commonly known as:  SENOKOT Take 1 tablet (8.6 mg total) by mouth 2 (  two) times daily.   tiotropium 18 MCG inhalation capsule Commonly known as:  SPIRIVA Place 18 mcg into inhaler and inhale daily.   traMADol 50 MG tablet Commonly known as:  ULTRAM Take 50 mg by mouth at bedtime as needed for moderate pain.   trolamine salicylate 10 % cream Commonly known as:  ASPERCREME Apply 1 application topically 2 (two) times daily as needed for muscle pain.       Follow-up Information    Ladell Pier, MD Follow up.   Specialty:  Oncology Why:  return for neulasta on 05/09/17; admission on 05/25/17 for next cycle of chemotherapy Contact  information: [place] [address] [phone]           Hospital Course: [name] is a 62 year old man with non-Hodgkin's lymphoma.  He was admitted electively 05/04/2017 to complete cycle 5 of systemic therapy with rituximab/EPOCH.  He completed infusional chemotherapy beginning 05/04/2017 and ending on 05/08/2017.  He received cyclophosphamide prior to discharge on 05/08/2017.  He completed cycle 3 prophylactic intrathecal methotrexate on 05/07/2017.  He tolerated the chemotherapy without significant acute toxicity.  [name] complained of dyspnea leading up to the current hospitalization.  He had no dyspnea while hospitalized.  [name] appears stable for discharge at the completion of chemotherapy 05/08/2017.  He will return to the [facility] for Neulasta on 05/09/2017.   Discharge Instructions    Discharge patient   Complete by:  As directed    Discharge disposition:  01-Home or Self Care   Discharge patient date:  05/08/2017   [facility] COMMUNICATION   Complete by:  As directed          Signed: Ned Card 05/08/2017, 1:42 PM

## 2017-05-09 ENCOUNTER — Inpatient Hospital Stay: Payer: BC Managed Care – PPO | Attending: Oncology

## 2017-05-09 VITALS — BP 127/84 | HR 94 | Temp 98.5°F | Resp 20

## 2017-05-09 DIAGNOSIS — Z5189 Encounter for other specified aftercare: Secondary | ICD-10-CM | POA: Diagnosis not present

## 2017-05-09 DIAGNOSIS — C8330 Diffuse large B-cell lymphoma, unspecified site: Secondary | ICD-10-CM

## 2017-05-09 DIAGNOSIS — C8590 Non-Hodgkin lymphoma, unspecified, unspecified site: Secondary | ICD-10-CM | POA: Diagnosis present

## 2017-05-09 MED ORDER — PEGFILGRASTIM INJECTION 6 MG/0.6ML ~~LOC~~
PREFILLED_SYRINGE | SUBCUTANEOUS | Status: AC
  Filled 2017-05-09: qty 0.6

## 2017-05-09 MED ORDER — PEGFILGRASTIM INJECTION 6 MG/0.6ML ~~LOC~~
6.0000 mg | PREFILLED_SYRINGE | Freq: Once | SUBCUTANEOUS | Status: AC
  Administered 2017-05-09: 6 mg via SUBCUTANEOUS

## 2017-05-09 NOTE — Patient Instructions (Signed)
Pegfilgrastim injection What is this medicine? PEGFILGRASTIM (PEG fil gra stim) is a long-acting granulocyte colony-stimulating factor that stimulates the growth of neutrophils, a type of [place] blood cell important in the body's fight against infection. It is used to reduce the incidence of fever and infection in patients with certain types of cancer who are receiving chemotherapy that affects the bone marrow, and to increase survival after being exposed to high doses of radiation. This medicine may be used for other purposes; ask your health care provider or pharmacist if you have questions. COMMON BRAND NAME(S): Neulasta What should I tell my health care provider before I take this medicine? They need to know if you have any of these conditions: -kidney disease -latex allergy -ongoing radiation therapy -sickle cell disease -skin reactions to acrylic adhesives (On-Body Injector only) -an unusual or allergic reaction to pegfilgrastim, filgrastim, other medicines, foods, dyes, or preservatives -pregnant or trying to get pregnant -breast-feeding How should I use this medicine? This medicine is for injection under the skin. If you get this medicine at home, you will be taught how to prepare and give the pre-filled syringe or how to use the On-body Injector. Refer to the patient Instructions for Use for detailed instructions. Use exactly as directed. Tell your healthcare provider immediately if you suspect that the On-body Injector may not have performed as intended or if you suspect the use of the On-body Injector resulted in a missed or partial dose. It is important that you put your used needles and syringes in a special sharps container. Do not put them in a trash can. If you do not have a sharps container, call your pharmacist or healthcare provider to get one. Talk to your pediatrician regarding the use of this medicine in children. While this drug may be prescribed for selected conditions,  precautions do apply. Overdosage: If you think you have taken too much of this medicine contact a poison control center or emergency room at once. NOTE: This medicine is only for you. Do not share this medicine with others. What if I miss a dose? It is important not to miss your dose. Call your doctor or health care professional if you miss your dose. If you miss a dose due to an On-body Injector failure or leakage, a new dose should be administered as soon as possible using a single prefilled syringe for manual use. What may interact with this medicine? Interactions have not been studied. Give your health care provider a list of all the medicines, herbs, non-prescription drugs, or dietary supplements you use. Also tell them if you smoke, drink alcohol, or use illegal drugs. Some items may interact with your medicine. This list may not describe all possible interactions. Give your health care provider a list of all the medicines, herbs, non-prescription drugs, or dietary supplements you use. Also tell them if you smoke, drink alcohol, or use illegal drugs. Some items may interact with your medicine. What should I watch for while using this medicine? You may need blood work done while you are taking this medicine. If you are going to need a MRI, CT scan, or other procedure, tell your doctor that you are using this medicine (On-Body Injector only). What side effects may I notice from receiving this medicine? Side effects that you should report to your doctor or health care professional as soon as possible: -allergic reactions like skin rash, itching or hives, swelling of the face, lips, or tongue -dizziness -fever -pain, redness, or irritation at site   where injected -pinpoint red spots on the skin -red or dark-brown urine -shortness of breath or breathing problems -stomach or side pain, or pain at the shoulder -swelling -tiredness -trouble passing urine or change in the amount of urine Side  effects that usually do not require medical attention (report to your doctor or health care professional if they continue or are bothersome): -bone pain -muscle pain This list may not describe all possible side effects. Call your doctor for medical advice about side effects. You may report side effects to FDA at 1-800-FDA-1088. Where should I keep my medicine? Keep out of the reach of children. Store pre-filled syringes in a refrigerator between 2 and 8 degrees C (36 and 46 degrees F). Do not freeze. Keep in carton to protect from light. Throw away this medicine if it is left out of the refrigerator for more than 48 hours. Throw away any unused medicine after the expiration date. NOTE: This sheet is a summary. It may not cover all possible information. If you have questions about this medicine, talk to your doctor, pharmacist, or health care provider.  2018 Elsevier/Gold Standard (2016-01-13 12:58:03)  

## 2017-05-14 ENCOUNTER — Telehealth: Payer: Self-pay | Admitting: *Deleted

## 2017-05-14 NOTE — Telephone Encounter (Signed)
"  Jeffery Wood calling for [employer].  Wondering if her received the same generic shot.  Having the shortness of breath again.  Call to let us know which shot.  [phone]."

## 2017-05-15 ENCOUNTER — Telehealth: Payer: Self-pay

## 2017-05-15 NOTE — Telephone Encounter (Signed)
Received triage message from pt reporting SOB and inquiring about injection received. Called pt and informed that he received neulasta injection on 4/10, pt voiced understanding. Pt reported "for the last 2 times after the injection ive gotten SOB to where I can't do anything". Pt reports the SOB "is getting better now". Pt denies any other symptoms. MD notified. Per MD, pt can be seen in clinic today for evaluation. Pt denies being seen and knows to call with any worsening symptoms.

## 2017-05-24 ENCOUNTER — Other Ambulatory Visit: Payer: Self-pay | Admitting: Oncology

## 2017-05-25 ENCOUNTER — Inpatient Hospital Stay (HOSPITAL_COMMUNITY): Payer: BC Managed Care – PPO

## 2017-05-25 ENCOUNTER — Inpatient Hospital Stay: Payer: BC Managed Care – PPO | Admitting: Oncology

## 2017-05-25 ENCOUNTER — Other Ambulatory Visit: Payer: Self-pay

## 2017-05-25 ENCOUNTER — Inpatient Hospital Stay: Payer: BC Managed Care – PPO

## 2017-05-25 ENCOUNTER — Inpatient Hospital Stay (HOSPITAL_COMMUNITY)
Admission: AD | Admit: 2017-05-25 | Discharge: 2017-05-29 | DRG: 847 | Disposition: A | Discharge status: Home / Self Care | Payer: BC Managed Care – PPO | Source: Ambulatory Visit | Attending: Oncology | Admitting: Oncology

## 2017-05-25 ENCOUNTER — Encounter (HOSPITAL_COMMUNITY): Payer: Self-pay

## 2017-05-25 DIAGNOSIS — Z85828 Personal history of other malignant neoplasm of skin: Secondary | ICD-10-CM

## 2017-05-25 DIAGNOSIS — Z7951 Long term (current) use of inhaled steroids: Secondary | ICD-10-CM | POA: Diagnosis not present

## 2017-05-25 DIAGNOSIS — C8512 Unspecified B-cell lymphoma, intrathoracic lymph nodes: Secondary | ICD-10-CM | POA: Diagnosis present

## 2017-05-25 DIAGNOSIS — J449 Chronic obstructive pulmonary disease, unspecified: Secondary | ICD-10-CM

## 2017-05-25 DIAGNOSIS — M109 Gout, unspecified: Secondary | ICD-10-CM | POA: Diagnosis present

## 2017-05-25 DIAGNOSIS — I10 Essential (primary) hypertension: Secondary | ICD-10-CM | POA: Diagnosis present

## 2017-05-25 DIAGNOSIS — Z7984 Long term (current) use of oral hypoglycemic drugs: Secondary | ICD-10-CM

## 2017-05-25 DIAGNOSIS — G4730 Sleep apnea, unspecified: Secondary | ICD-10-CM | POA: Diagnosis present

## 2017-05-25 DIAGNOSIS — Z79899 Other long term (current) drug therapy: Secondary | ICD-10-CM

## 2017-05-25 DIAGNOSIS — Z87891 Personal history of nicotine dependence: Secondary | ICD-10-CM

## 2017-05-25 DIAGNOSIS — K5900 Constipation, unspecified: Secondary | ICD-10-CM | POA: Diagnosis present

## 2017-05-25 DIAGNOSIS — E1142 Type 2 diabetes mellitus with diabetic polyneuropathy: Secondary | ICD-10-CM | POA: Diagnosis present

## 2017-05-25 DIAGNOSIS — E119 Type 2 diabetes mellitus without complications: Secondary | ICD-10-CM | POA: Diagnosis not present

## 2017-05-25 DIAGNOSIS — C8330 Diffuse large B-cell lymphoma, unspecified site: Secondary | ICD-10-CM

## 2017-05-25 DIAGNOSIS — Z5111 Encounter for antineoplastic chemotherapy: Principal | ICD-10-CM

## 2017-05-25 DIAGNOSIS — Z7982 Long term (current) use of aspirin: Secondary | ICD-10-CM | POA: Diagnosis not present

## 2017-05-25 DIAGNOSIS — C8590 Non-Hodgkin lymphoma, unspecified, unspecified site: Secondary | ICD-10-CM | POA: Diagnosis present

## 2017-05-25 LAB — COMPREHENSIVE METABOLIC PANEL
ALK PHOS: 72 U/L (ref 38–126)
ALT: 23 U/L (ref 17–63)
ANION GAP: 9 (ref 5–15)
AST: 25 U/L (ref 15–41)
Albumin: 3.6 g/dL (ref 3.5–5.0)
BILIRUBIN TOTAL: 0.2 mg/dL — AB (ref 0.3–1.2)
BUN: 12 mg/dL (ref 6–20)
CALCIUM: 9.2 mg/dL (ref 8.9–10.3)
CO2: 26 mmol/L (ref 22–32)
Chloride: 104 mmol/L (ref 101–111)
Creatinine, Ser: 0.72 mg/dL (ref 0.61–1.24)
GFR calc Af Amer: >60 mL/min (ref >60)
Glucose, Bld: 101 mg/dL — ABNORMAL HIGH (ref 65–99)
POTASSIUM: 4.1 mmol/L (ref 3.5–5.1)
Sodium: 139 mmol/L (ref 135–145)
TOTAL PROTEIN: 6 g/dL — AB (ref 6.5–8.1)

## 2017-05-25 LAB — CBC WITH DIFFERENTIAL/PLATELET
Basophils Absolute: 0.1 10*3/uL (ref 0.0–0.1)
Basophils Relative: 2 %
EOS PCT: 2 %
Eosinophils Absolute: 0.2 10*3/uL (ref 0.0–0.7)
HCT: 37.7 % — ABNORMAL LOW (ref 39.0–52.0)
Hemoglobin: 12.3 g/dL — ABNORMAL LOW (ref 13.0–17.0)
LYMPHS PCT: 19 %
Lymphs Abs: 1.4 10*3/uL (ref 0.7–4.0)
MCH: 30.2 pg (ref 26.0–34.0)
MCHC: 32.6 g/dL (ref 30.0–36.0)
MCV: 92.6 fL (ref 78.0–100.0)
Monocytes Absolute: 0.5 10*3/uL (ref 0.1–1.0)
Monocytes Relative: 7 %
Neutro Abs: 5.2 10*3/uL (ref 1.7–7.7)
Neutrophils Relative %: 70 %
PLATELETS: 278 10*3/uL (ref 150–400)
RBC: 4.07 MIL/uL — AB (ref 4.22–5.81)
RDW: 16.8 % — AB (ref 11.5–15.5)
WBC: 7.4 10*3/uL (ref 4.0–10.5)

## 2017-05-25 LAB — LACTATE DEHYDROGENASE: LDH: 111 U/L (ref 98–192)

## 2017-05-25 MED ORDER — SODIUM CHLORIDE 0.9 % IV SOLN
INTRAVENOUS | Status: DC
  Administered 2017-05-25 – 2017-05-28 (×4): via INTRAVENOUS

## 2017-05-25 MED ORDER — GABAPENTIN 300 MG PO CAPS
300.0000 mg | ORAL_CAPSULE | Freq: Every day | ORAL | Status: DC
  Administered 2017-05-25 – 2017-05-28 (×4): 300 mg via ORAL
  Filled 2017-05-25 (×4): qty 1

## 2017-05-25 MED ORDER — SORBITOL 70 % SOLN: 30.0000 mL | Freq: Every day | Status: DC | PRN

## 2017-05-25 MED ORDER — HEPARIN SOD (PORK) LOCK FLUSH 100 UNIT/ML IV SOLN: 500.0000 [IU] | Freq: Once | INTRAVENOUS | Status: DC | PRN

## 2017-05-25 MED ORDER — SODIUM CHLORIDE 0.9 % IV SOLN
INTRAVENOUS | Status: DC
  Administered 2017-05-25: 11:00:00 via INTRAVENOUS

## 2017-05-25 MED ORDER — LEVALBUTEROL TARTRATE 45 MCG/ACT IN AERO: 2.0000 | INHALATION_SPRAY | Freq: Every day | RESPIRATORY_TRACT | Status: DC | PRN

## 2017-05-25 MED ORDER — ONDANSETRON HCL 4 MG PO TABS: 4.0000 mg | ORAL_TABLET | Freq: Four times a day (QID) | ORAL | Status: DC | PRN

## 2017-05-25 MED ORDER — MOMETASONE FURO-FORMOTEROL FUM 100-5 MCG/ACT IN AERO
2.0000 | INHALATION_SPRAY | Freq: Two times a day (BID) | RESPIRATORY_TRACT | Status: DC
  Administered 2017-05-25 – 2017-05-29 (×8): 2 via RESPIRATORY_TRACT
  Filled 2017-05-25: qty 8.8

## 2017-05-25 MED ORDER — ALBUTEROL SULFATE (2.5 MG/3ML) 0.083% IN NEBU: 2.5000 mg | INHALATION_SOLUTION | Freq: Every evening | RESPIRATORY_TRACT | Status: DC | PRN

## 2017-05-25 MED ORDER — LOSARTAN POTASSIUM 50 MG PO TABS
100.0000 mg | ORAL_TABLET | Freq: Every day | ORAL | Status: DC
  Administered 2017-05-25 – 2017-05-28 (×4): 100 mg via ORAL
  Filled 2017-05-25 (×4): qty 2

## 2017-05-25 MED ORDER — TRAMADOL HCL 50 MG PO TABS
50.0000 mg | ORAL_TABLET | Freq: Every evening | ORAL | Status: DC | PRN
  Administered 2017-05-25: 50 mg via ORAL
  Filled 2017-05-25: qty 1

## 2017-05-25 MED ORDER — SODIUM CHLORIDE 0.9% FLUSH: 10.0000 mL | INTRAVENOUS | Status: DC | PRN

## 2017-05-25 MED ORDER — CLONAZEPAM 0.5 MG PO TABS
0.5000 mg | ORAL_TABLET | Freq: Every day | ORAL | Status: DC
  Administered 2017-05-25 – 2017-05-28 (×4): 0.5 mg via ORAL
  Filled 2017-05-25 (×4): qty 1

## 2017-05-25 MED ORDER — GLIMEPIRIDE 2 MG PO TABS
2.0000 mg | ORAL_TABLET | Freq: Two times a day (BID) | ORAL | Status: DC
  Administered 2017-05-25 – 2017-05-29 (×8): 2 mg via ORAL
  Filled 2017-05-25 (×10): qty 1

## 2017-05-25 MED ORDER — PREDNISONE 50 MG PO TABS
70.0000 mg | ORAL_TABLET | Freq: Two times a day (BID) | ORAL | Status: DC
  Administered 2017-05-25 – 2017-05-29 (×8): 70 mg via ORAL
  Filled 2017-05-25 (×8): qty 1

## 2017-05-25 MED ORDER — POLYETHYLENE GLYCOL 3350 17 G PO PACK
17.0000 g | PACK | Freq: Every day | ORAL | Status: DC | PRN
  Administered 2017-05-26: 17 g via ORAL
  Filled 2017-05-25: qty 1

## 2017-05-25 MED ORDER — SODIUM CHLORIDE 0.9% FLUSH
3.0000 mL | INTRAVENOUS | Status: DC | PRN
Start: 2017-05-25 — End: 2017-05-29

## 2017-05-25 MED ORDER — ASPIRIN EC 81 MG PO TBEC
81.0000 mg | DELAYED_RELEASE_TABLET | Freq: Every day | ORAL | Status: DC
  Administered 2017-05-25 – 2017-05-29 (×4): 81 mg via ORAL
  Filled 2017-05-25 (×4): qty 1

## 2017-05-25 MED ORDER — SODIUM CHLORIDE 0.9 % IV SOLN
375.0000 mg/m2 | Freq: Once | INTRAVENOUS | Status: AC
  Administered 2017-05-25: 900 mg via INTRAVENOUS
  Filled 2017-05-25: qty 50

## 2017-05-25 MED ORDER — PREDNISONE 50 MG PO TABS
60.0000 mg/m2/d | ORAL_TABLET | Freq: Two times a day (BID) | ORAL | Status: DC
  Administered 2017-05-25: 73.25 mg via ORAL
  Filled 2017-05-25 (×2): qty 0.5

## 2017-05-25 MED ORDER — HOT PACK MISC ONCOLOGY
1.0000 | Freq: Once | Status: DC | PRN
  Filled 2017-05-25: qty 1

## 2017-05-25 MED ORDER — DIPHENHYDRAMINE HCL 50 MG PO CAPS
50.0000 mg | ORAL_CAPSULE | Freq: Once | ORAL | Status: AC
  Administered 2017-05-25: 50 mg via ORAL
  Filled 2017-05-25: qty 1

## 2017-05-25 MED ORDER — LEVALBUTEROL HCL 0.63 MG/3ML IN NEBU: 0.6300 mg | INHALATION_SOLUTION | Freq: Every day | RESPIRATORY_TRACT | Status: DC | PRN

## 2017-05-25 MED ORDER — SODIUM CHLORIDE 0.9 % IV SOLN
Freq: Once | INTRAVENOUS | Status: AC
  Administered 2017-05-25: 8 mg via INTRAVENOUS
  Filled 2017-05-25: qty 4

## 2017-05-25 MED ORDER — ONDANSETRON HCL 4 MG/2ML IJ SOLN: 4.0000 mg | Freq: Four times a day (QID) | INTRAMUSCULAR | Status: DC | PRN

## 2017-05-25 MED ORDER — METFORMIN HCL 500 MG PO TABS
1000.0000 mg | ORAL_TABLET | Freq: Two times a day (BID) | ORAL | Status: DC
  Administered 2017-05-25 – 2017-05-29 (×8): 1000 mg via ORAL
  Filled 2017-05-25 (×8): qty 2

## 2017-05-25 MED ORDER — ALTEPLASE 2 MG IJ SOLR
2.0000 mg | Freq: Once | INTRAMUSCULAR | Status: DC | PRN
  Filled 2017-05-25: qty 2

## 2017-05-25 MED ORDER — COLD PACK MISC ONCOLOGY
1.0000 | Freq: Once | Status: DC | PRN
  Filled 2017-05-25: qty 1

## 2017-05-25 MED ORDER — HEPARIN SOD (PORK) LOCK FLUSH 100 UNIT/ML IV SOLN: 250.0000 [IU] | Freq: Once | INTRAVENOUS | Status: DC | PRN

## 2017-05-25 MED ORDER — SENNA 8.6 MG PO TABS
1.0000 | ORAL_TABLET | Freq: Two times a day (BID) | ORAL | Status: DC
  Administered 2017-05-25 – 2017-05-29 (×9): 8.6 mg via ORAL
  Filled 2017-05-25 (×9): qty 1

## 2017-05-25 MED ORDER — PROCHLORPERAZINE MALEATE 10 MG PO TABS: 10.0000 mg | ORAL_TABLET | Freq: Four times a day (QID) | ORAL | Status: DC | PRN

## 2017-05-25 MED ORDER — PANTOPRAZOLE SODIUM 40 MG PO TBEC
40.0000 mg | DELAYED_RELEASE_TABLET | Freq: Every day | ORAL | Status: DC
  Administered 2017-05-25 – 2017-05-29 (×5): 40 mg via ORAL
  Filled 2017-05-25 (×5): qty 1

## 2017-05-25 MED ORDER — VINCRISTINE SULFATE CHEMO INJECTION 1 MG/ML
Freq: Once | INTRAVENOUS | Status: AC
  Administered 2017-05-25: 15:00:00 via INTRAVENOUS
  Filled 2017-05-25: qty 12

## 2017-05-25 MED ORDER — ACETAMINOPHEN 325 MG PO TABS
650.0000 mg | ORAL_TABLET | Freq: Once | ORAL | Status: AC
Start: 2017-05-25 — End: 2017-05-25
  Administered 2017-05-25: 650 mg via ORAL
  Filled 2017-05-25: qty 2

## 2017-05-25 MED ORDER — TIOTROPIUM BROMIDE MONOHYDRATE 18 MCG IN CAPS
18.0000 ug | ORAL_CAPSULE | Freq: Every day | RESPIRATORY_TRACT | Status: DC
  Administered 2017-05-26 – 2017-05-29 (×4): 18 ug via RESPIRATORY_TRACT
  Filled 2017-05-25: qty 5

## 2017-05-25 NOTE — Progress Notes (Signed)
Rituxan dosage and dilution verified with Reyne Dumas, RN.

## 2017-05-25 NOTE — Progress Notes (Signed)
Chemo dosages and calculations for adriamycin, vepesid and oncovin done with Aldean Baker RN

## 2017-05-25 NOTE — H&P (Signed)
Patient History and Physical   Gifford G Hakes [id number] 02/01/1955 62 y.o. 05/25/2017    Patient Identification: 62-year-old with non-Hodgkin's lymphoma  HPI: Mr. Gieske was diagnosed with high-grade large B-cell lymphoma and a mediastinoscopy 02/05/2017.  The lymphoma has an 8: 14 translocation.  He completed cycle 1 of systemic therapy with CHOP/rituximab after he was admitted with SVC syndrome.  A restaging PET scan after 3 cycles of systemic therapy revealed a decrease in FDG uptake associated with areas of lymphoma and no new foci of increased activity.    Mr. Hobdy was discharged on 05/08/2017 after completing cycle 5 of systemic chemotherapy with R-EPOCH.  He received Neulasta at the cancer center on 05/09/2017.  Mr. Luckett had less dyspnea following this cycle of chemotherapy.  He reports improvement in dyspnea when he blew up balloons.  No fever.  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  Stable back pain. He has malaise.  No nausea or vomiting.  Constipation is relieved with MiraLAX.  Mr. Sandford is admitted for cycle 6 systemic chemotherapy with R-EPOCH  PMH:  Past Medical History:  Diagnosis Date  . Arthritis   . Asthma   . Basal cell carcinoma (BCC) of right temple region   . Cancer (HCC)    basal cell of right temple; carcinoids  . Complication of anesthesia    hard to wake up after bronchoscopy  . COPD (chronic obstructive pulmonary disease) (HCC)   . Diabetes (HCC)   . Dyspnea   . GERD (gastroesophageal reflux disease)   . Gout   . Headache   . History of hiatal hernia 05/16/2006   small noted on EGD  . Hypertension   . Lung mass   . 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  . Sleep apnea    uses mouth piece    Past Surgical History:  Procedure Laterality Date  . BICEPS TENDON REPAIR Left   . CARPAL TUNNEL RELEASE    . COLONOSCOPY    . KNEE ARTHROSCOPY    . LEFT HEART CATHETERIZATION WITH CORONARY ANGIOGRAM N/A 12/07/2010   Procedure: LEFT HEART  CATHETERIZATION WITH CORONARY ANGIOGRAM;  Surgeon: [name] [name], MD;  Location: [facility] CATH [facility];  Service: Cardiovascular;  Laterality: N/A;  . PILONIDAL CYST EXCISION    . PORTACATH PLACEMENT Left 02/27/2017   Procedure: INSERTION PORT-A-CATH;  Surgeon: Martin, Matthew, MD;  Location: [facility] ORS;  Service: General;  Laterality: Left;  . UPPER GASTROINTESTINAL ENDOSCOPY  05/16/2006  . VIDEO BRONCHOSCOPY WITH ENDOBRONCHIAL ULTRASOUND N/A 01/24/2017   Procedure: VIDEO BRONCHOSCOPY WITH ENDOBRONCHIAL ULTRASOUND with TRANSBRONCHIAL BIOPSY;  Surgeon: Gerhardt, Edward B, MD;  Location: [facility] OR;  Service: Thoracic;  Laterality: N/A;  . VIDEO MEDIASTINOSCOPY N/A 02/05/2017   Procedure: VIDEO MEDIASTINOSCOPY;  Surgeon: Gerhardt, Edward B, MD;  Location: [facility] OR;  Service: Thoracic;  Laterality: N/A;    Allergies:  No Known Allergies  Medications:  Medications Prior to Admission  Medication Sig Dispense Refill  . acetaminophen (TYLENOL) 650 MG CR tablet Take 1,300 mg by mouth 2 (two) times daily as needed for pain.     . albuterol (PROVENTIL) (2.5 MG/3ML) 0.083% nebulizer solution Take 2.5 mg by nebulization at bedtime as needed for wheezing or shortness of breath.    . aspirin EC 81 MG tablet Take 81 mg by mouth daily.      . betamethasone dipropionate (DIPROLENE) 0.05 % cream Apply 1 application topically daily as needed (for psoriasis).     . celecoxib (CELEBREX) 200   MG capsule Take 200 mg by mouth 2 (two) times daily.    . clonazePAM (KLONOPIN) 0.5 MG tablet Take 0.5 mg by mouth at bedtime.      . etodolac (LODINE) 400 MG tablet Take 400 mg by mouth 3 (three) times daily as needed (gout pain).     . Fluticasone-Salmeterol (ADVAIR) 100-50 MCG/DOSE AEPB Inhale 1 puff into the lungs daily.      . gabapentin (NEURONTIN) 300 MG capsule Take 1 capsule (300 mg total) by mouth at bedtime. (Patient taking differently: Take 300 mg by mouth 2 (two) times daily. ) 30 capsule 1  . glimepiride (AMARYL) 2 MG tablet Take  2 mg by mouth 2 (two) times daily before a meal.     . levalbuterol (XOPENEX HFA) 45 MCG/ACT inhaler Inhale 2 puffs into the lungs daily as needed for wheezing or shortness of breath.     . lidocaine-prilocaine (EMLA) cream Apply to port site one hour prior to use. Do not rub in. Cover with plastic. 30 g 1  . loratadine (CLARITIN) 10 MG tablet Take 10 mg by mouth daily as needed for allergies.     . losartan (COZAAR) 100 MG tablet Take 100 mg by mouth at bedtime.     . metFORMIN (GLUCOPHAGE) 1000 MG tablet Take 1,000 mg by mouth 2 (two) times daily with a meal.      . Multiple Vitamins-Minerals (MULTIVITAMINS THER. W/MINERALS) TABS Take 2 tablets by mouth daily.     . omeprazole (PRILOSEC) 20 MG capsule Take 20 mg by mouth 2 (two) times daily.      . polyethylene glycol (MIRALAX) packet Take 17 g by mouth daily. (Patient taking differently: Take 17 g by mouth daily as needed for mild constipation or moderate constipation. ) 14 each 0  . Polyethylene Glycol 400 (BLINK TEARS OP) Apply 1 drop to eye daily as needed (allergies).    . prochlorperazine (COMPAZINE) 10 MG tablet Take 1 tablet (10 mg total) by mouth every 6 (six) hours as needed for nausea or vomiting. 30 tablet 0  . senna (SENOKOT) 8.6 MG TABS tablet Take 1 tablet (8.6 mg total) by mouth 2 (two) times daily. (Patient taking differently: Take 1 tablet by mouth daily as needed for mild constipation. ) 120 each 0  . tiotropium (SPIRIVA) 18 MCG inhalation capsule Place 18 mcg into inhaler and inhale daily.    . traMADol (ULTRAM) 50 MG tablet Take 50 mg by mouth at bedtime as needed for moderate pain.     . trolamine salicylate (ASPERCREME) 10 % cream Apply 1 application topically 2 (two) times daily as needed for muscle pain.     . HYDROcodone-acetaminophen (NORCO/VICODIN) 5-325 MG tablet Take 1 tablet by mouth every 6 (six) hours as needed for moderate pain or severe pain. (Patient not taking: Reported on 04/13/2017) 20 tablet 0    Social  History:    reports that he quit smoking about 29 years ago. His smoking use included cigarettes. He has a 60.00 pack-year smoking history. He has never used smokeless tobacco. He reports that he does not drink alcohol or use drugs.  Family History:  Family History  Problem Relation Age of Onset  . Emphysema Mother   . Asthma Mother   . Heart disease Father     Review of Systems:  Positives include: Malaise, exertional dyspnea- improved following the last cycle of chemotherapy, chronic back pain, chronic numbness and burning in the feet, constipation, headaches in the morning-relieved spontaneously   when he sits up  A complete ROS was otherwise negative.   Physical Exam:  Blood pressure 126/70, pulse 77, temperature 98 F (36.7 C), temperature source Oral, resp. rate 14, height 6' 3" (1.905 m), weight 249 lb (112.9 kg), SpO2 99 %.  HEENT: No thrush or ulcers Lungs: Clear bilaterally, no respiratory distress Cardiac: Regular rate and rhythm, no gallop Abdomen: No hepatosplenomegaly, nontender  Vascular: No leg, facial, or arm edema Lymph nodes: No cervical or supraclavicular nodes Neurologic: Alert and oriented, follows commands, the motor exam appears grossly intact in the upper and lower extremities Skin: No rash   Lab Results:  Lab Results  Component Value Date   WBC 7.4 05/25/2017   HGB 12.3 (L) 05/25/2017   HCT 37.7 (L) 05/25/2017   MCV 92.6 05/25/2017   PLT 278 05/25/2017   NEUTROABS 5.2 05/25/2017   Potassium 4.1, creatinine 0.72, AST 25, ALT 23, alkaline phosphatase 72, bilirubin 0.2   Radiological Studies:  X-ray Chest Pa And Lateral  Result Date: 05/25/2017 CLINICAL DATA:  History of right lung mass and non-Hodgkin's lymphoma. No current symptoms. EXAM: CHEST - 2 VIEW COMPARISON:  05/04/2017 FINDINGS: Power port in place. There appears to be slightly more deformity of the catheter as it passes underneath the clavicle than was seen on the previous study.  Left lung remains clear. Right hilar prominence with right mid lung scarring or volume loss appears the same. No new abnormality. No significant bone finding. IMPRESSION: Stable appearance of right hilar prominence and right perihilar pulmonary scarring or atelectasis. Power port tubing appears somewhat more deformed as it passes adjacent to and underneath the clavicle. Electronically Signed   By: Nelson Chimes M.D.   On: 05/25/2017 10:31     Impression and Plan: 1. High-grade B-cell non-Hodgkin's lymphoma, molecular studies pending, clinical stage IV, IPI-low intermediate risk, FISH panel revealed an 8q24/14q4fsion (MYC/IgH), negative for BCL 6 and BCL-2  Chest x-ray 01/15/2017-medial right upper lobe/perihilar mass.   Chest CT 01/19/2017-mass constricting the superior vena cava, subcarinal adenopathy, right suprahilar/mediastinal mass.   12/26/2018status postbronchoscopy with transbronchial biopsy of level 7node and biopsy of aright upper lobe lung mass by Dr. GServando Snare There was compression of the right mainstem bronchus. A mass was noted in the right upper lobe. Mediastinal lymph nodes were seen on EBUS. Multiple biopsies of a level 7 node were obtained. Brushings and a biopsy of the right upper lobe mass were obtained. 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 Final pathology was nondiagnostic.   PET scan on 02/02/2017 showed hypermetabolism corresponding to the right sided mediastinal mass,progressive since the CT 01/19/2017. Metastatic disease/lymphoma within the bones, pericardium, stomach, bowel and abdominopelvic nodal stations.   Brain MRI 02/03/2017 showed no evidence of intracranial metastases or acute abnormality.   1/7/2019status postmediastinoscopy.  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 Molecular studies are pending.  Rituximab 02/08/2017  Cycle 1 CHOP 02/09/2017  Cycle 1 R-EPOCH 03/02/2017  Cycle2  R-EPOCH2/22/2019  Prophylactic intrathecal methotrexate 03/27/2017  Restaging PET scan 04/11/2017-significant decrease in FDG uptake associated with the chest, abdomen and pelvis and axial and proximal appendicular skeleton. No new foci of abnormal radiotracer uptake identified.  Cycle 3R-EPOCH3/15/2019  Cycle 4 R-EPOCH 05/04/2017  Cycle 5 R- EPOCH 05/25/2017  2. Chest/upper backand right armpain secondary to #1- resolved. 3. Exertional dyspnea secondary to #1 and COPD.Improved. 4. Asthma 5. Diabetes mellitus 6. Gout 7. Remote history of "carcinoid syndrome" 8. Remote history of tobacco use 9. Basal  cell carcinoma removed from the right face 10. Chronic low back pain status post epidural steroid injections-followed at a pain management clinic 11. 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 Constipation secondary to narcotic analgesics and potentially lymphoma involving the GI tract-improved. 13.Erythema/induration at the mediastinoscopy incision site-resolved.   Mr. Fiske appears stable.  He tolerated the last cycle of chemotherapy well.  He will be admitted today to complete the final planned cycle of R-EPOCH.  He will complete a final dose of intrathecal methotrexate prophylaxis with this cycle.  The CBC and chemistry panel appear adequate to begin chemotherapy.  We will check a chest x-ray to follow-up on the right chest density.  He will be maintained on prednisone throughout this admission.  We will monitor the blood sugar daily.      , MD  05/25/2017, 2:23 PM 

## 2017-05-26 LAB — GLUCOSE, CAPILLARY: GLUCOSE-CAPILLARY: 163 mg/dL — AB (ref 65–99)

## 2017-05-26 MED ORDER — PROCHLORPERAZINE MALEATE 10 MG PO TABS: 10.0000 mg | ORAL_TABLET | Freq: Four times a day (QID) | ORAL | Status: DC | PRN

## 2017-05-26 MED ORDER — SODIUM CHLORIDE 0.9 % IV SOLN
Freq: Once | INTRAVENOUS | Status: AC
  Administered 2017-05-26: 8 mg via INTRAVENOUS
  Filled 2017-05-26: qty 4

## 2017-05-26 MED ORDER — VINCRISTINE SULFATE CHEMO INJECTION 1 MG/ML
Freq: Once | INTRAVENOUS | Status: AC
  Administered 2017-05-26: 13:00:00 via INTRAVENOUS
  Filled 2017-05-26: qty 12

## 2017-05-26 MED ORDER — SORBITOL 70 % SOLN: 30.0000 mL | Freq: Every day | Status: DC | PRN

## 2017-05-26 MED ORDER — HYDROCODONE-ACETAMINOPHEN 5-325 MG PO TABS
1.0000 | ORAL_TABLET | Freq: Two times a day (BID) | ORAL | Status: DC | PRN
  Administered 2017-05-26: 1 via ORAL
  Filled 2017-05-26: qty 1

## 2017-05-26 NOTE — Progress Notes (Signed)
IP PROGRESS NOTE  Subjective:   Jeffery Wood is up in a chair this morning.  He is ambulating.  No nausea.  He complains of increased burning discomfort in the feet last night.  This made it difficult to sleep.  Gabapentin and tramadol did not relieve the discomfort.  He reports increased burning in the feet and tingling in the hands with each cycle of chemotherapy.  This improves when he is discharged from the hospital.  He reports the baseline hand tingling and foot burning have not changed significantly since prior to chemotherapy.  The neuropathy symptoms worsen with chemotherapy and improved between cycles.  Objective: Vital signs in last 24 hours: Blood pressure 134/73, pulse (!) 101, temperature 97.6 F (36.4 C), temperature source Oral, resp. rate 16, height 6' 3"  (1.905 m), weight 249 lb (112.9 kg), SpO2 97 %.  Intake/Output from previous day: 04/26 0701 - 04/27 0700 In: 748.1 [P.O.:240; I.V.:413.3; IV Piggyback:94.7] Out: -   Physical Exam:  Lungs: Clear bilaterally Cardiac: The rate and rhythm Abdomen: Soft and nontender Extremities: No leg edema Neurologic: Sensation is intact to light touch at the feet  Portacath/PICC-without erythema  Lab Results: Recent Labs    05/25/17 0806  WBC 7.4  HGB 12.3*  HCT 37.7*  PLT 278    BMET Recent Labs    05/25/17 0806  NA 139  K 4.1  CL 104  CO2 26  GLUCOSE 101*  BUN 12  CREATININE 0.72  CALCIUM 9.2    No results found for: CEA1  Studies/Results: X-ray Chest Pa And Lateral  Result Date: 05/25/2017 CLINICAL DATA:  History of right lung mass and non-Hodgkin's lymphoma. No current symptoms. EXAM: CHEST - 2 VIEW COMPARISON:  05/04/2017 FINDINGS: Power port in place. There appears to be slightly more deformity of the catheter as it passes underneath the clavicle than was seen on the previous study. Left lung remains clear. Right hilar prominence with right mid lung scarring or volume loss appears the same. No new  abnormality. No significant bone finding. IMPRESSION: Stable appearance of right hilar prominence and right perihilar pulmonary scarring or atelectasis. Power port tubing appears somewhat more deformed as it passes adjacent to and underneath the clavicle. Electronically Signed   By: Nelson Chimes M.D.   On: 05/25/2017 10:31    Medications: I have reviewed the patient's current medications.  Assessment/Plan:  1. High-grade B-cell non-Hodgkin's lymphoma, molecular studies pending, clinical stage IV, IPI-low intermediate risk, FISH panel revealed an 8q24/14q39fsion (MYC/IgH), negative for BCL 6 and BCL-2  Chest x-ray 01/15/2017-medial right upper lobe/perihilar mass.   Chest CT 01/19/2017-mass constricting the superior vena cava, subcarinal adenopathy, right suprahilar/mediastinal mass.   12/26/2018status postbronchoscopy with transbronchial biopsy of level 7node and biopsy of aright upper lobe lung mass by Dr. GServando Snare There was compression of the right mainstem bronchus. A mass was noted in the right upper lobe. Mediastinal lymph nodes were seen on EBUS. Multiple biopsies of a level 7 node were obtained. Brushings and a biopsy of the right upper lobe mass were obtained. 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 Final pathology was nondiagnostic.   PET scan on 02/02/2017 showed hypermetabolism corresponding to the right sided mediastinal mass,progressive since the CT 01/19/2017. Metastatic disease/lymphoma within the bones, pericardium, stomach, bowel and abdominopelvic nodal stations.   Brain MRI 02/03/2017 showed no evidence of intracranial metastases or acute abnormality.   1/7/2019status postmediastinoscopy.  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 Molecular studies are pending.  Rituximab 02/08/2017  Cycle 1 CHOP 02/09/2017  Cycle 1 R-EPOCH 03/02/2017  Cycle2 R-EPOCH2/22/2019  Prophylactic intrathecal  methotrexate 03/27/2017  Restaging PET scan 04/11/2017-significant decrease in FDG uptake associated with the chest, abdomen and pelvis and axial and proximal appendicular skeleton. No new foci of abnormal radiotracer uptake identified.  Cycle 3R-EPOCH3/15/2019  Cycle 4R-EPOCH4/05/2017  Cycle 5 R- EPOCH 05/25/2017  2. Chest/upper backand right armpain secondary to #1- resolved. 3. Exertional dyspnea secondary to #1 and COPD.Improved. 4. Asthma 5. Diabetes mellitus 6. Gout 7. Remote history of "carcinoid syndrome" 8. Remote history of tobacco use 9. Basal cell carcinoma removed from the right face 10. Chronic low back pain status post epidural steroid injections-followed at a pain management clinic 11. 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 Constipation secondary to narcotic analgesics and potentially lymphoma involving the GI tract-improved. 13.Erythema/induration at the mediastinoscopy incision site-resolved.   Jeffery Wood appears stable.  He is now at day 2 of chemotherapy.  He reports increased neuropathy symptoms in the feet during each cycle of chemotherapy.  We discussed the likelihood this is related to vincristine.  He understands the potential for long-lasting/permanent neuropathy with continuation of chemotherapy.  He reports the neuropathy symptoms improve between cycles of chemotherapy and have not changed significantly compared to baseline symptoms that predated chemotherapy.  He would like to continue the current chemotherapy regimen, including vincristine. We we will add hydrocodone to use as needed for neuropathy related pain.    LOS: 1 day   Betsy Coder, MD   05/26/2017, 7:54 AM

## 2017-05-27 LAB — GLUCOSE, CAPILLARY: Glucose-Capillary: 163 mg/dL — ABNORMAL HIGH (ref 65–99)

## 2017-05-27 MED ORDER — VINCRISTINE SULFATE CHEMO INJECTION 1 MG/ML
Freq: Once | INTRAVENOUS | Status: AC
  Administered 2017-05-27: 12:00:00 via INTRAVENOUS
  Filled 2017-05-27: qty 12

## 2017-05-27 MED ORDER — SODIUM CHLORIDE 0.9 % IV SOLN
Freq: Once | INTRAVENOUS | Status: AC
  Administered 2017-05-27: 8 mg via INTRAVENOUS
  Filled 2017-05-27: qty 4

## 2017-05-27 MED ORDER — HYDROCODONE-ACETAMINOPHEN 5-325 MG PO TABS
1.0000 | ORAL_TABLET | Freq: Two times a day (BID) | ORAL | Status: DC | PRN
  Administered 2017-05-27 – 2017-05-28 (×2): 1 via ORAL
  Filled 2017-05-27 (×2): qty 1

## 2017-05-27 NOTE — Progress Notes (Signed)
IP PROGRESS NOTE  Subjective:   Jeffery Wood reports improvement in the hand neuropathy symptoms when he used wrist braces yesterday.  He continues to have burning in the feet.  He was able to sleep after taking hydrocodone.  No nausea.  No other complaint.  He is ambulating in the hall. Objective: Vital signs in last 24 hours: Blood pressure 138/79, pulse 84, temperature 97.9 F (36.6 C), temperature source Oral, resp. rate 20, height 6' 3"  (1.905 m), weight 249 lb (112.9 kg), SpO2 98 %.  Intake/Output from previous day: 04/27 0701 - 04/28 0700 In: 133 [IV Piggyback:133] Out: -   Physical Exam:  Lungs: Clear bilaterally Cardiac: Regular rate and rhythm Abdomen: Soft and nontender, no hepatomegaly Extremities: No leg edema   Portacath/PICC-without erythema  Lab Results: Recent Labs    05/25/17 0806  WBC 7.4  HGB 12.3*  HCT 37.7*  PLT 278    BMET Recent Labs    05/25/17 0806  NA 139  K 4.1  CL 104  CO2 26  GLUCOSE 101*  BUN 12  CREATININE 0.72  CALCIUM 9.2    No results found for: CEA1  Studies/Results: X-ray Chest Pa And Lateral  Result Date: 05/25/2017 CLINICAL DATA:  History of right lung mass and non-Hodgkin's lymphoma. No current symptoms. EXAM: CHEST - 2 VIEW COMPARISON:  05/04/2017 FINDINGS: Power port in place. There appears to be slightly more deformity of the catheter as it passes underneath the clavicle than was seen on the previous study. Left lung remains clear. Right hilar prominence with right mid lung scarring or volume loss appears the same. No new abnormality. No significant bone finding. IMPRESSION: Stable appearance of right hilar prominence and right perihilar pulmonary scarring or atelectasis. Power port tubing appears somewhat more deformed as it passes adjacent to and underneath the clavicle. Electronically Signed   By: Nelson Chimes M.D.   On: 05/25/2017 10:31    Medications: I have reviewed the patient's current  medications.  Assessment/Plan:  1. High-grade B-cell non-Hodgkin's lymphoma, molecular studies pending, clinical stage IV, IPI-low intermediate risk, FISH panel revealed an 8q24/14q14fsion (MYC/IgH), negative for BCL 6 and BCL-2  Chest x-ray 01/15/2017-medial right upper lobe/perihilar mass.   Chest CT 01/19/2017-mass constricting the superior vena cava, subcarinal adenopathy, right suprahilar/mediastinal mass.   12/26/2018status postbronchoscopy with transbronchial biopsy of level 7node and biopsy of aright upper lobe lung mass by Dr. GServando Snare There was compression of the right mainstem bronchus. A mass was noted in the right upper lobe. Mediastinal lymph nodes were seen on EBUS. Multiple biopsies of a level 7 node were obtained. Brushings and a biopsy of the right upper lobe mass were obtained. 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 Final pathology was nondiagnostic.   PET scan on 02/02/2017 showed hypermetabolism corresponding to the right sided mediastinal mass,progressive since the CT 01/19/2017. Metastatic disease/lymphoma within the bones, pericardium, stomach, bowel and abdominopelvic nodal stations.   Brain MRI 02/03/2017 showed no evidence of intracranial metastases or acute abnormality.   1/7/2019status postmediastinoscopy.  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 Molecular studies are pending.  Rituximab 02/08/2017  Cycle 1 CHOP 02/09/2017  Cycle 1 R-EPOCH 03/02/2017  Cycle2 R-EPOCH2/22/2019  Prophylactic intrathecal methotrexate 03/27/2017  Restaging PET scan 04/11/2017-significant decrease in FDG uptake associated with the chest, abdomen and pelvis and axial and proximal appendicular skeleton. No new foci of abnormal radiotracer uptake identified.  Cycle 3R-EPOCH3/15/2019  Cycle 4R-EPOCH4/05/2017  Cycle 5 R- EPOCH 05/25/2017  2.  Chest/upper backand right armpain secondary to #1-  resolved. 3. Exertional dyspnea secondary to #1 and COPD.Improved. 4. Asthma 5. Diabetes mellitus 6. Gout 7. Remote history of "carcinoid syndrome" 8. Remote history of tobacco use 9. Basal cell carcinoma removed from the right face 10. Chronic low back pain status post epidural steroid injections-followed at a pain management clinic 11. 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 Constipation secondary to narcotic analgesics and potentially lymphoma involving the GI tract-improved. 13.Erythema/induration at the mediastinoscopy incision site-resolved.   Jeffery Wood appears stable.  He is now at day 3 of chemotherapy.  He is scheduled to receive intrathecal methotrexate tomorrow.  He appears to be tolerating the chemotherapy without acute toxicity.  The CBG is mildly elevated while on prednisone.  The plan is to continue chemotherapy as scheduled.   LOS: 2 days   Betsy Coder, MD   05/27/2017, 7:49 AM

## 2017-05-28 ENCOUNTER — Encounter (HOSPITAL_COMMUNITY): Payer: Self-pay | Admitting: *Deleted

## 2017-05-28 ENCOUNTER — Inpatient Hospital Stay (HOSPITAL_COMMUNITY): Payer: BC Managed Care – PPO

## 2017-05-28 LAB — GLUCOSE, CAPILLARY: GLUCOSE-CAPILLARY: 167 mg/dL — AB (ref 65–99)

## 2017-05-28 LAB — CSF CELL COUNT WITH DIFFERENTIAL
RBC COUNT CSF: 4 /mm3 — AB
TUBE #: 1
WBC CSF: 0 /mm3 (ref 0–5)

## 2017-05-28 LAB — PROTEIN, CSF: Total  Protein, CSF: 48 mg/dL — ABNORMAL HIGH (ref 15–45)

## 2017-05-28 MED ORDER — LIDOCAINE HCL 1 % IJ SOLN
INTRAMUSCULAR | Status: AC
  Administered 2017-05-28: 8 mL
  Filled 2017-05-28: qty 40

## 2017-05-28 MED ORDER — SODIUM CHLORIDE 0.9 % IJ SOLN
Freq: Once | INTRAMUSCULAR | Status: AC
  Administered 2017-05-28: 13:00:00 via INTRATHECAL
  Filled 2017-05-28: qty 0.48

## 2017-05-28 MED ORDER — SODIUM CHLORIDE 0.9 % IV SOLN
Freq: Once | INTRAVENOUS | Status: AC
  Administered 2017-05-28: 8 mg via INTRAVENOUS
  Filled 2017-05-28: qty 4

## 2017-05-28 MED ORDER — VINCRISTINE SULFATE CHEMO INJECTION 1 MG/ML
Freq: Once | INTRAVENOUS | Status: AC
  Administered 2017-05-28: 13:00:00 via INTRAVENOUS
  Filled 2017-05-28: qty 12

## 2017-05-28 NOTE — Progress Notes (Signed)
Doxorubicin, Vepesid and Oncovin dosages and dilutions verified with Reyne Dumas, RN.

## 2017-05-28 NOTE — Procedures (Signed)
Procedure: Fluoro guided LP and intrathecal chemo injection at L3-4. Specimen: CSF, approx 10 mL for requested cytology analysis Bleeding: minimal. Complications: None immediate. Patient   -Condition: Stable.  -Disposition:  Inpatient, return to floor.  Full Radiology report to follow under IMAGING

## 2017-05-28 NOTE — Progress Notes (Signed)
IP PROGRESS NOTE  Subjective:   Jeffery Wood has no new complaint.  No nausea.  He is ambulating. Objective: Vital signs in last 24 hours: Blood pressure (!) 153/83, pulse 93, temperature 98.1 F (36.7 C), temperature source Oral, resp. rate 20, height 6' 3"  (1.905 m), weight 249 lb (112.9 kg), SpO2 98 %.  Intake/Output from previous day: 04/28 0701 - 04/29 0700 In: 1715.1 [I.V.:1258.7; IV Piggyback:456.4] Out: -   Physical Exam: HEENT: No thrush Lungs: Clear bilaterally Cardiac: Regular rate and rhythm  Extremities: No leg edema   Portacath/PICC-without erythema   Medications: I have reviewed the patient's current medications.  Assessment/Plan:  1. High-grade B-cell non-Hodgkin's lymphoma, molecular studies pending, clinical stage IV, IPI-low intermediate risk, FISH panel revealed an 8q24/14q74fsion (MYC/IgH), negative for BCL 6 and BCL-2  Chest x-ray 01/15/2017-medial right upper lobe/perihilar mass.   Chest CT 01/19/2017-mass constricting the superior vena cava, subcarinal adenopathy, right suprahilar/mediastinal mass.   12/26/2018status postbronchoscopy with transbronchial biopsy of level 7node and biopsy of aright upper lobe lung mass by Dr. GServando Snare There was compression of the right mainstem bronchus. A mass was noted in the right upper lobe. Mediastinal lymph nodes were seen on EBUS. Multiple biopsies of a level 7 node were obtained. Brushings and a biopsy of the right upper lobe mass were obtained. 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 Final pathology was nondiagnostic.   PET scan on 02/02/2017 showed hypermetabolism corresponding to the right sided mediastinal mass,progressive since the CT 01/19/2017. Metastatic disease/lymphoma within the bones, pericardium, stomach, bowel and abdominopelvic nodal stations.   Brain MRI 02/03/2017 showed no evidence of intracranial metastases or acute abnormality.   1/7/2019status  postmediastinoscopy.  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 Molecular studies are pending.  Rituximab 02/08/2017  Cycle 1 CHOP 02/09/2017  Cycle 1 R-EPOCH 03/02/2017  Cycle2 R-EPOCH2/22/2019  Prophylactic intrathecal methotrexate 03/27/2017  Restaging PET scan 04/11/2017-significant decrease in FDG uptake associated with the chest, abdomen and pelvis and axial and proximal appendicular skeleton. No new foci of abnormal radiotracer uptake identified.  Cycle 3R-EPOCH3/15/2019  Cycle 4R-EPOCH4/05/2017  Cycle 5 R- EPOCH 05/25/2017  2. Chest/upper backand right armpain secondary to #1- resolved. 3. Exertional dyspnea secondary to #1 and COPD.Improved. 4. Asthma 5. Diabetes mellitus 6. Gout 7. Remote history of "carcinoid syndrome" 8. Remote history of tobacco use 9. Basal cell carcinoma removed from the right face 10. Chronic low back pain status post epidural steroid injections-followed at a pain management clinic 11. 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 Constipation secondary to narcotic analgesics and potentially lymphoma involving the GI tract-improved. 13.Erythema/induration at the mediastinoscopy incision site-resolved.   Mr. WLongeneckerappears stable.  He is now at day 4 of chemotherapy.  He is scheduled to receive intrathecal methotrexate today.  He appears to be tolerating the chemotherapy without acute toxicity.  The CBG is stable. The plan is to continue chemotherapy as scheduled.  We will begin discharge planning for 05/29/2017.   LOS: 3 days   GBetsy Coder MD   05/28/2017, 8:19 AM

## 2017-05-29 ENCOUNTER — Other Ambulatory Visit: Payer: Self-pay | Admitting: Nurse Practitioner

## 2017-05-29 DIAGNOSIS — M792 Neuralgia and neuritis, unspecified: Secondary | ICD-10-CM

## 2017-05-29 DIAGNOSIS — C8590 Non-Hodgkin lymphoma, unspecified, unspecified site: Secondary | ICD-10-CM

## 2017-05-29 LAB — GLUCOSE, CAPILLARY: Glucose-Capillary: 219 mg/dL — ABNORMAL HIGH (ref 65–99)

## 2017-05-29 MED ORDER — SODIUM CHLORIDE 0.9 % IV SOLN
750.0000 mg/m2 | Freq: Once | INTRAVENOUS | Status: AC
  Administered 2017-05-29: 1840 mg via INTRAVENOUS
  Filled 2017-05-29: qty 92

## 2017-05-29 MED ORDER — HYDROCODONE-ACETAMINOPHEN 5-325 MG PO TABS: 1.0000 | ORAL_TABLET | Freq: Two times a day (BID) | ORAL | 0 refills | Status: DC | PRN

## 2017-05-29 MED ORDER — GABAPENTIN 300 MG PO CAPS: 300.0000 mg | ORAL_CAPSULE | Freq: Two times a day (BID) | ORAL | 0 refills | Status: DC

## 2017-05-29 MED ORDER — HEPARIN SOD (PORK) LOCK FLUSH 100 UNIT/ML IV SOLN
500.0000 [IU] | Freq: Once | INTRAVENOUS | Status: DC
  Filled 2017-05-29: qty 5

## 2017-05-29 MED ORDER — SODIUM CHLORIDE 0.9 % IV SOLN
Freq: Once | INTRAVENOUS | Status: AC
  Administered 2017-05-29: 16 mg via INTRAVENOUS
  Filled 2017-05-29: qty 8

## 2017-05-29 NOTE — Discharge Summary (Addendum)
Physician Discharge Summary  Patient ID: Jeffery Wood  MRN: [id number] DOB/AGE: [id number] y.o.  Admit date: 05/25/2017 Discharge date: 05/29/2017    Discharge Diagnoses:  Active Problems:   Non Hodgkin's lymphoma ([place])   Discharged Condition: stable  Discharge Labs: None  Significant Diagnostic Studies: none  Consults: none  Procedures:  1. Infusional chemotherapy 2. Intrathecal chemotherapy  Disposition:  Discharge home   Allergies as of 05/29/2017   No Known Allergies     Medication List    TAKE these medications   acetaminophen 650 MG CR tablet Commonly known as:  TYLENOL Take 1,300 mg by mouth 2 (two) times daily as needed for pain.   albuterol (2.5 MG/3ML) 0.083% nebulizer solution Commonly known as:  PROVENTIL Take 2.5 mg by nebulization at bedtime as needed for wheezing or shortness of breath.   aspirin EC 81 MG tablet Take 81 mg by mouth daily.   betamethasone dipropionate 0.05 % cream Commonly known as:  DIPROLENE Apply 1 application topically daily as needed (for psoriasis).   BLINK TEARS OP Apply 1 drop to eye daily as needed (allergies).   celecoxib 200 MG capsule Commonly known as:  CELEBREX Take 200 mg by mouth 2 (two) times daily.   clonazePAM 0.5 MG tablet Commonly known as:  KLONOPIN Take 0.5 mg by mouth at bedtime.   etodolac 400 MG tablet Commonly known as:  LODINE Take 400 mg by mouth 3 (three) times daily as needed (gout pain).   Fluticasone-Salmeterol 100-50 MCG/DOSE Aepb Commonly known as:  ADVAIR Inhale 1 puff into the lungs daily.   gabapentin 300 MG capsule Commonly known as:  NEURONTIN Take 1 capsule (300 mg total) by mouth 2 (two) times daily.   glimepiride 2 MG tablet Commonly known as:  AMARYL Take 2 mg by mouth 2 (two) times daily before a meal.   HYDROcodone-acetaminophen 5-325 MG tablet Commonly known as:  NORCO/VICODIN Take 1 tablet by mouth 2 (two) times daily as needed for moderate pain (when  tramadol at hs prn is not sufficient). What changed:    when to take this  reasons to take this   levalbuterol 45 MCG/ACT inhaler Commonly known as:  XOPENEX HFA Inhale 2 puffs into the lungs daily as needed for wheezing or shortness of breath.   lidocaine-prilocaine cream Commonly known as:  EMLA Apply to port site one hour prior to use. Do not rub in. Cover with plastic.   loratadine 10 MG tablet Commonly known as:  CLARITIN Take 10 mg by mouth daily as needed for allergies.   losartan 100 MG tablet Commonly known as:  COZAAR Take 100 mg by mouth at bedtime.   metFORMIN 1000 MG tablet Commonly known as:  GLUCOPHAGE Take 1,000 mg by mouth 2 (two) times daily with a meal.   multivitamins ther. w/minerals Tabs tablet Take 2 tablets by mouth daily.   omeprazole 20 MG capsule Commonly known as:  PRILOSEC Take 20 mg by mouth 2 (two) times daily.   polyethylene glycol packet Commonly known as:  MIRALAX Take 17 g by mouth daily. What changed:    when to take this  reasons to take this   prochlorperazine 10 MG tablet Commonly known as:  COMPAZINE Take 1 tablet (10 mg total) by mouth every 6 (six) hours as needed for nausea or vomiting.   senna 8.6 MG Tabs tablet Commonly known as:  SENOKOT Take 1 tablet (8.6 mg total) by mouth 2 (two) times daily. What changed:  when to take this  reasons to take this   tiotropium 18 MCG inhalation capsule Commonly known as:  SPIRIVA Place 18 mcg into inhaler and inhale daily.   traMADol 50 MG tablet Commonly known as:  ULTRAM Take 50 mg by mouth at bedtime as needed for moderate pain.   trolamine salicylate 10 % cream Commonly known as:  ASPERCREME Apply 1 application topically 2 (two) times daily as needed for muscle pain.         Hospital Course: Jeffery Wood is a 62 year old man with non-Hodgkin's lymphoma.  He was admitted electively on 05/25/2017 to complete cycle 6 of systemic therapy with rituximab/EPOCH.  He  completed infusional chemotherapy beginning 05/25/2017 and ending on 05/29/2017.  He received cyclophosphamide prior to discharge on 05/29/2017.  He completed cycle 4 prophylactic intrathecal methotrexate on 05/28/2017.  He tolerated the chemotherapy without significant acute toxicity.  Jeffery Wood appears stable for discharge at the completion of chemotherapy 05/29/2017.  He will be scheduled for a Neulasta injection 05/30/2017.  We will see him in follow-up with labs on 06/19/2017.     Signed: Ned Card 05/29/2017, 9:08 AM  Jeffery Wood was examined on the day of discharge.  He tolerated the chemotherapy without significant acute toxicity.  No headache following intrathecal methotrexate.  He appears stable for discharge following the completion of chemotherapy on 05/29/2017.

## 2017-05-29 NOTE — Progress Notes (Signed)
Dosage and dilution for Cyclophosphamide verified with Reyne Dumas, RN.

## 2017-05-29 NOTE — Discharge Instructions (Signed)
neulasta injection on 5/1 Lab and office visit on 5/21 Call with fever, chills, bleeding

## 2017-05-30 ENCOUNTER — Other Ambulatory Visit: Payer: Self-pay | Admitting: *Deleted

## 2017-05-30 ENCOUNTER — Inpatient Hospital Stay: Payer: BC Managed Care – PPO | Attending: Oncology

## 2017-05-30 VITALS — BP 155/80 | HR 88 | Temp 98.1°F | Resp 18

## 2017-05-30 DIAGNOSIS — E119 Type 2 diabetes mellitus without complications: Secondary | ICD-10-CM | POA: Insufficient documentation

## 2017-05-30 DIAGNOSIS — Z5189 Encounter for other specified aftercare: Secondary | ICD-10-CM | POA: Insufficient documentation

## 2017-05-30 DIAGNOSIS — L03039 Cellulitis of unspecified toe: Secondary | ICD-10-CM | POA: Diagnosis not present

## 2017-05-30 DIAGNOSIS — Z87891 Personal history of nicotine dependence: Secondary | ICD-10-CM | POA: Diagnosis not present

## 2017-05-30 DIAGNOSIS — Z85828 Personal history of other malignant neoplasm of skin: Secondary | ICD-10-CM | POA: Diagnosis not present

## 2017-05-30 DIAGNOSIS — C8330 Diffuse large B-cell lymphoma, unspecified site: Secondary | ICD-10-CM

## 2017-05-30 DIAGNOSIS — G620 Drug-induced polyneuropathy: Secondary | ICD-10-CM | POA: Diagnosis not present

## 2017-05-30 DIAGNOSIS — G8929 Other chronic pain: Secondary | ICD-10-CM | POA: Diagnosis not present

## 2017-05-30 DIAGNOSIS — K5903 Drug induced constipation: Secondary | ICD-10-CM | POA: Diagnosis not present

## 2017-05-30 DIAGNOSIS — J449 Chronic obstructive pulmonary disease, unspecified: Secondary | ICD-10-CM | POA: Insufficient documentation

## 2017-05-30 DIAGNOSIS — M109 Gout, unspecified: Secondary | ICD-10-CM | POA: Diagnosis not present

## 2017-05-30 DIAGNOSIS — M545 Low back pain: Secondary | ICD-10-CM | POA: Diagnosis not present

## 2017-05-30 DIAGNOSIS — C8590 Non-Hodgkin lymphoma, unspecified, unspecified site: Secondary | ICD-10-CM | POA: Diagnosis present

## 2017-05-30 MED ORDER — PEGFILGRASTIM INJECTION 6 MG/0.6ML ~~LOC~~
PREFILLED_SYRINGE | SUBCUTANEOUS | Status: AC
  Filled 2017-05-30: qty 0.6

## 2017-05-30 MED ORDER — PEGFILGRASTIM INJECTION 6 MG/0.6ML ~~LOC~~
6.0000 mg | PREFILLED_SYRINGE | Freq: Once | SUBCUTANEOUS | Status: AC
  Administered 2017-05-30: 6 mg via SUBCUTANEOUS

## 2017-06-19 ENCOUNTER — Inpatient Hospital Stay: Payer: BC Managed Care – PPO

## 2017-06-19 ENCOUNTER — Telehealth: Payer: Self-pay

## 2017-06-19 ENCOUNTER — Inpatient Hospital Stay (HOSPITAL_BASED_OUTPATIENT_CLINIC_OR_DEPARTMENT_OTHER): Payer: BC Managed Care – PPO | Admitting: Oncology

## 2017-06-19 VITALS — BP 120/83 | HR 98 | Temp 98.2°F | Resp 18 | Ht 75.0 in | Wt 253.9 lb

## 2017-06-19 DIAGNOSIS — C8330 Diffuse large B-cell lymphoma, unspecified site: Secondary | ICD-10-CM

## 2017-06-19 DIAGNOSIS — G620 Drug-induced polyneuropathy: Secondary | ICD-10-CM | POA: Diagnosis not present

## 2017-06-19 DIAGNOSIS — K5903 Drug induced constipation: Secondary | ICD-10-CM | POA: Diagnosis not present

## 2017-06-19 DIAGNOSIS — G8929 Other chronic pain: Secondary | ICD-10-CM | POA: Diagnosis not present

## 2017-06-19 DIAGNOSIS — Z87891 Personal history of nicotine dependence: Secondary | ICD-10-CM

## 2017-06-19 DIAGNOSIS — C8590 Non-Hodgkin lymphoma, unspecified, unspecified site: Secondary | ICD-10-CM

## 2017-06-19 DIAGNOSIS — M545 Low back pain: Secondary | ICD-10-CM

## 2017-06-19 DIAGNOSIS — Z85828 Personal history of other malignant neoplasm of skin: Secondary | ICD-10-CM

## 2017-06-19 DIAGNOSIS — E119 Type 2 diabetes mellitus without complications: Secondary | ICD-10-CM | POA: Diagnosis not present

## 2017-06-19 DIAGNOSIS — J449 Chronic obstructive pulmonary disease, unspecified: Secondary | ICD-10-CM | POA: Diagnosis not present

## 2017-06-19 DIAGNOSIS — M109 Gout, unspecified: Secondary | ICD-10-CM

## 2017-06-19 DIAGNOSIS — L03039 Cellulitis of unspecified toe: Secondary | ICD-10-CM | POA: Diagnosis not present

## 2017-06-19 LAB — CBC WITH DIFFERENTIAL (CANCER CENTER ONLY)
Basophils Absolute: 0.1 10*3/uL (ref 0.0–0.1)
Basophils Relative: 2 %
EOS ABS: 0.1 10*3/uL (ref 0.0–0.5)
Eosinophils Relative: 1 %
HCT: 39.6 % (ref 38.4–49.9)
Hemoglobin: 13.1 g/dL (ref 13.0–17.1)
LYMPHS ABS: 1.5 10*3/uL (ref 0.9–3.3)
Lymphocytes Relative: 21 %
MCH: 30.6 pg (ref 27.2–33.4)
MCHC: 33.1 g/dL (ref 32.0–36.0)
MCV: 92.5 fL (ref 79.3–98.0)
MONOS PCT: 7 %
Monocytes Absolute: 0.5 10*3/uL (ref 0.1–0.9)
NEUTROS ABS: 5 10*3/uL (ref 1.5–6.5)
NEUTROS PCT: 69 %
PLATELETS: 276 10*3/uL (ref 140–400)
RBC: 4.28 MIL/uL (ref 4.20–5.82)
RDW: 15.8 % — AB (ref 11.0–14.6)
WBC: 7.2 10*3/uL (ref 4.0–10.3)

## 2017-06-19 MED ORDER — HYDROCODONE-ACETAMINOPHEN 5-325 MG PO TABS: 1.0000 | ORAL_TABLET | Freq: Two times a day (BID) | ORAL | 0 refills | Status: DC | PRN

## 2017-06-19 MED ORDER — SODIUM CHLORIDE 0.9% FLUSH
10.0000 mL | INTRAVENOUS | Status: DC | PRN
  Administered 2017-06-19: 10 mL via INTRAVENOUS
  Filled 2017-06-19: qty 10

## 2017-06-19 MED ORDER — DOXYCYCLINE HYCLATE 100 MG PO TABS: 100.0000 mg | ORAL_TABLET | Freq: Two times a day (BID) | ORAL | 0 refills | Status: DC

## 2017-06-19 NOTE — Telephone Encounter (Signed)
Printed avs and calender of upcoming appointment.  Per 5/21 los 

## 2017-06-19 NOTE — Progress Notes (Signed)
[facility] OFFICE PROGRESS NOTE   Diagnosis: Non-Hodgkin's lymphoma  INTERVAL HISTORY:   Jeffery Wood was discharged from the hospital 05/29/2017 after completing a final cycle of R-EPOCH with intrathecal methotrexate prophylaxis.  He reports an episode of exertional dyspnea lasting for 1 day.  He has chronic back pain and burning in the feet.  He takes hydrocodone and gabapentin for pain.  He complains of constipation, not relieved with a stool softener regimen. He trimmed his right great toenail and developed a purulent discharge and erythema of the toe.  This has persisted.  Most of the toenail is gone.  Objective:  Vital signs in last 24 hours:  Blood pressure 120/83, pulse 98, temperature 98.2 F (36.8 C), temperature source Oral, resp. rate 18, height 6' 3"  (1.905 m), weight 253 lb 14.4 oz (115.2 kg), SpO2 96 %.    HEENT: No thrush or ulcers Resp: End inspiratory rales at the right posterior base, no respiratory distress Cardio: Regular rate and rhythm GI: No hepatosplenomegaly, nontender Vascular: No leg edema  Skin: Greater than one half of the right great toenail is missing, there is a purulent discharge in the nailbed, mild erythema of the distal aspect of the right great toe.  No pain with motion at the distal toe joint  Portacath/PICC-without erythema  Lab Results:  Lab Results  Component Value Date   WBC 7.2 06/19/2017   HGB 13.1 06/19/2017   HCT 39.6 06/19/2017   MCV 92.5 06/19/2017   PLT 276 06/19/2017   NEUTROABS 5.0 06/19/2017    CMP     Component Value Date/Time   NA 139 05/25/2017 0806   K 4.1 05/25/2017 0806   CL 104 05/25/2017 0806   CO2 26 05/25/2017 0806   GLUCOSE 101 (H) 05/25/2017 0806   BUN 12 05/25/2017 0806   CREATININE 0.72 05/25/2017 0806   CREATININE 0.71 04/26/2017 0818   CALCIUM 9.2 05/25/2017 0806   PROT 6.0 (L) 05/25/2017 0806   ALBUMIN 3.6 05/25/2017 0806   AST 25 05/25/2017 0806   AST 21 04/26/2017 0818   ALT 23  05/25/2017 0806   ALT 41 04/26/2017 0818   ALKPHOS 72 05/25/2017 0806   BILITOT 0.2 (L) 05/25/2017 0806   BILITOT 0.4 04/26/2017 0818   GFRNONAA >60 05/25/2017 0806   GFRNONAA >60 04/26/2017 0818   GFRAA >60 05/25/2017 0806   GFRAA >60 04/26/2017 0818     Medications: I have reviewed the patient's current medications.   Assessment/Plan: 1. High-grade B-cell non-Hodgkin's lymphoma, molecular studies pending, clinical stage IV, IPI-low intermediate risk, FISH panel revealed an 8q24/14q63fsion (MYC/IgH), negative for BCL 6 and BCL-2  Chest x-ray 01/15/2017-medial right upper lobe/perihilar mass.   Chest CT 01/19/2017-mass constricting the superior vena cava, subcarinal adenopathy, right suprahilar/mediastinal mass.   12/26/2018status postbronchoscopy with transbronchial biopsy of level 7node and biopsy of aright upper lobe lung mass by Dr. GServando Snare There was compression of the right mainstem bronchus. A mass was noted in the right upper lobe. Mediastinal lymph nodes were seen on EBUS. Multiple biopsies of a level 7 node were obtained. Brushings and a biopsy of the right upper lobe mass were obtained. 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 Final pathology was nondiagnostic.   PET scan on 02/02/2017 showed hypermetabolism corresponding to the right sided mediastinal mass,progressive since the CT 01/19/2017. Metastatic disease/lymphoma within the bones, pericardium, stomach, bowel and abdominopelvic nodal stations.   Brain MRI 02/03/2017 showed no evidence of intracranial metastases or acute  abnormality.   1/7/2019status postmediastinoscopy.  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 Molecular studies are pending.  Rituximab 02/08/2017  Cycle 1 CHOP 02/09/2017  Cycle 1 R-EPOCH 03/02/2017  Cycle2 R-EPOCH2/22/2019  Prophylactic intrathecal methotrexate 03/27/2017  Restaging PET scan 04/11/2017-significant  decrease in FDG uptake associated with the chest, abdomen and pelvis and axial and proximal appendicular skeleton. No new foci of abnormal radiotracer uptake identified.  Cycle 3R-EPOCH3/15/2019 with intrathecal methotrexate prophylaxis  Cycle 4R-EPOCH4/05/2017 with intrathecal methotrexate prophylaxis  Cycle 5 R-EPOCH4/26/2019 with intrathecal methotrexate prophylaxis  2. Chest/upper backand right armpain secondary to #1- resolved. 3. Exertional dyspnea secondary to #1 and COPD.Improved. 4. Asthma 5. Diabetes mellitus 6. Gout 7. Remote history of "carcinoid syndrome" 8. Remote history of tobacco use 9. Basal cell carcinoma removed from the right face 10. Chronic low back pain status post epidural steroid injections-followed at a pain management clinic 11. 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 Constipation secondary to narcotic analgesics and potentially lymphoma involving the GI tract-improved. 13.  Right first toenail infection 06/19/2017- prescribe doxycycline      Disposition: Mr. Telford is in clinical remission from non-Hodgkin's lymphoma.  He has multiple chronic medical conditions including diabetes, COPD, chronic pain, and neuropathy.  He will address these issues with his internal medicine physician.  He appears to have an infection at the right great toe.  He will complete a course of doxycycline.  The Port-A-Cath remains in place.  He will return for an office visit and Port-A-Cath flush in 6 weeks.  He plans to schedule appoint with Dr. Watt Climes to discuss the constipation and schedule a colonoscopy.  25 minutes were spent with the patient today.  The majority of the time was used for counseling and coordination of care.  Betsy Coder, MD  06/19/2017  11:16 AM

## 2017-07-27 ENCOUNTER — Telehealth: Payer: Self-pay

## 2017-07-27 ENCOUNTER — Inpatient Hospital Stay: Payer: BC Managed Care – PPO

## 2017-07-27 ENCOUNTER — Inpatient Hospital Stay: Payer: BC Managed Care – PPO | Attending: Oncology | Admitting: Oncology

## 2017-07-27 VITALS — BP 113/72 | HR 96 | Temp 98.9°F | Resp 17 | Ht 75.0 in | Wt 247.3 lb

## 2017-07-27 DIAGNOSIS — G8929 Other chronic pain: Secondary | ICD-10-CM

## 2017-07-27 DIAGNOSIS — M545 Low back pain: Secondary | ICD-10-CM | POA: Diagnosis not present

## 2017-07-27 DIAGNOSIS — C8330 Diffuse large B-cell lymphoma, unspecified site: Secondary | ICD-10-CM

## 2017-07-27 DIAGNOSIS — C8518 Unspecified B-cell lymphoma, lymph nodes of multiple sites: Secondary | ICD-10-CM | POA: Diagnosis present

## 2017-07-27 DIAGNOSIS — J449 Chronic obstructive pulmonary disease, unspecified: Secondary | ICD-10-CM | POA: Diagnosis not present

## 2017-07-27 MED ORDER — SODIUM CHLORIDE 0.9% FLUSH
10.0000 mL | INTRAVENOUS | Status: DC | PRN
  Administered 2017-07-27: 10 mL via INTRAVENOUS
  Filled 2017-07-27: qty 10

## 2017-07-27 MED ORDER — HEPARIN SOD (PORK) LOCK FLUSH 100 UNIT/ML IV SOLN
500.0000 [IU] | Freq: Once | INTRAVENOUS | Status: AC | PRN
  Administered 2017-07-27: 500 [IU] via INTRAVENOUS
  Filled 2017-07-27: qty 5

## 2017-07-27 NOTE — Progress Notes (Signed)
Telephone call made to [facility]. Appt set for July 26 at 11am. Patient advised of appt date and time.

## 2017-07-27 NOTE — Progress Notes (Signed)
Jeffery Wood returns as scheduled.  Good appetite.  His bowels are moving.  He has noted discomfort at the right upper arm for the past few days.  No fever.  He has sweats around the head at night.  He has chronic back pain and neuropathy symptoms in the feet.  He is active working at home.  His energy level is not back to baseline.   Objective:  Vital signs in last 24 hours:  Blood pressure 113/72, pulse 96, temperature 98.9 F (37.2 C), temperature source Oral, resp. rate 17, height 6' 3"  (1.905 m), weight 247 lb 4.8 oz (112.2 kg), SpO2 97 %.    HEENT: Neck without mass Lymphatics: No cervical, supraclavicular, axillary, or inguinal nodes Resp: Lungs clear bilaterally Cardio: Regular rate and rhythm GI: No hepatosplenomegaly, nontender Vascular: No leg edema Musculoskeletal: No pain with motion at the right shoulder.  No tenderness at the right arm Skin: Dryness of the feet and toes, breakdown the great toenail bilaterally, no evidence of infection  Portacath/PICC-without erythema  Lab Results:  Lab Results  Component Value Date   WBC 7.2 06/19/2017   HGB 13.1 06/19/2017   HCT 39.6 06/19/2017   MCV 92.5 06/19/2017   PLT 276 06/19/2017   NEUTROABS 5.0 06/19/2017     Medications: I have reviewed the patient's current medications.   Assessment/Plan: 1. High-grade B-cell non-Hodgkin's lymphoma, molecular studies pending, clinical stage IV, IPI-low intermediate risk, FISH panel revealed an 8q24/14q20fsion (MYC/IgH), negative for BCL 6 and BCL-2  Chest x-ray 01/15/2017-medial right upper lobe/perihilar mass.   Chest CT 01/19/2017-mass constricting the superior vena cava, subcarinal adenopathy, right suprahilar/mediastinal mass.   12/26/2018status postbronchoscopy with transbronchial biopsy of level 7node and biopsy of aright upper lobe lung mass by Dr.  GServando Snare There was compression of the right mainstem bronchus. A mass was noted in the right upper lobe. Mediastinal lymph nodes were seen on EBUS. Multiple biopsies of a level 7 node were obtained. Brushings and a biopsy of the right upper lobe mass were obtained. 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 Final pathology was nondiagnostic.   PET scan on 02/02/2017 showed hypermetabolism corresponding to the right sided mediastinal mass,progressive since the CT 01/19/2017. Metastatic disease/lymphoma within the bones, pericardium, stomach, bowel and abdominopelvic nodal stations.   Brain MRI 02/03/2017 showed no evidence of intracranial metastases or acute abnormality.   1/7/2019status postmediastinoscopy.  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 Molecular studies are pending.  Rituximab 02/08/2017  Cycle 1 CHOP 02/09/2017  Cycle 1 R-EPOCH 03/02/2017  Cycle2 R-EPOCH2/22/2019  Prophylactic intrathecal methotrexate 03/27/2017  Restaging PET scan 04/11/2017-significant decrease in FDG uptake associated with the chest, abdomen and pelvis and axial and proximal appendicular skeleton. No new foci of abnormal radiotracer uptake identified.  Cycle 3R-EPOCH3/15/2019 with intrathecal methotrexate prophylaxis  Cycle 4R-EPOCH4/05/2017 with intrathecal methotrexate prophylaxis  Cycle 5 R-EPOCH4/26/2019 with intrathecal methotrexate prophylaxis  2. Chest/upper backand right armpain secondary to #1- resolved. 3. Exertional dyspnea secondary to #1 and COPD.Improved. 4. Asthma 5. Diabetes mellitus 6. Gout 7. Remote history of "carcinoid syndrome" 8. Remote history of tobacco use 9. Basal cell carcinoma removed from the right face 10. Chronic low back pain status post epidural steroid injections-followed at a pain management clinic 11. 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 Constipation secondary to narcotic analgesics and  potentially lymphoma involving the GI tract-improved. 13.  Right first  toenail infection 06/19/2017- prescribed doxycycline    Disposition: Jeffery Wood is in clinical remission from non-Hodgkin's lymphoma.  He will follow-up with the pain clinic for management of chronic back pain.  He will return for a Port-A-Cath flush and office visit in 6 weeks.  15 minutes were spent with the patient today.  The majority of the time was used for counseling and coordination of care.  Betsy Coder, MD  07/27/2017  9:39 AM

## 2017-07-27 NOTE — Telephone Encounter (Signed)
Printed avs and calender of upcoming appointment per 6/28 los

## 2017-07-27 NOTE — Telephone Encounter (Signed)
Printed avs and calender of upcoming appointment. Per 6/28 los 

## 2017-07-30 ENCOUNTER — Other Ambulatory Visit: Payer: Self-pay | Admitting: Anesthesiology

## 2017-07-30 DIAGNOSIS — M4726 Other spondylosis with radiculopathy, lumbar region: Secondary | ICD-10-CM

## 2017-08-24 ENCOUNTER — Ambulatory Visit: Payer: BC Managed Care – PPO | Admitting: Family Medicine

## 2017-08-30 ENCOUNTER — Ambulatory Visit
Admission: RE | Admit: 2017-08-30 | Discharge: 2017-08-30 | Disposition: A | Discharge status: Home / Self Care | Payer: BC Managed Care – PPO | Source: Ambulatory Visit | Attending: Anesthesiology | Admitting: Anesthesiology

## 2017-08-30 DIAGNOSIS — M4726 Other spondylosis with radiculopathy, lumbar region: Secondary | ICD-10-CM

## 2017-09-07 ENCOUNTER — Telehealth: Payer: Self-pay | Admitting: Oncology

## 2017-09-07 ENCOUNTER — Inpatient Hospital Stay: Payer: BC Managed Care – PPO | Attending: Oncology | Admitting: Nurse Practitioner

## 2017-09-07 ENCOUNTER — Encounter: Payer: Self-pay | Admitting: Nurse Practitioner

## 2017-09-07 ENCOUNTER — Inpatient Hospital Stay: Payer: BC Managed Care – PPO

## 2017-09-07 VITALS — BP 126/88 | HR 86 | Temp 97.8°F | Resp 18 | Ht 75.0 in | Wt 249.6 lb

## 2017-09-07 DIAGNOSIS — C8518 Unspecified B-cell lymphoma, lymph nodes of multiple sites: Secondary | ICD-10-CM | POA: Diagnosis not present

## 2017-09-07 DIAGNOSIS — C8330 Diffuse large B-cell lymphoma, unspecified site: Secondary | ICD-10-CM

## 2017-09-07 DIAGNOSIS — K5903 Drug induced constipation: Secondary | ICD-10-CM

## 2017-09-07 DIAGNOSIS — Z87891 Personal history of nicotine dependence: Secondary | ICD-10-CM | POA: Diagnosis not present

## 2017-09-07 DIAGNOSIS — M25511 Pain in right shoulder: Secondary | ICD-10-CM

## 2017-09-07 DIAGNOSIS — M545 Low back pain: Secondary | ICD-10-CM | POA: Diagnosis not present

## 2017-09-07 DIAGNOSIS — Z85828 Personal history of other malignant neoplasm of skin: Secondary | ICD-10-CM | POA: Diagnosis not present

## 2017-09-07 DIAGNOSIS — G8929 Other chronic pain: Secondary | ICD-10-CM

## 2017-09-07 DIAGNOSIS — J449 Chronic obstructive pulmonary disease, unspecified: Secondary | ICD-10-CM | POA: Diagnosis not present

## 2017-09-07 MED ORDER — HEPARIN SOD (PORK) LOCK FLUSH 100 UNIT/ML IV SOLN
500.0000 [IU] | Freq: Once | INTRAVENOUS | Status: AC | PRN
  Administered 2017-09-07: 500 [IU] via INTRAVENOUS
  Filled 2017-09-07: qty 5

## 2017-09-07 MED ORDER — SODIUM CHLORIDE 0.9% FLUSH
10.0000 mL | INTRAVENOUS | Status: DC | PRN
  Administered 2017-09-07: 10 mL via INTRAVENOUS
  Filled 2017-09-07: qty 10

## 2017-09-07 NOTE — Progress Notes (Addendum)
[facility] OFFICE PROGRESS NOTE   Diagnosis: Non-Hodgkin's lymphoma  INTERVAL HISTORY:   Jeffery Wood returns as scheduled.  He overall feels well.  Energy level is slowly improving.  He has a good appetite.  No fever.  He has occasional mild night sweats.  He continues to have intermittent right shoulder pain.  Stable chronic back pain.  He denies shortness of breath.  No enlarged lymph nodes.  Objective:  Vital signs in last 24 hours:  Blood pressure 126/88, pulse 86, temperature 97.8 F (36.6 C), temperature source Oral, resp. rate 18, height _0  (1.905 m), weight 249 lb 9.6 oz (113.2 kg), SpO2 98 %.    HEENT: Neck without mass. Lymphatics: No palpable cervical, supraclavicular, axillary or inguinal lymph nodes. Resp: Lungs clear bilaterally. Cardio: Regular rate and rhythm. GI: Abdomen soft and nontender.  No hepatosplenomegaly. Vascular: No leg edema. Port-A-Cath without erythema.  Lab Results:  Lab Results  Component Value Date   WBC 7.2 06/19/2017   HGB 13.1 06/19/2017   HCT 39.6 06/19/2017   MCV 92.5 06/19/2017   PLT 276 06/19/2017   NEUTROABS 5.0 06/19/2017    Imaging:  No results found.  Medications: I have reviewed the patient's current medications.  Assessment/Plan: 1. High-grade B-cell non-Hodgkin's lymphoma, molecular studies pending, clinical stage IV, IPI-low intermediate risk, FISH panel revealed an 8q24/14q62fsion (MYC/IgH), negative for BCL 6 and BCL-2  Chest x-ray 01/15/2017-medial right upper lobe/perihilar mass.   Chest CT 01/19/2017-mass constricting the superior vena cava, subcarinal adenopathy, right suprahilar/mediastinal mass.   12/26/2018status postbronchoscopy with transbronchial biopsy of level 7node and biopsy of aright upper lobe lung mass by Dr. GServando Snare There was compression of the right mainstem bronchus. A mass was noted in the right upper lobe. Mediastinal lymph nodes were seen on EBUS. Multiple  biopsies of a level 7 node were obtained. Brushings and a biopsy of the right upper lobe mass were obtained. 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 Final pathology was nondiagnostic.   PET scan on 02/02/2017 showed hypermetabolism corresponding to the right sided mediastinal mass,progressive since the CT 01/19/2017. Metastatic disease/lymphoma within the bones, pericardium, stomach, bowel and abdominopelvic nodal stations.   Brain MRI 02/03/2017 showed no evidence of intracranial metastases or acute abnormality.   1/7/2019status postmediastinoscopy.  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 Molecular studies are pending.  Rituximab 02/08/2017  Cycle 1 CHOP 02/09/2017  Cycle 1 R-EPOCH 03/02/2017  Cycle2 R-EPOCH2/22/2019  Prophylactic intrathecal methotrexate 03/27/2017  Restaging PET scan 04/11/2017-significant decrease in FDG uptake associated with the chest, abdomen and pelvis and axial and proximal appendicular skeleton. No new foci of abnormal radiotracer uptake identified.  Cycle 3R-EPOCH3/15/2019with intrathecal methotrexate prophylaxis  Cycle 4R-EPOCH4/5/2019with intrathecal methotrexate prophylaxis  Cycle 5 R-EPOCH4/26/2019with intrathecal methotrexate prophylaxis  2. Chest/upper backand right armpain secondary to #1- resolved. 3. Exertional dyspnea secondary to #1 and COPD.Improved. 4. Asthma 5. Diabetes mellitus 6. Gout 7. Remote history of "carcinoid syndrome" 8. Remote history of tobacco use 9. Basal cell carcinoma removed from the right face 10. Chronic low back pain status post epidural steroid injections-followed at a pain management clinic 11. 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 Constipation secondary to narcotic analgesics and potentially lymphoma involving the GI tract-improved. 13. Right first toenail infection 06/19/2017- prescribed doxycycline    Disposition: Mr. WGatliffremains  in clinical remission from non-Hodgkin's lymphoma.  We are referring him to Dr. MHassell Donefor Port-A-Cath removal.  We also made a referral for him to  establish with a primary care provider.  He continues follow-up with the pain clinic for management of chronic back pain.  He will return for a follow-up visit here in 3 months.  He will contact the office in the interim with any problems.  Patient seen with Dr. Benay Spice.    Ned Card ANP/GNP-BC   09/07/2017  9:05 AM This was a shared visit with Ned Card.  Jeffery Wood is in remission from NHL.   Julieanne Manson, MD

## 2017-09-07 NOTE — Telephone Encounter (Signed)
Appointment scheduled Calendar printed/ AVS declined per 8/8

## 2017-10-15 ENCOUNTER — Telehealth: Payer: Self-pay | Admitting: *Deleted

## 2017-10-15 NOTE — Telephone Encounter (Signed)
Voicemail received from [name] wife Jeffery Wood requesting return call to [phone] in reference to referral from [facility] to [facility] now requiring chemotherapy surgical clearance.  Message forwarded to collaborative for further assistance and communication.

## 2017-10-29 ENCOUNTER — Telehealth: Payer: Self-pay | Admitting: *Deleted

## 2017-10-29 NOTE — Telephone Encounter (Signed)
Preoperative clearance requested from [facility] faxed returned. Patient is cleared for lumbar fusion with the medical clearance by primary MD per Dr. Benay Spice.

## 2017-10-31 ENCOUNTER — Other Ambulatory Visit: Payer: Self-pay | Admitting: Neurosurgery

## 2017-11-02 ENCOUNTER — Other Ambulatory Visit: Payer: Self-pay

## 2017-11-02 ENCOUNTER — Encounter (HOSPITAL_BASED_OUTPATIENT_CLINIC_OR_DEPARTMENT_OTHER): Payer: Self-pay | Admitting: *Deleted

## 2017-11-06 ENCOUNTER — Encounter (HOSPITAL_BASED_OUTPATIENT_CLINIC_OR_DEPARTMENT_OTHER)
Admission: RE | Admit: 2017-11-06 | Discharge: 2017-11-06 | Disposition: A | Discharge status: Home / Self Care | Payer: BC Managed Care – PPO | Source: Ambulatory Visit | Attending: Surgery | Admitting: Surgery

## 2017-11-06 DIAGNOSIS — Z01812 Encounter for preprocedural laboratory examination: Secondary | ICD-10-CM | POA: Insufficient documentation

## 2017-11-06 LAB — BASIC METABOLIC PANEL
ANION GAP: 10 (ref 5–15)
BUN: 11 mg/dL (ref 8–23)
CALCIUM: 9.7 mg/dL (ref 8.9–10.3)
CO2: 28 mmol/L (ref 22–32)
CREATININE: 0.8 (ref <=1.3)
Chloride: 102 mmol/L (ref 98–111)
Creatinine, Ser: 0.75 mg/dL (ref 0.61–1.24)
GLUCOSE: 118 mg/dL — AB (ref 70–99)
Potassium: 5 mmol/L (ref 3.5–5.1)
SODIUM: 140 mmol/L (ref 135–145)

## 2017-11-08 NOTE — H&P (Signed)
Chief Complaint:  Removal of portacath after treatment for lymphoma  History of Present Illness:  Jeffery Wood is an 62 y.o. male who has a recent diagnosis of new onset lymphoma for which he received a portacath back in January and now is ready for removal.       Past Medical History:  Diagnosis Date  . Arthritis   . Asthma   . Basal cell carcinoma (BCC) of right temple region   . Cancer ([place])    basal cell of right temple; carcinoids  . Complication of anesthesia    hard to wake up after bronchoscopy  . COPD (chronic obstructive pulmonary disease) ([place])   . Diabetes ([place])   . Dyspnea   . GERD (gastroesophageal reflux disease)   . Gout   . Headache   . History of hiatal hernia 05/16/2006   small noted on EGD  . Hypertension   . Lung mass   . 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  . Sleep apnea    uses mouth piece         Past Surgical History:  Procedure Laterality Date  . BICEPS TENDON REPAIR Left   . CARPAL TUNNEL RELEASE    . COLONOSCOPY    . KNEE ARTHROSCOPY    . LEFT HEART CATHETERIZATION WITH CORONARY ANGIOGRAM N/A 12/07/2010   Procedure: LEFT HEART CATHETERIZATION WITH CORONARY ANGIOGRAM;  Surgeon: Leonie Man, MD;  Location: [facility] CATH LAB;  Service: Cardiovascular;  Laterality: N/A;  . PILONIDAL CYST EXCISION    . UPPER GASTROINTESTINAL ENDOSCOPY  05/16/2006  . VIDEO BRONCHOSCOPY WITH ENDOBRONCHIAL ULTRASOUND N/A 01/24/2017   Procedure: VIDEO BRONCHOSCOPY WITH ENDOBRONCHIAL ULTRASOUND with TRANSBRONCHIAL BIOPSY;  Surgeon: Grace Isaac, MD;  Location: [place];  Service: Thoracic;  Laterality: N/A;  . VIDEO MEDIASTINOSCOPY N/A 02/05/2017   Procedure: VIDEO MEDIASTINOSCOPY;  Surgeon: Grace Isaac, MD;  Location: [place];  Service: Thoracic;  Laterality: N/A;             Current Facility-Administered Medications  Medication Dose Route Frequency Provider Last Rate Last Dose  . ceFAZolin (ANCEF) IVPB 2g/100 mL premix  2  g Intravenous On Call to OR [name], MD      . Chlorhexidine Gluconate Cloth 2 % PADS 6 each  6 each Topical Once [name], MD       And  . Chlorhexidine Gluconate Cloth 2 % PADS 6 each  6 each Topical Once [name], MD      . heparin 6,000 Units in sodium chloride 0.9 % 500 mL irrigation   Irrigation Once [name], MD      . lactated ringers infusion   Intravenous Continuous [name], MD 50 mL/hr at 02/27/17 9518     Patient has no known allergies.      Family History  Problem Relation Age of Onset  . Emphysema Mother   . Asthma Mother   . Heart disease Father    Social History:   reports that he quit smoking about 29 years ago. His smoking use included cigarettes. He has a 60.00 pack-year smoking history. he has never used smokeless tobacco. He reports that he does not drink alcohol or use drugs.   REVIEW OF SYSTEMS : Negative except for see problem list  Physical Exam:   Blood pressure 112/62, pulse (!) 103, temperature 98.5 F (36.9 C), temperature source Oral, resp. rate 18, height 6\' 3"  (1.905 m), weight 109.8 kg (242 lb), SpO2  96 %. Body mass index is 30.25 kg/m.  Gen:  WDWN WM NAD  Neurological: Alert and oriented to person, place, and time. Motor and sensory function is grossly intact  Head: Normocephalic and atraumatic.  Eyes: Conjunctivae are normal. Pupils are equal, round, and reactive to light. No scleral icterus.  Neck: Normal range of motion. Neck supple. No tracheal deviation or thyromegaly present.  Cardiovascular:  SR without murmurs or gallops.  No carotid bruits Respiratory: Effort normal but compromised from baseline.  Abdomen:  nontender Musculoskeletal: Normal range of motion. Extremities are nontender. No cyanosis, edema or clubbing noted Lymphadenopathy: No cervical, preauricular, postauricular or axillary adenopathy is present Skin: Skin is warm and dry. No rash noted. No diaphoresis. No erythema. No  pallor. Pscyh: Normal mood and affect. Behavior is normal. Judgment and thought content normal.   LABORATORY RESULTS: LabResultsLast48Hours  Results for orders placed or performed during the hospital encounter of 02/27/17 (from the past 48 hour(s))  Glucose, capillary     Status: Abnormal   Collection Time: 02/27/17  8:00 AM  Result Value Ref Range   Glucose-Capillary 266 (H) 65 - 99 mg/dL   Comment 1 Notify RN        RADIOLOGY RESULTS: ImagingResults(Last48hours)  No results found.    Problem List:     Patient Active Problem List   Diagnosis Date Noted  . 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 NHL (non-Hodgkin's lymphoma) ([place]) 02/07/2017  . OSA (obstructive sleep apnea) 07/22/2013  . Chest pain, non-cardiac 12/07/2010  . Diabetes mellitus 12/07/2010    Assessment & Plan: Ready for portacath removal.      Matt B. Hassell Done, MD, [facility] [phone] beeper [phone]

## 2017-11-09 ENCOUNTER — Ambulatory Visit (HOSPITAL_BASED_OUTPATIENT_CLINIC_OR_DEPARTMENT_OTHER): Payer: BC Managed Care – PPO | Admitting: Anesthesiology

## 2017-11-09 ENCOUNTER — Encounter (HOSPITAL_BASED_OUTPATIENT_CLINIC_OR_DEPARTMENT_OTHER): Payer: Self-pay

## 2017-11-09 ENCOUNTER — Ambulatory Visit (HOSPITAL_BASED_OUTPATIENT_CLINIC_OR_DEPARTMENT_OTHER)
Admission: RE | Admit: 2017-11-09 | Discharge: 2017-11-09 | Disposition: A | Discharge status: Home / Self Care | Payer: BC Managed Care – PPO | Source: Ambulatory Visit | Attending: Surgery | Admitting: Surgery

## 2017-11-09 ENCOUNTER — Other Ambulatory Visit: Payer: Self-pay

## 2017-11-09 ENCOUNTER — Encounter (HOSPITAL_BASED_OUTPATIENT_CLINIC_OR_DEPARTMENT_OTHER)
Admission: RE | Disposition: A | Discharge status: Home / Self Care | Payer: Self-pay | Source: Ambulatory Visit | Attending: Surgery

## 2017-11-09 DIAGNOSIS — E119 Type 2 diabetes mellitus without complications: Secondary | ICD-10-CM | POA: Diagnosis not present

## 2017-11-09 DIAGNOSIS — K219 Gastro-esophageal reflux disease without esophagitis: Secondary | ICD-10-CM | POA: Insufficient documentation

## 2017-11-09 DIAGNOSIS — Z79899 Other long term (current) drug therapy: Secondary | ICD-10-CM | POA: Diagnosis not present

## 2017-11-09 DIAGNOSIS — Z87891 Personal history of nicotine dependence: Secondary | ICD-10-CM | POA: Insufficient documentation

## 2017-11-09 DIAGNOSIS — Z452 Encounter for adjustment and management of vascular access device: Secondary | ICD-10-CM | POA: Diagnosis not present

## 2017-11-09 DIAGNOSIS — M109 Gout, unspecified: Secondary | ICD-10-CM | POA: Diagnosis not present

## 2017-11-09 DIAGNOSIS — Z85828 Personal history of other malignant neoplasm of skin: Secondary | ICD-10-CM | POA: Diagnosis not present

## 2017-11-09 DIAGNOSIS — J449 Chronic obstructive pulmonary disease, unspecified: Secondary | ICD-10-CM | POA: Diagnosis not present

## 2017-11-09 DIAGNOSIS — M1990 Unspecified osteoarthritis, unspecified site: Secondary | ICD-10-CM | POA: Diagnosis not present

## 2017-11-09 DIAGNOSIS — Z7982 Long term (current) use of aspirin: Secondary | ICD-10-CM | POA: Insufficient documentation

## 2017-11-09 DIAGNOSIS — Z7984 Long term (current) use of oral hypoglycemic drugs: Secondary | ICD-10-CM | POA: Insufficient documentation

## 2017-11-09 DIAGNOSIS — I10 Essential (primary) hypertension: Secondary | ICD-10-CM | POA: Diagnosis not present

## 2017-11-09 DIAGNOSIS — G4733 Obstructive sleep apnea (adult) (pediatric): Secondary | ICD-10-CM | POA: Insufficient documentation

## 2017-11-09 HISTORY — PX: PORT-A-CATH REMOVAL: SHX5289

## 2017-11-09 LAB — LIPID PANEL
CHOLESTEROL: 172 (ref 0–200)
HDL: 44 (ref 35–70)
LDL Cholesterol: 109
TRIGLYCERIDES: 91 (ref 40–160)

## 2017-11-09 LAB — BASIC METABOLIC PANEL: BUN: 12 (ref 4–21)

## 2017-11-09 LAB — GLUCOSE, CAPILLARY
Glucose-Capillary: 82 mg/dL (ref 70–99)
Glucose-Capillary: 83 mg/dL (ref 70–99)

## 2017-11-09 LAB — PSA: PSA: 0.4

## 2017-11-09 LAB — HEMOGLOBIN A1C: Hgb A1c MFr Bld: 4.9 (ref 4.0–6.0)

## 2017-11-09 SURGERY — REMOVAL PORT-A-CATH
Anesthesia: Monitor Anesthesia Care | Site: Chest

## 2017-11-09 MED ORDER — LACTATED RINGERS IV SOLN: INTRAVENOUS | Status: DC

## 2017-11-09 MED ORDER — BUPIVACAINE-EPINEPHRINE (PF) 0.25% -1:200000 IJ SOLN
INTRAMUSCULAR | Status: AC
  Filled 2017-11-09: qty 30

## 2017-11-09 MED ORDER — FENTANYL CITRATE (PF) 100 MCG/2ML IJ SOLN: 50.0000 ug | INTRAMUSCULAR | Status: DC | PRN

## 2017-11-09 MED ORDER — DEXAMETHASONE SODIUM PHOSPHATE 10 MG/ML IJ SOLN
INTRAMUSCULAR | Status: AC
  Filled 2017-11-09: qty 1

## 2017-11-09 MED ORDER — CHLORHEXIDINE GLUCONATE CLOTH 2 % EX PADS: 6.0000 | MEDICATED_PAD | Freq: Once | CUTANEOUS | Status: DC

## 2017-11-09 MED ORDER — CEFAZOLIN SODIUM-DEXTROSE 2-4 GM/100ML-% IV SOLN
INTRAVENOUS | Status: AC
  Filled 2017-11-09: qty 100

## 2017-11-09 MED ORDER — FENTANYL CITRATE (PF) 100 MCG/2ML IJ SOLN
INTRAMUSCULAR | Status: AC
  Filled 2017-11-09: qty 2

## 2017-11-09 MED ORDER — SCOPOLAMINE 1 MG/3DAYS TD PT72: 1.0000 | MEDICATED_PATCH | Freq: Once | TRANSDERMAL | Status: DC | PRN

## 2017-11-09 MED ORDER — CEFAZOLIN SODIUM-DEXTROSE 2-4 GM/100ML-% IV SOLN: 2.0000 g | INTRAVENOUS | Status: DC

## 2017-11-09 MED ORDER — LIDOCAINE 2% (20 MG/ML) 5 ML SYRINGE
INTRAMUSCULAR | Status: AC
  Filled 2017-11-09: qty 5

## 2017-11-09 MED ORDER — LIDOCAINE HCL (PF) 1 % IJ SOLN
INTRAMUSCULAR | Status: DC | PRN
  Administered 2017-11-09: 30 mL

## 2017-11-09 MED ORDER — PROPOFOL 500 MG/50ML IV EMUL
INTRAVENOUS | Status: AC
  Filled 2017-11-09: qty 50

## 2017-11-09 MED ORDER — ONDANSETRON HCL 4 MG/2ML IJ SOLN
INTRAMUSCULAR | Status: AC
  Filled 2017-11-09: qty 2

## 2017-11-09 MED ORDER — MIDAZOLAM HCL 2 MG/2ML IJ SOLN: 1.0000 mg | INTRAMUSCULAR | Status: DC | PRN

## 2017-11-09 MED ORDER — MIDAZOLAM HCL 2 MG/2ML IJ SOLN
INTRAMUSCULAR | Status: AC
  Filled 2017-11-09: qty 2

## 2017-11-09 MED ORDER — BUPIVACAINE HCL (PF) 0.5 % IJ SOLN
INTRAMUSCULAR | Status: DC | PRN
  Administered 2017-11-09: 30 mL

## 2017-11-09 SURGICAL SUPPLY — 42 items
ADH SKN CLS APL DERMABOND .7 (GAUZE/BANDAGES/DRESSINGS)
APL SKNCLS STERI-STRIP NONHPOA (GAUZE/BANDAGES/DRESSINGS)
BENZOIN TINCTURE PRP APPL 2/3 (GAUZE/BANDAGES/DRESSINGS) IMPLANT
BLADE SURG 15 STRL LF DISP TIS (BLADE) ×1 IMPLANT
BLADE SURG 15 STRL SS (BLADE) ×2
CANISTER SUCT 1200ML W/VALVE (MISCELLANEOUS) IMPLANT
CLEANER CAUTERY TIP 5X5 PAD (MISCELLANEOUS) IMPLANT
COVER BACK TABLE 60X90IN (DRAPES) ×2 IMPLANT
COVER MAYO STAND STRL (DRAPES) ×2 IMPLANT
COVER WAND RF STERILE (DRAPES) IMPLANT
DECANTER SPIKE VIAL GLASS SM (MISCELLANEOUS) ×2 IMPLANT
DERMABOND ADVANCED (GAUZE/BANDAGES/DRESSINGS)
DERMABOND ADVANCED .7 DNX12 (GAUZE/BANDAGES/DRESSINGS) IMPLANT
DRAPE LAPAROTOMY 100X72 PEDS (DRAPES) ×2 IMPLANT
ELECT REM PT RETURN 9FT ADLT (ELECTROSURGICAL) ×2
ELECTRODE REM PT RTRN 9FT ADLT (ELECTROSURGICAL) ×1 IMPLANT
GAUZE SPONGE 4X4 12PLY STRL LF (GAUZE/BANDAGES/DRESSINGS) ×2 IMPLANT
GLOVE BIO SURGEON STRL SZ8 (GLOVE) ×2 IMPLANT
GLOVE BIOGEL PI IND STRL 8 (GLOVE) ×1 IMPLANT
GLOVE BIOGEL PI INDICATOR 8 (GLOVE) ×1
GLOVE SURG SYN 8.0 (GLOVE) ×2 IMPLANT
GLOVE SURG SYN 8.0 PF PI (GLOVE) IMPLANT
GOWN STRL REIN XL XLG (GOWN DISPOSABLE) ×1 IMPLANT
GOWN STRL REUS W/ TWL LRG LVL3 (GOWN DISPOSABLE) IMPLANT
GOWN STRL REUS W/ TWL XL LVL3 (GOWN DISPOSABLE) ×1 IMPLANT
GOWN STRL REUS W/TWL LRG LVL3 (GOWN DISPOSABLE)
GOWN STRL REUS W/TWL XL LVL3 (GOWN DISPOSABLE) ×2
NEEDLE HYPO 25X1 1.5 SAFETY (NEEDLE) ×2 IMPLANT
PACK BASIN DAY SURGERY FS (CUSTOM PROCEDURE TRAY) ×2 IMPLANT
PAD CLEANER CAUTERY TIP 5X5 (MISCELLANEOUS)
PENCIL BUTTON HOLSTER BLD 10FT (ELECTRODE) ×2 IMPLANT
SLEEVE SCD COMPRESS KNEE MED (MISCELLANEOUS) IMPLANT
STRIP CLOSURE SKIN 1/2X4 (GAUZE/BANDAGES/DRESSINGS) IMPLANT
SUT VIC AB 4-0 SH 18 (SUTURE) ×2 IMPLANT
SUT VIC AB 5-0 P-3 18X BRD (SUTURE) IMPLANT
SUT VIC AB 5-0 P3 18 (SUTURE)
SYR BULB 3OZ (MISCELLANEOUS) ×2 IMPLANT
SYR CONTROL 10ML LL (SYRINGE) ×2 IMPLANT
TOWEL GREEN STERILE FF (TOWEL DISPOSABLE) ×2 IMPLANT
TRAY DSU PREP LF (CUSTOM PROCEDURE TRAY) ×2 IMPLANT
TUBE CONNECTING 20X1/4 (TUBING) IMPLANT
YANKAUER SUCT BULB TIP NO VENT (SUCTIONS) IMPLANT

## 2017-11-09 NOTE — Discharge Instructions (Signed)
Ice pack if needed for pain.  Keep clean and dry today, may shower tomorrow. OK to put a bandage over incision if you plan to be active.  Leave skin glue, it will wear off by itself.  Call if any problems. [phone]

## 2017-11-09 NOTE — Anesthesia Postprocedure Evaluation (Signed)
Anesthesia Post Note  Patient: Jeffery Wood  Procedure(s) Performed: REMOVAL PORT-A-CATH (N/A Chest)     Patient location during evaluation: PACU Anesthesia Type: MAC Level of consciousness: awake and alert Pain management: pain level controlled Vital Signs Assessment: post-procedure vital signs reviewed and stable Respiratory status: spontaneous breathing, nonlabored ventilation and respiratory function stable Cardiovascular status: stable and blood pressure returned to baseline Postop Assessment: no apparent nausea or vomiting Anesthetic complications: no    Last Vitals:  Vitals:   11/09/17 0738 11/09/17 1040  BP: 127/75 131/80  Pulse: 85 87  Resp: 16 18  Temp: 36.6 C 37.1 C  SpO2: 99% 98%    Last Pain:  Vitals:   11/09/17 0738  TempSrc: Oral  PainSc: 0-No pain                 Lyle Niblett,W. EDMOND

## 2017-11-09 NOTE — Transfer of Care (Signed)
Immediate Anesthesia Transfer of Care Note  Patient: Jeffery Wood  Procedure(s) Performed: REMOVAL PORT-A-CATH (N/A Chest)  Patient Location: PACU  Anesthesia Type:MAC  Level of Consciousness: awake, alert  and oriented  Airway & Oxygen Therapy: Patient Spontanous Breathing  Post-op Assessment: Report given to RN and Post -op Vital signs reviewed and stable  Post vital signs: Reviewed and stable  Last Vitals:  Vitals Value Taken Time  BP    Temp    Pulse    Resp    SpO2      Last Pain:  Vitals:   11/09/17 0738  TempSrc: Oral  PainSc: 0-No pain         Complications: No apparent anesthesia complications

## 2017-11-09 NOTE — Interval H&P Note (Signed)
History and Physical Interval Note:  11/09/2017 8:36 AM  Jeffery Wood  has presented today for surgery, with the diagnosis of non hodgkin's lymphoma  The various methods of treatment have been discussed with the patient and family. After consideration of risks, benefits and other options for treatment, the patient has consented to  Procedure(s) with comments: REMOVAL PORT-A-CATH (N/A) - local as a surgical intervention .  The patient's history has been reviewed, patient examined, no change in status, stable for surgery.  I have reviewed the patient's chart and labs.  Questions were answered to the patient's satisfaction.     Pedro Earls

## 2017-11-09 NOTE — Anesthesia Preprocedure Evaluation (Signed)
Anesthesia Evaluation  Patient identified by MRN, date of birth, ID band Patient awake    Reviewed: Allergy & Precautions, H&P , NPO status , Patient's Chart, lab work & pertinent test results  Airway Mallampati: II  TM Distance: >3 FB Neck ROM: Full    Dental no notable dental hx. (+) Teeth Intact, Dental Advisory Given   Pulmonary asthma , sleep apnea , COPD,  COPD inhaler, former smoker,    Pulmonary exam normal breath sounds clear to auscultation       Cardiovascular hypertension, Pt. on medications  Rhythm:Regular Rate:Normal     Neuro/Psych  Headaches, negative psych ROS   GI/Hepatic negative GI ROS, Neg liver ROS, hiatal hernia, GERD  Medicated and Controlled,  Endo/Other  diabetes, Type 2, Oral Hypoglycemic Agents  Renal/GU negative Renal ROS  negative genitourinary   Musculoskeletal  (+) Arthritis , Osteoarthritis,    Abdominal   Peds  Hematology negative hematology ROS (+)   Anesthesia Other Findings   Reproductive/Obstetrics negative OB ROS                             Anesthesia Physical Anesthesia Plan  ASA: III  Anesthesia Plan: MAC   Post-op Pain Management:    Induction:   PONV Risk Score and Plan: 1 and Treatment may vary due to age or medical condition  Airway Management Planned: Nasal Cannula  Additional Equipment:   Intra-op Plan:   Post-operative Plan:   Informed Consent: I have reviewed the patients History and Physical, chart, labs and discussed the procedure including the risks, benefits and alternatives for the proposed anesthesia with the patient or authorized representative who has indicated his/her understanding and acceptance.   Dental advisory given  Plan Discussed with: CRNA  Anesthesia Plan Comments:         Anesthesia Quick Evaluation

## 2017-11-09 NOTE — Op Note (Signed)
[name]  1955/08/02 @DATE @    PCP:  Levin Erp, MD   Surgeon: Kaylyn Lim, MD, FACS  Asst:  none  Anes:  Local 1% marcaine and lidocaine  Preop Dx: portacath in left subclavian Postop Dx: Explanted portacath  Procedure: Explantation of portacath Location Surgery: CDS 2 Complications: none  EBL:   minimal cc  Drains: none  Description of Procedure:  The patient was taken to OR 2 .  After no anesthesia was administered and the patient was prepped  with [employer] and a timeout was performed.  Local injection with mixture of lidocaine and marcaine.  Incision and explantation of portacath in toto without difficulty.  Closed with 4-0 vicryl and Dermabond  The patient tolerated the procedure well and was taken to the PACU in stable condition.     Matt B. Hassell Done, [place], [facility], [place]

## 2017-11-13 ENCOUNTER — Encounter (HOSPITAL_BASED_OUTPATIENT_CLINIC_OR_DEPARTMENT_OTHER): Payer: Self-pay | Admitting: Surgery

## 2017-11-23 ENCOUNTER — Telehealth: Payer: Self-pay | Admitting: *Deleted

## 2017-11-23 NOTE — Telephone Encounter (Signed)
Telephone call received from patient. The intermettint right shoulder pain has not gotten any better. Patient is very concerned this is a sign his cancer has returned. Patient understands a message will be sent to Dr. Benay Spice but he needs to follow up with his PCP for this increased pain. Patient verbalized an understanding.

## 2017-11-29 ENCOUNTER — Ambulatory Visit (INDEPENDENT_AMBULATORY_CARE_PROVIDER_SITE_OTHER): Payer: BC Managed Care – PPO | Admitting: Family Medicine

## 2017-11-29 ENCOUNTER — Encounter: Payer: Self-pay | Admitting: Family Medicine

## 2017-11-29 VITALS — BP 128/74 | HR 83 | Temp 98.0°F | Ht 75.0 in | Wt 254.6 lb

## 2017-11-29 DIAGNOSIS — M545 Low back pain, unspecified: Secondary | ICD-10-CM

## 2017-11-29 DIAGNOSIS — C8330 Diffuse large B-cell lymphoma, unspecified site: Secondary | ICD-10-CM

## 2017-11-29 DIAGNOSIS — R739 Hyperglycemia, unspecified: Secondary | ICD-10-CM

## 2017-11-29 DIAGNOSIS — E1159 Type 2 diabetes mellitus with other circulatory complications: Secondary | ICD-10-CM | POA: Insufficient documentation

## 2017-11-29 DIAGNOSIS — I10 Essential (primary) hypertension: Secondary | ICD-10-CM | POA: Insufficient documentation

## 2017-11-29 DIAGNOSIS — K219 Gastro-esophageal reflux disease without esophagitis: Secondary | ICD-10-CM | POA: Insufficient documentation

## 2017-11-29 DIAGNOSIS — J4530 Mild persistent asthma, uncomplicated: Secondary | ICD-10-CM | POA: Diagnosis not present

## 2017-11-29 DIAGNOSIS — L409 Psoriasis, unspecified: Secondary | ICD-10-CM | POA: Insufficient documentation

## 2017-11-29 DIAGNOSIS — M1990 Unspecified osteoarthritis, unspecified site: Secondary | ICD-10-CM | POA: Insufficient documentation

## 2017-11-29 DIAGNOSIS — N400 Enlarged prostate without lower urinary tract symptoms: Secondary | ICD-10-CM | POA: Insufficient documentation

## 2017-11-29 DIAGNOSIS — J45909 Unspecified asthma, uncomplicated: Secondary | ICD-10-CM | POA: Insufficient documentation

## 2017-11-29 DIAGNOSIS — G8929 Other chronic pain: Secondary | ICD-10-CM | POA: Insufficient documentation

## 2017-11-29 NOTE — Patient Instructions (Signed)
It was very nice to see you today!  You can cut the amaryl to just one dose per day.  I think it would be a good idea if we were able to stop this medication completely in the near future.  You can also stop Advair.  Please continue with Spiriva.  No other changes today.  We will get your records from your previous doctor.  Come back to see me in 6 months, or sooner as needed.  Take care, Dr Jerline Pain

## 2017-11-29 NOTE — Assessment & Plan Note (Signed)
Follows with urology for this.  Continue Flomax 0.4 mg daily.

## 2017-11-29 NOTE — Assessment & Plan Note (Signed)
It is unclear patient actually ever had true diabetes, or just prediabetes.  Reportedly never had an A1c greater than 6.0.  His last A1c was 4.9 a couple of weeks ago.  He has had a few hypoglycemic episodes as well.  We will decrease Amaryl to 2 mg once daily.  Ideally, would like to stop this medication completely.  Continue metformin 1000 mg twice daily.  Follow-up with me in 6 months.

## 2017-11-29 NOTE — Assessment & Plan Note (Signed)
Stable.  Continue omeprazole 20 mg twice daily.

## 2017-11-29 NOTE — Assessment & Plan Note (Signed)
Stable.  Continue tramadol and Celebrex.  Defer further management to rheumatology pain management.

## 2017-11-29 NOTE — Assessment & Plan Note (Signed)
No red flag signs or symptoms.  Defer further management to rheumatology, neurosurgery, impingement.

## 2017-11-29 NOTE — Assessment & Plan Note (Signed)
Stable.  Continue topical betamethasone.

## 2017-11-29 NOTE — Progress Notes (Signed)
Subjective:  Jeffery Wood is a 62 y.o. male who presents today with a chief complaint of Hyperglycemia and to establish care.  HPI:  Hyperglycemia Patient was diagnosed as "diabetic" a few years ago.  He was initially not started on any medications and instead managed his sugars through lifestyle alone.  Over the last couple years he has been started on a few medications.  He is currently on metformin 1000 mg twice daily, and Amaryl 2 mg twice daily.  Had A1c checked a couple weeks ago and it was 4.9.  Patient reports that his A1c is always been less than 6.  Non-Hodgkin's lymphoma Diagnosed last year.  Has underwent chemotherapy.  At last point with oncology almost 3 months ago.  Per that note he was in clinical remission.  We will follow-up with him again in a few weeks.  Hypertension Several year history.  Has been on losartan 100 mg daily for the past couple of years.  He is tolerating this well without side effects.  Asthma Several year history.  Currently on Spiriva, Advair, and Xopenex as needed.  He would like to stop Advair as he will sometimes forget to take this for several days and not notice any change in his pulmonary symptoms.  BPH Has seen urology once for this.  Recently started Flomax 0.4 mg daily and is tolerating this well.  Chronic low back pain/arthritis Currently sees rheumatology, neurosurgery, and pain management for this.  He takes tramadol as needed.  He is planning to have surgery in a few weeks.  Symptoms are stable.  Psoriasis Several year history.  Uses betamethasone ointment as needed.  Symptoms are well controlled.  GERD Several year history.  On Prilosec 20 mg twice daily and tolerating well.  ROS: Per HPI, otherwise a complete review of systems was negative.   PMH:  The following were reviewed and entered/updated in epic: Past Medical History:  Diagnosis Date  . Arthritis   . Asthma   . Basal cell carcinoma (BCC) of right temple region     . Cancer ([place])    basal cell of right temple; carcinoids  . Complication of anesthesia    hard to wake up after bronchoscopy  . COPD (chronic obstructive pulmonary disease) ([place])   . Diabetes ([place])   . Dyspnea   . GERD (gastroesophageal reflux disease)   . Gout   . Headache   . History of hiatal hernia 05/16/2006   small noted on EGD  . Hypertension   . Lung mass   . 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  . Sleep apnea    uses mouth piece   Patient Active Problem List   Diagnosis Date Noted  . Hypertension 11/29/2017  . Asthma 11/29/2017  . BPH (benign prostatic hyperplasia) 11/29/2017  . Chronic low back pain 11/29/2017  . Osteoarthritis 11/29/2017  . Psoriasis 11/29/2017  . GERD (gastroesophageal reflux disease) 11/29/2017  . Neuropathic pain 04/14/2017  . Constipation 03/04/2017  . Admission for antineoplastic chemotherapy   . Encounter for antineoplastic immunotherapy   . Lymphoma, high grade ([place]) 03/02/2017  . NHL (non-Hodgkin's lymphoma) ([place]) 02/07/2017  . OSA (obstructive sleep apnea) 07/22/2013  . Chest pain, non-cardiac 12/07/2010  . Hyperglycemia 12/07/2010   Past Surgical History:  Procedure Laterality Date  . BICEPS TENDON REPAIR Left   . CARPAL TUNNEL RELEASE    . COLONOSCOPY    . FINGER SURGERY    . KNEE ARTHROSCOPY  ACL tear  . LEFT HEART CATHETERIZATION WITH CORONARY ANGIOGRAM N/A 12/07/2010   Procedure: LEFT HEART CATHETERIZATION WITH CORONARY ANGIOGRAM;  Surgeon: Leonie Man, MD;  Location: [facility] CATH LAB;  Service: Cardiovascular;  Laterality: N/A;  . PILONIDAL CYST EXCISION    . PORT-A-CATH REMOVAL N/A 11/09/2017   Procedure: REMOVAL PORT-A-CATH;  Surgeon: [name], MD;  Location: [place];  Service: General;  Laterality: N/A;  local  . PORTACATH PLACEMENT Left 02/27/2017   Procedure: INSERTION PORT-A-CATH;  Surgeon: [name], MD;  Location: [facility];  Service: General;  Laterality: Left;  . UPPER  GASTROINTESTINAL ENDOSCOPY  05/16/2006  . VIDEO BRONCHOSCOPY WITH ENDOBRONCHIAL ULTRASOUND N/A 01/24/2017   Procedure: VIDEO BRONCHOSCOPY WITH ENDOBRONCHIAL ULTRASOUND with TRANSBRONCHIAL BIOPSY;  Surgeon: Grace Isaac, MD;  Location: [place];  Service: Thoracic;  Laterality: N/A;  . VIDEO MEDIASTINOSCOPY N/A 02/05/2017   Procedure: VIDEO MEDIASTINOSCOPY;  Surgeon: Grace Isaac, MD;  Location: [facility] OR;  Service: Thoracic;  Laterality: N/A;    Family History  Problem Relation Age of Onset  . Emphysema Mother   . Asthma Mother   . Heart disease Father     Medications- reviewed and updated Current Outpatient Medications  Medication Sig Dispense Refill  . acetaminophen (TYLENOL) 650 MG CR tablet Take 1,300 mg by mouth 2 (two) times daily as needed for pain.     Marland Kitchen albuterol (PROVENTIL) (2.5 MG/3ML) 0.083% nebulizer solution Take 2.5 mg by nebulization at bedtime as needed for wheezing or shortness of breath.    Marland Kitchen aspirin EC 81 MG tablet Take 81 mg by mouth daily.      . betamethasone dipropionate (DIPROLENE) 0.05 % cream Apply 1 application topically daily as needed (for psoriasis).     . celecoxib (CELEBREX) 200 MG capsule Take 200 mg by mouth 2 (two) times daily.    . clonazePAM (KLONOPIN) 0.5 MG tablet Take 0.5 mg by mouth at bedtime.      Marland Kitchen etodolac (LODINE) 400 MG tablet Take 400 mg by mouth 3 (three) times daily as needed (gout pain).     . Fluticasone-Salmeterol (ADVAIR) 100-50 MCG/DOSE AEPB Inhale 1 puff into the lungs daily.      Marland Kitchen glimepiride (AMARYL) 2 MG tablet Take 2 mg by mouth 2 (two) times daily before a meal.     . levalbuterol (XOPENEX HFA) 45 MCG/ACT inhaler Inhale 2 puffs into the lungs daily as needed for wheezing or shortness of breath.     . loratadine (CLARITIN) 10 MG tablet Take 10 mg by mouth daily as needed for allergies.     Marland Kitchen losartan (COZAAR) 100 MG tablet Take 100 mg by mouth at bedtime.     . metFORMIN (GLUCOPHAGE) 1000 MG tablet Take 1,000 mg by mouth 2  (two) times daily with a meal.      . Multiple Vitamins-Minerals (MULTIVITAMINS THER. W/MINERALS) TABS Take 2 tablets by mouth daily.     Marland Kitchen omeprazole (PRILOSEC) 20 MG capsule Take 20 mg by mouth 2 (two) times daily.      . tamsulosin (FLOMAX) 0.4 MG CAPS capsule Take 0.4 mg by mouth daily.    Marland Kitchen tiotropium (SPIRIVA) 18 MCG inhalation capsule Place 18 mcg into inhaler and inhale daily.    . traMADol (ULTRAM) 50 MG tablet Take 50 mg by mouth at bedtime as needed for moderate pain.      No current facility-administered medications for this visit.     Allergies-reviewed and updated No Known Allergies  Social History   Socioeconomic History  . Marital status: Married    Spouse name: Not on file  . Number of children: Not on file  . Years of education: Not on file  . Highest education level: Not on file  Occupational History  . Occupation: retired  Scientific laboratory technician  . Financial resource strain: Not on file  . Food insecurity:    Worry: Not on file    Inability: Not on file  . Transportation needs:    Medical: Not on file    Non-medical: Not on file  Tobacco Use  . Smoking status: Former Smoker    Packs/day: 3.00    Years: 20.00    Pack years: 60.00    Types: Cigarettes    Last attempt to quit: 01/31/1988    Years since quitting: 29.8  . Smokeless tobacco: Never Used  Substance and Sexual Activity  . Alcohol use: No  . Drug use: No  . Sexual activity: Yes    Partners: Female    Birth control/protection: None  Lifestyle  . Physical activity:    Days per week: Not on file    Minutes per session: Not on file  . Stress: Not on file  Relationships  . Social connections:    Talks on phone: Not on file    Gets together: Not on file    Attends religious service: Not on file    Active member of club or organization: Not on file    Attends meetings of clubs or organizations: Not on file    Relationship status: Not on file  Other Topics Concern  . Not on file  Social History  Narrative  . Not on file    Objective:  Physical Exam: BP 128/74 (BP Location: Left Arm, Patient Position: Sitting, Cuff Size: Normal)   Pulse 83   Temp 98 F (36.7 C) (Oral)   Ht 6\' 3"  (1.905 m)   Wt 254 lb 9.6 oz (115.5 kg)   SpO2 (!) 8%   BMI 31.82 kg/m   Gen: NAD, resting comfortably CV: RRR with no murmurs appreciated Pulm: NWOB, CTAB with no crackles, wheezes, or rhonchi GI: Normal bowel sounds present. Soft, Nontender, Nondistended. MSK: No edema, cyanosis, or clubbing noted Skin: Warm, dry Neuro: Grossly normal, moves all extremities Psych: Normal affect and thought content  Assessment/Plan:  Hyperglycemia It is unclear patient actually ever had true diabetes, or just prediabetes.  Reportedly never had an A1c greater than 6.0.  His last A1c was 4.9 a couple of weeks ago.  He has had a few hypoglycemic episodes as well.  We will decrease Amaryl to 2 mg once daily.  Ideally, would like to stop this medication completely.  Continue metformin 1000 mg twice daily.  Follow-up with me in 6 months.  Hypertension At goal.  Continue losartan 100 mg daily.  Obtain records from previous PCP.  Asthma Continue Spiriva.  We will stop Advair as he does not think that he is benefiting from this.  Continue Xopenex as needed.  Follow-up in 6 months.  BPH (benign prostatic hyperplasia) Follows with urology for this.  Continue Flomax 0.4 mg daily.  Chronic low back pain No red flag signs or symptoms.  Defer further management to rheumatology, neurosurgery, impingement.  Osteoarthritis Stable.  Continue tramadol and Celebrex.  Defer further management to rheumatology pain management.  Psoriasis Stable.  Continue topical betamethasone.  GERD (gastroesophageal reflux disease) Stable.  Continue omeprazole 20 mg twice daily.  Preventive health care  Up-to-date on flu shot, vaccines, and other screening test.  Will obtain records from previous PCP.  Algis Greenhouse. Jerline Pain, MD 11/29/2017  11:03 AM

## 2017-11-29 NOTE — Assessment & Plan Note (Signed)
At goal.  Continue losartan 100 mg daily.  Obtain records from previous PCP.

## 2017-11-29 NOTE — Assessment & Plan Note (Signed)
Continue Spiriva.  We will stop Advair as he does not think that he is benefiting from this.  Continue Xopenex as needed.  Follow-up in 6 months.

## 2017-11-30 NOTE — Telephone Encounter (Signed)
Please check on him 12/03/2017, we will schedule an office visit if he continues to have pain

## 2017-12-03 ENCOUNTER — Telehealth: Payer: Self-pay | Admitting: *Deleted

## 2017-12-03 NOTE — Telephone Encounter (Signed)
Confirmed with patient that his right shoulder pain has not increased since mentioned in August. He feels he will be fine to wait till his already scheduled appointment on 12/10/17 with Dr. Benay Spice.

## 2017-12-10 ENCOUNTER — Telehealth: Payer: Self-pay | Admitting: Oncology

## 2017-12-10 ENCOUNTER — Inpatient Hospital Stay: Payer: BC Managed Care – PPO | Attending: Oncology | Admitting: Oncology

## 2017-12-10 VITALS — BP 128/79 | HR 87 | Temp 98.0°F | Resp 17 | Ht 75.0 in | Wt 250.7 lb

## 2017-12-10 DIAGNOSIS — C8518 Unspecified B-cell lymphoma, lymph nodes of multiple sites: Secondary | ICD-10-CM | POA: Diagnosis not present

## 2017-12-10 DIAGNOSIS — E119 Type 2 diabetes mellitus without complications: Secondary | ICD-10-CM | POA: Insufficient documentation

## 2017-12-10 DIAGNOSIS — C8330 Diffuse large B-cell lymphoma, unspecified site: Secondary | ICD-10-CM

## 2017-12-10 DIAGNOSIS — Z9221 Personal history of antineoplastic chemotherapy: Secondary | ICD-10-CM

## 2017-12-10 DIAGNOSIS — E340 Carcinoid syndrome: Secondary | ICD-10-CM | POA: Diagnosis not present

## 2017-12-10 DIAGNOSIS — Z87891 Personal history of nicotine dependence: Secondary | ICD-10-CM | POA: Insufficient documentation

## 2017-12-10 NOTE — Progress Notes (Signed)
Patient reports he has brought Advanced Directive in to be scanned twice.

## 2017-12-10 NOTE — Telephone Encounter (Signed)
Scheduled appt per 11/11 los - pt Is aware of appt date and time - no print out wanted - my chart active.

## 2017-12-10 NOTE — Progress Notes (Signed)
[place] OFFICE PROGRESS NOTE   Diagnosis: Non-Hodgkin's lymphoma  INTERVAL HISTORY:   Jeffery Wood returns as scheduled.  He has chronic low back pain.  He has transient relief with injection therapy.  He is scheduled for lower back surgery by Dr. Arnoldo Morale next month.  No fever.  Good appetite and energy level.  No dyspnea. He has intermittent discomfort at the right shoulder.  No consistent pain.  Objective:  Vital signs in last 24 hours:  Blood pressure 128/79, pulse 87, temperature 98 F (36.7 C), temperature source Oral, resp. rate 17, height 6' 3"  (1.905 m), weight 250 lb 11.2 oz (113.7 kg), SpO2 98 %.    HEENT: Neck without mass Lymphatics: No cervical, supraclavicular, axillary, or inguinal nodes Resp: Lungs with end inspiratory fine rales at the left upper posterior chest, no respiratory distress Cardio: Regular rate and rhythm GI: No hepatosplenomegaly Vascular: No leg edema Musculoskeletal: No pain with motion at the right shoulder no tenderness at the right upper arm    Lab Results:  Lab Results  Component Value Date   WBC 7.2 06/19/2017   HGB 13.1 06/19/2017   HCT 39.6 06/19/2017   MCV 92.5 06/19/2017   PLT 276 06/19/2017   NEUTROABS 5.0 06/19/2017    CMP  Lab Results  Component Value Date   NA 140 11/06/2017   K 5.0 11/06/2017   CL 102 11/06/2017   CO2 28 11/06/2017   GLUCOSE 118 (H) 11/06/2017   BUN 12 11/09/2017   CREATININE 0.75 11/06/2017   CALCIUM 9.7 11/06/2017   PROT 6.0 (L) 05/25/2017   ALBUMIN 3.6 05/25/2017   AST 25 05/25/2017   ALT 23 05/25/2017   ALKPHOS 72 05/25/2017   BILITOT 0.2 (L) 05/25/2017   GFRNONAA >60 11/06/2017   GFRAA >60 11/06/2017     Medications: I have reviewed the patient's current medications.   Assessment/Plan:  1. High-grade B-cell non-Hodgkin's lymphoma, molecular studies pending, clinical stage IV, IPI-low intermediate risk, FISH panel revealed an 8q24/14q85fsion (MYC/IgH), negative  for BCL 6 and BCL-2  Chest x-ray 01/15/2017-medial right upper lobe/perihilar mass.   Chest CT 01/19/2017-mass constricting the superior vena cava, subcarinal adenopathy, right suprahilar/mediastinal mass.   12/26/2018status postbronchoscopy with transbronchial biopsy of level 7node and biopsy of aright upper lobe lung mass by Dr. GServando Snare There was compression of the right mainstem bronchus. A mass was noted in the right upper lobe. Mediastinal lymph nodes were seen on EBUS. Multiple biopsies of a level 7 node were obtained. Brushings and a biopsy of the right upper lobe mass were obtained. 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 Final pathology was nondiagnostic.   PET scan on 02/02/2017 showed hypermetabolism corresponding to the right sided mediastinal mass,progressive since the CT 01/19/2017. Metastatic disease/lymphoma within the bones, pericardium, stomach, bowel and abdominopelvic nodal stations.   Brain MRI 02/03/2017 showed no evidence of intracranial metastases or acute abnormality.   1/7/2019status postmediastinoscopy.  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 Molecular studies are pending.  Rituximab 02/08/2017  Cycle 1 CHOP 02/09/2017  Cycle 1 R-EPOCH 03/02/2017  Cycle2 R-EPOCH2/22/2019  Prophylactic intrathecal methotrexate 03/27/2017  Restaging PET scan 04/11/2017-significant decrease in FDG uptake associated with the chest, abdomen and pelvis and axial and proximal appendicular skeleton. No new foci of abnormal radiotracer uptake identified.  Cycle 3R-EPOCH3/15/2019with intrathecal methotrexate prophylaxis  Cycle 4R-EPOCH4/5/2019with intrathecal methotrexate prophylaxis  Cycle 5 R-EPOCH4/26/2019with intrathecal methotrexate prophylaxis  2. Chest/upper backand right armpain secondary to #1- resolved. 3.  Exertional dyspnea secondary to #1 and COPD.Improved. 4. Asthma 5. Diabetes  mellitus 6. Gout 7. Remote history of "carcinoid syndrome" 8. Remote history of tobacco use 9. Basal cell carcinoma removed from the right face 10. Chronic low back pain status post epidural steroid injections-followed at a pain management clinic 11. 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 Constipation secondary to narcotic analgesics and potentially lymphoma involving the GI tract-improved. 13. Right first toenail infection 06/19/2017- prescribeddoxycycline    Disposition: [name] remains in clinical remission from non-Hodgkin's lymphoma.  He will contact us for consistent pain at the right arm or shoulder.  He will return for an office visit in 4 months.  Betsy Coder, MD  12/10/2017  8:59 AM

## 2018-01-02 ENCOUNTER — Other Ambulatory Visit: Payer: Self-pay | Admitting: Neurosurgery

## 2018-01-04 ENCOUNTER — Other Ambulatory Visit: Payer: Self-pay | Admitting: Family Medicine

## 2018-01-09 NOTE — Pre-Procedure Instructions (Signed)
Jeffery Wood  01/09/2018      [facility] at [place], [address] [place] [address] Phone: [phone] Fax: [phone]    Your procedure is scheduled on December 19th.  Report to [facility] at [place].M.  Call this number if you have problems the morning of surgery:  [phone]   Remember:  Do not eat or drink after midnight.    Take these medicines the morning of surgery with A SIP OF WATER   acetaminophen (TYLENOL) If needed albuterol (PROVENTIL) (2.5 MG/3ML) 0.083% nebulizer solution If needed Fluticasone-Salmeterol (ADVAIR)  levalbuterol (XOPENEX HFA) If needed loratadine (CLARITIN)  If needed omeprazole (PRILOSEC)  tamsulosin (FLOMAX)  tiotropium (SPIRIVA) traMADol (ULTRAM) if needed    7 days prior to surgery STOP taking any celecoxib (CELEBREX), etodolac (LODINE), Aspirin(unless otherwise instructed by your surgeon), Aleve, Naproxen, Ibuprofen, Motrin, Advil, Goody's, BC's, all herbal medications, fish oil, and all vitamins   WHAT DO I DO ABOUT MY DIABETES MEDICATION?   Marland Kitchen Do not take oral diabetes medicines (pills) the morning of surgery: glimepiride (AMARYL) and metFORMIN (GLUCOPHAGE).   . The day of surgery, do not take other diabetes injectables, including Byetta (exenatide), Bydureon (exenatide ER), Victoza (liraglutide), or Trulicity (dulaglutide).  . If your CBG is greater than 220 mg/dL, you may take  of your sliding scale (correction) dose of insulin.   How to Manage Your Diabetes Before and After Surgery  Why is it important to control my blood sugar before and after surgery? . Improving blood sugar levels before and after surgery helps healing and can limit problems. . A way of improving blood sugar control is eating a healthy diet by: o  Eating less sugar and carbohydrates o  Increasing activity/exercise o  Talking with your doctor about  reaching your blood sugar goals . High blood sugars (greater than 180 mg/dL) can raise your risk of infections and slow your recovery, so you will need to focus on controlling your diabetes during the weeks before surgery. . Make sure that the doctor who takes care of your diabetes knows about your planned surgery including the date and location.  How do I manage my blood sugar before surgery? . Check your blood sugar at least 4 times a day, starting 2 days before surgery, to make sure that the level is not too high or low. o Check your blood sugar the morning of your surgery when you wake up and every 2 hours until you get to the Short Stay unit. . If your blood sugar is less than 70 mg/dL, you will need to treat for low blood sugar: o Do not take insulin. o Treat a low blood sugar (less than 70 mg/dL) with  cup of clear juice (cranberry or apple), 4 glucose tablets, OR glucose gel. o Recheck blood sugar in 15 minutes after treatment (to make sure it is greater than 70 mg/dL). If your blood sugar is not greater than 70 mg/dL on recheck, call [phone] for further instructions. . Report your blood sugar to the short stay nurse when you get to Short Stay.  . If you are admitted to the hospital after surgery: o Your blood sugar will be checked by the staff and you will probably be given insulin after surgery (instead of oral diabetes medicines) to make sure you have good blood sugar levels. o The goal for blood sugar control  after surgery is 80-180 mg/dL.   Do not wear jewelry.  Do not wear lotions, powders, or colognes, or deodorant.  Men may shave face and neck.  Do not bring valuables to the hospital.  [facility] is not responsible for any belongings or valuables.  Contacts, dentures or bridgework may not be worn into surgery.  Leave your suitcase in the car.  After surgery it may be brought to your room.  For patients admitted to the hospital, discharge time will be determined by your  treatment team.  Patients discharged the day of surgery will not be allowed to drive home.    [place]- Preparing For Surgery  Before surgery, you can play an important role. Because skin is not sterile, your skin needs to be as free of germs as possible. You can reduce the number of germs on your skin by washing with CHG (chlorahexidine gluconate) Soap before surgery.  CHG is an antiseptic cleaner which kills germs and bonds with the skin to continue killing germs even after washing.    Oral Hygiene is also important to reduce your risk of infection.  Remember - BRUSH YOUR TEETH THE MORNING OF SURGERY WITH YOUR REGULAR TOOTHPASTE  Please do not use if you have an allergy to CHG or antibacterial soaps. If your skin becomes reddened/irritated stop using the CHG.  Do not shave (including legs and underarms) for at least 48 hours prior to first CHG shower. It is OK to shave your face.  Please follow these instructions carefully.   1. Shower the NIGHT BEFORE SURGERY and the MORNING OF SURGERY with CHG.   2. If you chose to wash your hair, wash your hair first as usual with your normal shampoo.  3. After you shampoo, rinse your hair and body thoroughly to remove the shampoo.  4. Use CHG as you would any other liquid soap. You can apply CHG directly to the skin and wash gently with a scrungie or a clean washcloth.   5. Apply the CHG Soap to your body ONLY FROM THE NECK DOWN.  Do not use on open wounds or open sores. Avoid contact with your eyes, ears, mouth and genitals (private parts). Wash Face and genitals (private parts)  with your normal soap.  6. Wash thoroughly, paying special attention to the area where your surgery will be performed.  7. Thoroughly rinse your body with warm water from the neck down.  8. DO NOT shower/wash with your normal soap after using and rinsing off the CHG Soap.  9. Pat yourself dry with a CLEAN TOWEL.  10. Wear CLEAN PAJAMAS to bed the night before  surgery, wear comfortable clothes the morning of surgery  11. Place CLEAN SHEETS on your bed the night of your first shower and DO NOT SLEEP WITH PETS.    Day of Surgery:  Do not apply any deodorants/lotions.  Please wear clean clothes to the hospital/surgery center.   Remember to brush your teeth WITH YOUR REGULAR TOOTHPASTE.    Please read over the following fact sheets that you were given.

## 2018-01-10 ENCOUNTER — Other Ambulatory Visit: Payer: Self-pay

## 2018-01-10 ENCOUNTER — Encounter (HOSPITAL_COMMUNITY)
Admission: RE | Admit: 2018-01-10 | Discharge: 2018-01-10 | Disposition: A | Discharge status: Home / Self Care | Payer: BC Managed Care – PPO | Source: Ambulatory Visit | Attending: Neurosurgery | Admitting: Neurosurgery

## 2018-01-10 ENCOUNTER — Encounter (HOSPITAL_COMMUNITY): Payer: Self-pay

## 2018-01-10 DIAGNOSIS — Z01812 Encounter for preprocedural laboratory examination: Secondary | ICD-10-CM | POA: Insufficient documentation

## 2018-01-10 HISTORY — DX: Non-Hodgkin lymphoma, unspecified, in remission: C85.9A

## 2018-01-10 HISTORY — DX: Myoneural disorder, unspecified: G70.9

## 2018-01-10 HISTORY — DX: Non-Hodgkin lymphoma, unspecified, unspecified site: C85.90

## 2018-01-10 LAB — BASIC METABOLIC PANEL
Anion gap: 11 (ref 5–15)
BUN: 13 mg/dL (ref 8–23)
CALCIUM: 9.5 mg/dL (ref 8.9–10.3)
CHLORIDE: 101 mmol/L (ref 98–111)
CO2: 25 mmol/L (ref 22–32)
Creatinine, Ser: 0.75 mg/dL (ref 0.61–1.24)
GLUCOSE: 110 mg/dL — AB (ref 70–99)
Potassium: 4.2 mmol/L (ref 3.5–5.1)
SODIUM: 137 mmol/L (ref 135–145)

## 2018-01-10 LAB — SURGICAL PCR SCREEN
MRSA, PCR: NEGATIVE
Staphylococcus aureus: NEGATIVE

## 2018-01-10 LAB — CBC
HEMATOCRIT: 48.3 % (ref 39.0–52.0)
Hemoglobin: 15.8 g/dL (ref 13.0–17.0)
MCH: 29.8 pg (ref 26.0–34.0)
MCHC: 32.7 g/dL (ref 30.0–36.0)
MCV: 91 fL (ref 80.0–100.0)
NRBC: 0 % (ref 0.0–0.2)
Platelets: 259 10*3/uL (ref 150–400)
RBC: 5.31 MIL/uL (ref 4.22–5.81)
RDW: 13.5 % (ref 11.5–15.5)
WBC: 9.3 10*3/uL (ref 4.0–10.5)

## 2018-01-10 LAB — TYPE AND SCREEN
ABO/RH(D): O POS
Antibody Screen: NEGATIVE

## 2018-01-10 LAB — HEMOGLOBIN A1C
Hgb A1c MFr Bld: 5.4 % (ref 4.8–5.6)
Mean Plasma Glucose: 108.28 mg/dL

## 2018-01-10 LAB — GLUCOSE, CAPILLARY: GLUCOSE-CAPILLARY: 101 mg/dL — AB (ref 70–99)

## 2018-01-10 NOTE — Progress Notes (Signed)
PCP: Dimas Chyle, MD  Cardiologist: pt denies  EKG: 03/12/17 in EPIC  Stress test: pt denies  ECHO: 02/08/17 in EPIC  Cardiac Cath: 2012 in EPIC  Chest x-ray: 05/25/17 in [facility]

## 2018-01-11 NOTE — Anesthesia Preprocedure Evaluation (Addendum)
Anesthesia Evaluation  Patient identified by MRN, date of birth, ID band Patient awake    Reviewed: Allergy & Precautions, NPO status , Patient's Chart, lab work & pertinent test results  History of Anesthesia Complications Negative for: history of anesthetic complications  Airway Mallampati: I  TM Distance: >3 FB Neck ROM: Full    Dental  (+) Dental Advisory Given,    Pulmonary sleep apnea (does not require CPAP) , COPD,  COPD inhaler, former smoker (quit 1990),  Mediastinal mass   breath sounds clear to auscultation       Cardiovascular hypertension, Pt. on medications (-) angina(-) CAD  Rhythm:Regular Rate:Normal  1/19 ECHO: EF 60-65%, valves OK '12 Cath: No angiographic evidence of significant CAD, normal LVEF      Neuro/Psych  Headaches, Chronic back pain    GI/Hepatic Neg liver ROS, GERD  Medicated and Controlled,  Endo/Other  diabetes (glu 99), Oral Hypoglycemic Agentsobese  Renal/GU negative Renal ROS     Musculoskeletal   Abdominal (+) + obese,   Peds  Hematology H/o lymphoma: chemo   Anesthesia Other Findings H/o basal cell and lymphoma: chemo  Reproductive/Obstetrics                          Anesthesia Physical Anesthesia Plan  ASA: III  Anesthesia Plan: General   Post-op Pain Management:    Induction: Intravenous  PONV Risk Score and Plan: 3 and Ondansetron and Dexamethasone  Airway Management Planned: Oral ETT  Additional Equipment:   Intra-op Plan:   Post-operative Plan: Extubation in OR  Informed Consent: I have reviewed the patients History and Physical, chart, labs and discussed the procedure including the risks, benefits and alternatives for the proposed anesthesia with the patient or authorized representative who has indicated his/her understanding and acceptance.   Dental advisory given  Plan Discussed with: CRNA and Surgeon  Anesthesia Plan  Comments: (See PAT note 01/10/2018 by Karoline Caldwell, PA-C Plan routine monitors, GETA )      Anesthesia Quick Evaluation

## 2018-01-11 NOTE — Progress Notes (Signed)
Anesthesia Chart Review:  Case:  [id number] Date/Time:  01/17/18 0715   Procedure:  POSTERIOR LUMBAR INTERBODY FUSION, INTERBODY PROSTHESIS, POSTERIOR INSTRUMENTATION AND FUSION LUMBAR 4- LUMBAR 5 (N/A ) - POSTERIOR LUMBAR INTERBODY FUSION, INTERBODY PROSTHESIS, POSTERIOR INSTRUMENTATION AND FUSION LUMBAR 4- LUMBAR 5   Anesthesia type:  General   Pre-op diagnosis:  SPONDYLOLISTHESIS, LUMBAR REGION   Location:  [facility] OR ROOM 19 / [facility]   Surgeon:  Newman Pies, MD      DISCUSSION: 62 yo male former smoker. Pertinent hx includes HTN, Asthma, COPD, OSA, Lymphoma in remission, GERD, DMII, DOE, Gout.  Follows with oncology, Dr. Benay Spice, for treatment of non-Hodgkin's lymphoma. See his note from 12/10/2017 for detailed treatment history. Per that note, the pt's exertional dyspnea has improved since completing treatment. He also had resolution of SVC syndrome that was secondary to compressive mediastinal mass.   Echo 02/08/2017 with EF 60-65%, mild LVH, grade 1dd, no significant valvular abnormalities.   Anticipate he can proceed as planned barring acute status change.   VS: BP 122/73   Pulse 90   Temp 36.8 C (Oral)   Resp 18   Ht 6\' 3"  (1.905 m)   Wt 114.8 kg   SpO2 98%   BMI 31.62 kg/m   PROVIDERS: [name], MD is PCP last seen 11/29/2017  Betsy Coder, MD is Oncologist, last seen 12/10/2017  LABS: Labs reviewed: Acceptable for surgery. (all labs ordered are listed, but only abnormal results are displayed)  Labs Reviewed  GLUCOSE, CAPILLARY - Abnormal; Notable for the following components:      Result Value   Glucose-Capillary 101 (*)    All other components within normal limits  BASIC METABOLIC PANEL - Abnormal; Notable for the following components:   Glucose, Bld 110 (*)    All other components within normal limits  SURGICAL PCR SCREEN  CBC  HEMOGLOBIN A1C  TYPE AND SCREEN     IMAGES: CHEST - 2 VIEW 05/25/2017: COMPARISON:  05/04/2017  FINDINGS: Power port  in place. There appears to be slightly more deformity of the catheter as it passes underneath the clavicle than was seen on the previous study. Left lung remains clear. Right hilar prominence with right mid lung scarring or volume loss appears the same. No new abnormality. No significant bone finding.  IMPRESSION: Stable appearance of right hilar prominence and right perihilar pulmonary scarring or atelectasis.  Power port tubing appears somewhat more deformed as it passes adjacent to and underneath the clavicle. [facility] has since been removed)  EKG: 03/12/2017: Sinus rhythm with Premature atrial complexes. Rate 93.  CV: TTE 02/08/2017: Study Conclusions  - Left ventricle: The cavity size was normal. There was mild   concentric hypertrophy. Systolic function was normal. The   estimated ejection fraction was in the range of 60% to 65%.   Although no diagnostic regional wall motion abnormality was   identified, this possibility cannot be completely excluded on the   basis of this study. Doppler parameters are consistent with   abnormal left ventricular relaxation (grade 1 diastolic   dysfunction). - Ventricular septum: Septal motion showed &quot;bounce&quot;. These changes   are consistent with RV-LV interaction. - Aortic valve: Mildly calcified annulus. Trileaflet; normal   thickness leaflets.  Complications:  Echo was requested to be completed quickly for chemo and port access.  Cath 12/07/2010: Impression: 1. No angiographic evidence of significant CAD to explain Chest pain or stress test results. 2.  Non-cardiac / Non-anginal chest pain 3.  Normal  LVEF   Past Medical History:  Diagnosis Date  . Arthritis   . Asthma   . Basal cell carcinoma (BCC) of right temple region   . Cancer ([place])    basal cell of right temple; carcinoids  . Complication of anesthesia    hard to wake up after bronchoscopy  . COPD (chronic obstructive pulmonary disease) ([place])   . Diabetes  ([place])   . Dyspnea   . GERD (gastroesophageal reflux disease)   . Gout   . Headache   . History of hiatal hernia 05/16/2006   small noted on EGD  . Hypertension   . Lung mass   . 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[place])    last chemo 05/2017, in remission  . Neuromuscular disorder ([place])   . 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  . Sleep apnea    uses mouth piece    Past Surgical History:  Procedure Laterality Date  . BICEPS TENDON REPAIR Left   . CARPAL TUNNEL RELEASE    . COLONOSCOPY    . FINGER SURGERY    . KNEE ARTHROSCOPY     ACL tear  . LEFT HEART CATHETERIZATION WITH CORONARY ANGIOGRAM N/A 12/07/2010   Procedure: LEFT HEART CATHETERIZATION WITH CORONARY ANGIOGRAM;  Surgeon: Leonie Man, MD;  Location: [facility] CATH LAB;  Service: Cardiovascular;  Laterality: N/A;  . PILONIDAL CYST EXCISION    . PORT-A-CATH REMOVAL N/A 11/09/2017   Procedure: REMOVAL PORT-A-CATH;  Surgeon: [name], MD;  Location: [facility];  Service: General;  Laterality: N/A;  local  . PORTACATH PLACEMENT Left 02/27/2017   Procedure: INSERTION PORT-A-CATH;  Surgeon: [name], MD;  Location: [facility];  Service: General;  Laterality: Left;  . UPPER GASTROINTESTINAL ENDOSCOPY  05/16/2006  . VIDEO BRONCHOSCOPY WITH ENDOBRONCHIAL ULTRASOUND N/A 01/24/2017   Procedure: VIDEO BRONCHOSCOPY WITH ENDOBRONCHIAL ULTRASOUND with TRANSBRONCHIAL BIOPSY;  Surgeon: Grace Isaac, MD;  Location: [facility];  Service: Thoracic;  Laterality: N/A;  . VIDEO MEDIASTINOSCOPY N/A 02/05/2017   Procedure: VIDEO MEDIASTINOSCOPY;  Surgeon: Grace Isaac, MD;  Location: [facility];  Service: Thoracic;  Laterality: N/A;    MEDICATIONS: . acetaminophen (TYLENOL) 650 MG CR tablet  . albuterol (PROVENTIL) (2.5 MG/3ML) 0.083% nebulizer solution  . aspirin EC 81 MG tablet  . betamethasone dipropionate (DIPROLENE) 0.05 % cream  . celecoxib (CELEBREX) 200 MG capsule  . clonazePAM (KLONOPIN) 0.5 MG tablet  . etodolac (LODINE) 400  MG tablet  . glimepiride (AMARYL) 4 MG tablet  . levalbuterol (XOPENEX HFA) 45 MCG/ACT inhaler  . loratadine (CLARITIN) 10 MG tablet  . losartan (COZAAR) 100 MG tablet  . metFORMIN (GLUCOPHAGE) 1000 MG tablet  . Multiple Vitamins-Minerals (MULTIVITAMINS THER. W/MINERALS) TABS  . omeprazole (PRILOSEC) 20 MG capsule  . tiotropium (SPIRIVA) 18 MCG inhalation capsule  . traMADol (ULTRAM) 50 MG tablet   No current facility-administered medications for this encounter.      Wynonia Musty [facility] Short Stay Center/Anesthesiology Phone [phone] 01/11/2018 12:26 PM

## 2018-01-17 ENCOUNTER — Inpatient Hospital Stay (HOSPITAL_COMMUNITY): Payer: BC Managed Care – PPO

## 2018-01-17 ENCOUNTER — Encounter (HOSPITAL_COMMUNITY): Payer: Self-pay | Admitting: *Deleted

## 2018-01-17 ENCOUNTER — Encounter (HOSPITAL_COMMUNITY)
Admission: RE | Disposition: A | Discharge status: Home / Self Care | Payer: Self-pay | Source: Home / Self Care | Attending: Neurosurgery

## 2018-01-17 ENCOUNTER — Inpatient Hospital Stay (HOSPITAL_COMMUNITY)
Admission: RE | Admit: 2018-01-17 | Discharge: 2018-01-18 | DRG: 455 | Disposition: A | Discharge status: Home / Self Care | Payer: BC Managed Care – PPO | Attending: Neurosurgery | Admitting: Neurosurgery

## 2018-01-17 ENCOUNTER — Inpatient Hospital Stay (HOSPITAL_COMMUNITY): Payer: BC Managed Care – PPO | Admitting: Anesthesiology

## 2018-01-17 ENCOUNTER — Inpatient Hospital Stay (HOSPITAL_COMMUNITY): Payer: BC Managed Care – PPO | Admitting: Physician Assistant

## 2018-01-17 DIAGNOSIS — Z7984 Long term (current) use of oral hypoglycemic drugs: Secondary | ICD-10-CM

## 2018-01-17 DIAGNOSIS — Z7982 Long term (current) use of aspirin: Secondary | ICD-10-CM

## 2018-01-17 DIAGNOSIS — M109 Gout, unspecified: Secondary | ICD-10-CM | POA: Diagnosis present

## 2018-01-17 DIAGNOSIS — Z8249 Family history of ischemic heart disease and other diseases of the circulatory system: Secondary | ICD-10-CM | POA: Diagnosis not present

## 2018-01-17 DIAGNOSIS — M4316 Spondylolisthesis, lumbar region: Secondary | ICD-10-CM | POA: Diagnosis present

## 2018-01-17 DIAGNOSIS — Z6831 Body mass index (BMI) 31.0-31.9, adult: Secondary | ICD-10-CM | POA: Diagnosis not present

## 2018-01-17 DIAGNOSIS — M1990 Unspecified osteoarthritis, unspecified site: Secondary | ICD-10-CM | POA: Diagnosis present

## 2018-01-17 DIAGNOSIS — G4730 Sleep apnea, unspecified: Secondary | ICD-10-CM | POA: Diagnosis present

## 2018-01-17 DIAGNOSIS — Z419 Encounter for procedure for purposes other than remedying health state, unspecified: Secondary | ICD-10-CM

## 2018-01-17 DIAGNOSIS — M48061 Spinal stenosis, lumbar region without neurogenic claudication: Secondary | ICD-10-CM | POA: Diagnosis present

## 2018-01-17 DIAGNOSIS — E119 Type 2 diabetes mellitus without complications: Secondary | ICD-10-CM | POA: Diagnosis present

## 2018-01-17 DIAGNOSIS — Z79899 Other long term (current) drug therapy: Secondary | ICD-10-CM | POA: Diagnosis not present

## 2018-01-17 DIAGNOSIS — I10 Essential (primary) hypertension: Secondary | ICD-10-CM | POA: Diagnosis present

## 2018-01-17 DIAGNOSIS — Z87891 Personal history of nicotine dependence: Secondary | ICD-10-CM | POA: Diagnosis not present

## 2018-01-17 DIAGNOSIS — E669 Obesity, unspecified: Secondary | ICD-10-CM | POA: Diagnosis present

## 2018-01-17 DIAGNOSIS — K219 Gastro-esophageal reflux disease without esophagitis: Secondary | ICD-10-CM | POA: Diagnosis present

## 2018-01-17 DIAGNOSIS — J449 Chronic obstructive pulmonary disease, unspecified: Secondary | ICD-10-CM | POA: Diagnosis present

## 2018-01-17 DIAGNOSIS — M4696 Unspecified inflammatory spondylopathy, lumbar region: Secondary | ICD-10-CM | POA: Diagnosis present

## 2018-01-17 DIAGNOSIS — Z8572 Personal history of non-Hodgkin lymphomas: Secondary | ICD-10-CM

## 2018-01-17 DIAGNOSIS — M5116 Intervertebral disc disorders with radiculopathy, lumbar region: Secondary | ICD-10-CM | POA: Diagnosis present

## 2018-01-17 DIAGNOSIS — Z9221 Personal history of antineoplastic chemotherapy: Secondary | ICD-10-CM | POA: Diagnosis not present

## 2018-01-17 DIAGNOSIS — M549 Dorsalgia, unspecified: Secondary | ICD-10-CM | POA: Diagnosis present

## 2018-01-17 DIAGNOSIS — Z85828 Personal history of other malignant neoplasm of skin: Secondary | ICD-10-CM | POA: Diagnosis not present

## 2018-01-17 LAB — GLUCOSE, CAPILLARY
GLUCOSE-CAPILLARY: 99 mg/dL (ref 70–99)
GLUCOSE-CAPILLARY: 260 mg/dL — AB (ref 70–99)
Glucose-Capillary: 160 mg/dL — ABNORMAL HIGH (ref 70–99)
Glucose-Capillary: 251 mg/dL — ABNORMAL HIGH (ref 70–99)

## 2018-01-17 SURGERY — POSTERIOR LUMBAR FUSION 1 LEVEL
Anesthesia: General | Site: Spine Lumbar

## 2018-01-17 MED ORDER — MIDAZOLAM HCL 2 MG/2ML IJ SOLN
INTRAMUSCULAR | Status: AC
  Filled 2018-01-17: qty 2

## 2018-01-17 MED ORDER — ONDANSETRON HCL 4 MG/2ML IJ SOLN: 4.0000 mg | Freq: Four times a day (QID) | INTRAMUSCULAR | Status: DC | PRN

## 2018-01-17 MED ORDER — FENTANYL CITRATE (PF) 100 MCG/2ML IJ SOLN
INTRAMUSCULAR | Status: DC | PRN
  Administered 2018-01-17 (×3): 50 ug via INTRAVENOUS

## 2018-01-17 MED ORDER — PROPOFOL 10 MG/ML IV BOLUS
INTRAVENOUS | Status: DC | PRN
  Administered 2018-01-17: 150 mg via INTRAVENOUS

## 2018-01-17 MED ORDER — PROMETHAZINE HCL 25 MG/ML IJ SOLN: 6.2500 mg | INTRAMUSCULAR | Status: DC | PRN

## 2018-01-17 MED ORDER — CHLORHEXIDINE GLUCONATE CLOTH 2 % EX PADS: 6.0000 | MEDICATED_PAD | Freq: Once | CUTANEOUS | Status: DC

## 2018-01-17 MED ORDER — HYDROMORPHONE HCL 1 MG/ML IJ SOLN
0.2500 mg | INTRAMUSCULAR | Status: DC | PRN
  Administered 2018-01-17 (×4): 0.5 mg via INTRAVENOUS

## 2018-01-17 MED ORDER — DEXAMETHASONE SODIUM PHOSPHATE 10 MG/ML IJ SOLN
INTRAMUSCULAR | Status: DC | PRN
  Administered 2018-01-17: 10 mg via INTRAVENOUS

## 2018-01-17 MED ORDER — LACTATED RINGERS IV SOLN
INTRAVENOUS | Status: DC | PRN
  Administered 2018-01-17 (×2): via INTRAVENOUS

## 2018-01-17 MED ORDER — THROMBIN 5000 UNITS EX SOLR
OROMUCOSAL | Status: DC | PRN
  Administered 2018-01-17: 5 mL via TOPICAL

## 2018-01-17 MED ORDER — LORATADINE 10 MG PO TABS
10.0000 mg | ORAL_TABLET | Freq: Every day | ORAL | Status: DC
  Administered 2018-01-17: 10 mg via ORAL
  Filled 2018-01-17: qty 1

## 2018-01-17 MED ORDER — HYDROMORPHONE HCL 1 MG/ML IJ SOLN
INTRAMUSCULAR | Status: AC
  Filled 2018-01-17: qty 1

## 2018-01-17 MED ORDER — BACITRACIN ZINC 500 UNIT/GM EX OINT
TOPICAL_OINTMENT | CUTANEOUS | Status: AC
  Filled 2018-01-17: qty 28.35

## 2018-01-17 MED ORDER — ALBUMIN HUMAN 5 % IV SOLN
INTRAVENOUS | Status: DC | PRN
  Administered 2018-01-17: 11:00:00 via INTRAVENOUS

## 2018-01-17 MED ORDER — BUPIVACAINE LIPOSOME 1.3 % IJ SUSP
20.0000 mL | INTRAMUSCULAR | Status: AC
  Administered 2018-01-17: 20 mL
  Filled 2018-01-17: qty 20

## 2018-01-17 MED ORDER — ONDANSETRON HCL 4 MG/2ML IJ SOLN
INTRAMUSCULAR | Status: AC
  Filled 2018-01-17: qty 2

## 2018-01-17 MED ORDER — LIDOCAINE 2% (20 MG/ML) 5 ML SYRINGE
INTRAMUSCULAR | Status: DC | PRN
  Administered 2018-01-17: 40 mg via INTRAVENOUS

## 2018-01-17 MED ORDER — VANCOMYCIN HCL 1000 MG IV SOLR
INTRAVENOUS | Status: AC
  Filled 2018-01-17: qty 1000

## 2018-01-17 MED ORDER — DOCUSATE SODIUM 100 MG PO CAPS
100.0000 mg | ORAL_CAPSULE | Freq: Two times a day (BID) | ORAL | Status: DC
  Administered 2018-01-17 (×2): 100 mg via ORAL
  Filled 2018-01-17 (×2): qty 1

## 2018-01-17 MED ORDER — OXYCODONE HCL 5 MG PO TABS
10.0000 mg | ORAL_TABLET | ORAL | Status: DC | PRN
  Administered 2018-01-17 – 2018-01-18 (×8): 10 mg via ORAL
  Filled 2018-01-17 (×7): qty 2

## 2018-01-17 MED ORDER — ACETAMINOPHEN 325 MG PO TABS: 650.0000 mg | ORAL_TABLET | ORAL | Status: DC | PRN

## 2018-01-17 MED ORDER — FENTANYL CITRATE (PF) 250 MCG/5ML IJ SOLN
INTRAMUSCULAR | Status: AC
  Filled 2018-01-17: qty 5

## 2018-01-17 MED ORDER — ROCURONIUM BROMIDE 50 MG/5ML IV SOSY
PREFILLED_SYRINGE | INTRAVENOUS | Status: AC
  Filled 2018-01-17: qty 5

## 2018-01-17 MED ORDER — PANTOPRAZOLE SODIUM 40 MG PO TBEC
40.0000 mg | DELAYED_RELEASE_TABLET | Freq: Every day | ORAL | Status: DC
  Administered 2018-01-17: 40 mg via ORAL
  Filled 2018-01-17: qty 1

## 2018-01-17 MED ORDER — OXYCODONE HCL 5 MG PO TABS
ORAL_TABLET | ORAL | Status: AC
  Filled 2018-01-17: qty 2

## 2018-01-17 MED ORDER — CEFAZOLIN SODIUM-DEXTROSE 2-4 GM/100ML-% IV SOLN
2.0000 g | INTRAVENOUS | Status: DC
  Filled 2018-01-17: qty 100

## 2018-01-17 MED ORDER — SODIUM CHLORIDE 0.9% FLUSH: 3.0000 mL | INTRAVENOUS | Status: DC | PRN

## 2018-01-17 MED ORDER — ONDANSETRON HCL 4 MG/2ML IJ SOLN
INTRAMUSCULAR | Status: DC | PRN
  Administered 2018-01-17: 4 mg via INTRAVENOUS

## 2018-01-17 MED ORDER — MORPHINE SULFATE (PF) 4 MG/ML IV SOLN: 4.0000 mg | INTRAVENOUS | Status: DC | PRN

## 2018-01-17 MED ORDER — TIOTROPIUM BROMIDE MONOHYDRATE 18 MCG IN CAPS: 18.0000 ug | ORAL_CAPSULE | Freq: Every day | RESPIRATORY_TRACT | Status: DC

## 2018-01-17 MED ORDER — GLIMEPIRIDE 2 MG PO TABS: 2.0000 mg | ORAL_TABLET | Freq: Every day | ORAL | Status: DC

## 2018-01-17 MED ORDER — ROCURONIUM BROMIDE 50 MG/5ML IV SOSY
PREFILLED_SYRINGE | INTRAVENOUS | Status: DC | PRN
  Administered 2018-01-17: 20 mg via INTRAVENOUS
  Administered 2018-01-17 (×2): 50 mg via INTRAVENOUS

## 2018-01-17 MED ORDER — CEFAZOLIN SODIUM-DEXTROSE 2-4 GM/100ML-% IV SOLN
2.0000 g | Freq: Three times a day (TID) | INTRAVENOUS | Status: AC
  Administered 2018-01-17 – 2018-01-18 (×2): 2 g via INTRAVENOUS
  Filled 2018-01-17 (×2): qty 100

## 2018-01-17 MED ORDER — MIDAZOLAM HCL 2 MG/2ML IJ SOLN: 0.5000 mg | Freq: Once | INTRAMUSCULAR | Status: DC | PRN

## 2018-01-17 MED ORDER — VANCOMYCIN HCL 1 G IV SOLR
INTRAVENOUS | Status: DC | PRN
  Administered 2018-01-17: 1000 mg via TOPICAL

## 2018-01-17 MED ORDER — ACETAMINOPHEN ER 650 MG PO TBCR: 1300.0000 mg | EXTENDED_RELEASE_TABLET | Freq: Three times a day (TID) | ORAL | Status: DC | PRN

## 2018-01-17 MED ORDER — BUPIVACAINE-EPINEPHRINE (PF) 0.25% -1:200000 IJ SOLN
INTRAMUSCULAR | Status: AC
  Filled 2018-01-17: qty 30

## 2018-01-17 MED ORDER — ONDANSETRON HCL 4 MG PO TABS: 4.0000 mg | ORAL_TABLET | Freq: Four times a day (QID) | ORAL | Status: DC | PRN

## 2018-01-17 MED ORDER — ACETAMINOPHEN 500 MG PO TABS
1000.0000 mg | ORAL_TABLET | Freq: Four times a day (QID) | ORAL | Status: AC
  Administered 2018-01-17 – 2018-01-18 (×4): 1000 mg via ORAL
  Filled 2018-01-17 (×4): qty 2

## 2018-01-17 MED ORDER — SODIUM CHLORIDE 0.9 % IV SOLN
INTRAVENOUS | Status: DC | PRN
  Administered 2018-01-17: 500 mL

## 2018-01-17 MED ORDER — SODIUM CHLORIDE 0.9% FLUSH: 3.0000 mL | Freq: Two times a day (BID) | INTRAVENOUS | Status: DC

## 2018-01-17 MED ORDER — LOSARTAN POTASSIUM 50 MG PO TABS
100.0000 mg | ORAL_TABLET | Freq: Every day | ORAL | Status: DC
  Filled 2018-01-17: qty 2

## 2018-01-17 MED ORDER — THROMBIN 5000 UNITS EX SOLR
CUTANEOUS | Status: AC
  Filled 2018-01-17: qty 5000

## 2018-01-17 MED ORDER — SODIUM CHLORIDE 0.9 % IV SOLN: 250.0000 mL | INTRAVENOUS | Status: DC

## 2018-01-17 MED ORDER — MIDAZOLAM HCL 5 MG/5ML IJ SOLN
INTRAMUSCULAR | Status: DC | PRN
  Administered 2018-01-17: 2 mg via INTRAVENOUS

## 2018-01-17 MED ORDER — 0.9 % SODIUM CHLORIDE (POUR BTL) OPTIME
TOPICAL | Status: DC | PRN
  Administered 2018-01-17: 1000 mL

## 2018-01-17 MED ORDER — ADULT MULTIVITAMIN W/MINERALS CH: 1.0000 | ORAL_TABLET | Freq: Every day | ORAL | Status: DC

## 2018-01-17 MED ORDER — UMECLIDINIUM BROMIDE 62.5 MCG/INH IN AEPB
1.0000 | INHALATION_SPRAY | Freq: Every day | RESPIRATORY_TRACT | Status: DC
  Filled 2018-01-17: qty 7

## 2018-01-17 MED ORDER — MEPERIDINE HCL 50 MG/ML IJ SOLN: 6.2500 mg | INTRAMUSCULAR | Status: DC | PRN

## 2018-01-17 MED ORDER — DEXAMETHASONE SODIUM PHOSPHATE 10 MG/ML IJ SOLN
INTRAMUSCULAR | Status: AC
  Filled 2018-01-17: qty 1

## 2018-01-17 MED ORDER — MENTHOL 3 MG MT LOZG: 1.0000 | LOZENGE | OROMUCOSAL | Status: DC | PRN

## 2018-01-17 MED ORDER — PHENOL 1.4 % MT LIQD: 1.0000 | OROMUCOSAL | Status: DC | PRN

## 2018-01-17 MED ORDER — ALBUTEROL SULFATE (2.5 MG/3ML) 0.083% IN NEBU: 2.5000 mg | INHALATION_SOLUTION | Freq: Every evening | RESPIRATORY_TRACT | Status: DC | PRN

## 2018-01-17 MED ORDER — ACETAMINOPHEN 650 MG RE SUPP: 650.0000 mg | RECTAL | Status: DC | PRN

## 2018-01-17 MED ORDER — BACITRACIN ZINC 500 UNIT/GM EX OINT
TOPICAL_OINTMENT | CUTANEOUS | Status: DC | PRN
  Administered 2018-01-17: 1 via TOPICAL

## 2018-01-17 MED ORDER — TRAMADOL HCL 50 MG PO TABS: 50.0000 mg | ORAL_TABLET | Freq: Every evening | ORAL | Status: DC | PRN

## 2018-01-17 MED ORDER — PROPOFOL 10 MG/ML IV BOLUS
INTRAVENOUS | Status: AC
  Filled 2018-01-17: qty 20

## 2018-01-17 MED ORDER — CLONAZEPAM 0.5 MG PO TABS
0.5000 mg | ORAL_TABLET | Freq: Every day | ORAL | Status: DC
  Administered 2018-01-17: 0.5 mg via ORAL
  Filled 2018-01-17: qty 1

## 2018-01-17 MED ORDER — CEFAZOLIN SODIUM-DEXTROSE 2-3 GM-%(50ML) IV SOLR
INTRAVENOUS | Status: DC | PRN
  Administered 2018-01-17: 2 g via INTRAVENOUS

## 2018-01-17 MED ORDER — SUGAMMADEX SODIUM 200 MG/2ML IV SOLN
INTRAVENOUS | Status: DC | PRN
  Administered 2018-01-17: 200 mg via INTRAVENOUS

## 2018-01-17 MED ORDER — LEVALBUTEROL TARTRATE 45 MCG/ACT IN AERO: 2.0000 | INHALATION_SPRAY | Freq: Every day | RESPIRATORY_TRACT | Status: DC | PRN

## 2018-01-17 MED ORDER — CYCLOBENZAPRINE HCL 10 MG PO TABS
ORAL_TABLET | ORAL | Status: AC
  Filled 2018-01-17: qty 1

## 2018-01-17 MED ORDER — BUPIVACAINE-EPINEPHRINE (PF) 0.25% -1:200000 IJ SOLN
INTRAMUSCULAR | Status: DC | PRN
  Administered 2018-01-17: 10 mL

## 2018-01-17 MED ORDER — METFORMIN HCL 500 MG PO TABS
1000.0000 mg | ORAL_TABLET | Freq: Two times a day (BID) | ORAL | Status: DC
  Administered 2018-01-17: 1000 mg via ORAL
  Filled 2018-01-17: qty 2

## 2018-01-17 MED ORDER — BISACODYL 10 MG RE SUPP: 10.0000 mg | Freq: Every day | RECTAL | Status: DC | PRN

## 2018-01-17 MED ORDER — SODIUM CHLORIDE 0.9 % IV SOLN
INTRAVENOUS | Status: DC | PRN
  Administered 2018-01-17: 25 ug/min via INTRAVENOUS

## 2018-01-17 MED ORDER — OXYCODONE HCL 5 MG PO TABS: 5.0000 mg | ORAL_TABLET | ORAL | Status: DC | PRN

## 2018-01-17 MED ORDER — CYCLOBENZAPRINE HCL 10 MG PO TABS
10.0000 mg | ORAL_TABLET | Freq: Three times a day (TID) | ORAL | Status: DC | PRN
  Administered 2018-01-17 – 2018-01-18 (×3): 10 mg via ORAL
  Filled 2018-01-17 (×2): qty 1

## 2018-01-17 SURGICAL SUPPLY — 67 items
APL SKNCLS STERI-STRIP NONHPOA (GAUZE/BANDAGES/DRESSINGS) ×1
BAG DECANTER FOR FLEXI CONT (MISCELLANEOUS) ×2 IMPLANT
BENZOIN TINCTURE PRP APPL 2/3 (GAUZE/BANDAGES/DRESSINGS) ×2 IMPLANT
BLADE CLIPPER SURG (BLADE) ×2 IMPLANT
BUR MATCHSTICK NEURO 3.0 LAGG (BURR) ×4 IMPLANT
BUR PRECISION FLUTE 6.0 (BURR) ×2 IMPLANT
CANISTER SUCT 3000ML PPV (MISCELLANEOUS) ×2 IMPLANT
CAP REVERE LOCKING (Cap) ×8 IMPLANT
CARTRIDGE OIL MAESTRO DRILL (MISCELLANEOUS) ×1 IMPLANT
CONT SPEC 4OZ CLIKSEAL STRL BL (MISCELLANEOUS) ×2 IMPLANT
COVER BACK TABLE 60X90IN (DRAPES) ×2 IMPLANT
COVER WAND RF STERILE (DRAPES) IMPLANT
DECANTER SPIKE VIAL GLASS SM (MISCELLANEOUS) ×2 IMPLANT
DIFFUSER DRILL AIR PNEUMATIC (MISCELLANEOUS) ×2 IMPLANT
DRAPE C-ARM 42X72 X-RAY (DRAPES) ×4 IMPLANT
DRAPE HALF SHEET 40X57 (DRAPES) ×2 IMPLANT
DRAPE LAPAROTOMY 100X72X124 (DRAPES) ×2 IMPLANT
DRAPE SURG 17X23 STRL (DRAPES) ×8 IMPLANT
DRSG OPSITE POSTOP 4X6 (GAUZE/BANDAGES/DRESSINGS) ×2 IMPLANT
ELECT BLADE 4.0 EZ CLEAN MEGAD (MISCELLANEOUS) ×2
ELECT REM PT RETURN 9FT ADLT (ELECTROSURGICAL) ×2
ELECTRODE BLDE 4.0 EZ CLN MEGD (MISCELLANEOUS) ×1 IMPLANT
ELECTRODE REM PT RTRN 9FT ADLT (ELECTROSURGICAL) ×1 IMPLANT
EVACUATOR 1/8 PVC DRAIN (DRAIN) IMPLANT
GAUZE 4X4 16PLY RFD (DISPOSABLE) IMPLANT
GAUZE SPONGE 4X4 12PLY STRL (GAUZE/BANDAGES/DRESSINGS) IMPLANT
GLOVE BIO SURGEON STRL SZ 6.5 (GLOVE) ×2 IMPLANT
GLOVE BIO SURGEON STRL SZ8 (GLOVE) ×4 IMPLANT
GLOVE BIO SURGEON STRL SZ8.5 (GLOVE) ×4 IMPLANT
GLOVE BIOGEL PI IND STRL 6.5 (GLOVE) IMPLANT
GLOVE BIOGEL PI INDICATOR 6.5 (GLOVE) ×2
GLOVE EXAM NITRILE XL STR (GLOVE) IMPLANT
GLOVE SURG SS PI 6.0 STRL IVOR (GLOVE) ×4 IMPLANT
GLOVE SURG SS PI 7.5 STRL IVOR (GLOVE) ×12 IMPLANT
GOWN STRL REUS W/ TWL LRG LVL3 (GOWN DISPOSABLE) IMPLANT
GOWN STRL REUS W/ TWL XL LVL3 (GOWN DISPOSABLE) ×2 IMPLANT
GOWN STRL REUS W/TWL 2XL LVL3 (GOWN DISPOSABLE) IMPLANT
GOWN STRL REUS W/TWL LRG LVL3 (GOWN DISPOSABLE) ×8
GOWN STRL REUS W/TWL XL LVL3 (GOWN DISPOSABLE) ×4
HEMOSTAT POWDER KIT SURGIFOAM (HEMOSTASIS) ×2 IMPLANT
KIT BASIN OR (CUSTOM PROCEDURE TRAY) ×2 IMPLANT
KIT TURNOVER KIT B (KITS) ×2 IMPLANT
MILL MEDIUM DISP (BLADE) IMPLANT
NEEDLE HYPO 21X1.5 SAFETY (NEEDLE) ×2 IMPLANT
NEEDLE HYPO 22GX1.5 SAFETY (NEEDLE) ×2 IMPLANT
NS IRRIG 1000ML POUR BTL (IV SOLUTION) ×2 IMPLANT
OIL CARTRIDGE MAESTRO DRILL (MISCELLANEOUS) ×2
PACK LAMINECTOMY NEURO (CUSTOM PROCEDURE TRAY) ×2 IMPLANT
PAD ARMBOARD 7.5X6 YLW CONV (MISCELLANEOUS) ×10 IMPLANT
PATTIES SURGICAL .5 X1 (DISPOSABLE) IMPLANT
PUTTY DBM 10CC CALC GRAN (Putty) IMPLANT
ROD REVERE 6.35 45MM (Rod) ×4 IMPLANT
SCREW 7.5X50MM (Screw) ×4 IMPLANT
SPACER ALTERA 10X31-15 (Spacer) ×2 IMPLANT
SPONGE LAP 4X18 RFD (DISPOSABLE) IMPLANT
SPONGE NEURO XRAY DETECT 1X3 (DISPOSABLE) IMPLANT
SPONGE SURGIFOAM ABS GEL 100 (HEMOSTASIS) IMPLANT
STRIP BIOACTIVE 20CC 25X100X8 (Miscellaneous) ×1 IMPLANT
STRIP CLOSURE SKIN 1/2X4 (GAUZE/BANDAGES/DRESSINGS) ×2 IMPLANT
SUT VIC AB 1 CT1 18XBRD ANBCTR (SUTURE) ×2 IMPLANT
SUT VIC AB 1 CT1 8-18 (SUTURE) ×4
SUT VIC AB 2-0 CP2 18 (SUTURE) ×4 IMPLANT
SYR 20CC LL (SYRINGE) ×1 IMPLANT
TOWEL GREEN STERILE (TOWEL DISPOSABLE) ×4 IMPLANT
TOWEL GREEN STERILE FF (TOWEL DISPOSABLE) ×2 IMPLANT
TRAY FOLEY MTR SLVR 16FR STAT (SET/KITS/TRAYS/PACK) ×2 IMPLANT
WATER STERILE IRR 1000ML POUR (IV SOLUTION) ×2 IMPLANT

## 2018-01-17 NOTE — Anesthesia Postprocedure Evaluation (Signed)
Anesthesia Post Note  Patient: DALTON MOLESWORTH  Procedure(s) Performed: POSTERIOR LUMBAR INTERBODY FUSION, INTERBODY PROSTHESIS, POSTERIOR INSTRUMENTATION AND FUSION LUMBAR FOUR- LUMBAR FIVE (N/A Spine Lumbar)     Patient location during evaluation: PACU Anesthesia Type: General Level of consciousness: awake and alert, patient cooperative and oriented Pain management: pain level controlled Vital Signs Assessment: post-procedure vital signs reviewed and stable Respiratory status: spontaneous breathing, nonlabored ventilation and respiratory function stable Cardiovascular status: blood pressure returned to baseline and stable Postop Assessment: no apparent nausea or vomiting Anesthetic complications: no    Last Vitals:  Vitals:   01/17/18 1245 01/17/18 1709  BP: 103/67 101/65  Pulse: 90 97  Resp: 18 16  Temp: (!) 36.3 C 36.6 C  SpO2: 96% 97%    Last Pain:  Vitals:   01/17/18 1709  TempSrc: Oral  PainSc:                  Darian Cansler,E. Dashanique Brownstein

## 2018-01-17 NOTE — Progress Notes (Signed)
Orthopedic Tech Progress Note Patient Details:  Jeffery Wood 1955-09-29 [id number]  Patient ID: Jeffery Wood, male   DOB: 1955-03-23, 62 y.o.   MRN: [id number]   Jeffery Wood [employer] for Lumbar brace. 01/17/2018, 2:16 PM

## 2018-01-17 NOTE — Transfer of Care (Signed)
Immediate Anesthesia Transfer of Care Note  Patient: Jeffery Wood  Procedure(s) Performed: POSTERIOR LUMBAR INTERBODY FUSION, INTERBODY PROSTHESIS, POSTERIOR INSTRUMENTATION AND FUSION LUMBAR FOUR- LUMBAR FIVE (N/A Spine Lumbar)  Patient Location: PACU  Anesthesia Type:General  Level of Consciousness: awake, alert  and oriented  Airway & Oxygen Therapy: Patient Spontanous Breathing and Patient connected to nasal cannula oxygen  Post-op Assessment: Report given to RN, Post -op Vital signs reviewed and stable and Patient moving all extremities X 4  Post vital signs: Reviewed and stable  Last Vitals:  Vitals Value Taken Time  BP 96/57 01/17/2018 11:22 AM  Temp    Pulse 87 01/17/2018 11:25 AM  Resp 19 01/17/2018 11:25 AM  SpO2 95 % 01/17/2018 11:25 AM  Vitals shown include unvalidated device data.  Last Pain:  Vitals:   01/17/18 1121  TempSrc:   PainSc: (P) 0-No pain         Complications: No apparent anesthesia complications

## 2018-01-17 NOTE — Anesthesia Procedure Notes (Signed)
Procedure Name: Intubation Date/Time: 01/17/2018 7:48 AM Performed by: Annye Asa, MD Pre-anesthesia Checklist: Patient identified, Emergency Drugs available, Suction available and Patient being monitored Patient Re-evaluated:Patient Re-evaluated prior to induction Oxygen Delivery Method: Circle system utilized Preoxygenation: Pre-oxygenation with 100% oxygen Induction Type: IV induction Ventilation: Mask ventilation without difficulty and Oral airway inserted - appropriate to patient size Laryngoscope Size: Mac and 4 Grade View: Grade I Tube type: Oral Tube size: 7.5 mm Number of attempts: 1 Airway Equipment and Method: Stylet Placement Confirmation: ETT inserted through vocal cords under direct vision,  positive ETCO2 and breath sounds checked- equal and bilateral Secured at: 21 cm Tube secured with: Tape Dental Injury: Teeth and Oropharynx as per pre-operative assessment

## 2018-01-17 NOTE — Op Note (Signed)
Brief history: The patient is a 62 year old A[place] male who has complained of back and leg pain consistent with neurogenic claudication.  He failed medical management.  He was worked up with a lumbar MRI which demonstrated a lumbar spondylolisthesis, herniated disks severe facet arthropathy and spinal stenosis.  I discussed the various treatment options with the patient.  He has weighed the risk benefits and alternatives surgery and decided proceed with a L4-5 decompression, instrumentation and fusion.  Preoperative diagnosis: L4-5 spondylolisthesis, facet arthropathy, degenerative disc disease, spinal stenosis compressing both the L4 and the L5 nerve roots; lumbago; lumbar radiculopathy  Postoperative diagnosis: The same  Procedure: Bilateral L4-5 laminotomy/foraminotomies/medial facetectomy to decompress the bilateral L4 and L5 nerve roots(the work required to do this was in addition to the work required to do the posterior lumbar interbody fusion because of the patient's spondylolisthesis,  spinal stenosis, facet arthropathy. Etc. requiring a wide decompression of the nerve roots.);  L4-5 transforaminal lumbar interbody fusion with local morselized autograft bone and Kinnex graft extender; insertion of interbody prosthesis at L4-5 (globus peek expandable interbody prosthesis); posterior nonsegmental instrumentation from L4 to L5 with globus titanium pedicle screws and rods; posterior lateral arthrodesis at L4-5 with local morselized autograft bone and Kinnex bone graft extender.  Surgeon: Dr. Earle Gell  Asst.: Arnetha Massy nurse practitioner  Anesthesia: Gen. endotracheal  Estimated blood loss: 200 cc  Drains: None  Complications: None  Description of procedure: The patient was brought to the operating room by the anesthesia team. General endotracheal anesthesia was induced. The patient was turned to the prone position on the Wilson frame. The patient's lumbosacral region was then prepared  with Betadine scrub and Betadine solution. Sterile drapes were applied.  I then injected the area to be incised with Marcaine with epinephrine solution. I then used the scalpel to make a linear midline incision over the L4-5 interspace. I then used electrocautery to perform a bilateral subperiosteal dissection exposing the spinous process and lamina of L4 and L5. We then obtained intraoperative radiograph to confirm our location. We then inserted the Verstrac retractor to provide exposure.  I began the decompression by using the high speed drill to perform laminotomies at L4-5 bilaterally. We then used the Kerrison punches to widen the laminotomy and removed the ligamentum flavum at L4-5 bilaterally. We used the Kerrison punches to remove the medial facets at L4-5 bilaterally. We performed wide foraminotomies about the bilateral L4 and L5 nerve roots completing the decompression.  We now turned our attention to the posterior lumbar interbody fusion. I used a scalpel to incise the intervertebral disc at L4-5 bilaterally. I then performed a partial intervertebral discectomy at L4-5 bilaterally using the pituitary forceps. We prepared the vertebral endplates at I7-1 bilaterally for the fusion by removing the soft tissues with the curettes. We then used the trial spacers to pick the appropriate sized interbody prosthesis. We prefilled his prosthesis with a combination of local morselized autograft bone that we obtained during the decompression as well as Kinnex and Zimmer bone graft extender. We inserted the prefilled prosthesis into the interspace at L4-5 bilaterally, we then turned and expanded the prosthesis. There was a good snug fit of the prosthesis in the interspace. We then filled and the remainder of the intervertebral disc space with local morselized autograft bone and Kinnex. This completed the posterior lumbar interbody arthrodesis.  We now turned attention to the instrumentation. Under fluoroscopic  guidance we cannulated the bilateral L4 and L5 pedicles with the bone probe. We  then removed the bone probe. We then tapped the pedicle with a 6.5 millimeter tap. We then removed the tap. We probed inside the tapped pedicle with a ball probe to rule out cortical breaches. We then inserted a 7.5 x 50 millimeter pedicle screw into the L4 and L5 pedicles bilaterally under fluoroscopic guidance. We then palpated along the medial aspect of the pedicles to rule out cortical breaches. There were none. The nerve roots were not injured. We then connected the unilateral pedicle screws with a lordotic rod. We compressed the construct and secured the rod in place with the caps. We then tightened the caps appropriately. This completed the instrumentation from L4-5 bilaterally.  We now turned our attention to the posterior lateral arthrodesis at L4-5 bilaterally. We used the high-speed drill to decorticate the remainder of the facets, pars, transverse process at L4-5 bilaterally. We then applied a combination of local morselized autograft bone and Kinnex and Zimmer bone graft extender over these decorticated posterior lateral structures. This completed the posterior lateral arthrodesis.  We then obtained hemostasis using bipolar electrocautery. We irrigated the wound out with bacitracin solution. We inspected the thecal sac and nerve roots and noted they were well decompressed. We then removed the retractor. We placed vancomycin powder in the wound.  We injected Exparel . We reapproximated patient's thoracolumbar fascia with interrupted #1 Vicryl suture. We reapproximated patient's subcutaneous tissue with interrupted 2-0 Vicryl suture. The reapproximated patient's skin with Steri-Strips and benzoin. The wound was then coated with bacitracin ointment. A sterile dressing was applied. The drapes were removed. The patient was subsequently returned to the supine position where they were extubated by the anesthesia team. He was  then transported to the post anesthesia care unit in stable condition. All sponge instrument and needle counts were reportedly correct at the end of this case.

## 2018-01-17 NOTE — H&P (Signed)
Subjective: The patient is a 62 year old [place] male with a history of lymphoma who has complained of back and leg pain consistent with neurogenic claudication.  He has failed medical management and was worked up with a lumbar MRI.  This demonstrated an L4-5 spondylolisthesis with spinal stenosis.  I discussed the various treatment options with him.  He has decided to proceed with surgery.  Past Medical History:  Diagnosis Date  . Arthritis   . Asthma   . Basal cell carcinoma (BCC) of right temple region   . Cancer ([employer])    basal cell of right temple; carcinoids  . Complication of anesthesia    hard to wake up after bronchoscopy  . COPD (chronic obstructive pulmonary disease) ([place])   . Diabetes ([place])   . Dyspnea   . GERD (gastroesophageal reflux disease)   . Gout   . Headache   . History of hiatal hernia 05/16/2006   small noted on EGD  . Hypertension   . Lung mass   . 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 Neuromuscular disorder ([place])   . 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  . Sleep apnea    uses mouth piece    Past Surgical History:  Procedure Laterality Date  . BICEPS TENDON REPAIR Left   . CARPAL TUNNEL RELEASE    . COLONOSCOPY    . FINGER SURGERY    . KNEE ARTHROSCOPY     ACL tear  . LEFT HEART CATHETERIZATION WITH CORONARY ANGIOGRAM N/A 12/07/2010   Procedure: LEFT HEART CATHETERIZATION WITH CORONARY ANGIOGRAM;  Surgeon: Leonie Man, MD;  Location: [facility] CATH LAB;  Service: Cardiovascular;  Laterality: N/A;  . PILONIDAL CYST EXCISION    . PORT-A-CATH REMOVAL N/A 11/09/2017   Procedure: REMOVAL PORT-A-CATH;  Surgeon: [name], MD;  Location: [place];  Service: General;  Laterality: N/A;  local  . PORTACATH PLACEMENT Left 02/27/2017   Procedure: INSERTION PORT-A-CATH;  Surgeon: [name], MD;  Location: [facility];  Service: General;  Laterality: Left;  . UPPER GASTROINTESTINAL ENDOSCOPY  05/16/2006  . VIDEO  BRONCHOSCOPY WITH ENDOBRONCHIAL ULTRASOUND N/A 01/24/2017   Procedure: VIDEO BRONCHOSCOPY WITH ENDOBRONCHIAL ULTRASOUND with TRANSBRONCHIAL BIOPSY;  Surgeon: Grace Isaac, MD;  Location: [place];  Service: Thoracic;  Laterality: N/A;  . VIDEO MEDIASTINOSCOPY N/A 02/05/2017   Procedure: VIDEO MEDIASTINOSCOPY;  Surgeon: Grace Isaac, MD;  Location: [place];  Service: Thoracic;  Laterality: N/A;    No Known Allergies  Social History   Tobacco Use  . Smoking status: Former Smoker    Packs/day: 3.00    Years: 20.00    Pack years: 60.00    Types: Cigarettes    Last attempt to quit: 01/31/1988    Years since quitting: 29.9  . Smokeless tobacco: Never Used  Substance Use Topics  . Alcohol use: No    Family History  Problem Relation Age of Onset  . Emphysema Mother   . Asthma Mother   . Heart disease Father    Prior to Admission medications   Medication Sig Start Date End Date Taking? Authorizing Provider  acetaminophen (TYLENOL) 650 MG CR tablet Take 1,300 mg by mouth every 8 (eight) hours as needed for pain.    Yes [provider]  aspirin EC 81 MG tablet Take 81 mg by mouth daily.     Yes [provider]  betamethasone dipropionate (DIPROLENE) 0.05 % cream Apply 1 application  topically daily as needed (for psoriasis).    Yes [provider]  celecoxib (CELEBREX) 200 MG capsule Take 200 mg by mouth 2 (two) times daily. 01/01/17  Yes [provider]  clonazePAM (KLONOPIN) 0.5 MG tablet Take 0.5 mg by mouth at bedtime.     Yes [provider]  glimepiride (AMARYL) 4 MG tablet Take 2 mg by mouth daily with breakfast.    Yes [provider]  loratadine (CLARITIN) 10 MG tablet Take 10 mg by mouth daily.    Yes [provider]  losartan (COZAAR) 100 MG tablet Take 100 mg by mouth at bedtime.  12/26/16  Yes [provider]  metFORMIN (GLUCOPHAGE) 1000 MG tablet TAKE ONE (1) TABLET BY MOUTH TWO (2) TIMES DAILY Patient  taking differently: Take 1,000 mg by mouth 2 (two) times daily.  01/04/18  Yes [name], MD  Multiple Vitamins-Minerals (MULTIVITAMINS THER. W/MINERALS) TABS Take 2 tablets by mouth daily.    Yes [provider]  omeprazole (PRILOSEC) 20 MG capsule Take 20 mg by mouth 2 (two) times daily.     Yes [provider]  tiotropium (SPIRIVA) 18 MCG inhalation capsule Place 18 mcg into inhaler and inhale daily.   Yes [provider]  traMADol (ULTRAM) 50 MG tablet Take 50 mg by mouth at bedtime as needed for moderate pain.    Yes [provider]  albuterol (PROVENTIL) (2.5 MG/3ML) 0.083% nebulizer solution Take 2.5 mg by nebulization at bedtime as needed for wheezing or shortness of breath.    [provider]  etodolac (LODINE) 400 MG tablet Take 400 mg by mouth daily as needed (gout pain).     [provider]  levalbuterol Penne Lash HFA) 45 MCG/ACT inhaler Inhale 2 puffs into the lungs daily as needed for wheezing or shortness of breath.  11/01/16   [provider]     Review of Systems  Positive ROS: As above  All other systems have been reviewed and were otherwise negative with the exception of those mentioned in the HPI and as above.  Objective: Vital signs in last 24 hours: Temp:  [98 F (36.7 C)] 98 F (36.7 C) (12/19 0542) Pulse Rate:  [86] 86 (12/19 0542) Resp:  [20] 20 (12/19 0542) BP: (104)/(69) 104/69 (12/19 0542) SpO2:  [95 %] 95 % (12/19 0542) Estimated body mass index is 31.62 kg/m as calculated from the following:   Height as of 01/10/18: 6\' 3"  (1.905 m).   Weight as of 01/10/18: 114.8 kg.   General Appearance: Alert Head: Normocephalic, without obvious abnormality, atraumatic Eyes: PERRL, conjunctiva/corneas clear, EOM's intact,    Ears: Normal  Throat: Normal  Neck: Supple, Back: unremarkable Lungs: Clear to auscultation bilaterally, respirations unlabored Heart: Regular rate and rhythm, no murmur, rub  or gallop Abdomen: Soft, non-tender Extremities: Extremities normal, atraumatic, no cyanosis or edema Skin: unremarkable  NEUROLOGIC:   Mental status: alert and oriented,Motor Exam - grossly normal Sensory Exam - grossly normal Reflexes:  Coordination - grossly normal Gait - grossly normal Balance - grossly normal Cranial Nerves: I: smell Not tested  II: visual acuity  OS: Normal  OD: Normal   II: visual fields Full to confrontation  II: pupils Equal, round, reactive to light  III,VII: ptosis None  III,IV,VI: extraocular muscles  Full ROM  V: mastication Normal  V: facial light touch sensation  Normal  V,VII: corneal reflex  Present  VII: facial muscle function - upper  Normal  VII: facial muscle  function - lower Normal  VIII: hearing Not tested  IX: soft palate elevation  Normal  IX,X: gag reflex Present  XI: trapezius strength  5/5  XI: sternocleidomastoid strength 5/5  XI: neck flexion strength  5/5  XII: tongue strength  Normal    Data Review Lab Results  Component Value Date   WBC 9.3 01/10/2018   HGB 15.8 01/10/2018   HCT 48.3 01/10/2018   MCV 91.0 01/10/2018   PLT 259 01/10/2018   Lab Results  Component Value Date   NA 137 01/10/2018   K 4.2 01/10/2018   CL 101 01/10/2018   CO2 25 01/10/2018   BUN 13 01/10/2018   CREATININE 0.75 01/10/2018   GLUCOSE 110 (H) 01/10/2018   Lab Results  Component Value Date   INR 1.05 02/05/2017    Assessment/Plan: L4-5 spondylolisthesis, spinal stenosis, lumbago, lumbar radiculopathy, neurogenic claudication: I have discussed the situation with the patient.  I have reviewed his imaging studies with him and pointed out the abnormalities.  We have discussed the various treatment options including surgery.  I have described the surgical treatment option of an L4-5 decompression, instrumentation, and fusion.  I have shown him surgical models.  I have given him a surgical pamphlet.  We have discussed the risks, benefits,  alternatives, expected postoperative course, and likelihood of achieving our goals with surgery.  I have answered all his questions.  He has decided to proceed with surgery.   Ophelia Charter 01/17/2018 7:19 AM

## 2018-01-17 NOTE — Progress Notes (Signed)
Subjective: The patient is somnolent but easily arousable.  He is in no apparent distress.  He looks well.  Objective: Vital signs in last 24 hours: Temp:  [97.7 F (36.5 C)-98 F (36.7 C)] 97.7 F (36.5 C) (12/19 1121) Pulse Rate:  [83-90] 90 (12/19 1215) Resp:  [12-20] 16 (12/19 1215) BP: (96-107)/(57-69) 107/66 (12/19 1200) SpO2:  [92 %-95 %] 94 % (12/19 1215) Estimated body mass index is 31.62 kg/m as calculated from the following:   Height as of 01/10/18: 6\' 3"  (1.905 m).   Weight as of 01/10/18: 114.8 kg.   Intake/Output from previous day: No intake/output data recorded. Intake/Output this shift: Total I/O In: 1370 [P.O.:120; I.V.:1000; IV Piggyback:250] Out: 400 [Urine:200; Blood:200]  Physical exam the patient is somnolent but arousable.  He is moving his lower extremities well.  Lab Results: No results for input(s): WBC, HGB, HCT, PLT in the last 72 hours. BMET No results for input(s): NA, K, CL, CO2, GLUCOSE, BUN, CREATININE, CALCIUM in the last 72 hours.  Studies/Results: Dg Lumbar Spine 2-3 Views  Result Date: 01/17/2018 CLINICAL DATA:  L4-5 posterior fusion EXAM: LUMBAR SPINE - 2-3 VIEW; DG C-ARM 61-120 MIN COMPARISON:  None. FINDINGS: Posterior surgical instruments are directed at the L4-5 level. IMPRESSION: Intraoperative localization as above. Electronically Signed   By: Rolm Baptise M.D.   On: 01/17/2018 11:18   Dg C-arm 1-60 Min  Result Date: 01/17/2018 CLINICAL DATA:  L4-5 posterior fusion EXAM: LUMBAR SPINE - 2-3 VIEW; DG C-ARM 61-120 MIN COMPARISON:  None. FINDINGS: Posterior surgical instruments are directed at the L4-5 level. IMPRESSION: Intraoperative localization as above. Electronically Signed   By: Rolm Baptise M.D.   On: 01/17/2018 11:18    Assessment/Plan: The patient is doing well.  I spoke with his wife and daughter.  LOS: 0 days     Ophelia Charter 01/17/2018, 12:25 PM

## 2018-01-18 LAB — CBC
HCT: 40.7 % (ref 39.0–52.0)
Hemoglobin: 13.3 g/dL (ref 13.0–17.0)
MCH: 29.8 pg (ref 26.0–34.0)
MCHC: 32.7 g/dL (ref 30.0–36.0)
MCV: 91.3 fL (ref 80.0–100.0)
Platelets: 230 10*3/uL (ref 150–400)
RBC: 4.46 MIL/uL (ref 4.22–5.81)
RDW: 13.6 % (ref 11.5–15.5)
WBC: 17.4 10*3/uL — ABNORMAL HIGH (ref 4.0–10.5)
nRBC: 0 % (ref 0.0–0.2)

## 2018-01-18 LAB — BASIC METABOLIC PANEL
ANION GAP: 12 (ref 5–15)
BUN: 14 mg/dL (ref 8–23)
CO2: 24 mmol/L (ref 22–32)
Calcium: 8.8 mg/dL — ABNORMAL LOW (ref 8.9–10.3)
Chloride: 100 mmol/L (ref 98–111)
Creatinine, Ser: 0.96 mg/dL (ref 0.61–1.24)
GFR calc Af Amer: >60 mL/min (ref >60)
GFR calc non Af Amer: >60 mL/min (ref >60)
GLUCOSE: 139 mg/dL — AB (ref 70–99)
Potassium: 4.1 mmol/L (ref 3.5–5.1)
Sodium: 136 mmol/L (ref 135–145)

## 2018-01-18 LAB — GLUCOSE, CAPILLARY: Glucose-Capillary: 131 mg/dL — ABNORMAL HIGH (ref 70–99)

## 2018-01-18 MED ORDER — OXYCODONE HCL 10 MG PO TABS: 10.0000 mg | ORAL_TABLET | ORAL | 0 refills | Status: DC | PRN

## 2018-01-18 MED ORDER — DOCUSATE SODIUM 100 MG PO CAPS: 100.0000 mg | ORAL_CAPSULE | Freq: Two times a day (BID) | ORAL | 0 refills | Status: DC

## 2018-01-18 MED ORDER — CYCLOBENZAPRINE HCL 10 MG PO TABS: 10.0000 mg | ORAL_TABLET | Freq: Three times a day (TID) | ORAL | 1 refills | Status: DC | PRN

## 2018-01-18 NOTE — Discharge Instructions (Signed)
Wound Care °Leave incision open to air. °You may shower. °Do not scrub directly on incision.  °Do not put any creams, lotions, or ointments on incision. °Activity °Walk each and every day, increasing distance each day. °No lifting greater than 5 lbs.  Avoid bending, arching, and twisting. °No driving for 2 weeks; may ride as a passenger locally. °If provided with back brace, wear when out of bed.  It is not necessary to wear in bed. °Diet °Resume your normal diet.  °Return to Work °Will be discussed at you follow up appointment. °Call Your Doctor If Any of These Occur °Redness, drainage, or swelling at the wound.  °Temperature greater than 101 degrees. °Severe pain not relieved by pain medication. °Incision starts to come apart. °Follow Up Appt °Call today for appointment in 1-2 weeks ([phone]) or for problems.  If you have any hardware placed in your spine, you will need an x-ray before your appointment. °

## 2018-01-18 NOTE — Plan of Care (Signed)
  Problem: Bowel/Gastric: Goal: Gastrointestinal status for postoperative course will improve Outcome: Completed/Met   Problem: Clinical Measurements: Goal: Ability to maintain clinical measurements within normal limits will improve Outcome: Completed/Met Goal: Postoperative complications will be avoided or minimized Outcome: Completed/Met Goal: Diagnostic test results will improve Outcome: Completed/Met   Problem: Health Behavior/Discharge Planning: Goal: Identification of resources available to assist in meeting health care needs will improve Outcome: Completed/Met   

## 2018-01-18 NOTE — Evaluation (Signed)
Physical Therapy Evaluation Patient Details Name: Jeffery Wood MRN: [id number] DOB: 1955-02-28 Today's Date: 01/18/2018   History of Present Illness  Pt is a 62 y/o male who presents s/p L4-L5 PLIF on 01/17/18.   Clinical Impression  Pt admitted with above diagnosis. Pt currently with functional limitations due to the deficits listed below (see PT Problem List). At the time of PT eval pt was able to perform transfers and ambulation with gross min guard assist to min assist for balance support and safety with RW. Pt anxious to discharge however appeared to be surprised by the increased level of pain and difficulty transferring that pt experienced during therapy session. Pt will have 24 hour support initially upon return home, and anticipate he will progress well with post-op mobility. Acutely, pt will benefit from skilled PT to increase their independence and safety with mobility to allow discharge to the venue listed below.       Follow Up Recommendations No PT follow up;Supervision for mobility/OOB    Equipment Recommendations  Rolling walker with 5" wheels    Recommendations for Other Services       Precautions / Restrictions Precautions Precautions: Fall Precaution Comments: Issued handout and reviewed with pt and family Required Braces or Orthoses: Spinal Brace Spinal Brace: Lumbar corset;Applied in sitting position Restrictions Weight Bearing Restrictions: No      Mobility  Bed Mobility Overal bed mobility: Needs Assistance Bed Mobility: Rolling;Sidelying to Sit Rolling: Modified independent (Device/Increase time) Sidelying to sit: Supervision       General bed mobility comments: VC's for log roll technique. Pt was able to complete without physical assistance, however close supervision for safety required.   Transfers Overall transfer level: Needs assistance Equipment used: None;Rolling walker (2 wheeled) Transfers: Sit to/from Stand Sit to Stand: Min assist;Min  guard         General transfer comment: Initially min guard assist required without AD. At end of session pt required min assist for power-up to full stand due to increased pain with RW for support.   Ambulation/Gait Ambulation/Gait assistance: Min guard Gait Distance (Feet): 200 Feet Assistive device: Rolling walker (2 wheeled) Gait Pattern/deviations: Step-through pattern;Decreased stride length;Trunk flexed Gait velocity: Decreased Gait velocity interpretation: <1.8 ft/sec, indicate of risk for recurrent falls General Gait Details: VC's for improved posture and general safety. Pt with increased pain initially and was given RW for support. Pt was able to demonstrate smoother gait and upright posture with RW for support, and reports decreased pain.   Stairs Stairs: Yes Stairs assistance: Min assist Stair Management: Step to pattern;Forwards Number of Stairs: 3 General stair comments: Pt used rails for support (has 2 posts he can use initially). Pt's wife and daughter were instructed in assisting pt without rails for the other 2 stairs he has to complete. Pt was unable to complete without rails today but is insistent that he will be able to when he gets home.   Wheelchair Mobility    Modified Rankin (Stroke Patients Only)       Balance Overall balance assessment: Needs assistance Sitting-balance support: Feet supported;No upper extremity supported Sitting balance-Leahy Scale: Fair     Standing balance support: No upper extremity supported Standing balance-Leahy Scale: Poor Standing balance comment: unsteady and reaching for support                             Pertinent Vitals/Pain Pain Assessment: Faces Faces Pain Scale: Hurts whole lot Pain  Location: back radiating down to L leg Pain Descriptors / Indicators: Operative site guarding;Discomfort;Radiating Pain Intervention(s): Limited activity within patient's tolerance;Monitored during session;Repositioned     Home Living Family/patient expects to be discharged to:: Private residence Living Arrangements: Spouse/significant other Available Help at Discharge: Family Type of Home: House Home Access: Stairs to enter Entrance Stairs-Rails: None(posts on both sides) Technical brewer of Steps: 2 Home Layout: One level Home Equipment: None      Prior Function Level of Independence: Independent               Hand Dominance        Extremity/Trunk Assessment        Lower Extremity Assessment Lower Extremity Assessment: LLE deficits/detail LLE Deficits / Details: Decreased strength and muscular endurance consistent with pre-op diagnosis.     Cervical / Trunk Assessment Cervical / Trunk Assessment: Other exceptions Cervical / Trunk Exceptions: s/p surgery  Communication   Communication: No difficulties  Cognition Arousal/Alertness: Awake/alert Behavior During Therapy: WFL for tasks assessed/performed Overall Cognitive Status: Within Functional Limits for tasks assessed                                        General Comments      Exercises     Assessment/Plan    PT Assessment Patient needs continued PT services  PT Problem List Decreased strength;Decreased activity tolerance;Decreased balance;Decreased mobility;Decreased knowledge of use of DME;Decreased safety awareness;Decreased knowledge of precautions;Pain       PT Treatment Interventions DME instruction;Gait training;Stair training;Functional mobility training;Therapeutic activities;Therapeutic exercise;Neuromuscular re-education;Patient/family education    PT Goals (Current goals can be found in the Care Plan section)  Acute Rehab PT Goals Patient Stated Goal: home today PT Goal Formulation: With patient/family Time For Goal Achievement: 01/25/18 Potential to Achieve Goals: Good    Frequency Min 5X/week   Barriers to discharge        Co-evaluation               AM-PAC PT  "6 Clicks" Mobility  Outcome Measure Help needed turning from your back to your side while in a flat bed without using bedrails?: None Help needed moving from lying on your back to sitting on the side of a flat bed without using bedrails?: None Help needed moving to and from a bed to a chair (including a wheelchair)?: A Little Help needed standing up from a chair using your arms (e.g., wheelchair or bedside chair)?: A Little Help needed to walk in hospital room?: A Little Help needed climbing 3-5 steps with a railing? : A Little 6 Click Score: 20    End of Session Equipment Utilized During Treatment: Gait belt;Back brace Activity Tolerance: Patient limited by pain Patient left: in chair;with call bell/phone within reach;with family/visitor present Nurse Communication: Mobility status PT Visit Diagnosis: Pain;Other symptoms and signs involving the nervous system (R29.898) Pain - Right/Left: Left Pain - part of body: Leg    Time: 0829-0904 PT Time Calculation (min) (ACUTE ONLY): 35 min   Charges:   PT Evaluation $PT Eval Moderate Complexity: 1 Mod PT Treatments $Gait Training: 8-22 mins        Rolinda Roan, PT, DPT Acute Rehabilitation Services Pager: [phone] Office: [phone]   Thelma Comp 01/18/2018, 10:04 AM

## 2018-01-18 NOTE — Progress Notes (Signed)
Occupational Therapy Evaluation  Pt presents to OT with the below listed deficits.  He demonstrates difficulty accessing feet for LB ADLs, due to pain, but should progress to this within the next 1-2 weeks.  Wife is very supportive and can assist pt as needed.  Pt, wife, and daughter instructed in use of reacher, back precautions and safety with ADLs and IADLs.  Pt was independent PTA, and is eager to regain independence.  No further OT needed.      01/18/18 1000  OT Visit Information  Last OT Received On 01/18/18  Assistance Needed +1  History of Present Illness Pt is a 62 y/o male who presents s/p L4-L5 PLIF on 01/17/18.  PMH includes stage IV non- Hodgkin's lymphoma   Precautions  Precautions Fall  Precaution Comments Issued handout and reviewed with pt and family  Required Braces or Orthoses Spinal Brace  Spinal Brace Lumbar corset;Applied in sitting position  Restrictions  Weight Bearing Restrictions No  Home Living  Family/patient expects to be discharged to: Private residence  Living Arrangements Spouse/significant other  Available Help at Discharge Family  Type of [place] to enter  Entrance Stairs-Number of Steps 2  Entrance Stairs-Rails None (posts on both sides)  Home Layout One level  Bathroom Shower/Tub Walk-in shower  Home Equipment None  Additional Comments wife and daughter can assist as needed   Prior Function  Level of Independence Independent  Comments Pt underwent chemo in the spring and has had increasing activity tolerance   Communication  Communication No difficulties  Pain Assessment  Pain Assessment 0-10  Faces Pain Scale 8  Pain Location back radiating down to L leg  Pain Descriptors / Indicators Operative site guarding;Discomfort;Radiating  Pain Intervention(s) Monitored during session;Limited activity within patient's tolerance  Cognition  Arousal/Alertness Awake/alert  Behavior During Therapy WFL for tasks  assessed/performed  Overall Cognitive Status Within Functional Limits for tasks assessed  General Comments Pt and wife report that pt has had "chemo brain"   Upper Extremity Assessment  Upper Extremity Assessment Overall WFL for tasks assessed  Lower Extremity Assessment  Lower Extremity Assessment Defer to PT evaluation  Cervical / Trunk Assessment  Cervical / Trunk Assessment Other exceptions  Cervical / Trunk Exceptions s/p surgery  ADL  Overall ADL's  Needs assistance/impaired  Eating/Feeding Independent  Grooming Wash/dry hands;Wash/dry face;Oral care;Brushing hair;Min guard;Standing  Grooming Details (indicate cue type and reason) Pt and family instructed in safe technique   Upper Body Bathing Supervision/ safety;Sitting  Lower Body Bathing Minimal assistance;Sit to/from stand  Lower Body Bathing Details (indicate cue type and reason) Pt not able to fully cross ankles over knees to access feet   Upper Body Dressing  Set up;Sitting;Supervision/safety  Lower Body Dressing Moderate assistance;Sit to/from stand  Lower Body Dressing Details (indicate cue type and reason) Pt able to cross Rt ankle over knee, but not Lt ankle - limited by pain.  Wife will assist, but encouraged pt to practice daily   Toilet Transfer Min guard;Ambulation;Comfort height toilet;Grab bars;RW  Armed forces technical officer Details (indicate cue type and reason) increased time, effort and pt with guarding   ToiletingRunner, broadcasting/film/video and Hygiene Min guard;Sit to/from stand  Tub/Shower Transfer Details (indicate cue type and reason) wife will assist with walk in shower transfers.  discussed use of 3in1 commode   Functional mobility during ADLs Min guard;Rolling walker  General ADL Comments Pain currently limits pt performance.  Family is very supportive   Transfers  Overall transfer  level Needs assistance  Equipment used None;Rolling walker (2 wheeled)  Transfers Sit to/from Stand  Sit to Stand Min guard  General  transfer comment requires increased time and effort due to pain   Balance  Overall balance assessment Needs assistance  Sitting-balance support Feet supported;No upper extremity supported  Sitting balance-Leahy Scale Fair  Standing balance support No upper extremity supported  Standing balance-Leahy Scale Poor  Standing balance comment requires use of UEs to support   General Comments  General comments (skin integrity, edema, etc.) Pt and wife were instructed in use and acquisition of reacher.  Reinforeced precautions, especially lifting precautions, as pt was frustrated by the fact he can't work on his car and put in rebuilt motor.   OT - End of Session  Equipment Utilized During Treatment Rolling walker;Back brace  Activity Tolerance Patient limited by pain  Patient left in chair;with call bell/phone within reach;with family/visitor present  Nurse Communication Mobility status;Patient requests pain meds  OT Assessment  OT Recommendation/Assessment Patient does not need any further OT services  OT Visit Diagnosis Pain  OT Problem List  (back )  AM-PAC OT "6 Clicks" Daily Activity Outcome Measure (Version 2)  Help from another person eating meals? 4  Help from another person taking care of personal grooming? 3  Help from another person toileting, which includes using toliet, bedpan, or urinal? 3  Help from another person bathing (including washing, rinsing, drying)? 2  Help from another person to put on and taking off regular upper body clothing? 4  Help from another person to put on and taking off regular lower body clothing? 2  6 Click Score 18  OT Recommendation  Follow Up Recommendations No OT follow up;Supervision/Assistance - 24 hour  OT Equipment 3 in 1 bedside commode  Acute Rehab OT Goals  Patient Stated Goal to have less pain, and to be able to work on car   OT Goal Formulation All assessment and education complete, DC therapy  OT Time Calculation  OT Start Time (ACUTE  ONLY) [id number]  OT Stop Time (ACUTE ONLY) 0951  OT Time Calculation (min) 30 min  OT General Charges  $OT Visit 1 Visit  OT Evaluation  $OT Eval Low Complexity 1 Low  OT Treatments  $Self Care/Home Management  8-22 mins  Lucille Passy, OTR/L Acute Rehabilitation Services Pager [phone] Office [phone]

## 2018-01-18 NOTE — Discharge Summary (Signed)
Physician Discharge Summary  Patient ID: Jeffery Wood MRN: [id number] DOB/AGE: Aug 19, 1955 62 y.o.  Admit date: 01/17/2018 Discharge date: 01/18/2018  Admission Diagnoses: L4-5 spondylolisthesis, facet arthropathy, lumbago, lumbar radiculopathy, neurogenic claudication  Discharge Diagnoses: The same Active Problems:   Spondylolisthesis of lumbar region   Discharged Condition: good  Hospital Course: I performed an L4-5 decompression, instrumentation and fusion on the patient on 01/17/2018.  The surgery went well.  The patient's postoperative course was unremarkable.  On postoperative day #1 he already felt better and he requested discharge to home.  He was given written and oral discharge instructions.  All his questions were answered.  Consults: Physical therapy Significant Diagnostic Studies: None Treatments: L4-5 decompression, instrumentation, and fusion. Discharge Exam: Blood pressure 124/82, pulse 91, temperature 98.2 F (36.8 C), temperature source Oral, resp. rate 16, SpO2 97 %. The patient is alert and pleasant.  He looks well.  His lower extremity strength is normal.  Disposition: Home   Allergies as of 01/18/2018   No Known Allergies     Medication List    STOP taking these medications   celecoxib 200 MG capsule Commonly known as:  CELEBREX   etodolac 400 MG tablet Commonly known as:  LODINE   traMADol 50 MG tablet Commonly known as:  ULTRAM     TAKE these medications   acetaminophen 650 MG CR tablet Commonly known as:  TYLENOL Take 1,300 mg by mouth every 8 (eight) hours as needed for pain.   albuterol (2.5 MG/3ML) 0.083% nebulizer solution Commonly known as:  PROVENTIL Take 2.5 mg by nebulization at bedtime as needed for wheezing or shortness of breath.   aspirin EC 81 MG tablet Take 81 mg by mouth daily.   betamethasone dipropionate 0.05 % cream Commonly known as:  DIPROLENE Apply 1 application topically daily as needed (for psoriasis).    clonazePAM 0.5 MG tablet Commonly known as:  KLONOPIN Take 0.5 mg by mouth at bedtime.   cyclobenzaprine 10 MG tablet Commonly known as:  FLEXERIL Take 1 tablet (10 mg total) by mouth 3 (three) times daily as needed for muscle spasms.   docusate sodium 100 MG capsule Commonly known as:  COLACE Take 1 capsule (100 mg total) by mouth 2 (two) times daily.   glimepiride 4 MG tablet Commonly known as:  AMARYL Take 2 mg by mouth daily with breakfast.   levalbuterol 45 MCG/ACT inhaler Commonly known as:  XOPENEX HFA Inhale 2 puffs into the lungs daily as needed for wheezing or shortness of breath.   loratadine 10 MG tablet Commonly known as:  CLARITIN Take 10 mg by mouth daily.   losartan 100 MG tablet Commonly known as:  COZAAR Take 100 mg by mouth at bedtime.   metFORMIN 1000 MG tablet Commonly known as:  GLUCOPHAGE TAKE ONE (1) TABLET BY MOUTH TWO (2) TIMES DAILY What changed:  See the new instructions.   multivitamins ther. w/minerals Tabs tablet Take 2 tablets by mouth daily.   omeprazole 20 MG capsule Commonly known as:  PRILOSEC Take 20 mg by mouth 2 (two) times daily.   Oxycodone HCl 10 MG Tabs Take 1 tablet (10 mg total) by mouth every 4 (four) hours as needed for severe pain ((score 7 to 10)).   tiotropium 18 MCG inhalation capsule Commonly known as:  SPIRIVA Place 18 mcg into inhaler and inhale daily.        Signed: Ophelia Charter 01/18/2018, 7:47 AM

## 2018-01-18 NOTE — Progress Notes (Signed)
Discharge instructions, RX's and follow up appts explained and provided to patient and family verbalized understanding. patient was provided with DME ordered by MD before d/c.  Lason Eveland, Tivis Ringer, RN

## 2018-01-22 MED FILL — Sodium Chloride IV Soln 0.9%: INTRAVENOUS | Qty: 1000 | Status: AC

## 2018-01-22 MED FILL — Heparin Sodium (Porcine) Inj 1000 Unit/ML: INTRAMUSCULAR | Qty: 30 | Status: AC

## 2018-02-05 ENCOUNTER — Encounter: Payer: Self-pay | Admitting: Physician Assistant

## 2018-02-05 ENCOUNTER — Ambulatory Visit: Payer: BC Managed Care – PPO | Admitting: Physician Assistant

## 2018-02-05 VITALS — BP 122/70 | HR 93 | Temp 97.7°F | Ht 75.0 in | Wt 253.0 lb

## 2018-02-05 DIAGNOSIS — J069 Acute upper respiratory infection, unspecified: Secondary | ICD-10-CM | POA: Diagnosis not present

## 2018-02-05 DIAGNOSIS — R52 Pain, unspecified: Secondary | ICD-10-CM | POA: Diagnosis not present

## 2018-02-05 LAB — POCT INFLUENZA A/B
INFLUENZA A, POC: NEGATIVE
Influenza B, POC: NEGATIVE

## 2018-02-05 MED ORDER — AZITHROMYCIN 250 MG PO TABS: ORAL_TABLET | ORAL | 0 refills | Status: DC

## 2018-02-05 NOTE — Progress Notes (Signed)
Jeffery Wood is a 63 y.o. male here for a new problem.  History of Present Illness:   Chief Complaint  Patient presents with  . Cough  . Sore Throat    HPI   4 days history of: chills, cough, congestion, sore throat, diarrhea. He is s/p back surgery from last month. Has had subjective fevers. Trying to get his pain under control. Appetite is good. Taking dayquil and nyquil without much relief.   Hx of lymphoma.  Denies: chest pain, SOB, dizziness, swelling in lower legs   Past Medical History:  Diagnosis Date  . Arthritis   . Asthma   . Basal cell carcinoma (BCC) of right temple region   . Cancer ([place])    basal cell of right temple; carcinoids  . Complication of anesthesia    hard to wake up after bronchoscopy  . COPD (chronic obstructive pulmonary disease) ([place])   . Diabetes ([place])   . Dyspnea   . GERD (gastroesophageal reflux disease)   . Gout   . Headache   . History of hiatal hernia 05/16/2006   small noted on EGD  . Hypertension   . Lung mass   . 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[place])    last chemo 05/2017, in remission  . Neuromuscular disorder ([place])   . 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  . Sleep apnea    uses mouth piece     Social History   Socioeconomic History  . Marital status: Married    Spouse name: Not on file  . Number of children: Not on file  . Years of education: Not on file  . Highest education level: Not on file  Occupational History  . Occupation: retired  Scientific laboratory technician  . Financial resource strain: Not on file  . Food insecurity:    Worry: Not on file    Inability: Not on file  . Transportation needs:    Medical: Not on file    Non-medical: Not on file  Tobacco Use  . Smoking status: Former Smoker    Packs/day: 3.00    Years: 20.00    Pack years: 60.00    Types: Cigarettes    Last attempt to quit: 01/31/1988    Years since quitting: 30.0  . Smokeless tobacco: Never Used  Substance and Sexual Activity  . Alcohol use: No  . Drug use:  No  . Sexual activity: Yes    Partners: Female    Birth control/protection: None  Lifestyle  . Physical activity:    Days per week: Not on file    Minutes per session: Not on file  . Stress: Not on file  Relationships  . Social connections:    Talks on phone: Not on file    Gets together: Not on file    Attends religious service: Not on file    Active member of club or organization: Not on file    Attends meetings of clubs or organizations: Not on file    Relationship status: Not on file  . Intimate partner violence:    Fear of current or ex partner: Not on file    Emotionally abused: Not on file    Physically abused: Not on file    Forced sexual activity: Not on file  Other Topics Concern  . Not on file  Social History Narrative  . Not on file    Past Surgical History:  Procedure Laterality Date  . BICEPS TENDON REPAIR Left   .  CARPAL TUNNEL RELEASE    . COLONOSCOPY    . FINGER SURGERY    . KNEE ARTHROSCOPY     ACL tear  . LEFT HEART CATHETERIZATION WITH CORONARY ANGIOGRAM N/A 12/07/2010   Procedure: LEFT HEART CATHETERIZATION WITH CORONARY ANGIOGRAM;  Surgeon: Leonie Man, MD;  Location: [facility] CATH LAB;  Service: Cardiovascular;  Laterality: N/A;  . PILONIDAL CYST EXCISION    . PORT-A-CATH REMOVAL N/A 11/09/2017   Procedure: REMOVAL PORT-A-CATH;  Surgeon: [name], MD;  Location: [place];  Service: General;  Laterality: N/A;  local  . PORTACATH PLACEMENT Left 02/27/2017   Procedure: INSERTION PORT-A-CATH;  Surgeon: [name], MD;  Location: [facility] ORS;  Service: General;  Laterality: Left;  . UPPER GASTROINTESTINAL ENDOSCOPY  05/16/2006  . VIDEO BRONCHOSCOPY WITH ENDOBRONCHIAL ULTRASOUND N/A 01/24/2017   Procedure: VIDEO BRONCHOSCOPY WITH ENDOBRONCHIAL ULTRASOUND with TRANSBRONCHIAL BIOPSY;  Surgeon: Grace Isaac, MD;  Location: [place];  Service: Thoracic;  Laterality: N/A;  . VIDEO MEDIASTINOSCOPY N/A 02/05/2017   Procedure: VIDEO  MEDIASTINOSCOPY;  Surgeon: Grace Isaac, MD;  Location: [facility] OR;  Service: Thoracic;  Laterality: N/A;    Family History  Problem Relation Age of Onset  . Emphysema Mother   . Asthma Mother   . Heart disease Father     No Known Allergies  Current Medications:   Current Outpatient Medications:  .  acetaminophen (TYLENOL) 650 MG CR tablet, Take 1,300 mg by mouth every 8 (eight) hours as needed for pain. , Disp: , Rfl:  .  albuterol (PROVENTIL) (2.5 MG/3ML) 0.083% nebulizer solution, Take 2.5 mg by nebulization at bedtime as needed for wheezing or shortness of breath., Disp: , Rfl:  .  aspirin EC 81 MG tablet, Take 81 mg by mouth daily.  , Disp: , Rfl:  .  betamethasone dipropionate (DIPROLENE) 0.05 % cream, Apply 1 application topically daily as needed (for psoriasis). , Disp: , Rfl:  .  clonazePAM (KLONOPIN) 0.5 MG tablet, Take 0.5 mg by mouth at bedtime.  , Disp: , Rfl:  .  cyclobenzaprine (FLEXERIL) 10 MG tablet, Take 1 tablet (10 mg total) by mouth 3 (three) times daily as needed for muscle spasms., Disp: 50 tablet, Rfl: 1 .  docusate sodium (COLACE) 100 MG capsule, Take 1 capsule (100 mg total) by mouth 2 (two) times daily., Disp: 60 capsule, Rfl: 0 .  glimepiride (AMARYL) 4 MG tablet, Take 2 mg by mouth daily with breakfast. , Disp: , Rfl:  .  HYDROcodone-acetaminophen (NORCO) 10-325 MG tablet, Take 1 tablet by mouth every 6 (six) hours as needed., Disp: , Rfl:  .  levalbuterol (XOPENEX HFA) 45 MCG/ACT inhaler, Inhale 2 puffs into the lungs daily as needed for wheezing or shortness of breath. , Disp: , Rfl:  .  loratadine (CLARITIN) 10 MG tablet, Take 10 mg by mouth daily. , Disp: , Rfl:  .  losartan (COZAAR) 100 MG tablet, Take 100 mg by mouth at bedtime. , Disp: , Rfl:  .  metFORMIN (GLUCOPHAGE) 1000 MG tablet, TAKE ONE (1) TABLET BY MOUTH TWO (2) TIMES DAILY (Patient taking differently: Take 1,000 mg by mouth 2 (two) times daily. ), Disp: 180 tablet, Rfl: PRN .  Multiple  Vitamins-Minerals (MULTIVITAMINS THER. W/MINERALS) TABS, Take 2 tablets by mouth daily. , Disp: , Rfl:  .  omeprazole (PRILOSEC) 20 MG capsule, Take 20 mg by mouth 2 (two) times daily.  , Disp: , Rfl:  .  Pseudoephedrine-APAP-DM (DAYQUIL MULTI-SYMPTOM COLD/FLU PO), Take by  mouth., Disp: , Rfl:  .  tiotropium (SPIRIVA) 18 MCG inhalation capsule, Place 18 mcg into inhaler and inhale daily., Disp: , Rfl:  .  azithromycin (ZITHROMAX) 250 MG tablet, Take two tablets on day 1, then one tablet daily x 4 days, Disp: 6 tablet, Rfl: 0   Review of Systems:   ROS  Negative unless otherwise specified per HPI.   Vitals:   Vitals:   02/05/18 1244  BP: 122/70  Pulse: 93  Temp: 97.7 F (36.5 C)  TempSrc: Oral  SpO2: 97%  Weight: 253 lb (114.8 kg)  Height: 6\' 3"  (1.905 m)     Body mass index is 31.62 kg/m.  Physical Exam:   Physical Exam Vitals signs and nursing note reviewed.  Constitutional:      General: He is not in acute distress.    Appearance: He is well-developed. He is not ill-appearing or toxic-appearing.  HENT:     Head: Normocephalic and atraumatic.     Right Ear: Tympanic membrane, ear canal and external ear normal. Tympanic membrane is not erythematous, retracted or bulging.     Left Ear: Tympanic membrane, ear canal and external ear normal. Tympanic membrane is not erythematous, retracted or bulging.     Nose: Nasal tenderness, mucosal edema and rhinorrhea present.     Right Sinus: No maxillary sinus tenderness or frontal sinus tenderness.     Left Sinus: No maxillary sinus tenderness or frontal sinus tenderness.     Mouth/Throat:     Mouth: Mucous membranes are moist.     Pharynx: Uvula midline. Posterior oropharyngeal erythema present.     Tonsils: No tonsillar exudate. Swelling: 0 on the right. 0 on the left.  Eyes:     General: Lids are normal.     Conjunctiva/sclera: Conjunctivae normal.  Neck:     Trachea: Trachea normal.  Cardiovascular:     Rate and Rhythm:  Normal rate and regular rhythm.     Heart sounds: Normal heart sounds, S1 normal and S2 normal.  Pulmonary:     Effort: Pulmonary effort is normal.     Breath sounds: Normal breath sounds. No decreased breath sounds, wheezing, rhonchi or rales.  Lymphadenopathy:     Cervical: Cervical adenopathy present.  Skin:    General: Skin is warm and dry.  Neurological:     Mental Status: He is alert.  Psychiatric:        Speech: Speech normal.        Behavior: Behavior normal. Behavior is cooperative.    Results for orders placed or performed in visit on 02/05/18  POCT Influenza A/B  Result Value Ref Range   Influenza A, POC Negative Negative   Influenza B, POC Negative Negative    Assessment and Plan:   Luay was seen today for cough and sore throat.  Diagnoses and all orders for this visit:  Body aches -     POCT Influenza A/B  Upper respiratory tract infection, unspecified type  Other orders -     azithromycin (ZITHROMAX) 250 MG tablet; Take two tablets on day 1, then one tablet daily x 4 days   No red flags on exam. Flu test negative. Given recent surgery and history of lymphoma, will initiate oral azithromycin. Discussed taking medications as prescribed. May trial mucinex and/or delsym to help with cough. Reviewed return precautions including worsening fever, SOB, worsening cough or other concerns. Push fluids and rest. I recommend that patient follow-up if symptoms worsen or persist despite treatment x  7-10 days, sooner if needed.  . Reviewed expectations re: course of current medical issues. . Discussed self-management of symptoms. . Outlined signs and symptoms indicating need for more acute intervention. . Patient verbalized understanding and all questions were answered. . See orders for this visit as documented in the electronic medical record. . Patient received an After-Visit Summary.  CMA or LPN served as scribe during this visit. History, Physical, and Plan  performed by medical provider. The above documentation has been reviewed and is accurate and complete.  Inda Coke, PA-C

## 2018-02-05 NOTE — Patient Instructions (Signed)
It was great to see you!  Start azithromycin antibiotic  Try Mucinex and/or Delsym.  Push fluids and get plenty of rest. Please return if you are not improving as expected, or if you have high fevers (>101.5) or difficulty swallowing or worsening productive cough.  Call clinic with questions.  I hope you start feeling better soon!

## 2018-04-09 ENCOUNTER — Other Ambulatory Visit: Payer: Self-pay

## 2018-04-09 ENCOUNTER — Telehealth: Payer: Self-pay | Admitting: Oncology

## 2018-04-09 ENCOUNTER — Inpatient Hospital Stay: Payer: BC Managed Care – PPO | Attending: Oncology | Admitting: Oncology

## 2018-04-09 VITALS — BP 141/92 | HR 79 | Temp 97.8°F | Resp 17 | Ht 75.0 in | Wt 265.5 lb

## 2018-04-09 DIAGNOSIS — R531 Weakness: Secondary | ICD-10-CM | POA: Diagnosis not present

## 2018-04-09 DIAGNOSIS — G8929 Other chronic pain: Secondary | ICD-10-CM | POA: Insufficient documentation

## 2018-04-09 DIAGNOSIS — M79621 Pain in right upper arm: Secondary | ICD-10-CM | POA: Insufficient documentation

## 2018-04-09 DIAGNOSIS — E119 Type 2 diabetes mellitus without complications: Secondary | ICD-10-CM

## 2018-04-09 DIAGNOSIS — C8330 Diffuse large B-cell lymphoma, unspecified site: Secondary | ICD-10-CM | POA: Diagnosis present

## 2018-04-09 DIAGNOSIS — Z87891 Personal history of nicotine dependence: Secondary | ICD-10-CM | POA: Diagnosis not present

## 2018-04-09 DIAGNOSIS — K5903 Drug induced constipation: Secondary | ICD-10-CM | POA: Insufficient documentation

## 2018-04-09 DIAGNOSIS — M545 Low back pain: Secondary | ICD-10-CM | POA: Insufficient documentation

## 2018-04-09 NOTE — Patient Instructions (Signed)
Please provide copy of your Medical Advanced Directive/Living Will at next appointment to have scanned into your medical record.

## 2018-04-09 NOTE — Telephone Encounter (Signed)
Gave avs and calendar ° °

## 2018-04-09 NOTE — Progress Notes (Signed)
Jeffery Wood OFFICE PROGRESS NOTE   Diagnosis: Non-Hodgkin's lymphoma  INTERVAL HISTORY:   Jeffery Wood returns as scheduled.  He underwent lumbar surgery in December 2019.  He reports acute back pain following the surgery.  This has improved.  He has overall back leg pain is better following this procedure.  He continues to have weakness of the left leg.  He recently completed a course of prednisone. No fever.  He has intermittent pain in the right upper arm.  Good appetite.  Objective:  Vital signs in last 24 hours:  Blood pressure (!) 141/92, pulse 79, temperature 97.8 F (36.6 C), temperature source Oral, resp. rate 17, height 6' 3"  (1.905 m), weight 265 lb 8 oz (120.4 kg), SpO2 100 %.    HEENT: Neck without mass Lymphatics: No cervical, supraclavicular, axillary, or inguinal nodes Resp: Coarse end inspiratory rhonchi at the posterior base bilaterally, no respiratory distress Cardio: Regular rate and rhythm GI: No hepatosplenomegaly, nontender, no mass Vascular: No leg edema Musculoskeletal: No swelling or mass at the right arm    Lab Results:   Medications: I have reviewed the patient's current medications.   Assessment/Plan: 1. High-grade B-cell non-Hodgkin's lymphoma, molecular studies pending, clinical stage IV, IPI-low intermediate risk, FISH panel revealed an 8q24/14q84fsion (MYC/IgH), negative for BCL 6 and BCL-2  Chest x-ray 01/15/2017-medial right upper lobe/perihilar mass.   Chest CT 01/19/2017-mass constricting the superior vena cava, subcarinal adenopathy, right suprahilar/mediastinal mass.   12/26/2018status postbronchoscopy with transbronchial biopsy of level 7node and biopsy of aright upper lobe lung mass by Dr. GServando Snare There was compression of the right mainstem bronchus. A mass was noted in the right upper lobe. Mediastinal lymph nodes were seen on EBUS. Multiple biopsies of a level 7 node were obtained. Brushings and a  biopsy of the right upper lobe mass were obtained. 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 Final pathology was nondiagnostic.   PET scan on 02/02/2017 showed hypermetabolism corresponding to the right sided mediastinal mass,progressive since the CT 01/19/2017. Metastatic disease/lymphoma within the bones, pericardium, stomach, bowel and abdominopelvic nodal stations.   Brain MRI 02/03/2017 showed no evidence of intracranial metastases or acute abnormality.   1/7/2019status postmediastinoscopy.  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 Molecular studies are pending.  Rituximab 02/08/2017  Cycle 1 CHOP 02/09/2017  Cycle 1 R-EPOCH 03/02/2017  Cycle2 R-EPOCH2/22/2019  Prophylactic intrathecal methotrexate 03/27/2017  Restaging PET scan 04/11/2017-significant decrease in FDG uptake associated with the chest, abdomen and pelvis and axial and proximal appendicular skeleton. No new foci of abnormal radiotracer uptake identified.  Cycle 3R-EPOCH3/15/2019with intrathecal methotrexate prophylaxis  Cycle 4R-EPOCH4/5/2019with intrathecal methotrexate prophylaxis  Cycle 5 R-EPOCH4/26/2019with intrathecal methotrexate prophylaxis  2. Chest/upper backand right armpain secondary to #1- resolved. 3. Exertional dyspnea secondary to #1 and COPD.Improved. 4. Asthma 5. Diabetes mellitus 6. Gout 7. Remote history of "carcinoid syndrome" 8. Remote history of tobacco use 9. Basal cell carcinoma removed from the right face 10. Chronic low back pain status post epidural steroid injections-followed at a pain management clinic  L4-5 decompression and fusion 01/17/2018 11. 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 Constipation secondary to narcotic analgesics and potentially lymphoma involving the GI tract-improved. 13. Right first toenail infection 06/19/2017- prescribeddoxycycline   Disposition: Mr. WZwahlenis in remission from  non-Hodgkin's lymphoma.  He will return for an office visit in 4 months.  He will continue follow-up with Dr. PJerline Painfor management of diabetes.  He will be sure he has received both  pneumococcal vaccines.  15 minutes were spent with the patient today.  The majority of the time was used for counseling and coordination of care.  Betsy Coder, MD  04/09/2018  8:37 AM

## 2018-04-12 ENCOUNTER — Other Ambulatory Visit: Payer: Self-pay | Admitting: Family Medicine

## 2018-04-16 ENCOUNTER — Other Ambulatory Visit: Payer: Self-pay | Admitting: Family Medicine

## 2018-05-28 MED FILL — SPIRIVA 18 MCG CP-HANDIHALE: 18 | 30 days supply | Qty: 30 | Fill #0

## 2018-05-28 MED FILL — CYCLOBENZAPRINE HCL 10 MG T: 10 | 16 days supply | Qty: 50 | Fill #0

## 2018-05-28 MED FILL — GABAPENTIN 600 MG TABLET: 600 | 30 days supply | Qty: 90 | Fill #0

## 2018-05-30 ENCOUNTER — Ambulatory Visit (INDEPENDENT_AMBULATORY_CARE_PROVIDER_SITE_OTHER): Payer: BC Managed Care – PPO | Admitting: Family Medicine

## 2018-05-30 ENCOUNTER — Encounter: Payer: Self-pay | Admitting: Family Medicine

## 2018-05-30 DIAGNOSIS — R739 Hyperglycemia, unspecified: Secondary | ICD-10-CM | POA: Diagnosis not present

## 2018-05-30 DIAGNOSIS — K219 Gastro-esophageal reflux disease without esophagitis: Secondary | ICD-10-CM

## 2018-05-30 DIAGNOSIS — R42 Dizziness and giddiness: Secondary | ICD-10-CM

## 2018-05-30 DIAGNOSIS — I10 Essential (primary) hypertension: Secondary | ICD-10-CM | POA: Diagnosis not present

## 2018-05-30 DIAGNOSIS — J4530 Mild persistent asthma, uncomplicated: Secondary | ICD-10-CM | POA: Diagnosis not present

## 2018-05-30 MED ORDER — LEVALBUTEROL TARTRATE 45 MCG/ACT IN AERO: 2.0000 | INHALATION_SPRAY | Freq: Every day | RESPIRATORY_TRACT | 1 refills | Status: DC | PRN

## 2018-05-30 MED ORDER — OMEPRAZOLE 20 MG PO CPDR: 20.0000 mg | DELAYED_RELEASE_CAPSULE | Freq: Two times a day (BID) | ORAL | 3 refills | Status: DC

## 2018-05-30 MED FILL — LEVALBUTEROL TAR HFA 45MCG: 45 | 30 days supply | Qty: 15 | Fill #0

## 2018-05-30 MED FILL — OMEPRAZOLE 20 MG CPDR: 20 | 90 days supply | Qty: 180 | Fill #0

## 2018-05-30 NOTE — Assessment & Plan Note (Signed)
Stable.  Will refill Prilosec 20 mg twice daily.

## 2018-05-30 NOTE — Assessment & Plan Note (Signed)
Stable.  Continue Spiriva.  Xopenex refilled.

## 2018-05-30 NOTE — Progress Notes (Signed)
    Chief Complaint:  Jeffery Wood is a 63 y.o. male who presents today for a virtual office visit with a chief complaint of Hyperglycemia follow up.   Assessment/Plan:  Asthma Stable.  Continue Spiriva.  Xopenex refilled.  Hyperglycemia Continue Amaryl 2 mg daily and metformin 1000 mg twice daily.  Does not want to check A1c at this time due to COVID-19 pandemic.  He will follow-up with me in 6 months.  Recheck blood work at that time.  GERD (gastroesophageal reflux disease) Stable.  Will refill Prilosec 20 mg twice daily.  Hypertension Blood pressures been at goal last few times it is been checked.  We will decrease losartan 50 mg daily to see if this helps with his dizziness.  He will follow-up with me in 6 months.  Discussed importance of home blood pressure monitoring with goal 140/90 or lower.  Dizziness No red flags.  Will cut losartan in half to see if this helps.  Encouraged good oral hydration.  Discussed reasons to return to care.    Subjective:  HPI:  His stable, chronic medical conditions are outlined below:  # Hyperglycemia - On amaryl 2mg  once daily and metformin 1000mg  twice daily - Home Blood sugars: 100s - ROS: No reported polyuria or polydipsia  # Hypertension - On losartan 100 mg daily. Occasionally has some dizziness when standing, otherwise tolerating well - Does not check Home BPs - ROS: No reported chest pain or shortness of breath  # GERD - On Prilosec 20 mg twice daily and tolerating well.  # Asthma - On Spiriva and Xopenex as needed.  Doing well. Needs a refill on xopenex.    ROS: Per HPI  PMH: He reports that he quit smoking about 30 years ago. His smoking use included cigarettes. He has a 60.00 pack-year smoking history. He has never used smokeless tobacco. He reports that he does not drink alcohol or use drugs.      Objective/Observations  Physical Exam: Gen: NAD, resting comfortably Pulm: Normal work of breathing Neuro: Grossly  normal, moves all extremities Psych: Normal affect and thought content  Virtual Visit via Video   I connected with Lorenza Burton on 05/30/18 at  8:00 AM EDT by a video enabled telemedicine application and verified that I am speaking with the correct person using two identifiers. I discussed the limitations of evaluation and management by telemedicine and the availability of in person appointments. The patient expressed understanding and agreed to proceed.   Patient location: Home Provider location: [place] participating in the virtual visit: Myself and Patient     Algis Greenhouse. Jerline Pain, MD 05/30/2018 8:55 AM

## 2018-05-30 NOTE — Assessment & Plan Note (Signed)
Blood pressures been at goal last few times it is been checked.  We will decrease losartan 50 mg daily to see if this helps with his dizziness.  He will follow-up with me in 6 months.  Discussed importance of home blood pressure monitoring with goal 140/90 or lower.

## 2018-05-30 NOTE — Assessment & Plan Note (Signed)
Continue Amaryl 2 mg daily and metformin 1000 mg twice daily.  Does not want to check A1c at this time due to COVID-19 pandemic.  He will follow-up with me in 6 months.  Recheck blood work at that time.

## 2018-06-11 MED FILL — LEVALBUTEROL TAR HFA 45MCG: 45 | 30 days supply | Qty: 15 | Fill #0

## 2018-06-11 MED FILL — OMEPRAZOLE 20 MG CPDR: 20 | 90 days supply | Qty: 180 | Fill #0

## 2018-07-15 MED FILL — PREVIDENT 5000 BOOSTER PLUS: 1.1 | 30 days supply | Qty: 100 | Fill #0

## 2018-07-15 MED FILL — clonazePAM 0.5 MG TABS: 0.5 | 90 days supply | Qty: 90 | Fill #0

## 2018-07-25 MED FILL — SPIRIVA 18 MCG CP-HANDIHALE: 18 | 30 days supply | Qty: 30 | Fill #1

## 2018-08-19 ENCOUNTER — Other Ambulatory Visit: Payer: Self-pay | Admitting: Family Medicine

## 2018-09-02 ENCOUNTER — Inpatient Hospital Stay: Payer: BC Managed Care – PPO | Attending: Oncology | Admitting: Oncology

## 2018-09-02 ENCOUNTER — Telehealth: Payer: Self-pay | Admitting: Oncology

## 2018-09-02 ENCOUNTER — Other Ambulatory Visit: Payer: Self-pay

## 2018-09-02 VITALS — BP 124/77 | HR 79 | Temp 98.0°F | Resp 18 | Ht 75.0 in | Wt 256.9 lb

## 2018-09-02 DIAGNOSIS — M109 Gout, unspecified: Secondary | ICD-10-CM | POA: Diagnosis not present

## 2018-09-02 DIAGNOSIS — K5903 Drug induced constipation: Secondary | ICD-10-CM | POA: Insufficient documentation

## 2018-09-02 DIAGNOSIS — Z87891 Personal history of nicotine dependence: Secondary | ICD-10-CM | POA: Insufficient documentation

## 2018-09-02 DIAGNOSIS — G8929 Other chronic pain: Secondary | ICD-10-CM | POA: Insufficient documentation

## 2018-09-02 DIAGNOSIS — Z79899 Other long term (current) drug therapy: Secondary | ICD-10-CM | POA: Diagnosis not present

## 2018-09-02 DIAGNOSIS — M545 Low back pain: Secondary | ICD-10-CM | POA: Insufficient documentation

## 2018-09-02 DIAGNOSIS — C8330 Diffuse large B-cell lymphoma, unspecified site: Secondary | ICD-10-CM

## 2018-09-02 DIAGNOSIS — M25511 Pain in right shoulder: Secondary | ICD-10-CM | POA: Insufficient documentation

## 2018-09-02 DIAGNOSIS — J449 Chronic obstructive pulmonary disease, unspecified: Secondary | ICD-10-CM | POA: Insufficient documentation

## 2018-09-02 DIAGNOSIS — R0609 Other forms of dyspnea: Secondary | ICD-10-CM | POA: Diagnosis not present

## 2018-09-02 DIAGNOSIS — E340 Carcinoid syndrome: Secondary | ICD-10-CM | POA: Diagnosis not present

## 2018-09-02 DIAGNOSIS — C8520 Mediastinal (thymic) large B-cell lymphoma, unspecified site: Secondary | ICD-10-CM | POA: Insufficient documentation

## 2018-09-02 NOTE — Progress Notes (Signed)
  Jeffery Wood OFFICE PROGRESS NOTE   Diagnosis: Non-Hodgkin's lymphoma  INTERVAL HISTORY:   Jeffery Wood returns as scheduled.  Good appetite.  No fever or night sweats.  He continues to have back pain in the evening, but this is much improved compared to presurgery.  He has pain in the right shoulder when lying on his right side.  He is working around the house.  He has noted a decrease in his energy level since completing chemotherapy.  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  Objective:  Vital signs in last 24 hours:  Blood pressure 124/77, pulse 79, temperature 98 F (36.7 C), temperature source Oral, resp. rate 18, height '6\' 3"'$  (1.905 m), weight 256 lb 14.4 oz (116.5 kg), SpO2 97 %.    HEENT: Neck without mass Lymphatics: No cervical, supraclavicular, axillary, or inguinal nodes Resp: Lungs clear bilaterally Cardio: Regular rate and rhythm GI: No hepatosplenomegaly Vascular: No leg edema    Medications: I have reviewed the patient's current medications.   Assessment/Plan: 1. High-grade B-cell non-Hodgkin's lymphoma, molecular studies pending, clinical stage IV, IPI-low intermediate risk, FISH panel revealed an 8q24/14q98fsion (MYC/IgH), negative for BCL 6 and BCL-2  Chest x-ray 01/15/2017-medial right upper lobe/perihilar mass.   Chest CT 01/19/2017-mass constricting the superior vena cava, subcarinal adenopathy, right suprahilar/mediastinal mass.   12/26/2018status postbronchoscopy with transbronchial biopsy of level 7node and biopsy of aright upper lobe lung mass by Dr. GServando Snare There was compression of the right mainstem bronchus. A mass was noted in the right upper lobe. Mediastinal lymph nodes were seen on EBUS. Multiple biopsies of a level 7 node were obtained. Brushings and a biopsy of the right upper lobe mass were obtained. 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 Final pathology was nondiagnostic.    PET scan on 02/02/2017 showed hypermetabolism corresponding to the right sided mediastinal mass,progressive since the CT 01/19/2017. Metastatic disease/lymphoma within the bones, pericardium, stomach, bowel and abdominopelvic nodal stations.   Brain MRI 02/03/2017 showed no evidence of intracranial metastases or acute abnormality.   1/7/2019status postmediastinoscopy.  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 Molecular studies are pending.  Rituximab 02/08/2017  Cycle 1 CHOP 02/09/2017  Cycle 1 R-EPOCH 03/02/2017  Cycle2 R-EPOCH2/22/2019  Prophylactic intrathecal methotrexate 03/27/2017  Restaging PET scan 04/11/2017-significant decrease in FDG uptake associated with the chest, abdomen and pelvis and axial and proximal appendicular skeleton. No new foci of abnormal radiotracer uptake identified.  Cycle 3R-EPOCH3/15/2019with intrathecal methotrexate prophylaxis  Cycle 4R-EPOCH4/5/2019with intrathecal methotrexate prophylaxis  Cycle 5 R-EPOCH4/26/2019with intrathecal methotrexate prophylaxis  2. Chest/upper backand right armpain secondary to #1- resolved. 3. Exertional dyspnea secondary to #1 and COPD.Improved. 4. Asthma 5. Diabetes mellitus 6. Gout 7. Remote history of "carcinoid syndrome" 8. Remote history of tobacco use 9. Basal cell carcinoma removed from the right face 10. Chronic low back pain status post epidural steroid injections-followed at a pain management clinic  L4-5 decompression and fusion 01/17/2018 11. 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 Constipation secondary to narcotic analgesics and potentially lymphoma involving the GI tract-improved. 13. Right first toenail infection 06/19/2017- prescribeddoxycycline     Disposition: Mr. WShattoremains in clinical remission from non-Hodgkin's lymphoma.  He will return for an office visit in 6 months.  He will follow-up with Drs. JArnoldo Moraleand PJerline Painfor  management of chronic pain and neuropathy symptoms.  He [place].  GBetsy Coder MD  09/02/2018  8:32 AM

## 2018-09-02 NOTE — Telephone Encounter (Signed)
Scheduled per los. Mailed printout  °

## 2018-09-06 ENCOUNTER — Other Ambulatory Visit: Payer: Self-pay

## 2018-09-06 MED ORDER — LOSARTAN POTASSIUM 50 MG PO TABS: 50.0000 mg | ORAL_TABLET | Freq: Every day | ORAL | 1 refills | Status: DC

## 2018-09-09 ENCOUNTER — Other Ambulatory Visit: Payer: Self-pay | Admitting: Family Medicine

## 2018-10-11 ENCOUNTER — Telehealth: Payer: Self-pay

## 2018-10-11 ENCOUNTER — Encounter: Payer: Self-pay | Admitting: Family Medicine

## 2018-10-11 ENCOUNTER — Other Ambulatory Visit: Payer: Self-pay

## 2018-10-11 ENCOUNTER — Ambulatory Visit (INDEPENDENT_AMBULATORY_CARE_PROVIDER_SITE_OTHER): Payer: BC Managed Care – PPO | Admitting: Family Medicine

## 2018-10-11 VITALS — BP 130/78 | HR 81 | Temp 97.6°F | Ht 75.0 in | Wt 258.4 lb

## 2018-10-11 DIAGNOSIS — I10 Essential (primary) hypertension: Secondary | ICD-10-CM | POA: Diagnosis not present

## 2018-10-11 DIAGNOSIS — R55 Syncope and collapse: Secondary | ICD-10-CM

## 2018-10-11 DIAGNOSIS — Z23 Encounter for immunization: Secondary | ICD-10-CM | POA: Diagnosis not present

## 2018-10-11 DIAGNOSIS — R739 Hyperglycemia, unspecified: Secondary | ICD-10-CM

## 2018-10-11 DIAGNOSIS — Z6832 Body mass index (BMI) 32.0-32.9, adult: Secondary | ICD-10-CM | POA: Diagnosis not present

## 2018-10-11 DIAGNOSIS — E669 Obesity, unspecified: Secondary | ICD-10-CM

## 2018-10-11 LAB — POCT GLYCOSYLATED HEMOGLOBIN (HGB A1C): Hemoglobin A1C: 5.2 % (ref 4.0–5.6)

## 2018-10-11 NOTE — Assessment & Plan Note (Signed)
At goal off blood pressure medications.  Reportedly had low blood pressure readings at home as well.  We will stop his losartan.  He will follow-up with me in a couple of weeks

## 2018-10-11 NOTE — Assessment & Plan Note (Signed)
A1c 5.2.  Will stop Amaryl.  Continue metformin 1000 mg twice daily.

## 2018-10-11 NOTE — Patient Instructions (Signed)
It was very nice to see you today!  Please stop the glimepiride and losartan.  Check in with me in a couple weeks to let me know how your blood pressures are looking.  Let me know if you have any recurrence of your symptoms.  I will see you soon for your physical.  Take care, Dr Jerline Pain  Please try these tips to maintain a healthy lifestyle:   Eat at least 3 REAL meals and 1-2 snacks per day.  Aim for no more than 5 hours between eating.  If you eat breakfast, please do so within one hour of getting up.    Obtain twice as many fruits/vegetables as protein or carbohydrate foods for both lunch and dinner. (Half of each meal should be fruits/vegetables, one quarter protein, and one quarter starchy carbs)   Cut down on sweet beverages. This includes juice, soda, and sweet tea.    Exercise at least 150 minutes every week.

## 2018-10-11 NOTE — Progress Notes (Signed)
   Chief Complaint:  Jeffery Wood is a 63 y.o. male who presents for same day appointment with a chief complaint of lightheadedness.   Assessment/Plan:  Presyncope Did not lose consciousness - doubt cardiac etiology or arrythmia.  A1c today is 5.2-likely is having hypoglycemic episodes.  He also has low reported home blood pressure readings.  Orthostatic vitals today were normal.  We will stop both his Amaryl and losartan today.  Encouraged good oral hydration.  He will follow-up in 1 to 2 weeks.  Discussed reasons to return to care or seek emergent care.  Hyperglycemia A1c 5.2.  Will stop Amaryl.  Continue metformin 1000 mg twice daily.  Hypertension At goal off blood pressure medications.  Reportedly had low blood pressure readings at home as well.  We will stop his losartan.  He will follow-up with me in a couple of weeks  Body mass index is 32.3 kg/m. / Obesity BMI Metric Follow Up - 10/11/18 1355      BMI Metric Follow Up-Please document annually   BMI Metric Follow Up  Education provided       Preventative Healthcare Flu vaccine and Tdap given today.     Subjective:  HPI:  Lightheadedness, acute problem Patient had an episode this morning in which he felt lightheaded and lowered himself to the floor. He did not lose consciousness.  He had a bit of nausea associated with the event.  No weakness or numbness.  No chest pain.  No palpitations.  No shortness of breath.  Patient was in the middle of cooking breakfast when the event happened.  Had not eaten this morning.  He has been taking his Amaryl at night.  He is also been taking losartan 50 mg daily.  He has noticed over the last several months he has had several low blood pressure readings in the 80s over 50s.  His stable, chronic medical conditions are outlined below:  # Hyperglycemia - On amaryl 2mg  once daily and metformin 1000mg  twice daily - Home Blood sugars: 100s - ROS: No reported polyuria or polydipsia  #  Hypertension - On losartan 50 mg daily and tolerating well - ROS: No reported chest pain or shortness of breath  ROS: Per HPI  PMH: He reports that he quit smoking about 30 years ago. His smoking use included cigarettes. He has a 60.00 pack-year smoking history. He has never used smokeless tobacco. He reports that he does not drink alcohol or use drugs.      Objective:  Physical Exam: BP 130/78   Pulse 81   Temp 97.6 F (36.4 C)   Ht 6\' 3"  (1.905 m)   Wt 258 lb 6.1 oz (117.2 kg)   SpO2 99%   BMI 32.30 kg/m   Gen: NAD, resting comfortably CV: Regular rate and rhythm with no murmurs appreciated Pulm: Normal work of breathing, clear to auscultation bilaterally with no crackles, wheezes, or rhonchi GI: Normal bowel sounds present. Soft, Nontender, Nondistended. MSK: No edema, cyanosis, or clubbing noted Skin: Warm, dry Neuro: Grossly normal, moves all extremities Psych: Normal affect and thought content  Results for orders placed or performed in visit on 10/11/18 (from the past 24 hour(s))  POCT HgB A1C     Status: None   Collection Time: 10/11/18  1:49 PM  Result Value Ref Range   Hemoglobin A1C 5.2 4.0 - 5.6 %        Caleb M. Jerline Pain, MD 10/11/2018 1:57 PM

## 2018-10-11 NOTE — Telephone Encounter (Signed)
error 

## 2018-10-11 NOTE — Telephone Encounter (Signed)
FYI

## 2018-10-14 ENCOUNTER — Other Ambulatory Visit: Payer: Self-pay

## 2018-10-14 ENCOUNTER — Other Ambulatory Visit: Payer: Self-pay | Admitting: Family Medicine

## 2018-10-14 MED ORDER — TAMSULOSIN HCL 0.4 MG PO CAPS: 0.4000 mg | ORAL_CAPSULE | Freq: Every day | ORAL | 3 refills | Status: DC

## 2018-10-14 NOTE — Telephone Encounter (Signed)
Rx request 

## 2018-11-10 ENCOUNTER — Encounter: Payer: Self-pay | Admitting: Oncology

## 2018-11-11 ENCOUNTER — Encounter: Payer: Self-pay | Admitting: *Deleted

## 2018-11-13 ENCOUNTER — Other Ambulatory Visit: Payer: Self-pay | Admitting: Family Medicine

## 2018-11-14 NOTE — Telephone Encounter (Signed)
Rx Request 

## 2018-11-14 NOTE — Telephone Encounter (Signed)
I have not prescribed before - please make sure pt was not supposed to get this from his pain specialist.  Algis Greenhouse. Jerline Pain, MD 11/14/2018 1:01 PM

## 2018-11-28 ENCOUNTER — Encounter: Payer: Self-pay | Admitting: Oncology

## 2018-11-29 ENCOUNTER — Telehealth: Payer: Self-pay | Admitting: *Deleted

## 2018-11-29 NOTE — Telephone Encounter (Signed)
Spoke w/patient regarding the bump at center of chest at bottom of sternum. Reports it came up few days ago. Size of an acorn. Not painful, red or draining. It is very firm to touch. Informed him that Dr. Benay Spice is willing to see him to assess if he wishes. He reports he is seeing his PCP Monday and will have them look at it.

## 2018-12-02 ENCOUNTER — Other Ambulatory Visit: Payer: Self-pay

## 2018-12-02 ENCOUNTER — Encounter: Payer: Self-pay | Admitting: Family Medicine

## 2018-12-02 ENCOUNTER — Ambulatory Visit (INDEPENDENT_AMBULATORY_CARE_PROVIDER_SITE_OTHER): Payer: BC Managed Care – PPO | Admitting: Family Medicine

## 2018-12-02 VITALS — BP 138/82 | HR 82 | Temp 97.8°F | Ht 75.0 in | Wt 251.2 lb

## 2018-12-02 DIAGNOSIS — Z13220 Encounter for screening for lipoid disorders: Secondary | ICD-10-CM | POA: Diagnosis not present

## 2018-12-02 DIAGNOSIS — Z125 Encounter for screening for malignant neoplasm of prostate: Secondary | ICD-10-CM

## 2018-12-02 DIAGNOSIS — M792 Neuralgia and neuritis, unspecified: Secondary | ICD-10-CM | POA: Diagnosis not present

## 2018-12-02 DIAGNOSIS — Z0001 Encounter for general adult medical examination with abnormal findings: Secondary | ICD-10-CM

## 2018-12-02 DIAGNOSIS — I10 Essential (primary) hypertension: Secondary | ICD-10-CM

## 2018-12-02 DIAGNOSIS — M542 Cervicalgia: Secondary | ICD-10-CM

## 2018-12-02 DIAGNOSIS — R29898 Other symptoms and signs involving the musculoskeletal system: Secondary | ICD-10-CM | POA: Diagnosis not present

## 2018-12-02 DIAGNOSIS — C8330 Diffuse large B-cell lymphoma, unspecified site: Secondary | ICD-10-CM

## 2018-12-02 DIAGNOSIS — J4530 Mild persistent asthma, uncomplicated: Secondary | ICD-10-CM

## 2018-12-02 DIAGNOSIS — K219 Gastro-esophageal reflux disease without esophagitis: Secondary | ICD-10-CM

## 2018-12-02 DIAGNOSIS — N400 Enlarged prostate without lower urinary tract symptoms: Secondary | ICD-10-CM

## 2018-12-02 DIAGNOSIS — R739 Hyperglycemia, unspecified: Secondary | ICD-10-CM | POA: Diagnosis not present

## 2018-12-02 LAB — COMPREHENSIVE METABOLIC PANEL
ALT: 13 U/L (ref 0–53)
AST: 15 U/L (ref 0–37)
Albumin: 4.4 g/dL (ref 3.5–5.2)
Alkaline Phosphatase: 79 U/L (ref 39–117)
BUN: 15 mg/dL (ref 6–23)
CO2: 27 mEq/L (ref 19–32)
Calcium: 9.6 mg/dL (ref 8.4–10.5)
Chloride: 98 mEq/L (ref 96–112)
Creatinine, Ser: 0.76 mg/dL (ref 0.40–1.50)
GFR: 103.64 mL/min (ref >60.00)
Glucose, Bld: 150 mg/dL — ABNORMAL HIGH (ref 70–99)
Potassium: 4.6 mEq/L (ref 3.5–5.1)
Sodium: 136 mEq/L (ref 135–145)
Total Bilirubin: 0.7 mg/dL (ref 0.2–1.2)
Total Protein: 6.7 g/dL (ref 6.0–8.3)

## 2018-12-02 LAB — CBC WITH DIFFERENTIAL/PLATELET
Basophils Absolute: 0.1 10*3/uL (ref 0.0–0.1)
Basophils Relative: 1.3 % (ref 0.0–3.0)
Eosinophils Absolute: 0.2 10*3/uL (ref 0.0–0.7)
Eosinophils Relative: 3 % (ref 0.0–5.0)
HCT: 47.5 % (ref 39.0–52.0)
Hemoglobin: 16.1 g/dL (ref 13.0–17.0)
Lymphocytes Relative: 14.8 % (ref 12.0–46.0)
Lymphs Abs: 0.9 10*3/uL (ref 0.7–4.0)
MCHC: 33.9 g/dL (ref 30.0–36.0)
MCV: 88.7 fl (ref 78.0–100.0)
Monocytes Absolute: 0.5 10*3/uL (ref 0.1–1.0)
Monocytes Relative: 9.3 % (ref 3.0–12.0)
Neutro Abs: 4.2 10*3/uL (ref 1.4–7.7)
Neutrophils Relative %: 71.6 % (ref 43.0–77.0)
Platelets: 258 10*3/uL (ref 150.0–400.0)
RBC: 5.36 Mil/uL (ref 4.22–5.81)
RDW: 13.6 % (ref 11.5–15.5)
WBC: 5.9 10*3/uL (ref 4.0–10.5)

## 2018-12-02 LAB — LIPID PANEL
Cholesterol: 188 mg/dL (ref 0–200)
HDL: 36.3 mg/dL — ABNORMAL LOW (ref >39.00)
LDL Cholesterol: 132 mg/dL — ABNORMAL HIGH (ref 0–99)
NonHDL: 151.21
Total CHOL/HDL Ratio: 5
Triglycerides: 95 mg/dL (ref 0.0–149.0)
VLDL: 19 mg/dL (ref 0.0–40.0)

## 2018-12-02 LAB — TSH: TSH: 0.92 u[IU]/mL (ref 0.35–4.50)

## 2018-12-02 LAB — PSA: PSA: 0.41 ng/mL (ref 0.10–4.00)

## 2018-12-02 LAB — HEMOGLOBIN A1C: Hgb A1c MFr Bld: 6.2 % (ref 4.6–6.5)

## 2018-12-02 MED ORDER — CYCLOBENZAPRINE HCL 10 MG PO TABS: 10.0000 mg | ORAL_TABLET | Freq: Three times a day (TID) | ORAL | 0 refills | Status: DC | PRN

## 2018-12-02 MED ORDER — DICLOFENAC SODIUM 75 MG PO TBEC: 75.0000 mg | DELAYED_RELEASE_TABLET | Freq: Two times a day (BID) | ORAL | 0 refills | Status: DC

## 2018-12-02 MED ORDER — DULOXETINE HCL 30 MG PO CPEP: 30.0000 mg | ORAL_CAPSULE | Freq: Every day | ORAL | 3 refills | Status: DC

## 2018-12-02 NOTE — Progress Notes (Signed)
Chief Complaint:  Jeffery Wood is a 63 y.o. male who presents today for his annual comprehensive physical exam.    Assessment/Plan:  Hyperglycemia Continue Amaryl 2 mg once daily Metformin 1000 mg twice daily.  Check A1c.  GERD (gastroesophageal reflux disease) Continue omeprazole 20 mg twice daily.  BPH (benign prostatic hyperplasia) Continue Flomax 0.4 mg daily.  Asthma Stable.  Continue Spiriva.  Hypertension At goal.  Continue losartan 50 mg daily.  Check CBC, C met, TSH.  Neuropathic pain Continue gabapentin 600 mg 3 times daily.  Also start Cymbalta 30 mg daily at bedtime.  Discussed potential side effects.  NHL (non-Hodgkin's lymphoma) ([place]) Continue management per oncology.  Check CBC with differential today.  Neck Pain Consistent with muscular strain.  No red flags.  Start diclofenac and Flexeril.  Discussed home exercises and handout was given.  Discussed reasons return to care.  Xiphoid prominence  No red flags.  Will continue with watchful waiting.  Will check ultrasound if symptoms change.  Preventative Healthcare: Check PSA and lipid panel.  Up-to-date on colon cancer screening.  Patient Counseling(The following topics were reviewed and/or handout was given):  -Nutrition: Stressed importance of moderation in sodium/caffeine intake, saturated fat and cholesterol, caloric balance, sufficient intake of fresh fruits, vegetables, and fiber.  -Stressed the importance of regular exercise.   -Substance Abuse: Discussed cessation/primary prevention of tobacco, alcohol, or other drug use; driving or other dangerous activities under the influence; availability of treatment for abuse.   -Injury prevention: Discussed safety belts, safety helmets, smoke detector, smoking near bedding or upholstery.   -Sexuality: Discussed sexually transmitted diseases, partner selection, use of condoms, avoidance of unintended pregnancy and contraceptive alternatives.   -Dental health:  Discussed importance of regular tooth brushing, flossing, and dental visits.  -Health maintenance and immunizations reviewed. Please refer to Health maintenance section.  Return to care in 1 year for next preventative visit.     Subjective:  HPI:  He has no acute complaints today.   Neck Pain Started several weeks ago.  Located right side of neck.  Seems to be stable.  They are all the time.  Worse with certain movements.  Better with Tylenol.  No weakness or numbness.  No other treatments tried.  No obvious aggravating or alleviating factors.  He is also noticed a lump on his lower sternum.  Is been there for a few weeks.  He is not sure if it is more noticeable now that he has lost weight.  No pain.  His stable, chronic medical conditions are outlined below:   # Hyperglycemia - On amaryl 31m once daily and metformin 10078mtwice daily - ROS: No reported polyuria or polydipsia  # Hypertension - On losartan 50 mg daily and tolerating well - ROS: No reported chest pain or shortness of breath  # GERD - On Prilosec 20 mg twice daily and tolerating well.  # Asthma - On Spiriva and Xopenex as needed.    # BPH - On flomax 0.2m55mnce daily and tolerating well  # Psoriasis - Uses betamethasone ointment as needed.  % Chronic low back pain/arthritis - Follows with sees rheumatology, neurosurgery, and pain management for this.  - Takes tramadol as needed.   % Non-Hodgkin's lymphoma - Follows with oncology  Lifestyle Diet: No specific diets or eating plans.  Exercise: Very busy with work around at home.   Depression screen PHQ 2/9 12/02/2018  Decreased Interest 0  Down, Depressed, Hopeless 0  PHQ - 2  Score 0  Altered sleeping 3  Tired, decreased energy 1  Change in appetite 0  Feeling bad or failure about yourself  0  Trouble concentrating 0  Moving slowly or fidgety/restless 0  Suicidal thoughts 0  PHQ-9 Score 4  Difficult doing work/chores Somewhat difficult     Health Maintenance Due  Topic Date Due  . URINE MICROALBUMIN  01/29/1966     ROS: Per HPI, otherwise a complete review of systems was negative.   PMH:  The following were reviewed and entered/updated in epic: Past Medical History:  Diagnosis Date  . Arthritis   . Asthma   . Basal cell carcinoma (BCC) of right temple region   . Cancer ([place])    basal cell of right temple; carcinoids  . Complication of anesthesia    hard to wake up after bronchoscopy  . COPD (chronic obstructive pulmonary disease) ([place])   . Diabetes ([place])   . Dyspnea   . GERD (gastroesophageal reflux disease)   . Gout   . Headache   . History of hiatal hernia 05/16/2006   small noted on EGD  . Hypertension   . Lung mass   . 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[place])    last chemo 05/2017, in remission  . Neuromuscular disorder ([place])   . 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  . Sleep apnea    uses mouth piece   Patient Active Problem List   Diagnosis Date Noted  . Spondylolisthesis of lumbar region 01/17/2018  . Hypertension 11/29/2017  . Asthma 11/29/2017  . BPH (benign prostatic hyperplasia) 11/29/2017  . Chronic low back pain 11/29/2017  . Osteoarthritis 11/29/2017  . Psoriasis 11/29/2017  . GERD (gastroesophageal reflux disease) 11/29/2017  . Neuropathic pain 04/14/2017  . Constipation 03/04/2017  . Admission for antineoplastic chemotherapy   . Encounter for antineoplastic immunotherapy   . Lymphoma, high grade ([place]) 03/02/2017  . NHL (non-Hodgkin's lymphoma) ([place]) 02/07/2017  . OSA (obstructive sleep apnea) 07/22/2013  . Chest pain, non-cardiac 12/07/2010  . Hyperglycemia 12/07/2010   Past Surgical History:  Procedure Laterality Date  . BICEPS TENDON REPAIR Left   . CARPAL TUNNEL RELEASE    . COLONOSCOPY    . FINGER SURGERY    . KNEE ARTHROSCOPY     ACL tear  . LEFT HEART CATHETERIZATION WITH CORONARY ANGIOGRAM N/A 12/07/2010   Procedure: LEFT HEART CATHETERIZATION WITH CORONARY ANGIOGRAM;   Surgeon: Leonie Man, MD;  Location: [facility] CATH LAB;  Service: Cardiovascular;  Laterality: N/A;  . PILONIDAL CYST EXCISION    . PORT-A-CATH REMOVAL N/A 11/09/2017   Procedure: REMOVAL PORT-A-CATH;  Surgeon: [name], MD;  Location: [place];  Service: General;  Laterality: N/A;  local  . PORTACATH PLACEMENT Left 02/27/2017   Procedure: INSERTION PORT-A-CATH;  Surgeon: [name], MD;  Location: [facility] ORS;  Service: General;  Laterality: Left;  . UPPER GASTROINTESTINAL ENDOSCOPY  05/16/2006  . VIDEO BRONCHOSCOPY WITH ENDOBRONCHIAL ULTRASOUND N/A 01/24/2017   Procedure: VIDEO BRONCHOSCOPY WITH ENDOBRONCHIAL ULTRASOUND with TRANSBRONCHIAL BIOPSY;  Surgeon: Grace Isaac, MD;  Location: [facility];  Service: Thoracic;  Laterality: N/A;  . VIDEO MEDIASTINOSCOPY N/A 02/05/2017   Procedure: VIDEO MEDIASTINOSCOPY;  Surgeon: Grace Isaac, MD;  Location: [facility] OR;  Service: Thoracic;  Laterality: N/A;    Family History  Problem Relation Age of Onset  . Emphysema Mother   . Asthma Mother   . Heart disease Father     Medications- reviewed and updated Current Outpatient  Medications  Medication Sig Dispense Refill  . aspirin EC 81 MG tablet Take 81 mg by mouth daily.      . betamethasone dipropionate (DIPROLENE) 0.05 % cream Apply 1 application topically daily as needed (for psoriasis).     . celecoxib (CELEBREX) 200 MG capsule Take 200 mg by mouth 2 (two) times daily.    . clonazePAM (KLONOPIN) 0.5 MG tablet TAKE ONE TABLET BY MOUTH EVERY NIGHT AT BEDTIME 90 tablet 0  . gabapentin (NEURONTIN) 600 MG tablet TAKE 1 TABLET BY MOUTH 3 TIMES EVERY DAY 90 tablet 0  . levalbuterol (XOPENEX HFA) 45 MCG/ACT inhaler Inhale 2 puffs into the lungs daily as needed for wheezing or shortness of breath. 1 Inhaler 1  . loratadine (CLARITIN) 10 MG tablet Take 10 mg by mouth daily.     . metFORMIN (GLUCOPHAGE) 1000 MG tablet TAKE ONE (1) TABLET BY MOUTH TWO (2) TIMES DAILY (Patient taking  differently: Take 1,000 mg by mouth 2 (two) times daily. ) 180 tablet PRN  . omeprazole (PRILOSEC) 20 MG capsule Take 1 capsule (20 mg total) by mouth 2 (two) times daily. 180 capsule 3  . tamsulosin (FLOMAX) 0.4 MG CAPS capsule Take 1 capsule (0.4 mg total) by mouth daily. 30 capsule 3  . tiotropium (SPIRIVA) 18 MCG inhalation capsule Place 18 mcg into inhaler and inhale daily.    . traMADol (ULTRAM) 50 MG tablet Take 50 mg by mouth every 6 (six) hours as needed. Takes at bedtime    . cyclobenzaprine (FLEXERIL) 10 MG tablet Take 1 tablet (10 mg total) by mouth 3 (three) times daily as needed for muscle spasms. 30 tablet 0  . diclofenac (VOLTAREN) 75 MG EC tablet Take 1 tablet (75 mg total) by mouth 2 (two) times daily. 30 tablet 0  . DULoxetine (CYMBALTA) 30 MG capsule Take 1 capsule (30 mg total) by mouth at bedtime. 30 capsule 3   No current facility-administered medications for this visit.     Allergies-reviewed and updated No Known Allergies  Social History   Socioeconomic History  . Marital status: Married    Spouse name: Not on file  . Number of children: Not on file  . Years of education: Not on file  . Highest education level: Not on file  Occupational History  . Occupation: retired  Scientific laboratory technician  . Financial resource strain: Not on file  . Food insecurity    Worry: Not on file    Inability: Not on file  . Transportation needs    Medical: Not on file    Non-medical: Not on file  Tobacco Use  . Smoking status: Former Smoker    Packs/day: 3.00    Years: 20.00    Pack years: 60.00    Types: Cigarettes    Quit date: 01/31/1988    Years since quitting: 30.8  . Smokeless tobacco: Never Used  Substance and Sexual Activity  . Alcohol use: No  . Drug use: No  . Sexual activity: Yes    Partners: Female    Birth control/protection: None  Lifestyle  . Physical activity    Days per week: Not on file    Minutes per session: Not on file  . Stress: Not on file   Relationships  . Social Herbalist on phone: Not on file    Gets together: Not on file    Attends religious service: Not on file    Active member of club or organization: Not on file  Attends meetings of clubs or organizations: Not on file    Relationship status: Not on file  Other Topics Concern  . Not on file  Social History Narrative  . Not on file        Objective:  Physical Exam: BP 138/82   Pulse 82   Temp 97.8 F (36.6 C)   Ht 6' 3"  (1.905 m)   Wt 251 lb 3.2 oz (113.9 kg)   SpO2 97%   BMI 31.40 kg/m   Body mass index is 31.4 kg/m. Wt Readings from Last 3 Encounters:  12/02/18 251 lb 3.2 oz (113.9 kg)  10/11/18 258 lb 6.1 oz (117.2 kg)  09/02/18 256 lb 14.4 oz (116.5 kg)   Gen: NAD, resting comfortably HEENT: TMs normal bilaterally. OP clear. No thyromegaly noted.  CV: RRR with no murmurs appreciated Pulm: NWOB, CTAB with no crackles, wheezes, or rhonchi GI: Normal bowel sounds present. Soft, Nontender, Nondistended. MSK: no edema, cyanosis, or clubbing noted -Neck: No deformities.  Tender to palpation along right paraspinal muscles. -Upper extremities: No deformities.  Full range of motion.  Strength 5 out of 5 throughout -Chest: Mild prominence noted at distal xiphoid process nontender to palpation.  Nonmobile. Skin: warm, dry Neuro: CN2-12 grossly intact. Strength 5/5 in upper and lower extremities. Reflexes symmetric and intact bilaterally.  Psych: Normal affect and thought content     Caleb M. Jerline Pain, MD 12/02/2018 9:00 AM

## 2018-12-02 NOTE — Assessment & Plan Note (Signed)
Continue Amaryl 2 mg once daily Metformin 1000 mg twice daily.  Check A1c.

## 2018-12-02 NOTE — Assessment & Plan Note (Signed)
Stable.  Continue Spiriva. 

## 2018-12-02 NOTE — Assessment & Plan Note (Signed)
Continue omeprazole 20 mg twice daily. 

## 2018-12-02 NOTE — Assessment & Plan Note (Signed)
Continue Flomax 0.4 mg daily

## 2018-12-02 NOTE — Assessment & Plan Note (Signed)
Continue management per oncology.  Check CBC with differential today.

## 2018-12-02 NOTE — Patient Instructions (Signed)
It was very nice to see you today!  Please try the Voltaren and Flexeril.  Please work on the stretches and exercises for your neck.  Please stop the Cymbalta.  No other medication changes today.  Keep an eye on the spot on your abdomen and let me know if it changes in any way.  We will check blood work today.  Come back to see me in 1 year for your next physical or sooner if needed.  Take care, Dr Jerline Pain  Please try these tips to maintain a healthy lifestyle:   Eat at least 3 REAL meals and 1-2 snacks per day.  Aim for no more than 5 hours between eating.  If you eat breakfast, please do so within one hour of getting up.    Obtain twice as many fruits/vegetables as protein or carbohydrate foods for both lunch and dinner. (Half of each meal should be fruits/vegetables, one quarter protein, and one quarter starchy carbs)   Cut down on sweet beverages. This includes juice, soda, and sweet tea.    Exercise at least 150 minutes every week.    Preventive Care 63-51 Years Old, Male Preventive care refers to lifestyle choices and visits with your health care provider that can promote health and wellness. This includes:  A yearly physical exam. This is also called an annual well check.  Regular dental and eye exams.  Immunizations.  Screening for certain conditions.  Healthy lifestyle choices, such as eating a healthy diet, getting regular exercise, not using drugs or products that contain nicotine and tobacco, and limiting alcohol use. What can I expect for my preventive care visit? Physical exam Your health care provider will check:  Height and weight. These may be used to calculate body mass index (BMI), which is a measurement that tells if you are at a healthy weight.  Heart rate and blood pressure.  Your skin for abnormal spots. Counseling Your health care provider may ask you questions about:  Alcohol, tobacco, and drug use.  Emotional well-being.  Home and  relationship well-being.  Sexual activity.  Eating habits.  Work and work Statistician. What immunizations do I need?  Influenza (flu) vaccine  This is recommended every year. Tetanus, diphtheria, and pertussis (Tdap) vaccine  You may need a Td booster every 10 years. Varicella (chickenpox) vaccine  You may need this vaccine if you have not already been vaccinated. Zoster (shingles) vaccine  You may need this after age 63. Measles, mumps, and rubella (MMR) vaccine  You may need at least one dose of MMR if you were born in 1957 or later. You may also need a second dose. Pneumococcal conjugate (PCV13) vaccine  You may need this if you have certain conditions and were not previously vaccinated. Pneumococcal polysaccharide (PPSV23) vaccine  You may need one or two doses if you smoke cigarettes or if you have certain conditions. Meningococcal conjugate (MenACWY) vaccine  You may need this if you have certain conditions. Hepatitis A vaccine  You may need this if you have certain conditions or if you travel or work in places where you may be exposed to hepatitis A. Hepatitis B vaccine  You may need this if you have certain conditions or if you travel or work in places where you may be exposed to hepatitis B. Haemophilus influenzae type b (Hib) vaccine  You may need this if you have certain risk factors. Human papillomavirus (HPV) vaccine  If recommended by your health care provider, you may need three  doses over 6 months. You may receive vaccines as individual doses or as more than one vaccine together in one shot (combination vaccines). Talk with your health care provider about the risks and benefits of combination vaccines. What tests do I need? Blood tests  Lipid and cholesterol levels. These may be checked every 5 years, or more frequently if you are over 28 years old.  Hepatitis C test.  Hepatitis B test. Screening  Lung cancer screening. You may have this  screening every year starting at age 63 if you have a 30-pack-year history of smoking and currently smoke or have quit within the past 15 years.  Prostate cancer screening. Recommendations will vary depending on your family history and other risks.  Colorectal cancer screening. All adults should have this screening starting at age 63 and continuing until age 110. Your health care provider may recommend screening at age 66 if you are at increased risk. You will have tests every 1-10 years, depending on your results and the type of screening test.  Diabetes screening. This is done by checking your blood sugar (glucose) after you have not eaten for a while (fasting). You may have this done every 1-3 years.  Sexually transmitted disease (STD) testing. Follow these instructions at home: Eating and drinking  Eat a diet that includes fresh fruits and vegetables, whole grains, lean protein, and low-fat dairy products.  Take vitamin and mineral supplements as recommended by your health care provider.  Do not drink alcohol if your health care provider tells you not to drink.  If you drink alcohol: ? Limit how much you have to 0-2 drinks a day. ? Be aware of how much alcohol is in your drink. In the [place] one drink equals one 12 oz bottle of beer (355 mL), one 5 oz glass of wine (148 mL), or one 1 oz glass of hard liquor (44 mL). Lifestyle  Take daily care of your teeth and gums.  Stay active. Exercise for at least 30 minutes on 5 or more days each week.  Do not use any products that contain nicotine or tobacco, such as cigarettes, e-cigarettes, and chewing tobacco. If you need help quitting, ask your health care provider.  If you are sexually active, practice safe sex. Use a condom or other form of protection to prevent STIs (sexually transmitted infections).  Talk with your health care provider about taking a low-dose aspirin every day starting at age 63. What's next?  Go to your health care  provider once a year for a well check visit.  Ask your health care provider how often you should have your eyes and teeth checked.  Stay up to date on all vaccines. This information is not intended to replace advice given to you by your health care provider. Make sure you discuss any questions you have with your health care provider. Document Released: 02/12/2015 Document Revised: 01/10/2018 Document Reviewed: 01/10/2018 Elsevier Patient Education  2020 [employer].

## 2018-12-02 NOTE — Assessment & Plan Note (Signed)
At goal.  Continue losartan 50 mg daily.  Check CBC, C met, TSH.

## 2018-12-02 NOTE — Assessment & Plan Note (Addendum)
Continue gabapentin 600 mg 3 times daily.  Also start Cymbalta 30 mg daily at bedtime.  Discussed potential side effects.

## 2018-12-03 NOTE — Progress Notes (Signed)
Please inform patient of the following:  Blood sugar is up slightly compared to last time but still at goal.  All of his other labs are NORMAL. Would like for him to continue all of his current medications and we can recheck his blood sugar in 6 months.  Jeffery Wood. Jerline Pain, MD 12/03/2018 12:47 PM

## 2018-12-11 ENCOUNTER — Other Ambulatory Visit: Payer: Self-pay | Admitting: Family Medicine

## 2019-01-02 ENCOUNTER — Other Ambulatory Visit: Payer: Self-pay | Admitting: Family Medicine

## 2019-01-02 NOTE — Telephone Encounter (Signed)
Rx Request 

## 2019-01-09 LAB — HM DIABETES EYE EXAM

## 2019-01-15 ENCOUNTER — Encounter: Payer: Self-pay | Admitting: Family Medicine

## 2019-01-16 ENCOUNTER — Other Ambulatory Visit: Payer: Self-pay | Admitting: Family Medicine

## 2019-02-06 IMAGING — MR MR HEAD WO/W CM
10 series · 48 of 48 positions shown · IV contrast (Multihance 20ml)
Comparison: 02/20/2011 brain MRI.  02/27/2011 neck CTA.

CLINICAL DATA: Right-sided mediastinal mass concerning for
malignancy with evidence of metastatic disease on PET-CT. Staging.

EXAM:
MRI HEAD WITHOUT AND WITH CONTRAST
TECHNIQUE: Multiplanar, multiecho pulse sequences of the brain and surrounding
structures were obtained without and with intravenous contrast.
CONTRAST:  20mL MULTIHANCE GADOBENATE DIMEGLUMINE 529 MG/ML IV SOLN

[Series 5: T1 · sagittal · 4.0mm · 0.75mm/px · 3 of 31 slices shown (1 of 3)]
[im 1/31]
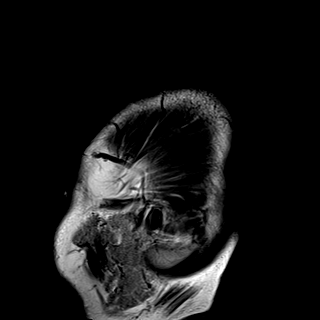
[im 16/31]
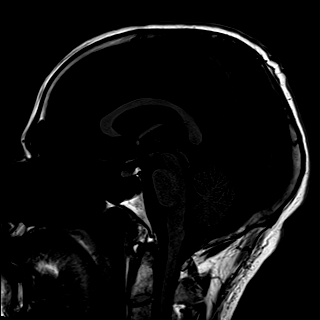
[im 31/31]
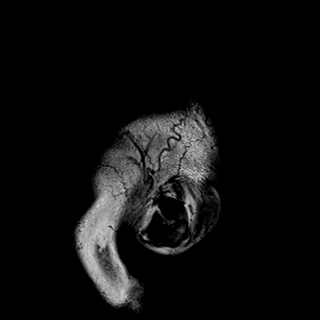

[Series 6: DWI · axial · 3.0mm · 1.44mm/px · z∈[-77,+77]mm · 6 of 96 slices shown (1 of 2)]
[im 1/96]
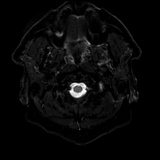
[im 20/96]
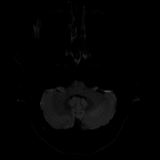
[im 39/96]
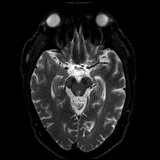
[im 58/96]
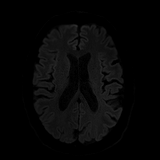
[im 77/96]
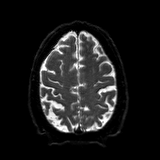
[im 96/96]
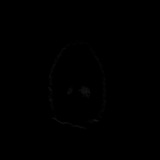

[Series 7: DWI · axial · 3.0mm · 1.44mm/px · z∈[-77,+77]mm · 3 of 48 slices shown (2 of 2)]
[im 1/48]
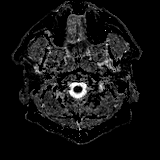
[im 24/48]
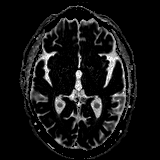
[im 48/48]
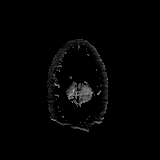

[Series 8: T2 · axial · 4.0mm · 0.36mm/px · z∈[-75,+75]mm · 2 of 30 slices shown]
[im 1/30]
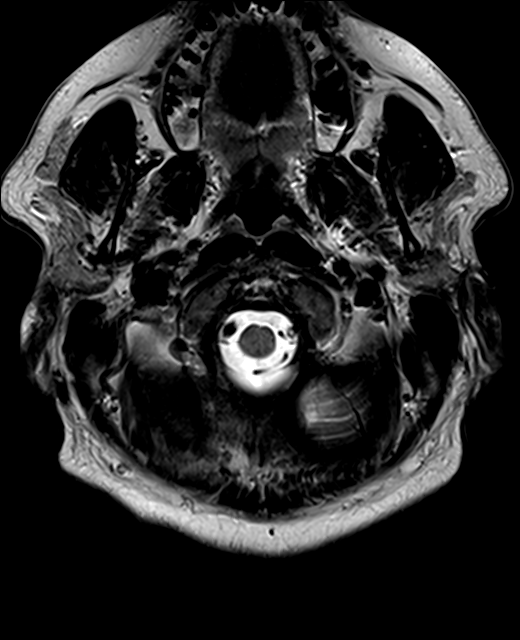
[im 30/30]
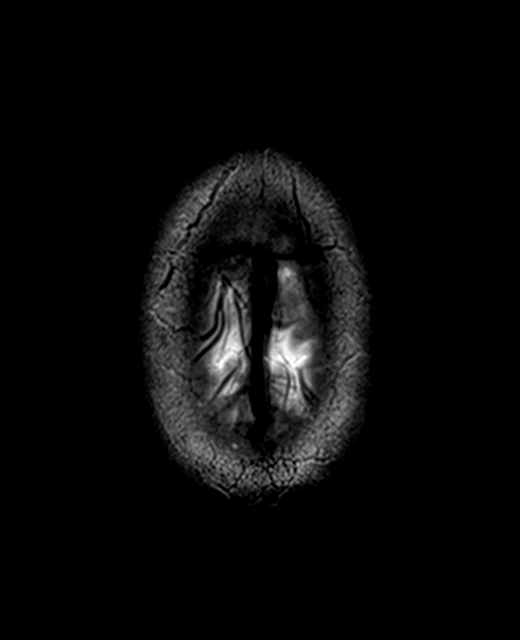

[Series 9: FLAIR · axial · 3.0mm · 0.72mm/px · z∈[-79,+76]mm · 2 of 27 slices shown]
[im 1/27]
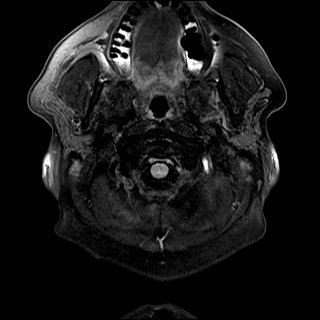
[im 27/27]
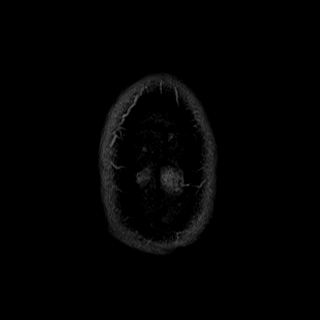

[Series 11: swi_images · axial · 1.5mm · 0.90mm/px · z∈[-71,+71]mm · 6 of 96 slices shown]
[im 1/96]
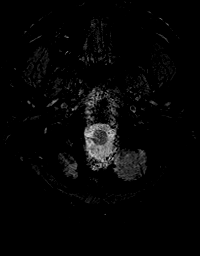
[im 20/96]
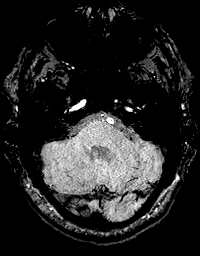
[im 39/96]
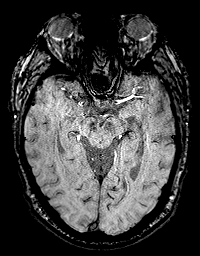
[im 58/96]
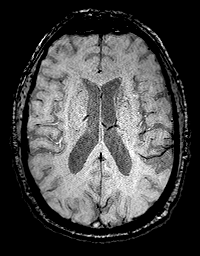
[im 77/96]
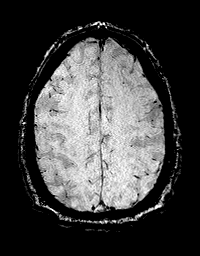
[im 96/96]
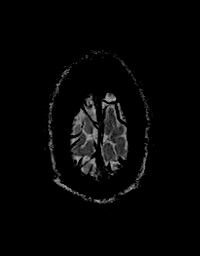

[Series 12: T1 · axial · 1.0mm · 0.90mm/px · z∈[-71,+88]mm · 11 of 160 slices shown (2 of 3)]
[im 1/160]
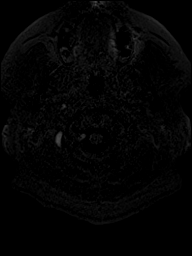
[im 16/160]
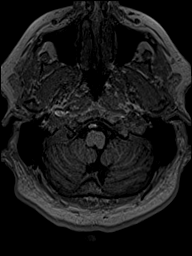
[im 32/160]
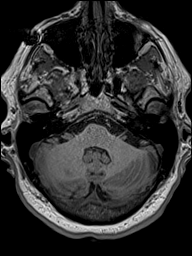
[im 48/160]
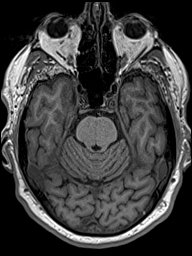
[im 64/160]
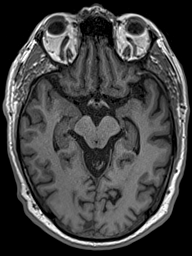
[im 80/160]
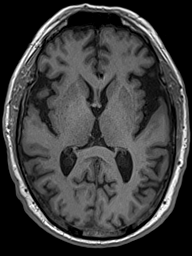
[im 96/160]
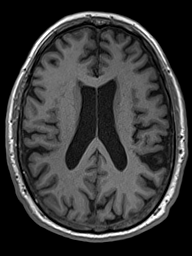
[im 112/160]
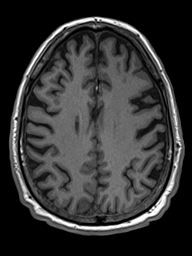
[im 128/160]
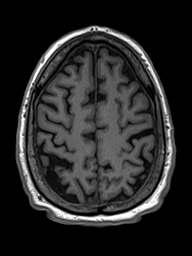
[im 144/160]
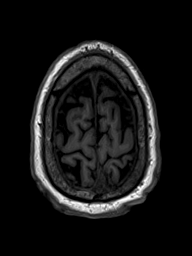
[im 160/160]
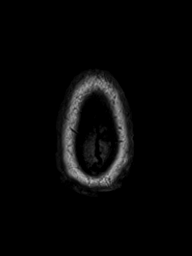

[Series 13: T2 post-contrast · coronal · 4.5mm · 0.36mm/px · 2 of 35 slices shown]
[im 1/35]
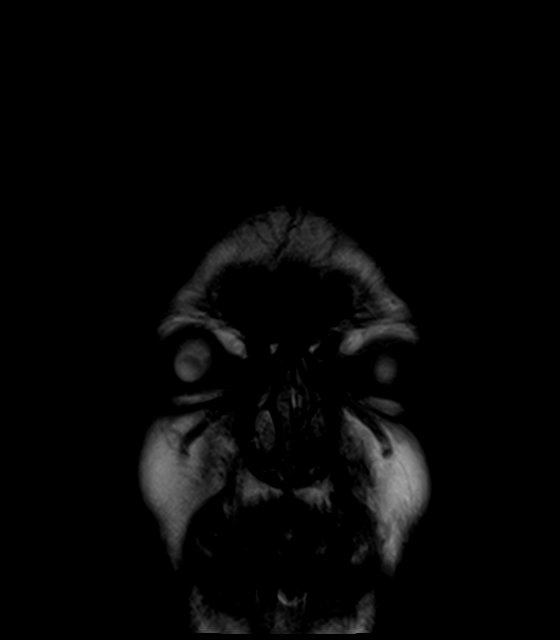
[im 35/35]
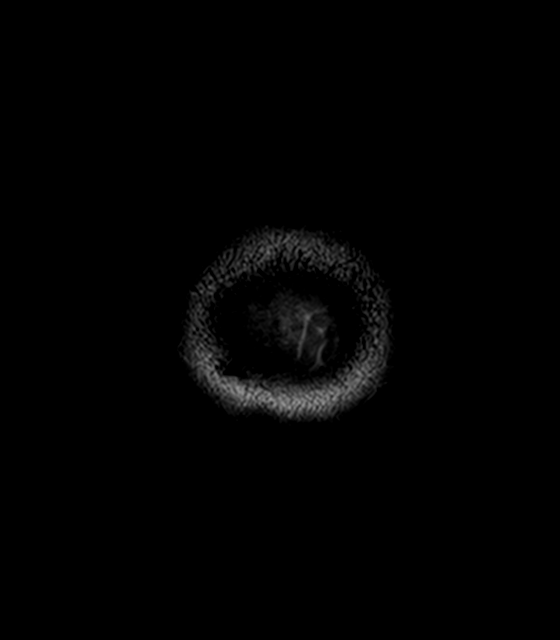

[Series 14: T1 · axial · 1.0mm · 0.90mm/px · z∈[-71,+88]mm · 11 of 160 slices shown (3 of 3)]
[im 1/160]
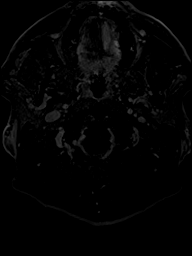
[im 16/160]
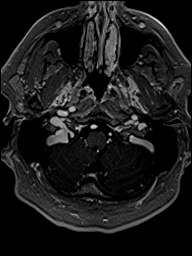
[im 32/160]
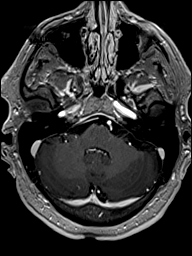
[im 48/160]
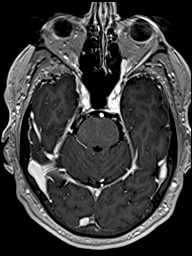
[im 64/160]
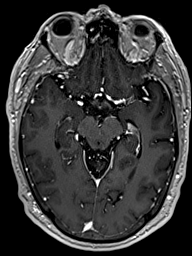
[im 80/160]
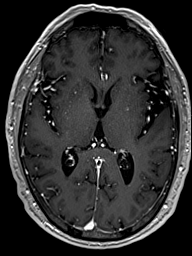
[im 96/160]
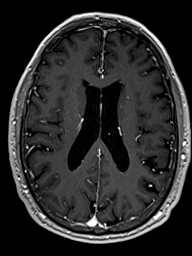
[im 112/160]
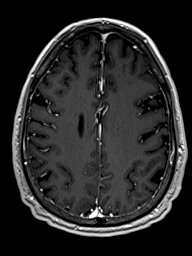
[im 128/160]
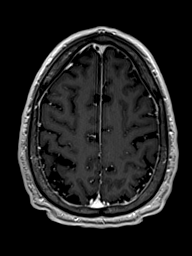
[im 144/160]
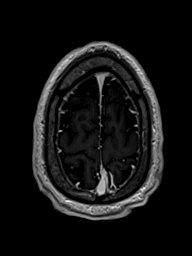
[im 160/160]
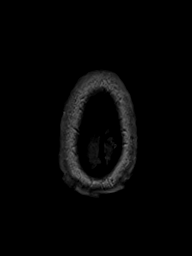

[Series 15: T1 post-contrast · coronal · 4.5mm · 0.72mm/px · 2 of 35 slices shown]
[im 1/35]
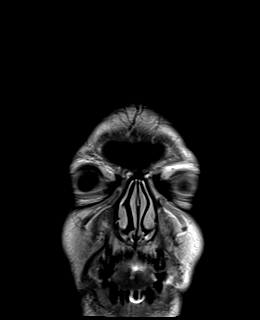
[im 35/35]
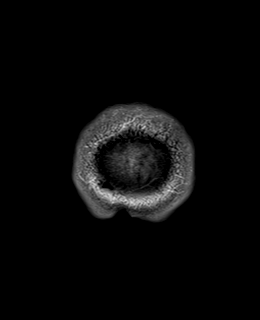

[48 of 48 positions shown; findings below may reference images not displayed]

FINDINGS: Brain: There is no evidence of acute infarct, intracranial
hemorrhage, mass, midline shift, or extra-axial fluid collection.
There is mild cerebral atrophy. The brain is normal in signal. No
abnormal enhancement is identified.

Vascular: Chronically abnormal appearance of the distal left
vertebral artery consistent with occlusion on CTA. Other major
intracranial vascular flow voids are preserved.

Skull and upper cervical spine: Unremarkable bone marrow signal.

Sinuses/Orbits: Unremarkable orbits. Trace left mastoid fluid.
Minimal right maxillary sinus mucosal thickening.

Other: None.
IMPRESSION: 1. No evidence of intracranial metastases or acute abnormality.
2. Mild cerebral atrophy.

## 2019-02-10 ENCOUNTER — Other Ambulatory Visit: Payer: Self-pay | Admitting: Family Medicine

## 2019-02-18 ENCOUNTER — Other Ambulatory Visit: Payer: Self-pay | Admitting: Family Medicine

## 2019-03-03 ENCOUNTER — Inpatient Hospital Stay: Payer: BC Managed Care – PPO | Attending: Oncology | Admitting: Oncology

## 2019-03-03 ENCOUNTER — Other Ambulatory Visit: Payer: Self-pay

## 2019-03-03 VITALS — BP 151/97 | HR 82 | Temp 98.2°F | Resp 17 | Ht 75.0 in | Wt 261.5 lb

## 2019-03-03 DIAGNOSIS — C8590 Non-Hodgkin lymphoma, unspecified, unspecified site: Secondary | ICD-10-CM | POA: Diagnosis not present

## 2019-03-03 DIAGNOSIS — M545 Low back pain: Secondary | ICD-10-CM | POA: Insufficient documentation

## 2019-03-03 DIAGNOSIS — J449 Chronic obstructive pulmonary disease, unspecified: Secondary | ICD-10-CM | POA: Diagnosis not present

## 2019-03-03 DIAGNOSIS — C8520 Mediastinal (thymic) large B-cell lymphoma, unspecified site: Secondary | ICD-10-CM | POA: Diagnosis present

## 2019-03-03 DIAGNOSIS — E119 Type 2 diabetes mellitus without complications: Secondary | ICD-10-CM | POA: Insufficient documentation

## 2019-03-03 DIAGNOSIS — Z79899 Other long term (current) drug therapy: Secondary | ICD-10-CM | POA: Diagnosis not present

## 2019-03-03 DIAGNOSIS — M109 Gout, unspecified: Secondary | ICD-10-CM | POA: Diagnosis not present

## 2019-03-03 DIAGNOSIS — Z87891 Personal history of nicotine dependence: Secondary | ICD-10-CM | POA: Diagnosis not present

## 2019-03-03 DIAGNOSIS — K5903 Drug induced constipation: Secondary | ICD-10-CM | POA: Diagnosis not present

## 2019-03-03 DIAGNOSIS — C7951 Secondary malignant neoplasm of bone: Secondary | ICD-10-CM | POA: Insufficient documentation

## 2019-03-03 DIAGNOSIS — G8929 Other chronic pain: Secondary | ICD-10-CM | POA: Diagnosis not present

## 2019-03-03 DIAGNOSIS — M79601 Pain in right arm: Secondary | ICD-10-CM | POA: Diagnosis not present

## 2019-03-03 NOTE — Progress Notes (Signed)
[facility] OFFICE PROGRESS NOTE   Diagnosis: Non-Hodgkin's lymphoma  INTERVAL HISTORY:   Jeffery Wood returns for a scheduled visit.  Good appetite.  He continues to have pain in the right arm.  He fell on his deck this morning after slipping on ice.  He has noted a fullness at the mid anterior chest.  Objective:  Vital signs in last 24 hours:  Blood pressure (!) 151/97, pulse 82, temperature 98.2 F (36.8 C), temperature source Temporal, resp. rate 17, height 6' 3"  (1.905 m), weight 261 lb 8 oz (118.6 kg), SpO2 98 %.   Limited physical examination secondary to distancing with the Covid pandemic Lymphatics: No cervical, supraclavicular, axillary, or inguinal nodes GI: No hepatosplenomegaly, no mass Vascular: No leg edema Musculoskeletal: No mass at the right upper arm, the area of fullness at the anterior chest is the xiphoid process  Portacath/PICC-without erythema  Lab Results:  Lab Results  Component Value Date   WBC 5.9 12/02/2018   HGB 16.1 12/02/2018   HCT 47.5 12/02/2018   MCV 88.7 12/02/2018   PLT 258.0 12/02/2018   NEUTROABS 4.2 12/02/2018    CMP  Lab Results  Component Value Date   NA 136 12/02/2018   K 4.6 12/02/2018   CL 98 12/02/2018   CO2 27 12/02/2018   GLUCOSE 150 (H) 12/02/2018   BUN 15 12/02/2018   CREATININE 0.76 12/02/2018   CALCIUM 9.6 12/02/2018   PROT 6.7 12/02/2018   ALBUMIN 4.4 12/02/2018   AST 15 12/02/2018   ALT 13 12/02/2018   ALKPHOS 79 12/02/2018   BILITOT 0.7 12/02/2018   GFRNONAA >60 01/18/2018   GFRAA >60 01/18/2018     Medications: I have reviewed the patient's current medications.   Assessment/Plan: 1. High-grade B-cell non-Hodgkin's lymphoma, molecular studies pending, clinical stage IV, IPI-low intermediate risk, FISH panel revealed an 8q24/14q36fsion (MYC/IgH), negative for BCL 6 and BCL-2  Chest x-ray 01/15/2017-medial right upper lobe/perihilar mass.   Chest CT 01/19/2017-mass constricting  the superior vena cava, subcarinal adenopathy, right suprahilar/mediastinal mass.   12/26/2018status postbronchoscopy with transbronchial biopsy of level 7node and biopsy of aright upper lobe lung mass by Dr. GServando Snare There was compression of the right mainstem bronchus. A mass was noted in the right upper lobe. Mediastinal lymph nodes were seen on EBUS. Multiple biopsies of a level 7 node were obtained. Brushings and a biopsy of the right upper lobe mass were obtained. 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 Final pathology was nondiagnostic.   PET scan on 02/02/2017 showed hypermetabolism corresponding to the right sided mediastinal mass,progressive since the CT 01/19/2017. Metastatic disease/lymphoma within the bones, pericardium, stomach, bowel and abdominopelvic nodal stations.   Brain MRI 02/03/2017 showed no evidence of intracranial metastases or acute abnormality.   1/7/2019status postmediastinoscopy.  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 Molecular studies are pending.  Rituximab 02/08/2017  Cycle 1 CHOP 02/09/2017  Cycle 1 R-EPOCH 03/02/2017  Cycle2 R-EPOCH2/22/2019  Prophylactic intrathecal methotrexate 03/27/2017  Restaging PET scan 04/11/2017-significant decrease in FDG uptake associated with the chest, abdomen and pelvis and axial and proximal appendicular skeleton. No new foci of abnormal radiotracer uptake identified.  Cycle 3R-EPOCH3/15/2019with intrathecal methotrexate prophylaxis  Cycle 4R-EPOCH4/5/2019with intrathecal methotrexate prophylaxis  Cycle 5 R-EPOCH4/26/2019with intrathecal methotrexate prophylaxis  2. Chest/upper backand right armpain secondary to #1- resolved. 3. Exertional dyspnea secondary to #1 and COPD.Improved. 4. Asthma 5. Diabetes mellitus 6. Gout 7. Remote history of "carcinoid syndrome" 8. Remote history of tobacco  use 9. Basal cell carcinoma removed from the  right face 10. Chronic low back pain status post epidural steroid injections-followed at a pain management clinic  L4-5 decompression and fusion 01/17/2018 11. 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 Constipation secondary to narcotic analgesics and potentially lymphoma involving the GI tract-improved. 13. Right first toenail infection 06/19/2017- prescribeddoxycycline  Disposition: Jeffery Wood is in remission from non-Hodgkin's lymphoma.  He will return for an office visit in 6 months.  I encouraged him to obtain the COVID-19 vaccine when available.  He will call for new symptoms.  Betsy Coder, MD  03/03/2019  8:18 AM

## 2019-03-05 ENCOUNTER — Telehealth: Payer: Self-pay | Admitting: Oncology

## 2019-03-05 NOTE — Telephone Encounter (Signed)
Scheduled per los. Called and left msg. Mailed printout  °

## 2019-03-11 ENCOUNTER — Other Ambulatory Visit: Payer: Self-pay | Admitting: Family Medicine

## 2019-03-25 ENCOUNTER — Other Ambulatory Visit: Payer: Self-pay | Admitting: Family Medicine

## 2019-03-25 ENCOUNTER — Encounter: Payer: Self-pay | Admitting: Family Medicine

## 2019-03-26 ENCOUNTER — Other Ambulatory Visit: Payer: Self-pay

## 2019-03-26 MED ORDER — LEVALBUTEROL TARTRATE 45 MCG/ACT IN AERO: 2.0000 | INHALATION_SPRAY | Freq: Every day | RESPIRATORY_TRACT | 1 refills | Status: DC | PRN

## 2019-04-08 ENCOUNTER — Encounter: Payer: Self-pay | Admitting: Oncology

## 2019-04-08 ENCOUNTER — Other Ambulatory Visit: Payer: Self-pay | Admitting: Family Medicine

## 2019-04-08 NOTE — Telephone Encounter (Signed)
Rx request 

## 2019-06-03 ENCOUNTER — Other Ambulatory Visit: Payer: Self-pay | Admitting: Family Medicine

## 2019-06-17 ENCOUNTER — Other Ambulatory Visit: Payer: Self-pay | Admitting: Family Medicine

## 2019-07-08 ENCOUNTER — Other Ambulatory Visit: Payer: Self-pay | Admitting: Family Medicine

## 2019-07-08 ENCOUNTER — Encounter: Payer: Self-pay | Admitting: Family Medicine

## 2019-07-08 NOTE — Telephone Encounter (Signed)
Rx request 

## 2019-07-08 NOTE — Telephone Encounter (Signed)
Patient has been scheduled

## 2019-07-15 ENCOUNTER — Other Ambulatory Visit: Payer: Self-pay | Admitting: Family Medicine

## 2019-07-15 ENCOUNTER — Ambulatory Visit: Payer: BC Managed Care – PPO | Admitting: Family Medicine

## 2019-07-15 ENCOUNTER — Other Ambulatory Visit: Payer: Self-pay

## 2019-07-15 ENCOUNTER — Encounter: Payer: Self-pay | Admitting: Family Medicine

## 2019-07-15 VITALS — BP 118/80 | HR 81 | Temp 97.2°F | Ht 75.0 in | Wt 247.0 lb

## 2019-07-15 DIAGNOSIS — M1990 Unspecified osteoarthritis, unspecified site: Secondary | ICD-10-CM | POA: Diagnosis not present

## 2019-07-15 DIAGNOSIS — M792 Neuralgia and neuritis, unspecified: Secondary | ICD-10-CM

## 2019-07-15 DIAGNOSIS — H9313 Tinnitus, bilateral: Secondary | ICD-10-CM | POA: Diagnosis not present

## 2019-07-15 DIAGNOSIS — H9190 Unspecified hearing loss, unspecified ear: Secondary | ICD-10-CM | POA: Diagnosis not present

## 2019-07-15 MED ORDER — GABAPENTIN 300 MG PO CAPS
600.0000 mg | ORAL_CAPSULE | Freq: Three times a day (TID) | ORAL | 3 refills | Status: DC
Start: 2019-07-15 — End: 2019-07-15

## 2019-07-15 MED ORDER — METHYLPREDNISOLONE ACETATE 80 MG/ML IJ SUSP
80.0000 mg | Freq: Once | INTRAMUSCULAR | Status: AC
  Administered 2019-07-15: 80 mg via INTRAMUSCULAR

## 2019-07-15 NOTE — Assessment & Plan Note (Signed)
We will switch to capsule gabapentin 600 mg 3 times daily per patient request.

## 2019-07-15 NOTE — Progress Notes (Signed)
   Jeffery Wood is a 64 y.o. male who presents today for an office visit.  Assessment/Plan:  New/Acute Problems: Hearing Loss / Tinnitus Unclear etiology.  Failed hearing test today.  Offered prednisone burst however patient inclined.  Will give 80 mg IM Depo-Medrol.  Also place referral to ENT.  No other neurologic signs or symptoms.  Chronic Problems Addressed Today: Neuropathic pain We will switch to capsule gabapentin 600 mg 3 times daily per patient request.   Osteoarthritis Right shoulder pain possibly underlying osteoarthritis.  Also possible rotator cuff issues.  She has some improvement with Depo-Medrol injection today.     Subjective:  HPI:  Patient here with sudden onset hearing loss in right ear.  Started 3 weeks ago.  Heard a pop in his right ear.  Since then has noticed progressively worsening hearing loss and ringing in both ears but much worse in the right.  He has symptoms similar that was 10 years ago.  He has had a little lightheadedness.  No vertigo.  No reported weakness or numbness.  He would also like to try switching his gabapentin to capsule instead of tablets.  Has had worsening right shoulder pain.  Worse with sleep.       Objective:  Physical Exam: BP 118/80   Pulse 81   Temp (!) 97.2 F (36.2 C)   Ht 6\' 3"  (1.905 m)   Wt 247 lb (112 kg)   SpO2 98%   BMI 30.87 kg/m   Gen: No acute distress, resting comfortably HEENT: TMs clear bilaterally. Neuro: Grossly normal, moves all extremities Psych: Normal affect and thought content      Doneisha Ivey M. Jerline Pain, MD 07/15/2019 11:05 AM

## 2019-07-15 NOTE — Patient Instructions (Signed)
It was very nice to see you today!  We will give you a cortisone shot today and refer you to see ear nose and throat.  I will send in a new prescription for your gabapentin.  Please let me know if your shoulder pain does not improve with a cortisone shot.  Take care, Dr Jerline Pain  Please try these tips to maintain a healthy lifestyle:   Eat at least 3 REAL meals and 1-2 snacks per day.  Aim for no more than 5 hours between eating.  If you eat breakfast, please do so within one hour of getting up.    Each meal should contain half fruits/vegetables, one quarter protein, and one quarter carbs (no bigger than a computer mouse)   Cut down on sweet beverages. This includes juice, soda, and sweet tea.     Drink at least 1 glass of water with each meal and aim for at least 8 glasses per day   Exercise at least 150 minutes every week.

## 2019-07-15 NOTE — Assessment & Plan Note (Signed)
Right shoulder pain possibly underlying osteoarthritis.  Also possible rotator cuff issues.  She has some improvement with Depo-Medrol injection today.

## 2019-08-07 ENCOUNTER — Other Ambulatory Visit: Payer: Self-pay | Admitting: Family Medicine

## 2019-08-12 ENCOUNTER — Encounter: Payer: Self-pay | Admitting: Family Medicine

## 2019-08-12 NOTE — Telephone Encounter (Signed)
Please call to schedule appointment with the pt.   Thank You

## 2019-08-12 NOTE — Telephone Encounter (Signed)
Patient is scheduled virtually for Monday.

## 2019-08-18 ENCOUNTER — Other Ambulatory Visit: Payer: Self-pay | Admitting: Family Medicine

## 2019-08-18 ENCOUNTER — Telehealth (INDEPENDENT_AMBULATORY_CARE_PROVIDER_SITE_OTHER): Payer: BC Managed Care – PPO | Admitting: Family Medicine

## 2019-08-18 DIAGNOSIS — G8929 Other chronic pain: Secondary | ICD-10-CM | POA: Diagnosis not present

## 2019-08-18 DIAGNOSIS — M545 Low back pain: Secondary | ICD-10-CM

## 2019-08-18 DIAGNOSIS — N400 Enlarged prostate without lower urinary tract symptoms: Secondary | ICD-10-CM | POA: Diagnosis not present

## 2019-08-18 DIAGNOSIS — M1990 Unspecified osteoarthritis, unspecified site: Secondary | ICD-10-CM

## 2019-08-18 MED ORDER — DULOXETINE HCL 30 MG PO CPEP: 30.0000 mg | ORAL_CAPSULE | Freq: Every day | ORAL | 3 refills | Status: DC

## 2019-08-18 MED ORDER — TAMSULOSIN HCL 0.4 MG PO CAPS: 0.4000 mg | ORAL_CAPSULE | Freq: Every day | ORAL | 3 refills | Status: DC

## 2019-08-18 NOTE — Progress Notes (Signed)
   Jeffery Wood is a 64 y.o. male who presents today for a virtual office visit.  Assessment/Plan:  Chronic Problems Addressed Today: Osteoarthritis Managed by pain management.  Has significant difficulty walking due to osteoarthritis and lumbar spondylosis.  Will fill out handicap placard form.  Chronic low back pain No red flags.  Continue management per rheumatology, neurosurgery, and pain management.  Will fill out handicap placard form today.  He is not able to walk for more than 200 feet without stopping to rest.  Refilled Cymbalta today.  He will continue gabapentin 600 mg 3 times daily and tramadol as needed as well.  BPH (benign prostatic hyperplasia) Stable.  Refill Flomax 0.4 mg nightly today.    Subjective:  HPI:  See A/p.        Objective/Observations  Physical Exam: Gen: NAD, resting comfortably Pulm: Normal work of breathing Neuro: Grossly normal, moves all extremities Psych: Normal affect and thought content  Virtual Visit via Video   I connected with Jeffery Wood on 08/18/19 at 11:00 AM EDT by a video enabled telemedicine application and verified that I am speaking with the correct person using two identifiers. The limitations of evaluation and management by telemedicine and the availability of in person appointments were discussed. The patient expressed understanding and agreed to proceed.   Patient location: Home Provider location: [place] participating in the virtual visit: Myself and Patient     Algis Greenhouse. Jerline Pain, MD 08/18/2019 10:41 AM

## 2019-08-18 NOTE — Assessment & Plan Note (Signed)
Managed by pain management.  Has significant difficulty walking due to osteoarthritis and lumbar spondylosis.  Will fill out handicap placard form.

## 2019-08-18 NOTE — Assessment & Plan Note (Signed)
Stable.  Refill Flomax 0.4 mg nightly today.

## 2019-08-18 NOTE — Assessment & Plan Note (Addendum)
No red flags.  Continue management per rheumatology, neurosurgery, and pain management.  Will fill out handicap placard form today.  He is not able to walk for more than 200 feet without stopping to rest.  Refilled Cymbalta today.  He will continue gabapentin 600 mg 3 times daily and tramadol as needed as well.

## 2019-09-01 ENCOUNTER — Inpatient Hospital Stay: Payer: BC Managed Care – PPO | Attending: Oncology | Admitting: Oncology

## 2019-09-01 ENCOUNTER — Telehealth: Payer: Self-pay | Admitting: Oncology

## 2019-09-01 ENCOUNTER — Other Ambulatory Visit: Payer: Self-pay

## 2019-09-01 VITALS — BP 139/87 | HR 71 | Temp 97.9°F | Resp 18 | Ht 75.0 in | Wt 250.8 lb

## 2019-09-01 DIAGNOSIS — E119 Type 2 diabetes mellitus without complications: Secondary | ICD-10-CM | POA: Diagnosis not present

## 2019-09-01 DIAGNOSIS — J449 Chronic obstructive pulmonary disease, unspecified: Secondary | ICD-10-CM | POA: Diagnosis not present

## 2019-09-01 DIAGNOSIS — J45909 Unspecified asthma, uncomplicated: Secondary | ICD-10-CM | POA: Insufficient documentation

## 2019-09-01 DIAGNOSIS — M549 Dorsalgia, unspecified: Secondary | ICD-10-CM | POA: Insufficient documentation

## 2019-09-01 DIAGNOSIS — Z87891 Personal history of nicotine dependence: Secondary | ICD-10-CM | POA: Diagnosis not present

## 2019-09-01 DIAGNOSIS — C8330 Diffuse large B-cell lymphoma, unspecified site: Secondary | ICD-10-CM | POA: Diagnosis not present

## 2019-09-01 DIAGNOSIS — R918 Other nonspecific abnormal finding of lung field: Secondary | ICD-10-CM | POA: Diagnosis not present

## 2019-09-01 DIAGNOSIS — C7951 Secondary malignant neoplasm of bone: Secondary | ICD-10-CM | POA: Insufficient documentation

## 2019-09-01 DIAGNOSIS — R200 Anesthesia of skin: Secondary | ICD-10-CM | POA: Diagnosis not present

## 2019-09-01 DIAGNOSIS — M79601 Pain in right arm: Secondary | ICD-10-CM | POA: Insufficient documentation

## 2019-09-01 DIAGNOSIS — R0600 Dyspnea, unspecified: Secondary | ICD-10-CM | POA: Insufficient documentation

## 2019-09-01 DIAGNOSIS — M545 Low back pain: Secondary | ICD-10-CM | POA: Insufficient documentation

## 2019-09-01 DIAGNOSIS — C8520 Mediastinal (thymic) large B-cell lymphoma, unspecified site: Secondary | ICD-10-CM | POA: Diagnosis present

## 2019-09-01 DIAGNOSIS — Z79899 Other long term (current) drug therapy: Secondary | ICD-10-CM | POA: Insufficient documentation

## 2019-09-01 DIAGNOSIS — L03039 Cellulitis of unspecified toe: Secondary | ICD-10-CM | POA: Insufficient documentation

## 2019-09-01 DIAGNOSIS — G8929 Other chronic pain: Secondary | ICD-10-CM | POA: Diagnosis not present

## 2019-09-01 DIAGNOSIS — M109 Gout, unspecified: Secondary | ICD-10-CM | POA: Insufficient documentation

## 2019-09-01 DIAGNOSIS — K5903 Drug induced constipation: Secondary | ICD-10-CM | POA: Diagnosis not present

## 2019-09-01 NOTE — Telephone Encounter (Signed)
Scheduled per 8/2 los. No avs or calendar needed to be printed. mychart active.

## 2019-09-01 NOTE — Progress Notes (Signed)
[facility] OFFICE PROGRESS NOTE   Diagnosis: Non-Hodgkin's lymphoma  INTERVAL HISTORY:   [name] returns for a scheduled visit.  He reports intentional weight loss by changing the amount of food he eats.  He has persistent numbness and pain in the feet.  He has chronic back pain.  No fever.  Occasional night sweat.  Persistent discomfort in the right arm. He has received the COVID-19 vaccine.  Objective:  Vital signs in last 24 hours:  Blood pressure (!) 139/87, pulse 71, temperature 97.9 F (36.6 C), temperature source Temporal, resp. rate 18, height 6' 3"  (1.905 m), weight (!) 250 lb 12.8 oz (113.8 kg), SpO2 97 %.    Lymphatics: No cervical, supraclavicular, axillary, or inguinal nodes Resp: Lungs clear bilaterally Cardio: Regular rate and rhythm GI: No hepatosplenomegaly, no mass, nontender Vascular: No leg edema    Lab Results:  Lab Results  Component Value Date   WBC 5.9 12/02/2018   HGB 16.1 12/02/2018   HCT 47.5 12/02/2018   MCV 88.7 12/02/2018   PLT 258.0 12/02/2018   NEUTROABS 4.2 12/02/2018    CMP  Lab Results  Component Value Date   NA 136 12/02/2018   K 4.6 12/02/2018   CL 98 12/02/2018   CO2 27 12/02/2018   GLUCOSE 150 (H) 12/02/2018   BUN 15 12/02/2018   CREATININE 0.76 12/02/2018   CALCIUM 9.6 12/02/2018   PROT 6.7 12/02/2018   ALBUMIN 4.4 12/02/2018   AST 15 12/02/2018   ALT 13 12/02/2018   ALKPHOS 79 12/02/2018   BILITOT 0.7 12/02/2018   GFRNONAA >60 01/18/2018   GFRAA >60 01/18/2018     Medications: I have reviewed the patient's current medications.   Assessment/Plan: 1. High-grade B-cell non-Hodgkin's lymphoma, molecular studies pending, clinical stage IV, IPI-low intermediate risk, FISH panel revealed an 8q24/14q1fsion (MYC/IgH), negative for BCL 6 and BCL-2  Chest x-ray 01/15/2017-medial right upper lobe/perihilar mass.   Chest CT 01/19/2017-mass constricting the superior vena cava, subcarinal  adenopathy, right suprahilar/mediastinal mass.   12/26/2018status postbronchoscopy with transbronchial biopsy of level 7node and biopsy of aright upper lobe lung mass by Dr. GServando Snare There was compression of the right mainstem bronchus. A mass was noted in the right upper lobe. Mediastinal lymph nodes were seen on EBUS. Multiple biopsies of a level 7 node were obtained. Brushings and a biopsy of the right upper lobe mass were obtained. 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 Final pathology was nondiagnostic.   PET scan on 02/02/2017 showed hypermetabolism corresponding to the right sided mediastinal mass,progressive since the CT 01/19/2017. Metastatic disease/lymphoma within the bones, pericardium, stomach, bowel and abdominopelvic nodal stations.   Brain MRI 02/03/2017 showed no evidence of intracranial metastases or acute abnormality.   1/7/2019status postmediastinoscopy.  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 Molecular studies are pending.  Rituximab 02/08/2017  Cycle 1 CHOP 02/09/2017  Cycle 1 R-EPOCH 03/02/2017  Cycle2 R-EPOCH2/22/2019  Prophylactic intrathecal methotrexate 03/27/2017  Restaging PET scan 04/11/2017-significant decrease in FDG uptake associated with the chest, abdomen and pelvis and axial and proximal appendicular skeleton. No new foci of abnormal radiotracer uptake identified.  Cycle 3R-EPOCH3/15/2019with intrathecal methotrexate prophylaxis  Cycle 4R-EPOCH4/5/2019with intrathecal methotrexate prophylaxis  Cycle 5 R-EPOCH4/26/2019with intrathecal methotrexate prophylaxis  2. Chest/upper backand right armpain secondary to #1- resolved. 3. Exertional dyspnea secondary to #1 and COPD.Improved. 4. Asthma 5. Diabetes mellitus 6. Gout 7. Remote history of "carcinoid syndrome" 8. Remote history of tobacco use 9. Basal cell carcinoma  removed from the right face 10. Chronic low back  pain status post epidural steroid injections-followed at a pain management clinic  L4-5 decompression and fusion 01/17/2018 11. 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 Constipation secondary to narcotic analgesics and potentially lymphoma involving the GI tract-improved. 13. Right first toenail infection 06/19/2017- prescribeddoxycycline 14. 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    Disposition: [name] remains in clinical remission from non-Hodgkin's lymphoma.  He will return for an office visit in 6 months. He has persistent neuropathy symptoms in the feet.  This may be related to diabetes, spine disease, or chemotherapy. Betsy Coder, MD  09/01/2019  9:12 AM

## 2019-10-09 ENCOUNTER — Other Ambulatory Visit: Payer: Self-pay | Admitting: Family Medicine

## 2019-11-12 ENCOUNTER — Other Ambulatory Visit: Payer: Self-pay

## 2019-11-12 ENCOUNTER — Telehealth: Payer: Self-pay

## 2019-11-12 ENCOUNTER — Encounter: Payer: Self-pay | Admitting: Family Medicine

## 2019-11-12 MED ORDER — ACCU-CHEK SOFTCLIX LANCETS MISC: 3 refills | Status: DC

## 2019-11-12 MED ORDER — ACCU-CHEK GUIDE W/DEVICE KIT: PACK | 3 refills | Status: DC

## 2019-11-12 MED ORDER — GLUCOSE BLOOD VI STRP: ORAL_STRIP | 3 refills | Status: DC

## 2019-11-12 NOTE — Telephone Encounter (Signed)
Pharmacy wants to know if Dr.Parker could prescribe Lancets and strips for pt

## 2019-11-12 NOTE — Telephone Encounter (Signed)
Sent Rx to pharmacy  

## 2019-11-25 ENCOUNTER — Other Ambulatory Visit (HOSPITAL_COMMUNITY): Payer: Self-pay | Admitting: Neurosurgery

## 2019-12-03 ENCOUNTER — Ambulatory Visit: Payer: BC Managed Care – PPO | Attending: Internal Medicine

## 2019-12-03 ENCOUNTER — Other Ambulatory Visit (HOSPITAL_COMMUNITY): Payer: Self-pay | Admitting: Internal Medicine

## 2019-12-03 DIAGNOSIS — Z23 Encounter for immunization: Secondary | ICD-10-CM

## 2019-12-03 NOTE — Progress Notes (Signed)
   Covid-[facility] Vaccination [facility]  Name:  TYJAE ISSA    MRN: [id number] DOB: 1955-09-01  12/03/2019  Mr. Gashi was observed post Covid-19 immunization for 15 minutes without incident. He was provided with Vaccine Information Sheet and instruction to access the V-Safe system.   Mr. Narang was instructed to call 911 with any severe reactions post vaccine: Marland Kitchen Difficulty breathing  . Swelling of face and throat  . A fast heartbeat  . A bad rash all over body  . Dizziness and weakness

## 2019-12-03 NOTE — Progress Notes (Signed)
.  covidobs 

## 2019-12-16 ENCOUNTER — Encounter: Payer: Self-pay | Admitting: Family Medicine

## 2019-12-16 ENCOUNTER — Other Ambulatory Visit: Payer: Self-pay

## 2019-12-16 ENCOUNTER — Ambulatory Visit (INDEPENDENT_AMBULATORY_CARE_PROVIDER_SITE_OTHER): Payer: BC Managed Care – PPO | Admitting: Family Medicine

## 2019-12-16 VITALS — BP 147/78 | HR 78 | Temp 98.0°F | Ht 75.0 in | Wt 250.2 lb

## 2019-12-16 DIAGNOSIS — R739 Hyperglycemia, unspecified: Secondary | ICD-10-CM

## 2019-12-16 DIAGNOSIS — Z13220 Encounter for screening for lipoid disorders: Secondary | ICD-10-CM

## 2019-12-16 DIAGNOSIS — M792 Neuralgia and neuritis, unspecified: Secondary | ICD-10-CM

## 2019-12-16 DIAGNOSIS — E669 Obesity, unspecified: Secondary | ICD-10-CM

## 2019-12-16 DIAGNOSIS — Z0001 Encounter for general adult medical examination with abnormal findings: Secondary | ICD-10-CM | POA: Diagnosis not present

## 2019-12-16 DIAGNOSIS — N400 Enlarged prostate without lower urinary tract symptoms: Secondary | ICD-10-CM

## 2019-12-16 DIAGNOSIS — H5789 Other specified disorders of eye and adnexa: Secondary | ICD-10-CM

## 2019-12-16 DIAGNOSIS — R42 Dizziness and giddiness: Secondary | ICD-10-CM | POA: Diagnosis not present

## 2019-12-16 DIAGNOSIS — J449 Chronic obstructive pulmonary disease, unspecified: Secondary | ICD-10-CM | POA: Insufficient documentation

## 2019-12-16 DIAGNOSIS — Z125 Encounter for screening for malignant neoplasm of prostate: Secondary | ICD-10-CM

## 2019-12-16 DIAGNOSIS — G8929 Other chronic pain: Secondary | ICD-10-CM

## 2019-12-16 DIAGNOSIS — M545 Low back pain, unspecified: Secondary | ICD-10-CM

## 2019-12-16 DIAGNOSIS — G4700 Insomnia, unspecified: Secondary | ICD-10-CM | POA: Diagnosis not present

## 2019-12-16 DIAGNOSIS — K219 Gastro-esophageal reflux disease without esophagitis: Secondary | ICD-10-CM

## 2019-12-16 DIAGNOSIS — J4530 Mild persistent asthma, uncomplicated: Secondary | ICD-10-CM

## 2019-12-16 DIAGNOSIS — Z23 Encounter for immunization: Secondary | ICD-10-CM | POA: Diagnosis not present

## 2019-12-16 DIAGNOSIS — Z6831 Body mass index (BMI) 31.0-31.9, adult: Secondary | ICD-10-CM

## 2019-12-16 DIAGNOSIS — I10 Essential (primary) hypertension: Secondary | ICD-10-CM

## 2019-12-16 MED ORDER — DULOXETINE HCL 60 MG PO CPEP
60.0000 mg | ORAL_CAPSULE | Freq: Every day | ORAL | 3 refills | Status: DC
Start: 2019-12-16 — End: 2020-09-01

## 2019-12-16 NOTE — Assessment & Plan Note (Signed)
-  Continue omeprazole 20 mg daily

## 2019-12-16 NOTE — Patient Instructions (Signed)
It was very nice to see you today!  Please stop the Flomax.  This could be causing some of your dizziness.  We will increase Cymbalta to 60 mg daily.  I hope this should help some with the pain in your difficulty sleeping.  You have a small abrasion on the outer part of your eye.  This should heal normally without any issue.  We will check blood work today.  Give your flu vaccine today.  I will see you back in 6 months to recheck your blood sugar depending on your blood work today.  Please come back to see me sooner if needed.  Take care, Dr Jerline Pain  Please try these tips to maintain a healthy lifestyle:   Eat at least 3 REAL meals and 1-2 snacks per day.  Aim for no more than 5 hours between eating.  If you eat breakfast, please do so within one hour of getting up.    Each meal should contain half fruits/vegetables, one quarter protein, and one quarter carbs (no bigger than a computer mouse)   Cut down on sweet beverages. This includes juice, soda, and sweet tea.     Drink at least 1 glass of water with each meal and aim for at least 8 glasses per day   Exercise at least 150 minutes every week.    Preventive Care 64-60 Years Old, Male Preventive care refers to lifestyle choices and visits with your health care provider that can promote health and wellness. This includes:  A yearly physical exam. This is also called an annual well check.  Regular dental and eye exams.  Immunizations.  Screening for certain conditions.  Healthy lifestyle choices, such as eating a healthy diet, getting regular exercise, not using drugs or products that contain nicotine and tobacco, and limiting alcohol use. What can I expect for my preventive care visit? Physical exam Your health care provider will check:  Height and weight. These may be used to calculate body mass index (BMI), which is a measurement that tells if you are at a healthy weight.  Heart rate and blood  pressure.  Your skin for abnormal spots. Counseling Your health care provider may ask you questions about:  Alcohol, tobacco, and drug use.  Emotional well-being.  Home and relationship well-being.  Sexual activity.  Eating habits.  Work and work Statistician. What immunizations do I need?  Influenza (flu) vaccine  This is recommended every year. Tetanus, diphtheria, and pertussis (Tdap) vaccine  You may need a Td booster every 10 years. Varicella (chickenpox) vaccine  You may need this vaccine if you have not already been vaccinated. Zoster (shingles) vaccine  You may need this after age 64. Measles, mumps, and rubella (MMR) vaccine  You may need at least one dose of MMR if you were born in 1957 or later. You may also need a second dose. Pneumococcal conjugate (PCV13) vaccine  You may need this if you have certain conditions and were not previously vaccinated. Pneumococcal polysaccharide (PPSV23) vaccine  You may need one or two doses if you smoke cigarettes or if you have certain conditions. Meningococcal conjugate (MenACWY) vaccine  You may need this if you have certain conditions. Hepatitis A vaccine  You may need this if you have certain conditions or if you travel or work in places where you may be exposed to hepatitis A. Hepatitis B vaccine  You may need this if you have certain conditions or if you travel or work in places where you  may be exposed to hepatitis B. Haemophilus influenzae type b (Hib) vaccine  You may need this if you have certain risk factors. Human papillomavirus (HPV) vaccine  If recommended by your health care provider, you may need three doses over 6 months. You may receive vaccines as individual doses or as more than one vaccine together in one shot (combination vaccines). Talk with your health care provider about the risks and benefits of combination vaccines. What tests do I need? Blood tests  Lipid and cholesterol levels. These  may be checked every 5 years, or more frequently if you are over 64 years old.  Hepatitis C test.  Hepatitis B test. Screening  Lung cancer screening. You may have this screening every year starting at age 64 if you have a 30-pack-year history of smoking and currently smoke or have quit within the past 15 years.  Prostate cancer screening. Recommendations will vary depending on your family history and other risks.  Colorectal cancer screening. All adults should have this screening starting at age 30 and continuing until age 64. Your health care provider may recommend screening at age 71 if you are at increased risk. You will have tests every 1-10 years, depending on your results and the type of screening test.  Diabetes screening. This is done by checking your blood sugar (glucose) after you have not eaten for a while (fasting). You may have this done every 1-3 years.  Sexually transmitted disease (STD) testing. Follow these instructions at home: Eating and drinking  Eat a diet that includes fresh fruits and vegetables, whole grains, lean protein, and low-fat dairy products.  Take vitamin and mineral supplements as recommended by your health care provider.  Do not drink alcohol if your health care provider tells you not to drink.  If you drink alcohol: ? Limit how much you have to 0-2 drinks a day. ? Be aware of how much alcohol is in your drink. In the [place] one drink equals one 12 oz bottle of beer (355 mL), one 5 oz glass of wine (148 mL), or one 1 oz glass of hard liquor (44 mL). Lifestyle  Take daily care of your teeth and gums.  Stay active. Exercise for at least 30 minutes on 5 or more days each week.  Do not use any products that contain nicotine or tobacco, such as cigarettes, e-cigarettes, and chewing tobacco. If you need help quitting, ask your health care provider.  If you are sexually active, practice safe sex. Use a condom or other form of protection to prevent STIs  (sexually transmitted infections).  Talk with your health care provider about taking a low-dose aspirin every day starting at age 64. What's next?  Go to your health care provider once a year for a well check visit.  Ask your health care provider how often you should have your eyes and teeth checked.  Stay up to date on all vaccines. This information is not intended to replace advice given to you by your health care provider. Make sure you discuss any questions you have with your health care provider. Document Revised: 01/10/2018 Document Reviewed: 01/10/2018 Elsevier Patient Education  2020 [employer].

## 2019-12-16 NOTE — Assessment & Plan Note (Signed)
At goal per JNC 8 off medications. ?

## 2019-12-16 NOTE — Assessment & Plan Note (Signed)
Check A1c.  Continue Metformin 1000 mg twice daily. 

## 2019-12-16 NOTE — Progress Notes (Signed)
Chief Complaint:  Jeffery Wood is a 64 y.o. male who presents today for his annual comprehensive physical exam.    Assessment/Plan:  New/Acute Problems: Dizziness Likely multifactorial.  No red flags.  Seems to be consistent with orthostasis.  Will stop Flomax as this is likely contributing.  Also possibly has some degree of neuropathy playing a role as well.  They consider referral to cardiology for further testing if symptoms not improved with Flomax.  Encourage good oral hydration.  Discussed reasons to return to care.  Eye Irritation Fluorescein exam with only a very mild superficial abrasion on lateral sclera.  Reassured patient.  No red flags.  Suspect this will continue to heal normally without any significant issue.  Discussed reasons to return to care.  Chronic Problems Addressed Today: Insomnia Not controlled.  Will increase Cymbalta to 60 mg daily.  Hopefully this should help.  He will check with me in a few weeks via MyChart.  Hyperglycemia Check A1c.  Continue Metformin 1000 mg twice daily.  GERD (gastroesophageal reflux disease) Continue omeprazole 20 mg daily.  Chronic low back pain No red flags.  He is seeing rheumatology and pain management.  Will increase Cymbalta to 60 mg daily.  He will continue tramadol as needed and gabapentin 600 mg 3 times daily.  BPH (benign prostatic hyperplasia) Flomax likely contributing to his dizziness.  We will stop today.  He will check me in a couple weeks to let me know how symptoms are doing.  Asthma Stable.  Continue Spiriva and Xopenex.  Hypertension At goal per JNC 8 off medications.  Neuropathic pain We will increase Cymbalta to 60 mg daily.  Continue gabapentin 600 mg 3 times daily.   Body mass index is 31.27 kg/m. / Obese  BMI Metric Follow Up - 12/16/19 0848      BMI Metric Follow Up-Please document annually   BMI Metric Follow Up Education provided            Preventative Healthcare: Flu vaccine given  today. UTD on covid vaccine.  Up-to-date on colon cancer screening.  Will check labs including CBC, CMET, TSH, PSA, lipid panel.  Patient Counseling(The following topics were reviewed and/or handout was given):  -Nutrition: Stressed importance of moderation in sodium/caffeine intake, saturated fat and cholesterol, caloric balance, sufficient intake of fresh fruits, vegetables, and fiber.  -Stressed the importance of regular exercise.   -Substance Abuse: Discussed cessation/primary prevention of tobacco, alcohol, or other drug use; driving or other dangerous activities under the influence; availability of treatment for abuse.   -Injury prevention: Discussed safety belts, safety helmets, smoke detector, smoking near bedding or upholstery.   -Sexuality: Discussed sexually transmitted diseases, partner selection, use of condoms, avoidance of unintended pregnancy and contraceptive alternatives.   -Dental health: Discussed importance of regular tooth brushing, flossing, and dental visits.  -Health maintenance and immunizations reviewed. Please refer to Health maintenance section.  Return to care in 1 year for next preventative visit.     Subjective:  HPI:  See A/P for status of chronic conditions.  He has a few additional issues he would like to discuss as outlined below  He has had intermittent dizziness for the past year or so.  Typically occurs when going from lying to standing in the middle of the night.  Feels lightheaded like he is, pass out.  No room spinning sensation.  Typically does not have symptoms during the day.  No worsening weakness or numbness but does have peripheral neuropathy that  seems to be at baseline.  No reported chest pain or shortness of breath  He also irritated his eye few days ago.  He was in the woods spreading the record and thinks he may have got something in it.  Located to lateral aspect of right eye.  Has been using over-the-counter drops which is  helped.  Lifestyle Diet: Room for improvement.  Exercise: Very active daily.   Depression screen PHQ 2/9 12/16/2019  Decreased Interest 0  Down, Depressed, Hopeless 0  PHQ - 2 Score 0  Altered sleeping -  Tired, decreased energy -  Change in appetite -  Feeling bad or failure about yourself  -  Trouble concentrating -  Moving slowly or fidgety/restless -  Suicidal thoughts -  PHQ-9 Score -  Difficult doing work/chores -    Health Maintenance Due  Topic Date Due  . INFLUENZA VACCINE  08/31/2019     ROS: Per HPI, otherwise a complete review of systems was negative.   PMH:  The following were reviewed and entered/updated in epic: Past Medical History:  Diagnosis Date  . Arthritis   . Asthma   . Basal cell carcinoma (BCC) of right temple region   . Cancer ([place])    basal cell of right temple; carcinoids  . Complication of anesthesia    hard to wake up after bronchoscopy  . COPD (chronic obstructive pulmonary disease) ([place])   . Diabetes ([place])   . Dyspnea   . GERD (gastroesophageal reflux disease)   . Gout   . Headache   . History of hiatal hernia 05/16/2006   small noted on EGD  . Hypertension   . Lung mass   . 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 Neuromuscular disorder ([place])   . 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  . Sleep apnea    uses mouth piece   Patient Active Problem List   Diagnosis Date Noted  . Insomnia 12/16/2019  . Spondylolisthesis of lumbar region 01/17/2018  . Hypertension 11/29/2017  . Asthma 11/29/2017  . BPH (benign prostatic hyperplasia) 11/29/2017  . Chronic low back pain 11/29/2017  . Osteoarthritis 11/29/2017  . Psoriasis 11/29/2017  . GERD (gastroesophageal reflux disease) 11/29/2017  . Neuropathic pain 04/14/2017  . Constipation 03/04/2017  . 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 NHL (non-Hodgkin's lymphoma) ([place]) 02/07/2017  . OSA (obstructive sleep apnea) 07/22/2013  . Hyperglycemia  12/07/2010   Past Surgical History:  Procedure Laterality Date  . BICEPS TENDON REPAIR Left   . CARPAL TUNNEL RELEASE    . COLONOSCOPY    . FINGER SURGERY    . KNEE ARTHROSCOPY     ACL tear  . LEFT HEART CATHETERIZATION WITH CORONARY ANGIOGRAM N/A 12/07/2010   Procedure: LEFT HEART CATHETERIZATION WITH CORONARY ANGIOGRAM;  Surgeon: Leonie Man, MD;  [facility] C[facility] LAB;  Service: Cardiovascular;  Laterality: N/A;  . PILONIDAL CYST EXCISION    . PORT-A-CATH REMOVAL N/A 11/09/2017   Procedure: REMOVAL PORT-A-CATH;  Surgeon: [name], MD;  Location: [place];  Service: General;  Laterality: N/A;  local  . PORTACATH PLACEMENT Left 02/27/2017   Procedure: INSERTION PORT-A-CATH;  Surgeon: [name], MD;  Location: [facility] ORS;  Service: General;  Laterality: Left;  . UPPER GASTROINTESTINAL ENDOSCOPY  05/16/2006  . VIDEO BRONCHOSCOPY WITH ENDOBRONCHIAL ULTRASOUND N/A 01/24/2017   Procedure: VIDEO BRONCHOSCOPY WITH ENDOBRONCHIAL ULTRASOUND with TRANSBRONCHIAL BIOPSY;  Surgeon: Grace Isaac, MD;  Location: [facility] OR;  Service: Thoracic;  Laterality: N/A;  . VIDEO MEDIASTINOSCOPY N/A 02/05/2017   Procedure: VIDEO MEDIASTINOSCOPY;  Surgeon: Grace Isaac, MD;  Location: [facility] OR;  Service: Thoracic;  Laterality: N/A;    Family History  Problem Relation Age of Onset  . Emphysema Mother   . Asthma Mother   . Heart disease Father     Medications- reviewed and updated Current Outpatient Medications  Medication Sig Dispense Refill  . Accu-Chek Softclix Lancets lancets Use to test blood sugars daily. Dx: E11.9 100 each 3  . aspirin EC 81 MG tablet Take 81 mg by mouth daily.      . betamethasone dipropionate (DIPROLENE) 0.05 % cream Apply 1 application topically daily as needed (for psoriasis).     . Blood Glucose Monitoring Suppl (ACCU-CHEK GUIDE) w/Device KIT Use to test blood sugars daily. Dx: E11.9 1 kit 3  . celecoxib (CELEBREX) 200 MG capsule Take 200 mg by  mouth daily.     . clonazePAM (KLONOPIN) 0.5 MG tablet TAKE ONE TABLET BY MOUTH EVERY NIGHT AT BEDTIME 90 tablet 0  . DULoxetine (CYMBALTA) 60 MG capsule Take 1 capsule (60 mg total) by mouth at bedtime. 90 capsule 3  . gabapentin (NEURONTIN) 300 MG capsule Take 2 capsules (600 mg total) by mouth 3 (three) times daily. 540 capsule 3  . glucose blood test strip Use to test blood sugars daily. Dx: E11.9 100 each 3  . levalbuterol (XOPENEX HFA) 45 MCG/ACT inhaler Inhale 2 puffs into the lungs daily as needed for wheezing or shortness of breath. 1 Inhaler 1  . loratadine (CLARITIN) 10 MG tablet Take 10 mg by mouth daily.     . metFORMIN (GLUCOPHAGE) 1000 MG tablet TAKE ONE (1) TABLET BY MOUTH TWO (2) TIMES DAILY 180 tablet PRN  . omeprazole (PRILOSEC) 20 MG capsule TAKE 1 CAPSULE BY MOUTH 2 TIMES DAILY. 180 capsule 2  . SPIRIVA HANDIHALER 18 MCG inhalation capsule PLACE ONE CAPSULE IN THE INHALER AND INHALE ONCE DAILY 30 capsule PRN  . traMADol (ULTRAM) 50 MG tablet Take 50 mg by mouth every 6 (six) hours as needed. Takes at bedtime     No current facility-administered medications for this visit.    Allergies-reviewed and updated No Known Allergies  Social History   Socioeconomic History  . Marital status: Married    Spouse name: Not on file  . Number of children: Not on file  . Years of education: Not on file  . Highest education level: Not on file  Occupational History  . Occupation: retired  Tobacco Use  . Smoking status: Former Smoker    Packs/day: 3.00    Years: 20.00    Pack years: 60.00    Types: Cigarettes    Quit date: 01/31/1988    Years since quitting: 31.8  . Smokeless tobacco: Never Used  Vaping Use  . Vaping Use: Never used  Substance and Sexual Activity  . Alcohol use: No  . Drug use: No  . Sexual activity: Yes    Partners: Female    Birth control/protection: None  Other Topics Concern  . Not on file  Social History Narrative  . Not on file   Social  Determinants of Health   Financial Resource Strain:   . Difficulty of Paying Living Expenses: Not on file  Food Insecurity:   . Worried About Charity fundraiser in the Last Year: Not on file  . Ran Out of Food in the Last Year:  Not on file  Transportation Needs:   . Lack of Transportation (Medical): Not on file  . Lack of Transportation (Non-Medical): Not on file  Physical Activity:   . Days of Exercise per Week: Not on file  . Minutes of Exercise per Session: Not on file  Stress:   . Feeling of Stress : Not on file  Social Connections:   . Frequency of Communication with Friends and Family: Not on file  . Frequency of Social Gatherings with Friends and Family: Not on file  . Attends Religious Services: Not on file  . Active Member of Clubs or Organizations: Not on file  . Attends Archivist Meetings: Not on file  . Marital Status: Not on file        Objective:  Physical Exam: BP (!) 147/78   Pulse 78   Temp 98 F (36.7 C) (Temporal)   Ht 6' 3"  (1.905 m)   Wt 250 lb 3.2 oz (113.5 kg)   SpO2 97%   BMI 31.27 kg/m   Body mass index is 31.27 kg/m. Wt Readings from Last 3 Encounters:  12/16/19 250 lb 3.2 oz (113.5 kg)  09/01/19 (!) 250 lb 12.8 oz (113.8 kg)  07/15/19 247 lb (112 kg)   Gen: NAD, resting comfortably HEENT: TMs normal bilaterally. OP clear. No thyromegaly noted.  Fluorescein exam with very minor superficial abrasion to right lateral sclera.  Extraocular ocular is intact.  Visual acuity intact. CV: RRR with no murmurs appreciated Pulm: NWOB, CTAB with no crackles, wheezes, or rhonchi GI: Normal bowel sounds present. Soft, Nontender, Nondistended. MSK: no edema, cyanosis, or clubbing noted Skin: warm, dry Neuro: CN2-12 grossly intact. Strength 5/5 in upper and lower extremities. Reflexes symmetric and intact bilaterally.  Psych: Normal affect and thought content     Sosie Gato M. Jerline Pain, MD 12/16/2019 8:48 AM

## 2019-12-16 NOTE — Assessment & Plan Note (Signed)
Flomax likely contributing to his dizziness.  We will stop today.  He will check me in a couple weeks to let me know how symptoms are doing.

## 2019-12-16 NOTE — Assessment & Plan Note (Signed)
No red flags.  He is seeing rheumatology and pain management.  Will increase Cymbalta to 60 mg daily.  He will continue tramadol as needed and gabapentin 600 mg 3 times daily.

## 2019-12-16 NOTE — Assessment & Plan Note (Signed)
Stable.  Continue Spiriva and Xopenex.

## 2019-12-16 NOTE — Assessment & Plan Note (Signed)
We will increase Cymbalta to 60 mg daily.  Continue gabapentin 600 mg 3 times daily.

## 2019-12-16 NOTE — Assessment & Plan Note (Signed)
Not controlled.  Will increase Cymbalta to 60 mg daily.  Hopefully this should help.  He will check with me in a few weeks via MyChart.

## 2019-12-17 LAB — COMPREHENSIVE METABOLIC PANEL
AG Ratio: 1.9 (calc) (ref 1.0–2.5)
ALT: 13 U/L (ref 9–46)
AST: 15 U/L (ref 10–35)
Albumin: 4.2 g/dL (ref 3.6–5.1)
Alkaline phosphatase (APISO): 71 U/L (ref 35–144)
BUN: 11 mg/dL (ref 7–25)
CO2: 28 mmol/L (ref 20–32)
Calcium: 9.5 mg/dL (ref 8.6–10.3)
Chloride: 102 mmol/L (ref 98–110)
Creat: 0.7 mg/dL (ref 0.70–1.25)
Globulin: 2.2 g/dL (calc) (ref 1.9–3.7)
Glucose, Bld: 129 mg/dL — ABNORMAL HIGH (ref 65–99)
Potassium: 4.6 mmol/L (ref 3.5–5.3)
Sodium: 138 mmol/L (ref 135–146)
Total Bilirubin: 0.7 mg/dL (ref 0.2–1.2)
Total Protein: 6.4 g/dL (ref 6.1–8.1)

## 2019-12-17 LAB — LIPID PANEL
Cholesterol: 182 mg/dL (ref <200)
HDL: 44 mg/dL (ref >=40)
LDL Cholesterol (Calc): 119 mg/dL (calc) — ABNORMAL HIGH
Non-HDL Cholesterol (Calc): 138 mg/dL (calc) — ABNORMAL HIGH (ref <130)
Total CHOL/HDL Ratio: 4.1 (calc) (ref <5.0)
Triglycerides: 90 mg/dL (ref <150)

## 2019-12-17 LAB — CBC
HCT: 48.8 % (ref 38.5–50.0)
Hemoglobin: 16.2 g/dL (ref 13.2–17.1)
MCH: 29.4 pg (ref 27.0–33.0)
MCHC: 33.2 g/dL (ref 32.0–36.0)
MCV: 88.6 fL (ref 80.0–100.0)
MPV: 13 fL — ABNORMAL HIGH (ref 7.5–12.5)
Platelets: 228 10*3/uL (ref 140–400)
RBC: 5.51 10*6/uL (ref 4.20–5.80)
RDW: 12.5 % (ref 11.0–15.0)
WBC: 7 10*3/uL (ref 3.8–10.8)

## 2019-12-17 LAB — PSA: PSA: 0.35 ng/mL (ref <=4.0)

## 2019-12-17 LAB — HEMOGLOBIN A1C
Hgb A1c MFr Bld: 6 % of total Hgb — ABNORMAL HIGH (ref <5.7)
Mean Plasma Glucose: 126 (calc)
eAG (mmol/L): 7 (calc)

## 2019-12-17 LAB — TSH: TSH: 1.02 mIU/L (ref 0.40–4.50)

## 2019-12-17 NOTE — Progress Notes (Signed)
Please inform patient of the following:  Labs are all STABLE. Do not need to make any changes to his treatment plan at this time. Would like for him to keep up the good work and we can recheck in a year.  Jeffery Wood. Jerline Pain, MD 12/17/2019 8:06 AM

## 2019-12-18 ENCOUNTER — Other Ambulatory Visit: Payer: Self-pay

## 2019-12-18 ENCOUNTER — Encounter: Payer: Self-pay | Admitting: Family Medicine

## 2019-12-18 MED ORDER — AZITHROMYCIN 250 MG PO TABS: ORAL_TABLET | ORAL | 0 refills | Status: DC

## 2019-12-18 MED ORDER — AZELASTINE HCL 0.1 % NA SOLN: 2.0000 | Freq: Two times a day (BID) | NASAL | 12 refills | Status: DC

## 2020-01-01 ENCOUNTER — Other Ambulatory Visit (HOSPITAL_COMMUNITY): Payer: Self-pay | Admitting: Internal Medicine

## 2020-01-05 ENCOUNTER — Other Ambulatory Visit: Payer: Self-pay | Admitting: Family Medicine

## 2020-01-12 LAB — HM DIABETES EYE EXAM

## 2020-01-21 ENCOUNTER — Encounter: Payer: Self-pay | Admitting: Family Medicine

## 2020-02-17 ENCOUNTER — Other Ambulatory Visit: Payer: Self-pay | Admitting: Family Medicine

## 2020-03-02 ENCOUNTER — Other Ambulatory Visit: Payer: Self-pay

## 2020-03-02 ENCOUNTER — Inpatient Hospital Stay: Payer: BC Managed Care – PPO | Attending: Oncology | Admitting: Oncology

## 2020-03-02 VITALS — BP 153/91 | HR 84 | Temp 98.1°F | Resp 20 | Ht 75.0 in | Wt 249.7 lb

## 2020-03-02 DIAGNOSIS — Z87891 Personal history of nicotine dependence: Secondary | ICD-10-CM | POA: Diagnosis not present

## 2020-03-02 DIAGNOSIS — J449 Chronic obstructive pulmonary disease, unspecified: Secondary | ICD-10-CM | POA: Diagnosis not present

## 2020-03-02 DIAGNOSIS — M545 Low back pain, unspecified: Secondary | ICD-10-CM | POA: Diagnosis not present

## 2020-03-02 DIAGNOSIS — Z79899 Other long term (current) drug therapy: Secondary | ICD-10-CM | POA: Insufficient documentation

## 2020-03-02 DIAGNOSIS — M109 Gout, unspecified: Secondary | ICD-10-CM | POA: Insufficient documentation

## 2020-03-02 DIAGNOSIS — K5903 Drug induced constipation: Secondary | ICD-10-CM | POA: Diagnosis not present

## 2020-03-02 DIAGNOSIS — T451X5A Adverse effect of antineoplastic and immunosuppressive drugs, initial encounter: Secondary | ICD-10-CM | POA: Diagnosis not present

## 2020-03-02 DIAGNOSIS — R61 Generalized hyperhidrosis: Secondary | ICD-10-CM | POA: Insufficient documentation

## 2020-03-02 DIAGNOSIS — C7951 Secondary malignant neoplasm of bone: Secondary | ICD-10-CM | POA: Diagnosis not present

## 2020-03-02 DIAGNOSIS — C8330 Diffuse large B-cell lymphoma, unspecified site: Secondary | ICD-10-CM | POA: Diagnosis not present

## 2020-03-02 DIAGNOSIS — C8520 Mediastinal (thymic) large B-cell lymphoma, unspecified site: Secondary | ICD-10-CM | POA: Insufficient documentation

## 2020-03-02 DIAGNOSIS — L03039 Cellulitis of unspecified toe: Secondary | ICD-10-CM | POA: Insufficient documentation

## 2020-03-02 DIAGNOSIS — E119 Type 2 diabetes mellitus without complications: Secondary | ICD-10-CM | POA: Diagnosis not present

## 2020-03-02 DIAGNOSIS — G8929 Other chronic pain: Secondary | ICD-10-CM | POA: Insufficient documentation

## 2020-03-02 NOTE — Progress Notes (Signed)
[facility] OFFICE PROGRESS NOTE   Diagnosis: Non-Hodgkin's lymphoma  INTERVAL HISTORY:   Jeffery Wood returns as scheduled.  Good appetite.  No dyspnea.  He reports significant improvement in dyspnea with the Spiriva inhaler.  He had symptoms of a "cold "in November.  This resolved after taking a "Z-Pak ".  He has noted soreness in the right submandibular area beginning in November.  Occasional night sweats that he relates to a heating blanket. He had recent pain in the right shoulder.  This has resolved. His chief complaint is lower back pain radiating into the legs.  This has been worse for several months.  He fell several times on the "ice ".  Jeffery Wood has received the COVID-19 and influenza vaccines.  Objective:  Vital signs in last 24 hours:  Blood pressure (!) 153/91, pulse 84, temperature 98.1 F (36.7 C), temperature source Tympanic, resp. rate 20, height 6' 3"  (1.905 m), weight 249 lb 11.2 oz (113.3 kg), SpO2 99 %.    HEENT: Oropharynx without visible mass, neck without mass.  There is prominence of the right submandibular gland versus a 2 cm mobile lymph node. Lymphatics: No cervical, supraclavicular, axillary, or inguinal nodes. Resp: Lungs clear bilaterally regular Cardio: Regular rate and rhythm GI: No hepatosplenomegaly, nontender, no mass Vascular: No leg edema Neuro: Strength is intact with dorsiflexion at the feet bilaterally   Portacath/PICC-without erythema  Lab Results:  Lab Results  Component Value Date   WBC 7.0 12/16/2019   HGB 16.2 12/16/2019   HCT 48.8 12/16/2019   MCV 88.6 12/16/2019   PLT 228 12/16/2019   NEUTROABS 4.2 12/02/2018    CMP  Lab Results  Component Value Date   NA 138 12/16/2019   K 4.6 12/16/2019   CL 102 12/16/2019   CO2 28 12/16/2019   GLUCOSE 129 (H) 12/16/2019   BUN 11 12/16/2019   CREATININE 0.70 12/16/2019   CALCIUM 9.5 12/16/2019   PROT 6.4 12/16/2019   ALBUMIN 4.4 12/02/2018   AST 15 12/16/2019    ALT 13 12/16/2019   ALKPHOS 79 12/02/2018   BILITOT 0.7 12/16/2019   GFRNONAA >60 01/18/2018   GFRAA >60 01/18/2018     Medications: I have reviewed the patient's current medications.   Assessment/Plan: 1. High-grade B-cell non-Hodgkin's lymphoma, molecular studies pending, clinical stage IV, IPI-low intermediate risk, FISH panel revealed an 8q24/14q85fsion (MYC/IgH), negative for BCL 6 and BCL-2  Chest x-ray 01/15/2017-medial right upper lobe/perihilar mass.   Chest CT 01/19/2017-mass constricting the superior vena cava, subcarinal adenopathy, right suprahilar/mediastinal mass.   12/26/2018status postbronchoscopy with transbronchial biopsy of level 7node and biopsy of aright upper lobe lung mass by Dr. GServando Snare There was compression of the right mainstem bronchus. A mass was noted in the right upper lobe. Mediastinal lymph nodes were seen on EBUS. Multiple biopsies of a level 7 node were obtained. Brushings and a biopsy of the right upper lobe mass were obtained. 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 Final pathology was nondiagnostic.   PET scan on 02/02/2017 showed hypermetabolism corresponding to the right sided mediastinal mass,progressive since the CT 01/19/2017. Metastatic disease/lymphoma within the bones, pericardium, stomach, bowel and abdominopelvic nodal stations.   Brain MRI 02/03/2017 showed no evidence of intracranial metastases or acute abnormality.   1/7/2019status postmediastinoscopy.  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 Molecular studies are pending.  Rituximab 02/08/2017  Cycle 1 CHOP 02/09/2017  Cycle 1 R-EPOCH 03/02/2017  Cycle2 R-EPOCH2/22/2019  Prophylactic intrathecal  methotrexate 03/27/2017  Restaging PET scan 04/11/2017-significant decrease in FDG uptake associated with the chest, abdomen and pelvis and axial and proximal appendicular skeleton. No new foci of abnormal  radiotracer uptake identified.  Cycle 3R-EPOCH3/15/2019with intrathecal methotrexate prophylaxis  Cycle 4R-EPOCH4/5/2019with intrathecal methotrexate prophylaxis  Cycle 5 R-EPOCH4/26/2019with intrathecal methotrexate prophylaxis  2. Chest/upper backand right armpain secondary to #1- resolved. 3. Exertional dyspnea secondary to #1 and COPD.Improved. 4. Asthma 5. Diabetes mellitus 6. Gout 7. Remote history of "carcinoid syndrome" 8. Remote history of tobacco use 9. Basal cell carcinoma removed from the right face 10. Chronic low back pain status post epidural steroid injections-followed at a pain management clinic  L4-5 decompression and fusion 01/17/2018 11. 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 Constipation secondary to narcotic analgesics and potentially lymphoma involving the GI tract-improved. 13. Right first toenail infection 06/19/2017- prescribeddoxycycline 14. 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    Disposition: Jeffery Wood is in clinical remission from [place].  He is now 3 years out from diagnosis.  There is slight prominence of the right submandibular gland versus a small lymph node.  I suspect this is a benign finding, potentially related to a recent upper respiratory infection.  He will return for a repeat exam in 3 months.  Jeffery Wood will follow up with Dr. Jerline Pain and Dr. Arnoldo Morale for evaluation and management of back pain.  Jeffery Coder, MD  03/02/2020  8:37 AM

## 2020-03-03 ENCOUNTER — Encounter: Payer: Self-pay | Admitting: Family Medicine

## 2020-03-03 ENCOUNTER — Telehealth: Payer: Self-pay | Admitting: Oncology

## 2020-03-03 ENCOUNTER — Other Ambulatory Visit (HOSPITAL_COMMUNITY): Payer: Self-pay | Admitting: Student

## 2020-03-03 NOTE — Telephone Encounter (Signed)
Scheduled appointment per 2/1 los. Called patient, no answer. Left message with appointment date and time.

## 2020-03-04 ENCOUNTER — Other Ambulatory Visit: Payer: Self-pay | Admitting: Family Medicine

## 2020-03-04 ENCOUNTER — Ambulatory Visit: Payer: BC Managed Care – PPO | Admitting: Family Medicine

## 2020-03-04 ENCOUNTER — Encounter: Payer: Self-pay | Admitting: Family Medicine

## 2020-03-04 ENCOUNTER — Other Ambulatory Visit: Payer: Self-pay

## 2020-03-04 ENCOUNTER — Ambulatory Visit (INDEPENDENT_AMBULATORY_CARE_PROVIDER_SITE_OTHER): Payer: BC Managed Care – PPO

## 2020-03-04 VITALS — BP 134/76 | HR 83 | Temp 97.4°F | Ht 75.0 in | Wt 251.8 lb

## 2020-03-04 DIAGNOSIS — M545 Low back pain, unspecified: Secondary | ICD-10-CM

## 2020-03-04 DIAGNOSIS — M25552 Pain in left hip: Secondary | ICD-10-CM | POA: Diagnosis not present

## 2020-03-04 DIAGNOSIS — G8929 Other chronic pain: Secondary | ICD-10-CM | POA: Diagnosis not present

## 2020-03-04 MED ORDER — HYDROCODONE-ACETAMINOPHEN 5-325 MG PO TABS: 1.0000 | ORAL_TABLET | Freq: Four times a day (QID) | ORAL | 0 refills | Status: DC | PRN

## 2020-03-04 MED ORDER — METHYLPREDNISOLONE ACETATE 80 MG/ML IJ SUSP
80.0000 mg | Freq: Once | INTRAMUSCULAR | Status: AC
  Administered 2020-03-04: 80 mg via INTRAMUSCULAR

## 2020-03-04 MED ORDER — TIZANIDINE HCL 4 MG PO TABS: 4.0000 mg | ORAL_TABLET | Freq: Four times a day (QID) | ORAL | 0 refills | Status: DC | PRN

## 2020-03-04 NOTE — Progress Notes (Signed)
   Jeffery Wood is a 65 y.o. male who presents today for an office visit.  Assessment/Plan:  Chronic Problems Addressed Today: Chronic low back pain With acute flare. Seems to be most consistent with piriformis syndrome.  He has quite a bit of tenderness and left upper gluteal area. Thankfully the remainder of his msk and neuro exam are relatively normal. He does have a complicated history including history of non-Hodgkin's lymphoma and known lumbar spondylolisthesis following with neurosurgery.  He does not want to do prednisone at the time being.  We'll give 80 mg of IM Depo-Medrol today.  Will start Zanaflex.  Also give very small supply of Norco to use as needed.  Discussed home exercises.  Can use heating pad as needed as well.  He will need to follow-up with neurosurgery soon if not improving.  Given his complicated history will check plain films today to rule out fracture or other possible bony pathologies.     Subjective:  HPI:  Patient with left lower back pain that is worsened over the last month.  Has a history of chronic back pain and has seen neurosurgery pain management in the past for this.  He also has a history of non-Hodgkin's lymphoma and is concerned about possible recurrence.  He has slipped multiple times on ice over the last month or so which is worsened symptoms.  Pain radiates from the left lower back/left upper buttocks into left knee.  He has tried taking tramadol with modest improvement.  Symptoms seem to be worse depending on position.  He has also noticed some weakness in left leg.  He has a history of neuropathy in bilateral feet which seems to be worsening.       Objective:  Physical Exam: BP 134/76   Pulse 83   Temp (!) 97.4 F (36.3 C) (Temporal)   Ht 6\' 3"  (1.905 m)   Wt 251 lb 12.8 oz (114.2 kg)   SpO2 97%   BMI 31.47 kg/m   Gen: No acute distress, resting comfortably MSK: -Back: No deformities.  No midline tenderness.  No tenderness with  palpation or percussion of spinous processes.  Tenderness to palpation and left upper gluteal area. -Legs: No deformities.  Strength out of 5 throughout.  Sensation to light touch intact throughout.  Straight leg raise negative. Neuro: Grossly normal, moves all extremities Psych: Normal affect and thought content      Caleb M. Jerline Pain, MD 03/04/2020 1:38 PM

## 2020-03-04 NOTE — Patient Instructions (Signed)
It was very nice to see you today!  I think you have a muscle strain in your back.  Please take the muscle relaxer.  You can use the Norco as needed.  Please follow-up with your neurosurgeon soon if symptoms not improving.  We will check an x-ray today to make sure you do not have any recurrence of your cancer or anything else going on.  We'll give you an injection of cortisone today.  Please let me know if symptoms are not improving in the last 1-2 weeks.  Take care, Dr Jerline Pain  Please try these tips to maintain a healthy lifestyle:   Eat at least 3 REAL meals and 1-2 snacks per day.  Aim for no more than 5 hours between eating.  If you eat breakfast, please do so within one hour of getting up.    Each meal should contain half fruits/vegetables, one quarter protein, and one quarter carbs (no bigger than a computer mouse)   Cut down on sweet beverages. This includes juice, soda, and sweet tea.     Drink at least 1 glass of water with each meal and aim for at least 8 glasses per day   Exercise at least 150 minutes every week.

## 2020-03-04 NOTE — Addendum Note (Signed)
Addended by: Betti Cruz on: 03/04/2020 02:12 PM   Modules accepted: Orders

## 2020-03-04 NOTE — Assessment & Plan Note (Addendum)
With acute flare. Seems to be most consistent with piriformis syndrome.  He has quite a bit of tenderness and left upper gluteal area. Thankfully the remainder of his msk and neuro exam are relatively normal. He does have a complicated history including history of non-Hodgkin's lymphoma and known lumbar spondylolisthesis following with neurosurgery.  He does not want to do prednisone at the time being.  We'll give 80 mg of IM Depo-Medrol today.  Will start Zanaflex.  Also give very small supply of Norco to use as needed.  Discussed home exercises.  Can use heating pad as needed as well.  He will need to follow-up with neurosurgery soon if not improving.  Given his complicated history will check plain films today to rule out fracture or other possible bony pathologies.

## 2020-03-05 NOTE — Progress Notes (Signed)
Please inform patient of the following:  Xrays show arthritis and degenerative changes but no signs of fracture or cancer. His pain should get better with the treatment plan we discussed. Would like for him to let us know if not getting better.  Algis Greenhouse. Jerline Pain, MD 03/05/2020 8:08 AM

## 2020-03-15 ENCOUNTER — Other Ambulatory Visit: Payer: Self-pay | Admitting: Family Medicine

## 2020-03-15 ENCOUNTER — Telehealth: Payer: Self-pay

## 2020-03-15 NOTE — Telephone Encounter (Signed)
  LAST APPOINTMENT DATE: 03/04/20  NEXT APPOINTMENT DATE:@Visit  date not found  MEDICATION:HYDROcodone-acetaminophen (NORCO/VICODIN) 5-325 MG tablet  PHARMACY:[facility] at [facility], [place]

## 2020-03-16 ENCOUNTER — Other Ambulatory Visit: Payer: Self-pay | Admitting: Family Medicine

## 2020-03-16 MED ORDER — HYDROCODONE-ACETAMINOPHEN 5-325 MG PO TABS: 1.0000 | ORAL_TABLET | Freq: Four times a day (QID) | ORAL | 0 refills | Status: DC | PRN

## 2020-03-16 NOTE — Telephone Encounter (Signed)
Patient requesting Rx Hydrocodone and Rx Zanaflex refill  Last refilled on 03/04/2020

## 2020-03-16 NOTE — Addendum Note (Signed)
Addended by: [name] on: 03/16/2020 03:42 PM   Modules accepted: Orders

## 2020-03-17 NOTE — Telephone Encounter (Signed)
See note

## 2020-03-30 ENCOUNTER — Other Ambulatory Visit: Payer: Self-pay | Admitting: Family Medicine

## 2020-04-06 ENCOUNTER — Other Ambulatory Visit: Payer: Self-pay | Admitting: Family Medicine

## 2020-04-14 ENCOUNTER — Other Ambulatory Visit: Payer: Self-pay | Admitting: Family Medicine

## 2020-04-14 NOTE — Telephone Encounter (Signed)
Pt requesting refill on klonopin 0.5 mg. Last OV 03/04/2020.

## 2020-04-26 ENCOUNTER — Telehealth: Payer: Self-pay | Admitting: Oncology

## 2020-04-26 NOTE — Telephone Encounter (Signed)
Calendar & letter has been mailed to the patient with updated address for appointments at [facility]  

## 2020-05-03 ENCOUNTER — Other Ambulatory Visit (HOSPITAL_COMMUNITY): Payer: Self-pay

## 2020-05-18 ENCOUNTER — Other Ambulatory Visit (HOSPITAL_COMMUNITY): Payer: Self-pay

## 2020-05-18 MED FILL — Gabapentin Cap 300 MG: ORAL | 90 days supply | Qty: 540 | Fill #0 | Status: AC

## 2020-05-18 MED FILL — Tamsulosin HCl Cap 0.4 MG: ORAL | 90 days supply | Qty: 90 | Fill #0 | Status: AC

## 2020-05-18 MED FILL — Duloxetine HCl Enteric Coated Pellets Cap 30 MG (Base Eq): ORAL | 90 days supply | Qty: 90 | Fill #0 | Status: AC

## 2020-05-25 ENCOUNTER — Other Ambulatory Visit (HOSPITAL_COMMUNITY): Payer: Self-pay

## 2020-05-25 MED FILL — Tiotropium Bromide Monohydrate Inhal Cap 18 MCG (Base Equiv): RESPIRATORY_TRACT | 30 days supply | Qty: 30 | Fill #0 | Status: AC

## 2020-05-27 ENCOUNTER — Telehealth: Payer: Self-pay

## 2020-05-27 NOTE — Telephone Encounter (Signed)
MEDICATION: tiZANidine (ZANAFLEX) 4 MG tablet   PHARMACY: [facility] at [facility]   Comments: Pt asked for refill due to his back inflammation  **Let patient know to contact pharmacy at the end of the day to make sure medication is ready. **  ** Please notify patient to allow 48-72 hours to process**  **Encourage patient to contact the pharmacy for refills or they can request refills through [facility]**    Patient also asked if Dr. Jerline Pain would approve of his wife getting scheduled as a new patient. Her previous PCP has moved and she needs a new one. Please advise.

## 2020-05-28 ENCOUNTER — Other Ambulatory Visit (HOSPITAL_COMMUNITY): Payer: Self-pay

## 2020-05-28 ENCOUNTER — Other Ambulatory Visit: Payer: Self-pay | Admitting: *Deleted

## 2020-05-28 MED ORDER — TIZANIDINE HCL 4 MG PO TABS
ORAL_TABLET | Freq: Four times a day (QID) | ORAL | 0 refills | Status: DC | PRN
  Filled 2020-05-28: qty 30, 8d supply, fill #0

## 2020-05-28 NOTE — Telephone Encounter (Signed)
Rx send to pharmacy  

## 2020-06-01 ENCOUNTER — Telehealth: Payer: Self-pay

## 2020-06-01 ENCOUNTER — Inpatient Hospital Stay: Payer: BC Managed Care – PPO | Attending: Oncology | Admitting: Oncology

## 2020-06-01 ENCOUNTER — Other Ambulatory Visit: Payer: Self-pay

## 2020-06-01 ENCOUNTER — Ambulatory Visit: Payer: BC Managed Care – PPO | Admitting: Oncology

## 2020-06-01 ENCOUNTER — Ambulatory Visit: Payer: BC Managed Care – PPO | Admitting: Nurse Practitioner

## 2020-06-01 VITALS — BP 142/81 | HR 86 | Temp 98.1°F | Resp 20 | Ht 75.0 in | Wt 256.4 lb

## 2020-06-01 DIAGNOSIS — M545 Low back pain, unspecified: Secondary | ICD-10-CM | POA: Diagnosis not present

## 2020-06-01 DIAGNOSIS — G8929 Other chronic pain: Secondary | ICD-10-CM | POA: Diagnosis not present

## 2020-06-01 DIAGNOSIS — E119 Type 2 diabetes mellitus without complications: Secondary | ICD-10-CM | POA: Insufficient documentation

## 2020-06-01 DIAGNOSIS — M109 Gout, unspecified: Secondary | ICD-10-CM

## 2020-06-01 DIAGNOSIS — K5903 Drug induced constipation: Secondary | ICD-10-CM | POA: Diagnosis not present

## 2020-06-01 DIAGNOSIS — T451X5A Adverse effect of antineoplastic and immunosuppressive drugs, initial encounter: Secondary | ICD-10-CM | POA: Diagnosis not present

## 2020-06-01 DIAGNOSIS — C8330 Diffuse large B-cell lymphoma, unspecified site: Secondary | ICD-10-CM

## 2020-06-01 DIAGNOSIS — Z79899 Other long term (current) drug therapy: Secondary | ICD-10-CM | POA: Diagnosis not present

## 2020-06-01 DIAGNOSIS — M25511 Pain in right shoulder: Secondary | ICD-10-CM | POA: Insufficient documentation

## 2020-06-01 DIAGNOSIS — J45909 Unspecified asthma, uncomplicated: Secondary | ICD-10-CM | POA: Diagnosis not present

## 2020-06-01 DIAGNOSIS — Z87891 Personal history of nicotine dependence: Secondary | ICD-10-CM | POA: Insufficient documentation

## 2020-06-01 DIAGNOSIS — C7951 Secondary malignant neoplasm of bone: Secondary | ICD-10-CM | POA: Insufficient documentation

## 2020-06-01 DIAGNOSIS — M549 Dorsalgia, unspecified: Secondary | ICD-10-CM | POA: Diagnosis not present

## 2020-06-01 DIAGNOSIS — C8520 Mediastinal (thymic) large B-cell lymphoma, unspecified site: Secondary | ICD-10-CM | POA: Insufficient documentation

## 2020-06-01 DIAGNOSIS — J449 Chronic obstructive pulmonary disease, unspecified: Secondary | ICD-10-CM | POA: Insufficient documentation

## 2020-06-01 NOTE — Addendum Note (Signed)
Addended by: Lenox Ponds E on: 06/01/2020 05:57 PM   Modules accepted: Orders

## 2020-06-01 NOTE — Telephone Encounter (Signed)
Medication list updated cymbalta left on list until Pt confirms mg of cymbalta he is taking.

## 2020-06-01 NOTE — Progress Notes (Signed)
[facility] OFFICE PROGRESS NOTE   Diagnosis: Non-Hodgkin's lymphoma  INTERVAL HISTORY:   Jeffery Wood returns for a scheduled visit.  He has intermittent pain in the right shoulder and back.  He is now using tizanidine as needed.  No change in the right submandibular "lymph node ".  No fever or night sweats.  Good appetite.  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  Objective:  Vital signs in last 24 hours:  Blood pressure (!) 142/81, pulse 86, temperature 98.1 F (36.7 C), temperature source Oral, resp. rate 20, height 6' 3"  (1.905 m), weight 256 lb 6.4 oz (116.3 kg), SpO2 98 %.    HEENT: Oropharynx without visible mass, 2 cm soft mobile fullness in the right submandibular/high anterior cervical region Lymphatics: No other cervical, supraclavicular, axillary, or inguinal nodes Resp: Lungs clear bilaterally Cardio: Regular rate and rhythm GI: No hepatosplenomegaly, nontender, no mass Vascular: No leg edema    Lab Results:  Lab Results  Component Value Date   WBC 7.0 12/16/2019   HGB 16.2 12/16/2019   HCT 48.8 12/16/2019   MCV 88.6 12/16/2019   PLT 228 12/16/2019   NEUTROABS 4.2 12/02/2018    CMP  Lab Results  Component Value Date   NA 138 12/16/2019   K 4.6 12/16/2019   CL 102 12/16/2019   CO2 28 12/16/2019   GLUCOSE 129 (H) 12/16/2019   BUN 11 12/16/2019   CREATININE 0.70 12/16/2019   CALCIUM 9.5 12/16/2019   PROT 6.4 12/16/2019   ALBUMIN 4.4 12/02/2018   AST 15 12/16/2019   ALT 13 12/16/2019   ALKPHOS 79 12/02/2018   BILITOT 0.7 12/16/2019   GFRNONAA >60 01/18/2018   GFRAA >60 01/18/2018     Medications: I have reviewed the patient's current medications.   Assessment/Plan: 1. High-grade B-cell non-Hodgkin's lymphoma, molecular studies pending, clinical stage IV, IPI-low intermediate risk, FISH panel revealed an 8q24/14q91fsion (MYC/IgH), negative for BCL 6 and BCL-2  Chest x-ray 01/15/2017-medial right upper  lobe/perihilar mass.   Chest CT 01/19/2017-mass constricting the superior vena cava, subcarinal adenopathy, right suprahilar/mediastinal mass.   12/26/2018status postbronchoscopy with transbronchial biopsy of level 7node and biopsy of aright upper lobe lung mass by Dr. GServando Snare There was compression of the right mainstem bronchus. A mass was noted in the right upper lobe. Mediastinal lymph nodes were seen on EBUS. Multiple biopsies of a level 7 node were obtained. Brushings and a biopsy of the right upper lobe mass were obtained. 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 Final pathology was nondiagnostic.   PET scan on 02/02/2017 showed hypermetabolism corresponding to the right sided mediastinal mass,progressive since the CT 01/19/2017. Metastatic disease/lymphoma within the bones, pericardium, stomach, bowel and abdominopelvic nodal stations.   Brain MRI 02/03/2017 showed no evidence of intracranial metastases or acute abnormality.   1/7/2019status postmediastinoscopy.  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 Molecular studies are pending.  Rituximab 02/08/2017  Cycle 1 CHOP 02/09/2017  Cycle 1 R-EPOCH 03/02/2017  Cycle2 R-EPOCH2/22/2019  Prophylactic intrathecal methotrexate 03/27/2017  Restaging PET scan 04/11/2017-significant decrease in FDG uptake associated with the chest, abdomen and pelvis and axial and proximal appendicular skeleton. No new foci of abnormal radiotracer uptake identified.  Cycle 3R-EPOCH3/15/2019with intrathecal methotrexate prophylaxis  Cycle 4R-EPOCH4/5/2019with intrathecal methotrexate prophylaxis  Cycle 5 R-EPOCH4/26/2019with intrathecal methotrexate prophylaxis  2. Chest/upper backand right armpain secondary to #1- resolved. 3. Exertional dyspnea secondary to #1 and COPD.Improved. 4. Asthma 5. Diabetes mellitus  6. Gout 7. Remote history of "carcinoid syndrome" 8. Remote  history of tobacco use 9. Basal cell carcinoma removed from the right face 10. Chronic low back pain status post epidural steroid injections-followed at a pain management clinic  L4-5 decompression and fusion 01/17/2018 11. 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 Constipation secondary to narcotic analgesics and potentially lymphoma involving the GI tract-improved. 13. Right first toenail infection 06/19/2017- prescribeddoxycycline 14. 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   Disposition: [name] is in clinical remission from non-Hodgkin's lymphoma.  He is now greater than 3 years out from diagnosis.  There is a stable fullness in the right submandibular region, likely a benign enlarged lymph node following an upper respiratory infection late last year.  I have a low clinical suspicion for progression of lymphoma or another malignancy.  We discussed obtaining a CT.  We decided to continue observation.  He will return for an office visit in 3 months.  He will call in the interim for enlargement of the apparent lymph node or new symptoms.  He will continue follow-up with Dr. Jerline Pain for management of chronic back pain.  Betsy Coder, MD  06/01/2020  9:48 AM

## 2020-06-01 NOTE — Progress Notes (Deleted)
[place] OFFICE PROGRESS NOTE   Diagnosis:   INTERVAL HISTORY:   ***  Objective:  Vital signs in last 24 hours:  Blood pressure (!) 142/81, pulse 86, temperature 98.1 F (36.7 C), temperature source Oral, resp. rate 20, height 6' 3"  (1.905 m), weight 256 lb 6.4 oz (116.3 kg), SpO2 98 %.    HEENT: *** Lymphatics: *** Resp: *** Cardio: *** GI: *** Vascular: *** Neuro:***  Skin:***   Portacath/PICC-without erythema  Lab Results:  Lab Results  Component Value Date   WBC 7.0 12/16/2019   HGB 16.2 12/16/2019   HCT 48.8 12/16/2019   MCV 88.6 12/16/2019   PLT 228 12/16/2019   NEUTROABS 4.2 12/02/2018    CMP  Lab Results  Component Value Date   NA 138 12/16/2019   K 4.6 12/16/2019   CL 102 12/16/2019   CO2 28 12/16/2019   GLUCOSE 129 (H) 12/16/2019   BUN 11 12/16/2019   CREATININE 0.70 12/16/2019   CALCIUM 9.5 12/16/2019   PROT 6.4 12/16/2019   ALBUMIN 4.4 12/02/2018   AST 15 12/16/2019   ALT 13 12/16/2019   ALKPHOS 79 12/02/2018   BILITOT 0.7 12/16/2019   GFRNONAA >60 01/18/2018   GFRAA >60 01/18/2018    Medications: I have reviewed the patient's current medications.   Assessment/Plan: 1. High-grade B-cell non-Hodgkin's lymphoma, molecular studies pending, clinical stage IV, IPI-low intermediate risk, FISH panel revealed an 8q24/14q19fsion (MYC/IgH), negative for BCL 6 and BCL-2  Chest x-ray 01/15/2017-medial right upper lobe/perihilar mass.   Chest CT 01/19/2017-mass constricting the superior vena cava, subcarinal adenopathy, right suprahilar/mediastinal mass.   12/26/2018status postbronchoscopy with transbronchial biopsy of level 7node and biopsy of aright upper lobe lung mass by Dr. GServando Snare There was compression of the right mainstem bronchus. A mass was noted in the right upper lobe. Mediastinal lymph nodes were seen on EBUS. Multiple biopsies of a level 7 node were obtained. Brushings and a biopsy of the right  upper lobe mass were obtained. 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 Final pathology was nondiagnostic.   PET scan on 02/02/2017 showed hypermetabolism corresponding to the right sided mediastinal mass,progressive since the CT 01/19/2017. Metastatic disease/lymphoma within the bones, pericardium, stomach, bowel and abdominopelvic nodal stations.   Brain MRI 02/03/2017 showed no evidence of intracranial metastases or acute abnormality.   1/7/2019status postmediastinoscopy.  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 Molecular studies are pending.  Rituximab 02/08/2017  Cycle 1 CHOP 02/09/2017  Cycle 1 R-EPOCH 03/02/2017  Cycle2 R-EPOCH2/22/2019  Prophylactic intrathecal methotrexate 03/27/2017  Restaging PET scan 04/11/2017-significant decrease in FDG uptake associated with the chest, abdomen and pelvis and axial and proximal appendicular skeleton. No new foci of abnormal radiotracer uptake identified.  Cycle 3R-EPOCH3/15/2019with intrathecal methotrexate prophylaxis  Cycle 4R-EPOCH4/5/2019with intrathecal methotrexate prophylaxis  Cycle 5 R-EPOCH4/26/2019with intrathecal methotrexate prophylaxis  2. Chest/upper backand right armpain secondary to #1- resolved. 3. Exertional dyspnea secondary to #1 and COPD.Improved. 4. Asthma 5. Diabetes mellitus 6. Gout 7. Remote history of "carcinoid syndrome" 8. Remote history of tobacco use 9. Basal cell carcinoma removed from the right face 10. Chronic low back pain status post epidural steroid injections-followed at a pain management clinic  L4-5 decompression and fusion 01/17/2018 11. 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 Constipation secondary to narcotic analgesics and potentially lymphoma involving the GI tract-improved. 13. Right first toenail infection 06/19/2017- prescribeddoxycycline 14. Numbness/pain in the feet-vincristine  neuropathy?      Disposition: ***  GBetsy Coder  MD  06/01/2020  9:52 AM

## 2020-06-15 ENCOUNTER — Other Ambulatory Visit: Payer: Self-pay | Admitting: Student

## 2020-06-15 ENCOUNTER — Other Ambulatory Visit: Payer: Self-pay | Admitting: Family Medicine

## 2020-06-15 ENCOUNTER — Encounter: Payer: Self-pay | Admitting: Family Medicine

## 2020-06-15 ENCOUNTER — Other Ambulatory Visit (HOSPITAL_COMMUNITY): Payer: Self-pay

## 2020-06-15 MED ORDER — CLONAZEPAM 0.5 MG PO TABS
ORAL_TABLET | Freq: Every day | ORAL | 0 refills | Status: DC
  Filled 2020-06-15 – 2020-07-12 (×2): qty 90, 90d supply, fill #0

## 2020-06-15 MED FILL — Metformin HCl Tab 1000 MG: ORAL | 90 days supply | Qty: 180 | Fill #0 | Status: AC

## 2020-06-15 MED FILL — Omeprazole Cap Delayed Release 20 MG: ORAL | 90 days supply | Qty: 180 | Fill #0 | Status: AC

## 2020-06-17 NOTE — Telephone Encounter (Signed)
Please advise 

## 2020-06-21 ENCOUNTER — Other Ambulatory Visit: Payer: Self-pay | Admitting: Student

## 2020-06-21 ENCOUNTER — Other Ambulatory Visit (HOSPITAL_COMMUNITY): Payer: Self-pay

## 2020-06-25 ENCOUNTER — Encounter: Payer: Self-pay | Admitting: Oncology

## 2020-06-25 ENCOUNTER — Other Ambulatory Visit (HOSPITAL_COMMUNITY): Payer: Self-pay

## 2020-06-25 MED ORDER — TRAMADOL HCL 50 MG PO TABS
ORAL_TABLET | ORAL | 1 refills | Status: DC
  Filled 2020-06-25: qty 50, 17d supply, fill #0
  Filled 2020-08-09: qty 50, 17d supply, fill #1

## 2020-07-12 ENCOUNTER — Other Ambulatory Visit (HOSPITAL_COMMUNITY): Payer: Self-pay

## 2020-07-12 MED FILL — Celecoxib Cap 200 MG: ORAL | 90 days supply | Qty: 180 | Fill #0 | Status: AC

## 2020-07-12 MED FILL — Tiotropium Bromide Monohydrate Inhal Cap 18 MCG (Base Equiv): RESPIRATORY_TRACT | 30 days supply | Qty: 30 | Fill #1 | Status: AC

## 2020-07-13 ENCOUNTER — Other Ambulatory Visit: Payer: Self-pay

## 2020-07-22 ENCOUNTER — Other Ambulatory Visit (HOSPITAL_COMMUNITY): Payer: Self-pay

## 2020-07-22 MED ORDER — HYDROCODONE-ACETAMINOPHEN 5-325 MG PO TABS
ORAL_TABLET | ORAL | 0 refills | Status: DC
  Filled 2020-07-22: qty 20, 5d supply, fill #0

## 2020-08-09 ENCOUNTER — Other Ambulatory Visit (HOSPITAL_COMMUNITY): Payer: Self-pay

## 2020-08-09 ENCOUNTER — Encounter: Payer: Self-pay | Admitting: Oncology

## 2020-08-09 ENCOUNTER — Other Ambulatory Visit: Payer: Self-pay | Admitting: Family Medicine

## 2020-08-09 MED ORDER — TAMSULOSIN HCL 0.4 MG PO CAPS
ORAL_CAPSULE | Freq: Every day | ORAL | 1 refills | Status: DC
  Filled 2020-08-09: qty 90, 90d supply, fill #0
  Filled 2020-11-25: qty 90, 90d supply, fill #1
  Filled 2020-11-26: qty 90, 90d supply, fill #0

## 2020-08-09 MED ORDER — DULOXETINE HCL 30 MG PO CPEP
ORAL_CAPSULE | ORAL | 1 refills | Status: DC
  Filled 2020-08-09 – 2020-11-17 (×2): qty 90, 90d supply, fill #0

## 2020-08-09 MED ORDER — GABAPENTIN 300 MG PO CAPS
ORAL_CAPSULE | Freq: Three times a day (TID) | ORAL | 1 refills | Status: DC
  Filled 2020-08-09 – 2020-10-21 (×2): qty 540, 90d supply, fill #0
  Filled 2020-10-21: qty 540, 90d supply, fill #1

## 2020-08-09 MED FILL — Tiotropium Bromide Monohydrate Inhal Cap 18 MCG (Base Equiv): RESPIRATORY_TRACT | 30 days supply | Qty: 30 | Fill #2 | Status: AC

## 2020-08-10 ENCOUNTER — Other Ambulatory Visit (HOSPITAL_COMMUNITY): Payer: Self-pay

## 2020-09-01 ENCOUNTER — Inpatient Hospital Stay: Payer: BC Managed Care – PPO | Attending: Oncology | Admitting: Oncology

## 2020-09-01 ENCOUNTER — Other Ambulatory Visit: Payer: Self-pay

## 2020-09-01 VITALS — BP 152/99 | HR 80 | Temp 97.8°F | Resp 18 | Ht 75.0 in | Wt 252.8 lb

## 2020-09-01 DIAGNOSIS — Z79899 Other long term (current) drug therapy: Secondary | ICD-10-CM | POA: Insufficient documentation

## 2020-09-01 DIAGNOSIS — J449 Chronic obstructive pulmonary disease, unspecified: Secondary | ICD-10-CM | POA: Insufficient documentation

## 2020-09-01 DIAGNOSIS — L03039 Cellulitis of unspecified toe: Secondary | ICD-10-CM | POA: Insufficient documentation

## 2020-09-01 DIAGNOSIS — G8929 Other chronic pain: Secondary | ICD-10-CM | POA: Insufficient documentation

## 2020-09-01 DIAGNOSIS — R197 Diarrhea, unspecified: Secondary | ICD-10-CM | POA: Insufficient documentation

## 2020-09-01 DIAGNOSIS — C8330 Diffuse large B-cell lymphoma, unspecified site: Secondary | ICD-10-CM | POA: Diagnosis not present

## 2020-09-01 DIAGNOSIS — E119 Type 2 diabetes mellitus without complications: Secondary | ICD-10-CM | POA: Insufficient documentation

## 2020-09-01 DIAGNOSIS — C7951 Secondary malignant neoplasm of bone: Secondary | ICD-10-CM | POA: Diagnosis not present

## 2020-09-01 DIAGNOSIS — C8522 Mediastinal (thymic) large B-cell lymphoma, intrathoracic lymph nodes: Secondary | ICD-10-CM | POA: Insufficient documentation

## 2020-09-01 DIAGNOSIS — Z87891 Personal history of nicotine dependence: Secondary | ICD-10-CM | POA: Diagnosis not present

## 2020-09-01 DIAGNOSIS — M79601 Pain in right arm: Secondary | ICD-10-CM | POA: Insufficient documentation

## 2020-09-01 DIAGNOSIS — M19041 Primary osteoarthritis, right hand: Secondary | ICD-10-CM | POA: Diagnosis not present

## 2020-09-01 DIAGNOSIS — M549 Dorsalgia, unspecified: Secondary | ICD-10-CM | POA: Insufficient documentation

## 2020-09-01 DIAGNOSIS — K5903 Drug induced constipation: Secondary | ICD-10-CM | POA: Insufficient documentation

## 2020-09-01 DIAGNOSIS — M109 Gout, unspecified: Secondary | ICD-10-CM | POA: Insufficient documentation

## 2020-09-01 NOTE — Progress Notes (Signed)
[facility] OFFICE PROGRESS NOTE   Diagnosis: Non-Hodgkin's lymphoma  INTERVAL HISTORY:   [name] returns as scheduled.  He feels the nodule in the right submandibular area is smaller.  Good appetite.  He continues to have back pain.  He has developed "arthritis "at the right hand.  He has intermittent constipation and diarrhea.  Objective:  Vital signs in last 24 hours:  Blood pressure (!) 152/99, pulse 80, temperature 97.8 F (36.6 C), temperature source Oral, resp. rate 18, height 6' 3"  (1.905 m), weight 252 lb 12.8 oz (114.7 kg), SpO2 98 %.    HEENT: Oropharynx without visible mass, approximate 2 cm mobile nodular lesion in the right submandibular area. Lymphatics: No cervical, supraclavicular, axillary, or inguinal nodes Resp: Lungs clear bilaterally Cardio: Regular rate and rhythm GI: No hepatosplenomegaly Vascular: No leg edema   Lab Results:  Lab Results  Component Value Date   WBC 7.0 12/16/2019   HGB 16.2 12/16/2019   HCT 48.8 12/16/2019   MCV 88.6 12/16/2019   PLT 228 12/16/2019   NEUTROABS 4.2 12/02/2018    CMP  Lab Results  Component Value Date   NA 138 12/16/2019   K 4.6 12/16/2019   CL 102 12/16/2019   CO2 28 12/16/2019   GLUCOSE 129 (H) 12/16/2019   BUN 11 12/16/2019   CREATININE 0.70 12/16/2019   CALCIUM 9.5 12/16/2019   PROT 6.4 12/16/2019   ALBUMIN 4.4 12/02/2018   AST 15 12/16/2019   ALT 13 12/16/2019   ALKPHOS 79 12/02/2018   BILITOT 0.7 12/16/2019   GFRNONAA >60 01/18/2018   GFRAA >60 01/18/2018    No results found for: CEA1, CEA, CAN199, CA125  Lab Results  Component Value Date   INR 1.05 02/05/2017   LABPROT 13.6 02/05/2017    Imaging:  No results found.  Medications: I have reviewed the patient's current medications.   Assessment/Plan: High-grade B-cell non-Hodgkin's lymphoma, molecular studies pending, clinical stage IV, IPI- low intermediate risk, FISH panel revealed an [id number] fusion (MYC/IgH),  negative for BCL 6 and BCL-2 Chest x-ray 01/15/2017 - medial right upper lobe/perihilar mass.   Chest CT 01/19/2017 - mass constricting the superior vena cava, subcarinal adenopathy, right suprahilar/mediastinal mass.   01/24/2017 status post bronchoscopy with transbronchial biopsy of level 7 node and biopsy of a right upper lobe lung mass by Dr. Servando Snare.  There was compression of the right mainstem bronchus.  A mass was noted in the right upper lobe.  Mediastinal lymph nodes were seen on EBUS.  Multiple biopsies of a level 7 node were obtained.  Brushings and a biopsy of the right upper lobe mass were obtained.  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  Final pathology was nondiagnostic.   PET scan on 02/02/2017 showed hypermetabolism corresponding to the right sided mediastinal mass, progressive since the CT 01/19/2017.  Metastatic disease/lymphoma within the bones, pericardium, stomach, bowel and abdominopelvic nodal stations.   Brain MRI 02/03/2017 showed no evidence of intracranial metastases or acute abnormality.   02/05/2017 status post mediastinoscopy.   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  Molecular studies are pending. Rituximab 02/08/2017 Cycle 1 CHOP 02/09/2017 Cycle 1 R-EPOCH 03/02/2017 Cycle 2 R-EPOCH 03/23/2017 Prophylactic intrathecal methotrexate 03/27/2017 Restaging PET scan 04/11/2017-significant decrease in FDG uptake associated with the chest, abdomen and pelvis and axial and proximal appendicular skeleton.  No new foci of abnormal radiotracer uptake identified. Cycle 3 R-EPOCH 04/13/2017 with intrathecal methotrexate prophylaxis Cycle 4 R-EPOCH 05/04/2017  with intrathecal methotrexate prophylaxis Cycle 5 R- EPOCH 05/25/2017 with intrathecal methotrexate prophylaxis   Chest/upper back and right arm pain secondary to #1- resolved. Exertional dyspnea secondary to #1 and COPD.  Improved. Asthma Diabetes mellitus Gout Remote history of  "carcinoid syndrome" Remote history of tobacco use Basal cell carcinoma removed from the right face Chronic low back pain status post epidural steroid injections-followed at a pain management clinic L4-5 decompression and fusion 01/17/2018 SVC syndrome secondary to #1- resolved Constipation secondary to narcotic analgesics and potentially lymphoma involving the GI tract-improved. 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    Disposition: [name] appears stable.  He remains in clinical remission from non-Hodgkin's lymphoma.  The mobile lesion in the right submandibular area appears slightly smaller compared to when I saw him 3 months ago.  He will check this area every few weeks and contact us for enlargement.  He will return for an office visit in 6 months.  He will follow-up with Dr. Jerline Pain for evaluation of the constipation/diarrhea.  Betsy Coder, MD  09/01/2020  8:58 AM

## 2020-09-03 ENCOUNTER — Other Ambulatory Visit: Payer: Self-pay | Admitting: Gastroenterology

## 2020-09-03 DIAGNOSIS — R634 Abnormal weight loss: Secondary | ICD-10-CM

## 2020-09-03 DIAGNOSIS — R194 Change in bowel habit: Secondary | ICD-10-CM

## 2020-09-07 ENCOUNTER — Other Ambulatory Visit (HOSPITAL_COMMUNITY): Payer: Self-pay

## 2020-09-07 ENCOUNTER — Encounter: Payer: Self-pay | Admitting: Oncology

## 2020-09-07 ENCOUNTER — Encounter: Payer: Self-pay | Admitting: Family Medicine

## 2020-09-07 ENCOUNTER — Ambulatory Visit (INDEPENDENT_AMBULATORY_CARE_PROVIDER_SITE_OTHER): Payer: BC Managed Care – PPO | Admitting: Family Medicine

## 2020-09-07 ENCOUNTER — Other Ambulatory Visit: Payer: Self-pay

## 2020-09-07 VITALS — BP 122/73 | HR 82 | Temp 98.4°F | Ht 75.0 in | Wt 253.0 lb

## 2020-09-07 DIAGNOSIS — M792 Neuralgia and neuritis, unspecified: Secondary | ICD-10-CM | POA: Diagnosis not present

## 2020-09-07 DIAGNOSIS — M79605 Pain in left leg: Secondary | ICD-10-CM

## 2020-09-07 DIAGNOSIS — M545 Low back pain, unspecified: Secondary | ICD-10-CM

## 2020-09-07 DIAGNOSIS — M2010 Hallux valgus (acquired), unspecified foot: Secondary | ICD-10-CM | POA: Diagnosis not present

## 2020-09-07 DIAGNOSIS — G8929 Other chronic pain: Secondary | ICD-10-CM

## 2020-09-07 MED ORDER — TIZANIDINE HCL 4 MG PO TABS
ORAL_TABLET | Freq: Four times a day (QID) | ORAL | 0 refills | Status: DC | PRN
  Filled 2020-09-07: qty 30, 7d supply, fill #0

## 2020-09-07 MED ORDER — HYDROCODONE-ACETAMINOPHEN 5-325 MG PO TABS
ORAL_TABLET | ORAL | 0 refills | Status: DC
  Filled 2020-09-07: qty 20, 7d supply, fill #0

## 2020-09-07 NOTE — Assessment & Plan Note (Signed)
We will continue with watchful waiting.  Discussed importance of supportive footwear.  May need referral to podiatry if continues to be an issue.

## 2020-09-07 NOTE — Patient Instructions (Signed)
It was very nice to see you today!  You have a hamstring strain.  We will check blood work to make sure that you do not have a blood clot.  Please work on the exercises.  Let me know if not improving over the next several weeks.  Take care, Dr Jerline Pain  PLEASE NOTE:  If you had any lab tests please let us know if you have not heard back within a few days. You may see your results on mychart before we have a chance to review them but we will give you a call once they are reviewed by [place]. If we ordered any referrals today, please let us know if you have not heard from their office within the next week.   Please try these tips to maintain a healthy lifestyle:  Eat at least 3 REAL meals and 1-2 snacks per day.  Aim for no more than 5 hours between eating.  If you eat breakfast, please do so within one hour of getting up.   Each meal should contain half fruits/vegetables, one quarter protein, and one quarter carbs (no bigger than a computer mouse)  Cut down on sweet beverages. This includes juice, soda, and sweet tea.   Drink at least 1 glass of water with each meal and aim for at least 8 glasses per day  Exercise at least 150 minutes every week.

## 2020-09-07 NOTE — Assessment & Plan Note (Signed)
Has quite a bit of peripheral neuropathy.  We will continue gabapentin 300 mg 3 times daily and Cymbalta 30 mg daily.

## 2020-09-07 NOTE — Progress Notes (Signed)
   Jeffery Wood is a 65 y.o. male who presents today for an office visit.  Assessment/Plan:  New/Acute Problems: Left leg pain Exam consistent with hamstring strain/tear.  He has good strength and is neurovascular intact distally.  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  We will check D-dimer to rule out DVT though this is relatively low suspicion due to and likely being a hamstring strain.  He has been taking Zanaflex and Norco which has worked.  We will refill these.  Discussed referral to PT however he deferred.  We will give home exercise handout.  He will follow-up in a few weeks if not improving and may need referral to sports medicine.  Chronic Problems Addressed Today: Chronic low back pain Symptoms have been better though still has occasional flareups.  We will refill his Norco.  If continues to have issues may need referral to sports medicine.  Deferred PT referral.  Neuropathic pain Has quite a bit of peripheral neuropathy.  We will continue gabapentin 300 mg 3 times daily and Cymbalta 30 mg daily.  Hallux valgus We will continue with watchful waiting.  Discussed importance of supportive footwear.  May need referral to podiatry if continues to be an issue.     Subjective:  HPI:  He complains of swelling in the left leg that started last Friday. He states it started hurting in the morning after getting out from the shower.  No obvious injuries or precipitating events.  He had quite a bit of swelling to the area.  This is improved over the last couple of days.  Wife was concerned he may have a blood clot.  He notes his left leg is starting to turn purple and larger.  He has a history of neuropathy in bilateral feet.  No injuries or trauma.        Objective:  Physical Exam: BP 122/73   Pulse 82   Temp 98.4 F (36.9 C) (Temporal)   Ht '6\' 3"'$  (1.905 m)   Wt 253 lb (114.8 kg)   SpO2 97%   BMI 31.62 kg/m    Gen: No acute distress, resting comfortably MSK: Left posterior thigh with ecchymosis.  Pain elicited with resisted knee flexion on the left. Bunion on right foot.  Neuro: Grossly normal, moves all extremities Psych: Normal affect and thought content       I,Savera Zaman,acting as a scribe for Dimas Chyle, MD.,have documented all relevant documentation on the behalf of Dimas Chyle, MD,as directed by  Dimas Chyle, MD while in the presence of Dimas Chyle, MD.  I, Dimas Chyle, MD, have reviewed all documentation for this visit. The documentation on 09/07/20 for the exam, diagnosis, procedures, and orders are all accurate and complete.    Algis Greenhouse. Jerline Pain, MD 09/07/2020 3:24 PM

## 2020-09-07 NOTE — Assessment & Plan Note (Addendum)
Symptoms have been better though still has occasional flareups.  We will refill his Norco.  If continues to have issues may need referral to sports medicine.  Deferred PT referral.

## 2020-09-08 ENCOUNTER — Other Ambulatory Visit (HOSPITAL_COMMUNITY): Payer: Self-pay

## 2020-09-08 ENCOUNTER — Telehealth: Payer: Self-pay

## 2020-09-08 LAB — D-DIMER, QUANTITATIVE: D-Dimer, Quant: 0.48 mcg/mL FEU (ref <0.50)

## 2020-09-08 MED FILL — Omeprazole Cap Delayed Release 20 MG: ORAL | 90 days supply | Qty: 180 | Fill #1 | Status: AC

## 2020-09-08 NOTE — Telephone Encounter (Signed)
Patient calling back about lab results.  °

## 2020-09-08 NOTE — Progress Notes (Signed)
Please inform patient of the following:  Good news! His test for a blood clot is negative. He does NOT have a blood clot. Would like for him to work on the exercises we discussed at his office visit and let us know if his symptoms are not improving.

## 2020-09-09 ENCOUNTER — Other Ambulatory Visit (HOSPITAL_COMMUNITY): Payer: Self-pay

## 2020-09-09 NOTE — Telephone Encounter (Signed)
Called and lm for pt tcb regarding labs.

## 2020-09-21 ENCOUNTER — Encounter: Payer: Self-pay | Admitting: Oncology

## 2020-09-21 ENCOUNTER — Ambulatory Visit
Admission: RE | Admit: 2020-09-21 | Discharge: 2020-09-21 | Disposition: A | Discharge status: Home / Self Care | Payer: BC Managed Care – PPO | Source: Ambulatory Visit | Attending: Gastroenterology | Admitting: Gastroenterology

## 2020-09-21 ENCOUNTER — Other Ambulatory Visit: Payer: Self-pay

## 2020-09-21 DIAGNOSIS — R194 Change in bowel habit: Secondary | ICD-10-CM

## 2020-09-21 DIAGNOSIS — R634 Abnormal weight loss: Secondary | ICD-10-CM

## 2020-09-21 MED ORDER — IOPAMIDOL (ISOVUE-300) INJECTION 61%
100.0000 mL | Freq: Once | INTRAVENOUS | Status: AC | PRN
  Administered 2020-09-21: 100 mL via INTRAVENOUS

## 2020-09-22 ENCOUNTER — Other Ambulatory Visit: Payer: BC Managed Care – PPO

## 2020-09-22 ENCOUNTER — Other Ambulatory Visit (HOSPITAL_COMMUNITY): Payer: Self-pay

## 2020-09-22 MED FILL — Metformin HCl Tab 1000 MG: ORAL | 90 days supply | Qty: 180 | Fill #1 | Status: AC

## 2020-09-22 MED FILL — Metformin HCl Tab 1000 MG: ORAL | 90 days supply | Qty: 180 | Fill #1 | Status: CN

## 2020-09-23 ENCOUNTER — Other Ambulatory Visit (HOSPITAL_COMMUNITY): Payer: Self-pay

## 2020-09-24 ENCOUNTER — Other Ambulatory Visit (HOSPITAL_COMMUNITY): Payer: Self-pay

## 2020-09-28 ENCOUNTER — Other Ambulatory Visit (HOSPITAL_COMMUNITY): Payer: Self-pay

## 2020-09-28 MED ORDER — TRAMADOL HCL 50 MG PO TABS
ORAL_TABLET | ORAL | 1 refills | Status: DC
  Filled 2020-09-28: qty 50, 17d supply, fill #0
  Filled 2020-11-09: qty 50, 17d supply, fill #1

## 2020-09-28 MED ORDER — LIDOCAINE 5 % EX PTCH
MEDICATED_PATCH | CUTANEOUS | 0 refills | Status: DC
  Filled 2020-09-28 – 2020-09-30 (×2): qty 30, 30d supply, fill #0

## 2020-09-30 ENCOUNTER — Other Ambulatory Visit (HOSPITAL_COMMUNITY): Payer: Self-pay

## 2020-09-30 ENCOUNTER — Encounter: Payer: Self-pay | Admitting: Oncology

## 2020-10-05 ENCOUNTER — Other Ambulatory Visit: Payer: Self-pay | Admitting: Family Medicine

## 2020-10-05 ENCOUNTER — Other Ambulatory Visit (HOSPITAL_COMMUNITY): Payer: Self-pay

## 2020-10-06 ENCOUNTER — Other Ambulatory Visit (HOSPITAL_COMMUNITY): Payer: Self-pay

## 2020-10-06 ENCOUNTER — Telehealth: Payer: Self-pay | Admitting: *Deleted

## 2020-10-06 ENCOUNTER — Other Ambulatory Visit: Payer: Self-pay | Admitting: *Deleted

## 2020-10-06 MED ORDER — CLONAZEPAM 0.5 MG PO TABS
ORAL_TABLET | Freq: Every day | ORAL | 0 refills | Status: DC
  Filled 2020-10-06: qty 90, fill #0
  Filled 2020-10-08: qty 90, 90d supply, fill #0

## 2020-10-06 NOTE — Telephone Encounter (Signed)
Junious Dresser can you look into this please.

## 2020-10-06 NOTE — Telephone Encounter (Signed)
Key: [id number] Please advise the dispensing pharmacy to contact the [facility] at  Rx [id number] 0.'[id number]'$  tablets send to pharmacy  Waiting for determination

## 2020-10-06 NOTE — Telephone Encounter (Signed)
Patient is calling in stating that the medicine requires a PA in order to cover the medication.

## 2020-10-07 ENCOUNTER — Other Ambulatory Visit (HOSPITAL_COMMUNITY): Payer: Self-pay

## 2020-10-07 ENCOUNTER — Encounter: Payer: Self-pay | Admitting: Oncology

## 2020-10-08 ENCOUNTER — Encounter: Payer: Self-pay | Admitting: Oncology

## 2020-10-08 ENCOUNTER — Other Ambulatory Visit (HOSPITAL_COMMUNITY): Payer: Self-pay

## 2020-10-13 ENCOUNTER — Encounter: Payer: Self-pay | Admitting: Family Medicine

## 2020-10-14 ENCOUNTER — Encounter: Payer: Self-pay | Admitting: Oncology

## 2020-10-14 ENCOUNTER — Telehealth (INDEPENDENT_AMBULATORY_CARE_PROVIDER_SITE_OTHER): Payer: BC Managed Care – PPO | Admitting: Family Medicine

## 2020-10-14 ENCOUNTER — Other Ambulatory Visit (HOSPITAL_COMMUNITY): Payer: Self-pay

## 2020-10-14 ENCOUNTER — Encounter: Payer: Self-pay | Admitting: Family Medicine

## 2020-10-14 DIAGNOSIS — U071 COVID-19: Secondary | ICD-10-CM

## 2020-10-14 MED ORDER — BENZONATATE 200 MG PO CAPS
200.0000 mg | ORAL_CAPSULE | Freq: Two times a day (BID) | ORAL | 0 refills | Status: DC | PRN
  Filled 2020-10-14: qty 20, 10d supply, fill #0

## 2020-10-14 MED ORDER — MOLNUPIRAVIR EUA 200MG CAPSULE
4.0000 | ORAL_CAPSULE | Freq: Two times a day (BID) | ORAL | 0 refills | Status: AC
  Filled 2020-10-14: qty 40, 5d supply, fill #0

## 2020-10-14 NOTE — Patient Instructions (Signed)
HOME CARE TIPS:    -I sent the medication(s) we discussed to your pharmacy: Meds ordered this encounter  Medications   molnupiravir EUA (LAGEVRIO) 200 mg CAPS capsule    Sig: Take 4 capsules (800 mg total) by mouth 2 (two) times daily for 5 days.    Dispense:  40 capsule    Refill:  0   benzonatate (TESSALON) 200 MG capsule    Sig: Take 1 capsule (200 mg total) by mouth 2 (two) times daily as needed for cough.    Dispense:  20 capsule    Refill:  0     -I sent in the [facility] treatment or referral you requested per our discussion. Please see the information provided below and discuss further with the pharmacist/treatment team.    Samule Ohm is a chance of rebound illness after finishing your treatment. If you become sick again please isolate for an additional 5 days.    -can use nasal saline a few times per day if you have nasal congestion  -stay hydrated, drink plenty of fluids and eat small healthy meals - avoid dairy  -check out the [facility] website for more information on home care, transmission and treatment for COVID19  -follow up with your doctor in 2-3 days unless improving and feeling better  -stay home while sick, except to seek medical care. If you have COVID19, ideally it would be best to stay home for a full 10 days since the onset of symptoms PLUS one day of no fever and feeling better. Wear a good mask that fits snugly (such as N95 or KN95) if around others to reduce the risk of transmission.  It was nice to meet you today, and I really hope you are feeling better soon. I help [facility] out with telemedicine visits on Tuesdays and Thursdays and am available for visits on those days. If you have any concerns or questions following this visit please schedule a follow up visit with your Primary Care doctor or seek care at a local urgent care clinic to avoid delays in care.    Seek in person care or schedule a follow up video visit promptly if your symptoms worsen, new  concerns arise or you are not improving with treatment. Call 911 and/or seek emergency care if your symptoms are severe or life threatening.    Fact Sheet for Patients And Caregivers Emergency Use Authorization (EUA) Of LAGEVRIOT (molnupiravir) capsules For Coronavirus Disease 2019 (COVID-19)  What is the most important information I should know about LAGEVRIO? LAGEVRIO may cause serious side effects, including: ? LAGEVRIO may cause harm to your unborn baby. It is not known if LAGEVRIO will harm your baby if you take LAGEVRIO during pregnancy. o LAGEVRIO is not recommended for use in pregnancy. o LAGEVRIO has not been studied in pregnancy. LAGEVRIO was studied in pregnant animals only. When LAGEVRIO was given to pregnant animals, LAGEVRIO caused harm to their unborn babies. o You and your healthcare provider may decide that you should take LAGEVRIO during pregnancy if there are no other COVID-19 treatment options approved or authorized by the FDA that are accessible or clinically appropriate for you. o If you and your healthcare provider decide that you should take LAGEVRIO during pregnancy, you and your healthcare provider should discuss the known and potential benefits and the potential risks of taking LAGEVRIO during pregnancy. For individuals who are able to become pregnant: ? You should use a reliable method of birth control (contraception) consistently and correctly during treatment with  LAGEVRIO and for 4 days after the last dose of LAGEVRIO. Talk to your healthcare provider about reliable birth control methods. ? Before starting treatment with [facility] your healthcare provider may do a pregnancy test to see if you are pregnant before starting treatment with LAGEVRIO. ? Tell your healthcare provider right away if you become pregnant or think you may be pregnant during treatment with LAGEVRIO. Pregnancy Surveillance Program: ? There is a pregnancy surveillance program for  individuals who take LAGEVRIO during pregnancy. The purpose of this program is to collect information about the health of you and your baby. Talk to your healthcare provider about how to take part in this program. ? If you take LAGEVRIO during pregnancy and you agree to participate in the pregnancy surveillance program and allow your healthcare provider to share your information with [place], then your healthcare provider will report your use of [place] during pregnancy to [place]. by calling [phone] or [email or web address].es. For individuals who are sexually active with partners who are able to become pregnant: ? It is not known if LAGEVRIO can affect sperm. While the risk is regarded as low, animal studies to fully assess the potential for LAGEVRIO to affect the babies of males treated with LAGEVRIO have not been completed. A reliable method of birth control (contraception) should be used consistently and correctly during treatment with LAGEVRIO and for at least 3 months after the last dose. The risk to sperm beyond 3 months is not known. Studies to understand the risk to sperm beyond 3 months are ongoing. Talk to your healthcare provider about reliable birth control methods. Talk to your healthcare provider if you have questions or concerns about how LAGEVRIO may affect sperm. You are being given this fact sheet because your healthcare provider believes it is necessary to provide you with LAGEVRIO for the treatment of adults with mild-to-moderate coronavirus disease 2019 (COVID-19) with positive results of direct SARS-CoV-2 viral testing, and who are at high risk for progression to severe COVID-19 including hospitalization or death, and for whom other COVID-19 treatment options approved or authorized by the FDA are not accessible or clinically appropriate. The U.S. Food and Drug Administration (FDA) has issued an Emergency Use Authorization (EUA) to  make LAGEVRIO available during the COVID-19 pandemic (for more details about an EUA please see "What is an Emergency Use Authorization?" at the end of this document). LAGEVRIO is not an FDA-approved medicine in the [place]. Read this Fact Sheet for information about LAGEVRIO. Talk to your healthcare provider about your options if you have any questions. It is your choice to take LAGEVRIO.  What is COVID-19? COVID-19 is caused by a virus called a coronavirus. You can get COVID-19 through close contact with another person who has the virus. COVID-19 illnesses have ranged from very mild-to-severe, including illness resulting in death. While information so far suggests that most COVID-19 illness is mild, serious illness can happen and may cause some of your other medical conditions to become worse. Older people and people of all ages with severe, long lasting (chronic) medical conditions like heart disease, lung disease and diabetes, for example seem to be at higher risk of being hospitalized for COVID-19.  What is LAGEVRIO? LAGEVRIO is an investigational medicine used to treat mild-to-moderate COVID-19 in adults: ? with positive results of direct SARS-CoV-2 viral testing, and ? who are at high risk for progression to severe COVID-19 including hospitalization or death, and for whom other COVID-19 treatment options  approved or authorized by the FDA are not accessible or clinically appropriate. The FDA has authorized the emergency use of LAGEVRIO for the treatment of mild-tomoderate COVID-19 in adults under an EUA. For more information on EUA, see the "What is an Emergency Use Authorization (EUA)?" section at the end of this Fact Sheet. LAGEVRIO is not authorized: ? for use in people less than 3 years of age. ? for prevention of COVID-19. ? for people needing hospitalization for COVID-19. ? for use for longer than 5 consecutive days.  What should I tell my healthcare provider before I  take LAGEVRIO? Tell your healthcare provider if you: ? Have any allergies ? Are breastfeeding or plan to breastfeed ? Have any serious illnesses ? Are taking any medicines (prescription, over-the-counter, vitamins, or herbal products).  How do I take LAGEVRIO? ? Take LAGEVRIO exactly as your healthcare provider tells you to take it. ? Take 4 capsules of LAGEVRIO every 12 hours (for example, at 8 am and at 8 pm) ? Take LAGEVRIO for 5 days. It is important that you complete the full 5 days of treatment with LAGEVRIO. Do not stop taking LAGEVRIO before you complete the full 5 days of treatment, even if you feel better. ? Take LAGEVRIO with or without food. ? You should stay in isolation for as long as your healthcare provider tells you to. Talk to your healthcare provider if you are not sure about how to properly isolate while you have COVID-19. ? Swallow LAGEVRIO capsules whole. Do not open, break, or crush the capsules. If you cannot swallow capsules whole, tell your healthcare provider. ? What to do if you miss a dose: o If it has been less than 10 hours since the missed dose, take it as soon as you remember o If it has been more than 10 hours since the missed dose, skip the missed dose and take your dose at the next scheduled time. ? Do not double the dose of LAGEVRIO to make up for a missed dose.  What are the important possible side effects of LAGEVRIO? ? See, "What is the most important information I should know about LAGEVRIO?" ? Allergic Reactions. Allergic reactions can happen in people taking LAGEVRIO, even after only 1 dose. Stop taking LAGEVRIO and call your healthcare provider right away if you get any of the following symptoms of an allergic reaction: o hives o rapid heartbeat o trouble swallowing or breathing o swelling of the mouth, lips, or face o throat tightness o hoarseness o skin rash The most common side effects of LAGEVRIO are: ? diarrhea ? nausea ?  dizziness These are not all the possible side effects of LAGEVRIO. Not many people have taken LAGEVRIO. Serious and unexpected side effects may happen. This medicine is still being studied, so it is possible that all of the risks are not known at this time.  What other treatment choices are there?  Veklury (remdesivir) is FDA-approved as an intravenous (IV) infusion for the treatment of mildto-moderate EQAST-41 in certain adults and children. Talk with your doctor to see if Marijean Heath is appropriate for you. Like LAGEVRIO, FDA may also allow for the emergency use of other medicines to treat people with COVID-19. Go to LacrosseProperties.si for more information. It is your choice to be treated or not to be treated with LAGEVRIO. Should you decide not to take it, it will not change your standard medical care.  What if I am breastfeeding? Breastfeeding is not recommended during treatment with LAGEVRIO  and for 4 days after the last dose of LAGEVRIO. If you are breastfeeding or plan to breastfeed, talk to your healthcare provider about your options and specific situation before taking LAGEVRIO.  How do I report side effects with LAGEVRIO? Contact your healthcare provider if you have any side effects that bother you or do not go away. Report side effects to FDA MedWatch at SmoothHits.hu or call 1-800-FDA-1088 (1- 754-396-9709).  How should I store [place]? ? Store LAGEVRIO capsules at room temperature between 72F to 36F (20C to 25C). ? Keep LAGEVRIO and all medicines out of the reach of children and pets. How can I learn more about COVID-19? ? Ask your healthcare provider. ? Visit SeekRooms.co.uk ? Contact your local or state public health department. ? Call [place] at [phone] (toll free in the [place] ? Visit [email or web address].com  What Is an Emergency Use  Authorization (EUA)? The [place] FDA has made [place] available under an emergency access mechanism called an Emergency Use Authorization (EUA) The EUA is supported by a Presenter, broadcasting Health and Human Service (HHS) declaration that circumstances exist to justify emergency use of drugs and biological products during the COVID-19 pandemic. LAGEVRIO for the treatment of mild-to-moderate COVID-19 in adults with positive results of direct SARS-CoV-2 viral testing, who are at high risk for progression to severe COVID-19, including hospitalization or death, and for whom alternative COVID-19 treatment options approved or authorized by FDA are not accessible or clinically appropriate, has not undergone the same type of review as an FDA-approved product. In issuing an EUA under the RTMYT-11 public health emergency, the FDA has determined, among other things, that based on the total amount of scientific evidence available including data from adequate and well-controlled clinical trials, if available, it is reasonable to believe that the product may be effective for diagnosing, treating, or preventing COVID-19, or a serious or life-threatening disease or condition caused by COVID-19; that the known and potential benefits of the product, when used to diagnose, treat, or prevent such disease or condition, outweigh the known and potential risks of such product; and that there are no adequate, approved, and available alternatives.  All of these criteria must be met to allow for the product to be used in the treatment of patients during the COVID-19 pandemic. The EUA for LAGEVRIO is in effect for the duration of the COVID-19 declaration justifying emergency use of LAGEVRIO, unless terminated or revoked (after which LAGEVRIO may no longer be used under the EUA). For patent information: http://rogers.info/ Copyright  2021-2022 [employer] [place], [place] and its affiliates. All rights  reserved. [id number] Revised: March 2022

## 2020-10-14 NOTE — Progress Notes (Signed)
Virtual Visit via Telephone Note  I connected with Jeffery Wood on 10/14/20 at 12:40 PM EDT by telephone and verified that I am speaking with the correct person using two identifiers.   I discussed the limitations, risks, security and privacy concerns of performing an evaluation and management service by telephone and the availability of in person appointments. I also discussed with the patient that there may be a patient responsible charge related to this service. The patient expressed understanding and agreed to proceed.  Location patient: home, [place] Location provider: work or home office Participants present for the call: patient, provider Patient did not have a visit with me in the prior 7 days to address this/these issue(s).   History of Present Illness:  Acute telemedicine visit for Covid19: -Onset: 2 days ago; tested positive for covid last night -Symptoms include: nasal congestion, cough, scratchy throat -Denies:fever, CP, SOB, NVD, inability to eat/drink/get out of bed -Pertinent past medical history: see below -Pertinent medication allergies: No Known Allergies -COVID-19 vaccine status: 2 doses of J and J   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    Can[facility])    basal cell of right temple; carcinoids   Complication of anesthesia    hard to wake up after bronchoscopy   COPD (chronic obstructive pulmonary disease) ([place])    Diabetes ([place])    Dyspnea    GERD (gastroesophageal reflux disease)    Gout    Headache    History of hiatal hernia 05/16/2006   small noted on EGD   Hypertension    Lung mass    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 I do not appreciate any SOB. Speech and  thought processing are grossly intact. Patient reported vitals:  Assessment and Plan:  COVID-19   Discussed treatment options (infusions and oral options and risk of drug interactions), ideal treatment window, potential complications, isolation and precautions for COVID-19.  Discussed possibility of rebound with antivirals and the need to reisolate if it should occur for 5 days. Checked for/reviewed any labs done in the last 90 days with GFR listed in HPI if available. After lengthy discussion, the patient opted for treatment with molnupiravir due to being higher risk for complications of covid or severe disease and other factors. Discussed EUA status of this drug and the fact that there is preliminary limited knowledge of risks/interactions/side effects per EUA document vs possible benefits and precautions. This information was shared with patient during the visit and also was provided in patient instructions. Also, advised that patient discuss risks/interactions and use with pharmacist/treatment team as well.  The patient did want a prescription for cough, Tessalon Rx sent.  Other symptomatic care measures summarized in patient instructions. Work/School slipped offered:  declined Advised to seek prompt in person care if worsening, new symptoms arise, or if is not improving with treatment. Advised of options for inperson care in case PCP office not available. Did let the patient know that I only do telemedicine shifts for [place] on Tuesdays and Thursdays and advised a follow up visit with PCP or at an [facility] if has further questions or concerns.  Follow Up Instructions:  I did not refer this patient for an OV with me in the next 24 hours for this/these issue(s).  I discussed the assessment and treatment plan with the patient. The patient was provided an opportunity to ask questions and all were answered. The patient agreed with the plan and demonstrated an understanding of the instructions.   I  spent 19 minutes on the date of this visit in the care of this patient. See summary of tasks completed to properly care for this patient in the detailed notes above which also included counseling of above, review of PMH, medications, allergies, evaluation of the patient and ordering and/or  instructing patient on testing and care options.     Lucretia Kern, DO

## 2020-10-18 ENCOUNTER — Other Ambulatory Visit (HOSPITAL_COMMUNITY): Payer: Self-pay

## 2020-10-19 ENCOUNTER — Other Ambulatory Visit (HOSPITAL_COMMUNITY): Payer: Self-pay

## 2020-10-20 ENCOUNTER — Other Ambulatory Visit (HOSPITAL_COMMUNITY): Payer: Self-pay

## 2020-10-21 ENCOUNTER — Other Ambulatory Visit (HOSPITAL_COMMUNITY): Payer: Self-pay

## 2020-10-26 ENCOUNTER — Other Ambulatory Visit (HOSPITAL_COMMUNITY): Payer: Self-pay

## 2020-11-08 ENCOUNTER — Ambulatory Visit (INDEPENDENT_AMBULATORY_CARE_PROVIDER_SITE_OTHER): Payer: BC Managed Care – PPO | Admitting: Family Medicine

## 2020-11-08 ENCOUNTER — Encounter: Payer: Self-pay | Admitting: Family Medicine

## 2020-11-08 ENCOUNTER — Other Ambulatory Visit: Payer: Self-pay

## 2020-11-08 ENCOUNTER — Other Ambulatory Visit (HOSPITAL_COMMUNITY): Payer: Self-pay

## 2020-11-08 VITALS — BP 146/88 | HR 77 | Temp 97.7°F | Ht 75.0 in | Wt 251.8 lb

## 2020-11-08 DIAGNOSIS — E785 Hyperlipidemia, unspecified: Secondary | ICD-10-CM

## 2020-11-08 DIAGNOSIS — M792 Neuralgia and neuritis, unspecified: Secondary | ICD-10-CM

## 2020-11-08 DIAGNOSIS — Z23 Encounter for immunization: Secondary | ICD-10-CM

## 2020-11-08 DIAGNOSIS — I10 Essential (primary) hypertension: Secondary | ICD-10-CM

## 2020-11-08 DIAGNOSIS — J4530 Mild persistent asthma, uncomplicated: Secondary | ICD-10-CM

## 2020-11-08 DIAGNOSIS — R739 Hyperglycemia, unspecified: Secondary | ICD-10-CM

## 2020-11-08 DIAGNOSIS — M1990 Unspecified osteoarthritis, unspecified site: Secondary | ICD-10-CM

## 2020-11-08 DIAGNOSIS — Z0001 Encounter for general adult medical examination with abnormal findings: Secondary | ICD-10-CM | POA: Diagnosis not present

## 2020-11-08 DIAGNOSIS — N400 Enlarged prostate without lower urinary tract symptoms: Secondary | ICD-10-CM | POA: Diagnosis not present

## 2020-11-08 DIAGNOSIS — M545 Low back pain, unspecified: Secondary | ICD-10-CM

## 2020-11-08 DIAGNOSIS — E1169 Type 2 diabetes mellitus with other specified complication: Secondary | ICD-10-CM | POA: Insufficient documentation

## 2020-11-08 DIAGNOSIS — G8929 Other chronic pain: Secondary | ICD-10-CM

## 2020-11-08 LAB — COMPREHENSIVE METABOLIC PANEL
ALT: 9 U/L (ref 0–53)
AST: 12 U/L (ref 0–37)
Albumin: 3.9 g/dL (ref 3.5–5.2)
Alkaline Phosphatase: 82 U/L (ref 39–117)
BUN: 13 mg/dL (ref 6–23)
CO2: 31 mEq/L (ref 19–32)
Calcium: 9.2 mg/dL (ref 8.4–10.5)
Chloride: 99 mEq/L (ref 96–112)
Creatinine, Ser: 0.78 mg/dL (ref 0.40–1.50)
GFR: 94.07 mL/min (ref >60.00)
Glucose, Bld: 124 mg/dL — ABNORMAL HIGH (ref 70–99)
Potassium: 4.9 mEq/L (ref 3.5–5.1)
Sodium: 136 mEq/L (ref 135–145)
Total Bilirubin: 0.7 mg/dL (ref 0.2–1.2)
Total Protein: 6.2 g/dL (ref 6.0–8.3)

## 2020-11-08 LAB — TSH: TSH: 1 u[IU]/mL (ref 0.35–5.50)

## 2020-11-08 LAB — CBC
HCT: 43.6 % (ref 39.0–52.0)
Hemoglobin: 14.6 g/dL (ref 13.0–17.0)
MCHC: 33.5 g/dL (ref 30.0–36.0)
MCV: 88.9 fl (ref 78.0–100.0)
Platelets: 268 10*3/uL (ref 150.0–400.0)
RBC: 4.9 Mil/uL (ref 4.22–5.81)
RDW: 13.6 % (ref 11.5–15.5)
WBC: 5.7 10*3/uL (ref 4.0–10.5)

## 2020-11-08 LAB — PSA: PSA: 0.69 ng/mL (ref 0.10–4.00)

## 2020-11-08 LAB — LIPID PANEL
Cholesterol: 184 mg/dL (ref 0–200)
HDL: 35.8 mg/dL — ABNORMAL LOW (ref >39.00)
LDL Cholesterol: 129 mg/dL — ABNORMAL HIGH (ref 0–99)
NonHDL: 148.64
Total CHOL/HDL Ratio: 5
Triglycerides: 97 mg/dL (ref 0.0–149.0)
VLDL: 19.4 mg/dL (ref 0.0–40.0)

## 2020-11-08 LAB — HEMOGLOBIN A1C: Hgb A1c MFr Bld: 6.4 % (ref 4.6–6.5)

## 2020-11-08 MED ORDER — GABAPENTIN 300 MG PO CAPS
900.0000 mg | ORAL_CAPSULE | Freq: Three times a day (TID) | ORAL | 3 refills | Status: DC
  Filled 2020-11-08 – 2020-12-06 (×2): qty 810, 90d supply, fill #0
  Filled 2021-03-18: qty 810, 90d supply, fill #1
  Filled 2021-05-30: qty 810, 90d supply, fill #2
  Filled 2021-08-28: qty 810, 90d supply, fill #3

## 2020-11-08 MED ORDER — TERBINAFINE HCL 250 MG PO TABS
250.0000 mg | ORAL_TABLET | Freq: Every day | ORAL | 0 refills | Status: DC
  Filled 2020-11-08: qty 90, 90d supply, fill #0

## 2020-11-08 NOTE — Progress Notes (Signed)
Chief Complaint:  Jeffery Wood is a 65 y.o. male who presents today for his annual comprehensive physical exam.    Assessment/Plan:  New/Acute Problems: Covid Recovering.  Normal lung exam today.  Discussed typical course of recovery.  He will let me know if he does not continue to improve over the next several weeks.  Onychomycosis Start terbinafine for 12-week course.  We will check c-Met today.  Chronic Problems Addressed Today: Dyslipidemia Check lipids.   Hyperglycemia Check A1c. Continue to metformin 1000 mg twice daily.  Discussed lifestyle modifications.  Osteoarthritis Follows with pain management.  Takes tramadol and over-the-counter meds as needed.  Chronic low back pain Stable.  BPH (benign prostatic hyperplasia) Check PSA.  Symptoms are stable.  Asthma Mild flare due to COVID.  We will continue current regimen with Spiriva and Xopenex.  If continues to have issues will likely need referral back to pulmonology.  Hypertension At goal per JNC 8 off meds.  Neuropathic pain Symptoms are still bothersome.  Will increase gabapentin to 900 mg 3 times daily.  Continue Cymbalta 30 mg daily.  We will check in with me in a few weeks via MyChart.   Body mass index is 31.47 kg/m. / Obese    Preventative Healthcare: Shot given today.  Check labs.  Up-to-date on colon cancer screening.  He will check with insurance about shingles vaccine.  Patient Counseling(The following topics were reviewed and/or handout was given):  -Nutrition: Stressed importance of moderation in sodium/caffeine intake, saturated fat and cholesterol, caloric balance, sufficient intake of fresh fruits, vegetables, and fiber.  -Stressed the importance of regular exercise.   -Substance Abuse: Discussed cessation/primary prevention of tobacco, alcohol, or other drug use; driving or other dangerous activities under the influence; availability of treatment for abuse.   -Injury prevention: Discussed  safety belts, safety helmets, smoke detector, smoking near bedding or upholstery.   -Sexuality: Discussed sexually transmitted diseases, partner selection, use of condoms, avoidance of unintended pregnancy and contraceptive alternatives.   -Dental health: Discussed importance of regular tooth brushing, flossing, and dental visits.  -Health maintenance and immunizations reviewed. Please refer to Health maintenance section.  Return to care in 1 year for next preventative visit.     Subjective:  HPI:  He has no acute complaints today. See A/p for status of chronic conditions.   He has had ongoing issues with toenail fungus for several years.  Over-the-counter meds are not working.  He also had COVID about a month ago.  Symptoms were mild.  He thinks he has recovered.  Still has occasional shortness of breath with certain activities but this seems to be improving.  Lifestyle Diet: None specific.  Exercise: Limited.   Depression screen PHQ 2/9 09/07/2020  Decreased Interest 0  Down, Depressed, Hopeless 0  PHQ - 2 Score 0  Altered sleeping -  Tired, decreased energy -  Change in appetite -  Feeling bad or failure about yourself  -  Trouble concentrating -  Moving slowly or fidgety/restless -  Suicidal thoughts -  PHQ-9 Score -  Difficult doing work/chores -   Health Maintenance Due  Topic Date Due   Zoster Vaccines- Shingrix (1 of 2) Never done    ROS: Per HPI, otherwise a complete review of systems was negative.   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, in remission   Neuromuscular disorder ([place])    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   Sleep apnea    uses mouth piece   Patient Active Problem List   Diagnosis Date Noted   Dyslipidemia 11/08/2020   Hallux valgus 09/07/2020   Insomnia 12/16/2019   Spondylolisthesis of lumbar region 01/17/2018   Hypertension 11/29/2017   Asthma 11/29/2017   BPH (benign prostatic hyperplasia) 11/29/2017   Chronic low back pain 11/29/2017   Osteoarthritis 11/29/2017   Psoriasis 11/29/2017   GERD (gastroesophageal reflux disease) 11/29/2017   Neuropathic pain 04/14/2017   Constipation 03/04/2017   Lymphoma, 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   NHL (non-Hodgkin's lymphoma) ([place]) 02/07/2017   OSA (obstructive sleep apnea) 07/22/2013   Hyperglycemia 12/07/2010   Past Surgical History:  Procedure Laterality Date   BICEPS TENDON REPAIR Left    CARPAL TUNNEL RELEASE     COLONOSCOPY     FINGER SURGERY     KNEE ARTHROSCOPY     ACL tear   LEFT HEART CATHETERIZATION WITH CORONARY ANGIOGRAM N/A 12/07/2010   Procedure: LEFT HEART CATHETERIZATION WITH CORONARY ANGIOGRAM;  Surgeon: Leonie Man, MD;  Location: [facility] CATH LAB;  Service: Cardiovascular;  Laterality: N/A;   PILONIDAL CYST EXCISION     PORT-A-CATH REMOVAL N/A 11/09/2017   Procedure: REMOVAL PORT-A-CATH;  Surgeon: [name], MD;  Location: [facility];  Service: General;  Laterality: N/A;  local   PORTACATH PLACEMENT Left 02/27/2017   Procedure: INSERTION PORT-A-CATH;  Surgeon: [name], MD;  Location: [facility] ORS;  Service: General;  Laterality: Left;   UPPER GASTROINTESTINAL ENDOSCOPY  05/16/2006   VIDEO BRONCHOSCOPY WITH ENDOBRONCHIAL ULTRASOUND N/A 01/24/2017   Procedure: VIDEO BRONCHOSCOPY WITH ENDOBRONCHIAL ULTRASOUND with TRANSBRONCHIAL BIOPSY;  Surgeon: Grace Isaac, MD;  Location: [facility];   Service: Thoracic;  Laterality: N/A;   VIDEO MEDIASTINOSCOPY N/A 02/05/2017   Procedure: VIDEO MEDIASTINOSCOPY;  Surgeon: Grace Isaac, MD;  Location: [facility];  Service: Thoracic;  Laterality: N/A;    Family History  Problem Relation Age of Onset   Emphysema Mother    Asthma Mother    Heart disease Father     Medications- reviewed and updated Current Outpatient Medications  Medication Sig Dispense Refill   Accu-Chek Softclix Lancets lancets USE TO TEST BLOOD SUGARS DAILY. DX: E11.9 100 each 3   aspirin EC 81 MG tablet Take 81 mg by mouth daily.     augmented betamethasone dipropionate (DIPROLENE-AF) 0.05 % cream APPLY AS DIRECTED TO AFFECTED AREAS ON THE SKIN 50 g 99   Blood Glucose Monitoring Suppl (ACCU-CHEK GUIDE) w/Device KIT USE TO TEST BLOOD SUGARS DAILY. DX: E11.9 1 kit 3   celecoxib (CELEBREX) 200 MG capsule TAKE 1 CAPSULE BY MOUTH WITH FOOD TWICE A DAY 90 (Patient taking differently: Take 200 mg by mouth at bedtime as needed.) 180 capsule 2   clonazePAM (KLONOPIN) 0.5 MG tablet TAKE 1 TABLET BY MOUTH EVERY  NIGHT AT BEDTIME 90 tablet 0   DULoxetine (CYMBALTA) 30 MG capsule TAKE 1 CAPSULE (30 MG TOTAL) BY MOUTH AT BEDTIME. 90 capsule 1   glucose blood test strip USE TO TEST BLOOD SUGARS DAILY. DX: E11.9 100 strip 3   levalbuterol (XOPENEX HFA) 45 MCG/ACT inhaler Inhale 2 puffs into the lungs daily as needed for wheezing or shortness of breath. 1 Inhaler 1   loratadine (CLARITIN) 10 MG tablet Take 10 mg by mouth daily.     metFORMIN (GLUCOPHAGE) 1000 MG tablet TAKE ONE (1) TABLET BY MOUTH TWO (2) TIMES DAILY 180 tablet 99   omeprazole (PRILOSEC) 20 MG capsule TAKE 1 CAPSULE BY MOUTH 2 TIMES DAILY. 180 capsule 2   tamsulosin (FLOMAX) 0.4 MG CAPS capsule TAKE 1 CAPSULE (0.4 MG TOTAL) BY MOUTH DAILY. 90 capsule 1   terbinafine (LAMISIL) 250 MG tablet Take 1 tablet (250 mg total) by mouth daily. 90 tablet 0   tiotropium (SPIRIVA) 18 MCG inhalation capsule PLACE ONE CAPSULE IN THE  INHALER AND INHALE ONCE DAILY 30 capsule 99   tiZANidine (ZANAFLEX) 4 MG tablet TAKE 1 TABLET BY MOUTH EVERY 6 (SIX) HOURS AS NEEDED FOR MUSCLE SPASMS. 30 tablet 0   traMADol (ULTRAM) 50 MG tablet Take 1 tablet by mouth every 8 hours as needed 50 tablet 1   gabapentin (NEURONTIN) 300 MG capsule Take 3 capsules (900 mg total) by mouth 3 (three) times daily. 810 capsule 3   HYDROcodone-acetaminophen (NORCO/VICODIN) 5-325 MG tablet Take 1 tablet by mouth every 6 hours as needed for 5 days (Patient not taking: Reported on 11/08/2020) 20 tablet 0   lidocaine (LIDODERM) 5 % Apply 1 patch topically daily (May wear up to 12 hours.) (Patient not taking: Reported on 11/08/2020) 30 patch 0   No current facility-administered medications for this visit.    Allergies-reviewed and updated No Known Allergies  Social History   Socioeconomic History   Marital status: Married    Spouse name: Not on file   Number of children: Not on file   Years of education: Not on file   Highest education level: Not on file  Occupational History   Occupation: retired  Tobacco Use   Smoking status: Former    Packs/day: 3.00    Years: 20.00    Pack years: 60.00    Types: Cigarettes    Quit date: 01/31/1988    Years since quitting: 32.7   Smokeless tobacco: Never  Vaping Use   Vaping Use: Never used  Substance and Sexual Activity   Alcohol use: No   Drug use: No   Sexual activity: Yes    Partners: Female    Birth control/protection: None  Other Topics Concern   Not on file  Social History Narrative   Not on file   Social Determinants of Health   Financial Resource Strain: Not on file  Food Insecurity: Not on file  Transportation Needs: Not on file  Physical Activity: Not on file  Stress: Not on file  Social Connections: Not on file        Objective:  Physical Exam: BP (!) 146/88   Pulse 77   Temp 97.7 F (36.5 C) (Temporal)   Ht 6' 3"  (1.905 m)   Wt 251 lb 12.8 oz (114.2 kg)   SpO2 96%   BMI  31.47 kg/m   Body mass index is 31.47 kg/m. Wt Readings from Last 3 Encounters:  11/08/20 251 lb 12.8 oz (114.2 kg)  09/07/20 253 lb (114.8  kg)  09/01/20 252 lb 12.8 oz (114.7 kg)   Gen: NAD, resting comfortably HEENT: TMs normal bilaterally. OP clear. No thyromegaly noted.  CV: RRR with no murmurs appreciated Pulm: NWOB, CTAB with no crackles, wheezes, or rhonchi GI: Normal bowel sounds present. Soft, Nontender, Nondistended. MSK: no edema, cyanosis, or clubbing noted.  Onychomycosis noted. Skin: warm, dry Neuro: CN2-12 grossly intact. Strength 5/5 in upper and lower extremities. Reflexes symmetric and intact bilaterally.  Psych: Normal affect and thought content     Jeffery Wood M. Jerline Pain, MD 11/08/2020 8:37 AM

## 2020-11-08 NOTE — Assessment & Plan Note (Signed)
Check lipids 

## 2020-11-08 NOTE — Assessment & Plan Note (Signed)
Check A1c. Continue to metformin 1000 mg twice daily.  Discussed lifestyle modifications.

## 2020-11-08 NOTE — Assessment & Plan Note (Signed)
Follows with pain management.  Takes tramadol and over-the-counter meds as needed.

## 2020-11-08 NOTE — Assessment & Plan Note (Signed)
Stable

## 2020-11-08 NOTE — Assessment & Plan Note (Signed)
Check PSA.  Symptoms are stable. 

## 2020-11-08 NOTE — Assessment & Plan Note (Signed)
Symptoms are still bothersome.  Will increase gabapentin to 900 mg 3 times daily.  Continue Cymbalta 30 mg daily.  We will check in with me in a few weeks via MyChart.

## 2020-11-08 NOTE — Patient Instructions (Addendum)
It was very nice to see you today!  We will increase your gabapentin to 900mg  3 times daily.  We will get your flu shot today.  We we will check lab work today.  Please start the terbinafine.  This will take several months but should clear the fungus from your toenails.  I will see back in year for your next physical.  Come back sooner if needed.  Take care, Dr Jerline Pain  PLEASE NOTE:  If you had any lab tests please let us know if you have not heard back within a few days. You may see your results on mychart before we have a chance to review them but we will give you a call once they are reviewed by [place]. If we ordered any referrals today, please let us know if you have not heard from their office within the next week.   Please try these tips to maintain a healthy lifestyle:  Eat at least 3 REAL meals and 1-2 snacks per day.  Aim for no more than 5 hours between eating.  If you eat breakfast, please do so within one hour of getting up.   Each meal should contain half fruits/vegetables, one quarter protein, and one quarter carbs (no bigger than a computer mouse)  Cut down on sweet beverages. This includes juice, soda, and sweet tea.   Drink at least 1 glass of water with each meal and aim for at least 8 glasses per day  Exercise at least 150 minutes every week.    Preventive Care 75-56 Years Old, Male Preventive care refers to lifestyle choices and visits with your health care provider that can promote health and wellness. This includes: A yearly physical exam. This is also called an annual wellness visit. Regular dental and eye exams. Immunizations. Screening for certain conditions. Healthy lifestyle choices, such as: Eating a healthy diet. Getting regular exercise. Not using drugs or products that contain nicotine and tobacco. Limiting alcohol use. What can I expect for my preventive care visit? Physical exam Your health care provider will check your: Height and weight. These  may be used to calculate your BMI (body mass index). BMI is a measurement that tells if you are at a healthy weight. Heart rate and blood pressure. Body temperature. Skin for abnormal spots. Counseling Your health care provider may ask you questions about your: Past medical problems. Family's medical history. Alcohol, tobacco, and drug use. Emotional well-being. Home life and relationship well-being. Sexual activity. Diet, exercise, and sleep habits. Work and work Statistician. Access to firearms. What immunizations do I need? Vaccines are usually given at various ages, according to a schedule. Your health care provider will recommend vaccines for you based on your age, medical history, and lifestyle or other factors, such as travel or where you work. What tests do I need? Blood tests Lipid and cholesterol levels. These may be checked every 5 years, or more often if you are over 61 years old. Hepatitis C test. Hepatitis B test. Screening Lung cancer screening. You may have this screening every year starting at age 3 if you have a 30-pack-year history of smoking and currently smoke or have quit within the past 15 years. Prostate cancer screening. Recommendations will vary depending on your family history and other risks. Genital exam to check for testicular cancer or hernias. Colorectal cancer screening. All adults should have this screening starting at age 1 and continuing until age 81. Your health care provider may recommend screening at age 26 if  you are at increased risk. You will have tests every 1-10 years, depending on your results and the type of screening test. Diabetes screening. This is done by checking your blood sugar (glucose) after you have not eaten for a while (fasting). You may have this done every 1-3 years. STD (sexually transmitted disease) testing, if you are at risk. Follow these instructions at home: Eating and drinking  Eat a diet that includes fresh  fruits and vegetables, whole grains, lean protein, and low-fat dairy products. Take vitamin and mineral supplements as recommended by your health care provider. Do not drink alcohol if your health care provider tells you not to drink. If you drink alcohol: Limit how much you have to 0-2 drinks a day. Be aware of how much alcohol is in your drink. In the [place] one drink equals one 12 oz bottle of beer (355 mL), one 5 oz glass of wine (148 mL), or one 1 oz glass of hard liquor (44 mL). Lifestyle Take daily care of your teeth and gums. Brush your teeth every morning and night with fluoride toothpaste. Floss one time each day. Stay active. Exercise for at least 30 minutes 5 or more days each week. Do not use any products that contain nicotine or tobacco, such as cigarettes, e-cigarettes, and chewing tobacco. If you need help quitting, ask your health care provider. Do not use drugs. If you are sexually active, practice safe sex. Use a condom or other form of protection to prevent STIs (sexually transmitted infections). If told by your health care provider, take low-dose aspirin daily starting at age 76. Find healthy ways to cope with stress, such as: Meditation, yoga, or listening to music. Journaling. Talking to a trusted person. Spending time with friends and family. Safety Always wear your seat belt while driving or riding in a vehicle. Do not drive: If you have been drinking alcohol. Do not ride with someone who has been drinking. When you are tired or distracted. While texting. Wear a helmet and other protective equipment during sports activities. If you have firearms in your house, make sure you follow all gun safety procedures. What's next? Go to your health care provider once a year for an annual wellness visit. Ask your health care provider how often you should have your eyes and teeth checked. Stay up to date on all vaccines. This information is not intended to replace advice  given to you by your health care provider. Make sure you discuss any questions you have with your health care provider. Document Revised: 03/26/2020 Document Reviewed: 01/10/2018 Elsevier Patient Education  2022 [employer].

## 2020-11-08 NOTE — Assessment & Plan Note (Signed)
At goal per JNC 8 off meds.

## 2020-11-08 NOTE — Assessment & Plan Note (Signed)
Mild flare due to COVID.  We will continue current regimen with Spiriva and Xopenex.  If continues to have issues will likely need referral back to pulmonology.

## 2020-11-09 ENCOUNTER — Other Ambulatory Visit (HOSPITAL_COMMUNITY): Payer: Self-pay

## 2020-11-09 MED FILL — Celecoxib Cap 200 MG: ORAL | 90 days supply | Qty: 180 | Fill #1 | Status: AC

## 2020-11-09 MED FILL — Tiotropium Bromide Monohydrate Inhal Cap 18 MCG (Base Equiv): RESPIRATORY_TRACT | 30 days supply | Qty: 30 | Fill #3 | Status: AC

## 2020-11-10 NOTE — Progress Notes (Signed)
Please inform patient of the following:  His A1c and cholesterol are borderline but stable. Everything else is NORMAL. His blood sugar is close to the diabetic range. We do not need to make any changes to his treatment plan at this time but he should continue working on diet and exercise. I would like to see him back in 6 months to recheck his A1c.

## 2020-11-17 ENCOUNTER — Other Ambulatory Visit (HOSPITAL_COMMUNITY): Payer: Self-pay

## 2020-11-19 ENCOUNTER — Other Ambulatory Visit (HOSPITAL_COMMUNITY): Payer: Self-pay

## 2020-11-19 MED ORDER — METHYLPREDNISOLONE 4 MG PO TBPK
ORAL_TABLET | ORAL | 0 refills | Status: DC
  Filled 2020-11-19: qty 21, 6d supply, fill #0

## 2020-11-19 MED ORDER — CYCLOBENZAPRINE HCL 10 MG PO TABS
ORAL_TABLET | ORAL | 1 refills | Status: DC
  Filled 2020-11-19: qty 50, 17d supply, fill #0

## 2020-11-19 MED ORDER — HYDROCODONE-ACETAMINOPHEN 5-325 MG PO TABS
ORAL_TABLET | ORAL | 0 refills | Status: DC
  Filled 2020-11-19: qty 30, 5d supply, fill #0

## 2020-11-25 ENCOUNTER — Other Ambulatory Visit: Payer: Self-pay

## 2020-11-25 MED FILL — Metformin HCl Tab 1000 MG: ORAL | 90 days supply | Qty: 180 | Fill #2 | Status: CN

## 2020-11-26 ENCOUNTER — Other Ambulatory Visit: Payer: Self-pay

## 2020-11-26 ENCOUNTER — Other Ambulatory Visit (HOSPITAL_COMMUNITY): Payer: Self-pay

## 2020-11-26 MED ORDER — HYDROCODONE-ACETAMINOPHEN 5-325 MG PO TABS
ORAL_TABLET | ORAL | 0 refills | Status: DC
  Filled 2020-11-26: qty 30, 7d supply, fill #0

## 2020-11-26 MED FILL — Metformin HCl Tab 1000 MG: ORAL | 90 days supply | Qty: 180 | Fill #0 | Status: AC

## 2020-11-29 ENCOUNTER — Other Ambulatory Visit (HOSPITAL_COMMUNITY): Payer: Self-pay

## 2020-11-29 ENCOUNTER — Ambulatory Visit: Payer: BC Managed Care – PPO | Admitting: Physician Assistant

## 2020-11-29 ENCOUNTER — Encounter: Payer: Self-pay | Admitting: Physician Assistant

## 2020-11-29 ENCOUNTER — Other Ambulatory Visit: Payer: Self-pay

## 2020-11-29 VITALS — BP 136/88 | HR 89 | Temp 97.7°F | Ht 75.0 in | Wt 249.5 lb

## 2020-11-29 DIAGNOSIS — C8330 Diffuse large B-cell lymphoma, unspecified site: Secondary | ICD-10-CM

## 2020-11-29 DIAGNOSIS — M79604 Pain in right leg: Secondary | ICD-10-CM | POA: Diagnosis not present

## 2020-11-29 DIAGNOSIS — M25551 Pain in right hip: Secondary | ICD-10-CM | POA: Diagnosis not present

## 2020-11-29 MED ORDER — OXYCODONE HCL 5 MG PO TABS
5.0000 mg | ORAL_TABLET | ORAL | 0 refills | Status: DC | PRN
  Filled 2020-11-29: qty 30, 5d supply, fill #0

## 2020-11-29 MED ORDER — CELECOXIB 200 MG PO CAPS
200.0000 mg | ORAL_CAPSULE | Freq: Every day | ORAL | 1 refills | Status: DC
  Filled 2020-11-29 – 2021-04-19 (×2): qty 90, 90d supply, fill #0

## 2020-11-29 NOTE — Progress Notes (Signed)
Jeffery Wood is a 65 y.o. male here for back pain.   History of Present Illness:   Chief Complaint  Patient presents with  . Back Pain    This episode started about 3 weeks ago. He is having pain right lower back radiating down right leg.    HPI  Back Pain Jeffery Wood presents with c/o back pain that has been onset for three weeks. Jeffery Wood states the pain starts in his right lower back and radiates down his right leg. Pt has previously seen Dr. [facility] at [facility] for the issue during a routine visit on October 21st.   According to Jeffery Wood, on October 19th he was putting out some dear corn when he stumbled/twisted around too quickly which resulted in his back pain worsening. He has stated he has a hx of sciatica and lumbago in the past. Following this visit he was prescribed flexeril 10 mg, medrol dose pack, and norco 325 mg for pain, but it has not provided any relief. He is taking tramadol 50 mg but this is used as baseline for his chronic pain.  Currently Jeffery Wood is experiencing pain that he says is worse than when he was going through his non-hodgkin's lymphoma. Jeffery Wood states that he was worried it could be the cancer coming back, but at this moment he is looking for pain relief.   Denies new bowel/bladder incontinence or saddle anesthesia.  Hx of Lymphoma Back in 2019 Jeffery Wood was dx with clinical stage four non- hodgkin's lymphoma. Upon undergoing infusional and intrathecal chemotherapy treatments he achieved remission. He is currently seeing Dr. Benay Spice, Oncology, for regular follow ups. Recently he his follow up have been pushed to every six months upon routine scans appearing normal. According to Jeffery Wood, he has been told by oncology that although he is in remission, there is a fair chance the cancer could come back stronger and more aggressive.    Past Medical History:  Diagnosis Date  . Arthritis   . Asthma   . Basal cell carcinoma (BCC) of  right temple region   . Cancer ([place])    basal cell of right temple; carcinoids  . Complication of anesthesia    hard to wake up after bronchoscopy  . COPD (chronic obstructive pulmonary disease) ([place])   . Diabetes ([place])   . Dyspnea   . GERD (gastroesophageal reflux disease)   . Gout   . Headache   . History of hiatal hernia 05/16/2006   small noted on EGD  . Hypertension   . Lung mass   . 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[place])    last chemo 05/2017, in remission  . Neuromuscular disorder ([place])   . 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  . Sleep apnea    uses mouth piece     Social History   Tobacco Use  . Smoking status: Former    Packs/day: 3.00    Years: 20.00    Pack years: 60.00    Types: Cigarettes    Quit date: 01/31/1988    Years since quitting: 32.8  . Smokeless tobacco: Never  Vaping Use  . Vaping Use: Never used  Substance Use Topics  . Alcohol use: No  . Drug use: No    Past Surgical History:  Procedure Laterality Date  . BICEPS TENDON REPAIR Left   . CARPAL TUNNEL RELEASE    . COLONOSCOPY    . FINGER SURGERY    . KNEE ARTHROSCOPY  ACL tear  . LEFT HEART CATHETERIZATION WITH CORONARY ANGIOGRAM N/A 12/07/2010   Procedure: LEFT HEART CATHETERIZATION WITH CORONARY ANGIOGRAM;  Surgeon: Leonie Man, MD;  Location: [facility] CATH LAB;  Service: Cardiovascular;  Laterality: N/A;  . PILONIDAL CYST EXCISION    . PORT-A-CATH REMOVAL N/A 11/09/2017   Procedure: REMOVAL PORT-A-CATH;  Surgeon: [name], MD;  Location: [place];  Service: General;  Laterality: N/A;  local  . PORTACATH PLACEMENT Left 02/27/2017   Procedure: INSERTION PORT-A-CATH;  Surgeon: [name], MD;  Location: [facility]S;  Service: General;  Laterality: Left;  . UPPER GASTROINTESTINAL ENDOSCOPY  05/16/2006  . VIDEO BRONCHOSCOPY WITH ENDOBRONCHIAL ULTRASOUND N/A 01/24/2017   Procedure: VIDEO BRONCHOSCOPY WITH ENDOBRONCHIAL ULTRASOUND with TRANSBRONCHIAL BIOPSY;  Surgeon: Grace Isaac, MD;  Location: [place];  Service: Thoracic;  Laterality: N/A;  . VIDEO MEDIASTINOSCOPY N/A 02/05/2017   Procedure: VIDEO MEDIASTINOSCOPY;  Surgeon: Grace Isaac, MD;  Location: [facility] OR;  Service: Thoracic;  Laterality: N/A;    Family History  Problem Relation Age of Onset  . Emphysema Mother   . Asthma Mother   . Heart disease Father     No Known Allergies  Current Medications:   Current Outpatient Medications:  .  aspirin EC 81 MG tablet, Take 81 mg by mouth daily., Disp: , Rfl:  .  augmented betamethasone dipropionate (DIPROLENE-AF) 0.05 % cream, APPLY AS DIRECTED TO AFFECTED AREAS ON THE SKIN, Disp: 50 g, Rfl: 99 .  celecoxib (CELEBREX) 200 MG capsule, TAKE 1 CAPSULE BY MOUTH WITH FOOD TWICE A DAY 90 (Patient taking differently: Take 200 mg by mouth at bedtime as needed.), Disp: 180 capsule, Rfl: 2 .  clonazePAM (KLONOPIN) 0.5 MG tablet, TAKE 1 TABLET BY MOUTH EVERY NIGHT AT BEDTIME, Disp: 90 tablet, Rfl: 0 .  cyclobenzaprine (FLEXERIL) 10 MG tablet, Take 1 tablet by mouth 3 times daily as needed for muscle spasms, Disp: 50 tablet, Rfl: 1 .  DULoxetine (CYMBALTA) 30 MG capsule, TAKE 1 CAPSULE (30 MG TOTAL) BY MOUTH AT BEDTIME., Disp: 90 capsule, Rfl: 1 .  gabapentin (NEURONTIN) 300 MG capsule, Take 3 capsules (900 mg total) by mouth 3 (three) times daily., Disp: 810 capsule, Rfl: 3 .  HYDROcodone-acetaminophen (NORCO/VICODIN) 5-325 MG tablet, take 1 tablet by mouth every 6 hours as needed for pain, Disp: 30 tablet, Rfl: 0 .  levalbuterol (XOPENEX HFA) 45 MCG/ACT inhaler, Inhale 2 puffs into the lungs daily as needed for wheezing or shortness of breath., Disp: 1 Inhaler, Rfl: 1 .  loratadine (CLARITIN) 10 MG tablet, Take 10 mg by mouth daily., Disp: , Rfl:  .  metFORMIN (GLUCOPHAGE) 1000 MG tablet, TAKE ONE (1) TABLET BY MOUTH TWO (2) TIMES DAILY, Disp: 180 tablet, Rfl: 99 .  omeprazole (PRILOSEC) 20 MG capsule, TAKE 1 CAPSULE BY MOUTH 2 TIMES DAILY., Disp: 180 capsule, Rfl:  2 .  tamsulosin (FLOMAX) 0.4 MG CAPS capsule, TAKE 1 CAPSULE (0.4 MG TOTAL) BY MOUTH DAILY., Disp: 90 capsule, Rfl: 1 .  terbinafine (LAMISIL) 250 MG tablet, Take 1 tablet (250 mg total) by mouth daily., Disp: 90 tablet, Rfl: 0 .  tiotropium (SPIRIVA) 18 MCG inhalation capsule, PLACE ONE CAPSULE IN THE INHALER AND INHALE ONCE DAILY, Disp: 30 capsule, Rfl: 99 .  tiZANidine (ZANAFLEX) 4 MG tablet, TAKE 1 TABLET BY MOUTH EVERY 6 (SIX) HOURS AS NEEDED FOR MUSCLE SPASMS., Disp: 30 tablet, Rfl: 0 .  traMADol (ULTRAM) 50 MG tablet, Take 1 tablet by mouth every 8 hours  as needed, Disp: 50 tablet, Rfl: 1   Review of Systems:   ROS Negative unless otherwise specified per HPI. Vitals:   Vitals:   11/29/20 1344  BP: 136/88  Pulse: 89  Temp: 97.7 F (36.5 C)  TempSrc: Temporal  SpO2: 95%  Weight: 249 lb 8 oz (113.2 kg)  Height: 6\' 3"  (1.905 m)     Body mass index is 31.19 kg/m.  Physical Exam:   Physical Exam Vitals and nursing note reviewed.  Constitutional:      General: He is not in acute distress.    Appearance: He is well-developed. He is not ill-appearing or toxic-appearing.  Cardiovascular:     Rate and Rhythm: Normal rate and regular rhythm.     Pulses: Normal pulses.     Heart sounds: Normal heart sounds, S1 normal and S2 normal.  Pulmonary:     Effort: Pulmonary effort is normal.     Breath sounds: Normal breath sounds.  Musculoskeletal:     Lumbar back: No spasms or tenderness.     Right upper leg: No tenderness.     Comments: Impaired gait due to pain Using a walker to mobilize No TTP of R hip, lumbar or femur area  Skin:    General: Skin is warm and dry.  Neurological:     Mental Status: He is alert.     GCS: GCS eye subscore is 4. GCS verbal subscore is 5. GCS motor subscore is 6.  Psychiatric:        Speech: Speech normal.        Behavior: Behavior normal. Behavior is cooperative.    Assessment and Plan:   Diffuse large B-cell lymphoma, unspecified body  region [facility]); Right hip pain; Right leg pain Pain is currently uncontrolled Reviewed with PCP, Dr. Jerline Pain Will obtain stat MRI w/ and w/o contrast of lumbar spine, xray of R hip and R femur I have also prescribed 5 mg oxy IR as needed for breakthrough severe pain Further intervention based on results and clinical symptoms If worsening symptoms in the meantime, needs ER evaluation  I,Havlyn C Ratchford,acting as a scribe for [employer], PA.,have documented all relevant documentation on the behalf of Inda Coke, PA,as directed by  Inda Coke, PA while in the presence of Inda Coke, [place].   I, Inda Coke, [place], have reviewed all documentation for this visit. The documentation on 11/29/20 for the exam, diagnosis, procedures, and orders are all accurate and complete.   Inda Coke, PA-C

## 2020-11-29 NOTE — Patient Instructions (Signed)
It was great to see you!  We are getting an MRI of your lower back, xray of right hip and right thigh Oxycodone has been sent in  Me or Dr Jerline Pain will be in touch with your results  Take care,  Inda Coke PA-C

## 2020-11-30 ENCOUNTER — Encounter: Payer: Self-pay | Admitting: Family Medicine

## 2020-11-30 ENCOUNTER — Other Ambulatory Visit (HOSPITAL_COMMUNITY): Payer: Self-pay

## 2020-11-30 ENCOUNTER — Ambulatory Visit (HOSPITAL_COMMUNITY)
Admission: RE | Admit: 2020-11-30 | Discharge: 2020-11-30 | Disposition: A | Discharge status: Home / Self Care | Payer: BC Managed Care – PPO | Source: Ambulatory Visit | Attending: Physician Assistant | Admitting: Physician Assistant

## 2020-11-30 DIAGNOSIS — M25551 Pain in right hip: Secondary | ICD-10-CM

## 2020-11-30 DIAGNOSIS — C8330 Diffuse large B-cell lymphoma, unspecified site: Secondary | ICD-10-CM | POA: Insufficient documentation

## 2020-11-30 DIAGNOSIS — M79604 Pain in right leg: Secondary | ICD-10-CM | POA: Insufficient documentation

## 2020-11-30 MED ORDER — GADOBUTROL 1 MMOL/ML IV SOLN
10.0000 mL | Freq: Once | INTRAVENOUS | Status: AC | PRN
  Administered 2020-11-30: 10 mL via INTRAVENOUS

## 2020-12-02 ENCOUNTER — Other Ambulatory Visit (HOSPITAL_COMMUNITY): Payer: Self-pay

## 2020-12-02 MED ORDER — OXYCODONE-ACETAMINOPHEN 10-325 MG PO TABS
ORAL_TABLET | ORAL | 0 refills | Status: DC
  Filled 2020-12-02: qty 30, 5d supply, fill #0

## 2020-12-06 ENCOUNTER — Other Ambulatory Visit (HOSPITAL_COMMUNITY): Payer: Self-pay

## 2020-12-06 ENCOUNTER — Other Ambulatory Visit: Payer: Self-pay | Admitting: Neurosurgery

## 2020-12-06 MED ORDER — GABAPENTIN 300 MG PO CAPS
300.0000 mg | ORAL_CAPSULE | Freq: Three times a day (TID) | ORAL | 1 refills | Status: DC
  Filled 2020-12-06: qty 90, 30d supply, fill #0

## 2020-12-06 MED ORDER — GABAPENTIN 600 MG PO TABS
ORAL_TABLET | ORAL | 1 refills | Status: DC
  Filled 2020-12-06: qty 90, 30d supply, fill #0

## 2020-12-07 ENCOUNTER — Other Ambulatory Visit: Payer: Self-pay | Admitting: Family Medicine

## 2020-12-07 ENCOUNTER — Other Ambulatory Visit (HOSPITAL_COMMUNITY): Payer: Self-pay

## 2020-12-07 MED ORDER — OMEPRAZOLE 20 MG PO CPDR
DELAYED_RELEASE_CAPSULE | Freq: Two times a day (BID) | ORAL | 1 refills | Status: DC
  Filled 2020-12-07: qty 180, 90d supply, fill #0
  Filled 2021-03-07: qty 180, 90d supply, fill #1

## 2020-12-08 ENCOUNTER — Other Ambulatory Visit: Payer: Self-pay | Admitting: Neurosurgery

## 2020-12-08 ENCOUNTER — Encounter (HOSPITAL_COMMUNITY): Payer: Self-pay | Admitting: Neurosurgery

## 2020-12-08 ENCOUNTER — Other Ambulatory Visit: Payer: Self-pay

## 2020-12-08 NOTE — Anesthesia Preprocedure Evaluation (Deleted)
Anesthesia Evaluation    Airway        Dental   Pulmonary former smoker,           Cardiovascular hypertension,      Neuro/Psych    GI/Hepatic   Endo/Other  diabetes  Renal/GU      Musculoskeletal   Abdominal   Peds  Hematology   Anesthesia Other Findings   Reproductive/Obstetrics                             Anesthesia Physical Anesthesia Plan  ASA:   Anesthesia Plan:    Post-op Pain Management:    Induction:   PONV Risk Score and Plan:   Airway Management Planned:   Additional Equipment:   Intra-op Plan:   Post-operative Plan:   Informed Consent:   Plan Discussed with:   Anesthesia Plan Comments: (PAT note by Karoline Caldwell, PA-C:  Pertinent hx includes HTN, Asthma, COPD intolerant to CPAP, OSA, Lymphoma in remission, GERD, DMII (A1c 6.4 11/08/20). Follows with oncology, Dr. Benay Spice, for treatment of non-Hodgkin's lymphoma. He is s/p chemotherapy 2019 and remains in remission per most recent followup 09/01/20.  CMP and CBC from PCP visit 11/08/2020 reviewed, unremarkable.  Patient will need day of surgery evaluation.  TTE 02/08/2017: Study Conclusions  - Left ventricle: The cavity size was normal. There was mild concentric hypertrophy. Systolic function was normal. The estimated ejection fraction was in the range of 60% to 65%. Although no diagnostic regional wall motion abnormality was identified, this possibility cannot be completely excluded on the basis of this study. Doppler parameters are consistent with abnormal left ventricular relaxation (grade 1 diastolic dysfunction). - Ventricular septum: Septal motion showed "bounce". These changes are consistent with RV-LV interaction. - Aortic valve: Mildly calcified annulus. Trileaflet; normal thickness leaflets.  Complications: Echo was requested to be completed quickly for chemo and port  access.  Cath 12/07/2010: Impression: 1. No angiographic evidence of significant CAD to explain Chest pain or stress test results. 2. Non-cardiac / Non-anginal chest pain 3. Normal LVEF )        Anesthesia Quick Evaluation

## 2020-12-08 NOTE — Progress Notes (Signed)
Anesthesia Chart Review:  Pertinent hx includes HTN, Asthma, COPD intolerant to CPAP, OSA, Lymphoma in remission, GERD, DMII (A1c 6.4 11/08/20). Follows with oncology, Dr. Benay Spice, for treatment of non-Hodgkin's lymphoma. He is s/p chemotherapy 2019 and remains in remission per most recent followup 09/01/20.  CMP and CBC from PCP visit 11/08/2020 reviewed, unremarkable.  Patient will need day of surgery evaluation.  TTE 02/08/2017: Study Conclusions   - Left ventricle: The cavity size was normal. There was mild   concentric hypertrophy. Systolic function was normal. The   estimated ejection fraction was in the range of 60% to 65%.   Although no diagnostic regional wall motion abnormality was   identified, this possibility cannot be completely excluded on the   basis of this study. Doppler parameters are consistent with   abnormal left ventricular relaxation (grade 1 diastolic   dysfunction). - Ventricular septum: Septal motion showed &quot;bounce&quot;. These changes   are consistent with RV-LV interaction. - Aortic valve: Mildly calcified annulus. Trileaflet; normal   thickness leaflets.   Complications:  Echo was requested to be completed quickly for chemo and port access.   Cath 12/07/2010: Impression: 1. No angiographic evidence of significant CAD to explain Chest pain or stress test results. 2.  Non-cardiac / Non-anginal chest pain 3.  Normal LVEF    Wynonia Musty [facility] Short Stay Center/Anesthesiology Phone [phone] 12/08/2020 3:07 PM

## 2020-12-08 NOTE — Progress Notes (Signed)
DUE TO COVID-19 ONLY ONE VISITOR IS ALLOWED TO COME WITH YOU AND STAY IN THE WAITING ROOM ONLY DURING PRE OP AND PROCEDURE DAY OF SURGERY.   Two VISITORS  MAY VISIT WITH YOU AFTER SURGERY IN YOUR PRIVATE ROOM DURING VISITING HOURS ONLY!  PCP - Dr Dimas Chyle Cardiologist - n/a Oncology - Dr Betsy Coder  Chest x-ray - n/a EKG - DOS 12/09/20 Stress Test - n/a ECHO - 02/08/17 Cardiac Cath - 12/07/10  ICD Pacemaker/Loop - n/a  Sleep Study -  Yes CPAP - does not use anything for OSA  Fasting Blood Sugar - unknown Checks Blood Sugar 0 times a day  Do not take Metformin on the morning of surgery.  Aspirin Instructions: Follow your surgeon's instructions on when to stop aspirin prior to surgery,  If no instructions were given by your surgeon then you will need to call the office for those instructions.  Anesthesia review: Yes  STOP now taking any Aspirin (unless otherwise instructed by your surgeon), Aleve, Naproxen, Ibuprofen, Motrin, Advil, Goody's, BC's, all herbal medications, fish oil, and all vitamins.   Coronavirus Screening Covid test is scheduled on DOS Do you have any of the following symptoms:  Cough yes/no: No Fever (>100.57F)  yes/no: No Runny nose yes/no: No Sore throat yes/no: No Difficulty breathing/shortness of breath  yes/no: No  Have you traveled in the last 14 days and where? yes/no: No  Patient verbalized understanding of instructions that were given via phone.

## 2020-12-09 ENCOUNTER — Observation Stay (HOSPITAL_COMMUNITY): Payer: BC Managed Care – PPO

## 2020-12-09 ENCOUNTER — Ambulatory Visit (HOSPITAL_COMMUNITY)
Admission: RE | Admit: 2020-12-09 | Discharge: 2020-12-09 | Disposition: A | Discharge status: Home / Self Care | Payer: BC Managed Care – PPO | Attending: Neurosurgery | Admitting: Neurosurgery

## 2020-12-09 ENCOUNTER — Ambulatory Visit (HOSPITAL_COMMUNITY): Payer: BC Managed Care – PPO | Admitting: Physician Assistant

## 2020-12-09 ENCOUNTER — Encounter (HOSPITAL_COMMUNITY)
Admission: RE | Disposition: A | Discharge status: Home / Self Care | Payer: Self-pay | Source: Home / Self Care | Attending: Neurosurgery

## 2020-12-09 ENCOUNTER — Encounter (HOSPITAL_COMMUNITY): Payer: Self-pay | Admitting: Neurosurgery

## 2020-12-09 ENCOUNTER — Other Ambulatory Visit: Payer: Self-pay

## 2020-12-09 DIAGNOSIS — Z981 Arthrodesis status: Secondary | ICD-10-CM | POA: Diagnosis not present

## 2020-12-09 DIAGNOSIS — Z8572 Personal history of non-Hodgkin lymphomas: Secondary | ICD-10-CM | POA: Insufficient documentation

## 2020-12-09 DIAGNOSIS — Z87891 Personal history of nicotine dependence: Secondary | ICD-10-CM | POA: Diagnosis not present

## 2020-12-09 DIAGNOSIS — U071 COVID-19: Secondary | ICD-10-CM | POA: Insufficient documentation

## 2020-12-09 DIAGNOSIS — M5126 Other intervertebral disc displacement, lumbar region: Secondary | ICD-10-CM | POA: Diagnosis present

## 2020-12-09 DIAGNOSIS — J449 Chronic obstructive pulmonary disease, unspecified: Secondary | ICD-10-CM | POA: Insufficient documentation

## 2020-12-09 DIAGNOSIS — Z79899 Other long term (current) drug therapy: Secondary | ICD-10-CM | POA: Insufficient documentation

## 2020-12-09 DIAGNOSIS — Z419 Encounter for procedure for purposes other than remedying health state, unspecified: Secondary | ICD-10-CM

## 2020-12-09 DIAGNOSIS — R0789 Other chest pain: Secondary | ICD-10-CM | POA: Diagnosis not present

## 2020-12-09 DIAGNOSIS — M5116 Intervertebral disc disorders with radiculopathy, lumbar region: Secondary | ICD-10-CM | POA: Diagnosis present

## 2020-12-09 DIAGNOSIS — Z9221 Personal history of antineoplastic chemotherapy: Secondary | ICD-10-CM | POA: Insufficient documentation

## 2020-12-09 DIAGNOSIS — Z7984 Long term (current) use of oral hypoglycemic drugs: Secondary | ICD-10-CM | POA: Insufficient documentation

## 2020-12-09 DIAGNOSIS — E119 Type 2 diabetes mellitus without complications: Secondary | ICD-10-CM | POA: Diagnosis not present

## 2020-12-09 DIAGNOSIS — I10 Essential (primary) hypertension: Secondary | ICD-10-CM | POA: Insufficient documentation

## 2020-12-09 DIAGNOSIS — G4733 Obstructive sleep apnea (adult) (pediatric): Secondary | ICD-10-CM | POA: Diagnosis not present

## 2020-12-09 DIAGNOSIS — K219 Gastro-esophageal reflux disease without esophagitis: Secondary | ICD-10-CM | POA: Insufficient documentation

## 2020-12-09 DIAGNOSIS — M545 Low back pain, unspecified: Secondary | ICD-10-CM | POA: Diagnosis not present

## 2020-12-09 HISTORY — PX: LUMBAR LAMINECTOMY/DECOMPRESSION MICRODISCECTOMY: SHX5026

## 2020-12-09 HISTORY — DX: Other amnesia: R41.3

## 2020-12-09 LAB — GLUCOSE, CAPILLARY
Glucose-Capillary: 149 mg/dL — ABNORMAL HIGH (ref 70–99)
Glucose-Capillary: 143 mg/dL — ABNORMAL HIGH (ref 70–99)
Glucose-Capillary: 169 mg/dL — ABNORMAL HIGH (ref 70–99)
Glucose-Capillary: 240 mg/dL — ABNORMAL HIGH (ref 70–99)

## 2020-12-09 LAB — SARS CORONAVIRUS 2 BY RT PCR (HOSPITAL ORDER, PERFORMED IN ~~LOC~~ HOSPITAL LAB): SARS Coronavirus 2: POSITIVE — AB

## 2020-12-09 LAB — SURGICAL PCR SCREEN
MRSA, PCR: NEGATIVE
Staphylococcus aureus: NEGATIVE

## 2020-12-09 LAB — HEMOGLOBIN A1C
Hgb A1c MFr Bld: 6.4 % — ABNORMAL HIGH (ref 4.8–5.6)
Mean Plasma Glucose: 136.98 mg/dL

## 2020-12-09 SURGERY — LUMBAR LAMINECTOMY/DECOMPRESSION MICRODISCECTOMY 2 LEVELS
Anesthesia: General | Site: Back | Laterality: Right

## 2020-12-09 MED ORDER — OXYCODONE HCL 5 MG PO TABS: 10.0000 mg | ORAL_TABLET | ORAL | Status: DC | PRN

## 2020-12-09 MED ORDER — OXYCODONE HCL 5 MG PO TABS: 5.0000 mg | ORAL_TABLET | ORAL | Status: DC | PRN

## 2020-12-09 MED ORDER — ONDANSETRON HCL 4 MG/2ML IJ SOLN
INTRAMUSCULAR | Status: DC | PRN
  Administered 2020-12-09: 4 mg via INTRAVENOUS

## 2020-12-09 MED ORDER — DULOXETINE HCL 20 MG PO CPEP
20.0000 mg | ORAL_CAPSULE | Freq: Every day | ORAL | Status: DC
  Filled 2020-12-09: qty 1

## 2020-12-09 MED ORDER — AMISULPRIDE (ANTIEMETIC) 5 MG/2ML IV SOLN: 10.0000 mg | Freq: Once | INTRAVENOUS | Status: DC | PRN

## 2020-12-09 MED ORDER — SUCCINYLCHOLINE CHLORIDE 200 MG/10ML IV SOSY
PREFILLED_SYRINGE | INTRAVENOUS | Status: AC
  Filled 2020-12-09: qty 10

## 2020-12-09 MED ORDER — CHLORHEXIDINE GLUCONATE CLOTH 2 % EX PADS: 6.0000 | MEDICATED_PAD | Freq: Once | CUTANEOUS | Status: DC

## 2020-12-09 MED ORDER — ACETAMINOPHEN 500 MG PO TABS
1000.0000 mg | ORAL_TABLET | Freq: Four times a day (QID) | ORAL | Status: DC
  Administered 2020-12-09: 1000 mg via ORAL
  Filled 2020-12-09 (×2): qty 2

## 2020-12-09 MED ORDER — THROMBIN 5000 UNITS EX SOLR
CUTANEOUS | Status: AC
  Filled 2020-12-09: qty 5000

## 2020-12-09 MED ORDER — INSULIN ASPART 100 UNIT/ML IJ SOLN
0.0000 [IU] | INTRAMUSCULAR | Status: DC
Start: 2020-12-09 — End: 2020-12-10
  Administered 2020-12-09: 4 [IU] via SUBCUTANEOUS
  Administered 2020-12-09: 7 [IU] via SUBCUTANEOUS

## 2020-12-09 MED ORDER — CYCLOBENZAPRINE HCL 10 MG PO TABS: 10.0000 mg | ORAL_TABLET | Freq: Three times a day (TID) | ORAL | Status: DC | PRN

## 2020-12-09 MED ORDER — FENTANYL CITRATE (PF) 100 MCG/2ML IJ SOLN
25.0000 ug | INTRAMUSCULAR | Status: DC | PRN
  Administered 2020-12-09 (×3): 50 ug via INTRAVENOUS

## 2020-12-09 MED ORDER — ONDANSETRON HCL 4 MG PO TABS: 4.0000 mg | ORAL_TABLET | Freq: Four times a day (QID) | ORAL | Status: DC | PRN

## 2020-12-09 MED ORDER — LEVALBUTEROL HCL 0.63 MG/3ML IN NEBU: 0.6300 mg | INHALATION_SOLUTION | Freq: Every day | RESPIRATORY_TRACT | Status: DC | PRN

## 2020-12-09 MED ORDER — BUPIVACAINE-EPINEPHRINE 0.5% -1:200000 IJ SOLN
INTRAMUSCULAR | Status: AC
  Filled 2020-12-09: qty 1

## 2020-12-09 MED ORDER — DEXAMETHASONE SODIUM PHOSPHATE 10 MG/ML IJ SOLN
INTRAMUSCULAR | Status: AC
  Filled 2020-12-09: qty 1

## 2020-12-09 MED ORDER — TAMSULOSIN HCL 0.4 MG PO CAPS: 0.4000 mg | ORAL_CAPSULE | Freq: Every day | ORAL | Status: DC

## 2020-12-09 MED ORDER — ROCURONIUM BROMIDE 10 MG/ML (PF) SYRINGE
PREFILLED_SYRINGE | INTRAVENOUS | Status: DC | PRN
  Administered 2020-12-09: 50 mg via INTRAVENOUS
  Administered 2020-12-09: 20 mg via INTRAVENOUS

## 2020-12-09 MED ORDER — ACETAMINOPHEN 650 MG RE SUPP: 650.0000 mg | RECTAL | Status: DC | PRN

## 2020-12-09 MED ORDER — METFORMIN HCL 500 MG PO TABS
500.0000 mg | ORAL_TABLET | Freq: Two times a day (BID) | ORAL | Status: DC
  Administered 2020-12-09: 500 mg via ORAL
  Filled 2020-12-09: qty 1

## 2020-12-09 MED ORDER — OXYCODONE-ACETAMINOPHEN 10-325 MG PO TABS
1.0000 | ORAL_TABLET | ORAL | 0 refills | Status: DC | PRN
Start: 2020-12-09 — End: 2022-01-03

## 2020-12-09 MED ORDER — CYCLOBENZAPRINE HCL 10 MG PO TABS: 10.0000 mg | ORAL_TABLET | Freq: Three times a day (TID) | ORAL | 1 refills | Status: DC | PRN

## 2020-12-09 MED ORDER — FENTANYL CITRATE (PF) 100 MCG/2ML IJ SOLN: 100.0000 ug | Freq: Once | INTRAMUSCULAR | Status: AC

## 2020-12-09 MED ORDER — OXYCODONE HCL 5 MG PO TABS: 15.0000 mg | ORAL_TABLET | ORAL | Status: DC | PRN

## 2020-12-09 MED ORDER — PROPOFOL 10 MG/ML IV BOLUS
INTRAVENOUS | Status: DC | PRN
  Administered 2020-12-09: 160 mg via INTRAVENOUS
  Administered 2020-12-09: 40 mg via INTRAVENOUS

## 2020-12-09 MED ORDER — CELECOXIB 200 MG PO CAPS: 200.0000 mg | ORAL_CAPSULE | Freq: Every day | ORAL | Status: DC

## 2020-12-09 MED ORDER — SUGAMMADEX SODIUM 200 MG/2ML IV SOLN
INTRAVENOUS | Status: DC | PRN
  Administered 2020-12-09: 300 mg via INTRAVENOUS

## 2020-12-09 MED ORDER — SODIUM CHLORIDE 0.9% FLUSH: 3.0000 mL | INTRAVENOUS | Status: DC | PRN

## 2020-12-09 MED ORDER — PANTOPRAZOLE SODIUM 40 MG PO TBEC
40.0000 mg | DELAYED_RELEASE_TABLET | Freq: Every day | ORAL | Status: DC
  Administered 2020-12-09: 40 mg via ORAL
  Filled 2020-12-09: qty 1

## 2020-12-09 MED ORDER — ALBUTEROL SULFATE (2.5 MG/3ML) 0.083% IN NEBU: 2.5000 mg | INHALATION_SOLUTION | Freq: Once | RESPIRATORY_TRACT | Status: AC

## 2020-12-09 MED ORDER — LACTATED RINGERS IV SOLN: INTRAVENOUS | Status: DC

## 2020-12-09 MED ORDER — CEFAZOLIN SODIUM-DEXTROSE 2-4 GM/100ML-% IV SOLN: 2.0000 g | Freq: Three times a day (TID) | INTRAVENOUS | Status: DC

## 2020-12-09 MED ORDER — ONDANSETRON HCL 4 MG/2ML IJ SOLN: 4.0000 mg | Freq: Four times a day (QID) | INTRAMUSCULAR | Status: DC | PRN

## 2020-12-09 MED ORDER — PROMETHAZINE HCL 25 MG/ML IJ SOLN: 6.2500 mg | INTRAMUSCULAR | Status: DC | PRN

## 2020-12-09 MED ORDER — GABAPENTIN 300 MG PO CAPS: 300.0000 mg | ORAL_CAPSULE | Freq: Three times a day (TID) | ORAL | Status: DC

## 2020-12-09 MED ORDER — LIDOCAINE 2% (20 MG/ML) 5 ML SYRINGE
INTRAMUSCULAR | Status: AC
  Filled 2020-12-09: qty 5

## 2020-12-09 MED ORDER — MORPHINE SULFATE (PF) 4 MG/ML IV SOLN: 4.0000 mg | INTRAVENOUS | Status: DC | PRN

## 2020-12-09 MED ORDER — FENTANYL CITRATE (PF) 100 MCG/2ML IJ SOLN
INTRAMUSCULAR | Status: AC
  Administered 2020-12-09: 100 ug via INTRAVENOUS
  Filled 2020-12-09: qty 2

## 2020-12-09 MED ORDER — PHENYLEPHRINE 40 MCG/ML (10ML) SYRINGE FOR IV PUSH (FOR BLOOD PRESSURE SUPPORT)
PREFILLED_SYRINGE | INTRAVENOUS | Status: DC | PRN
  Administered 2020-12-09: 80 ug via INTRAVENOUS
  Administered 2020-12-09: 120 ug via INTRAVENOUS
  Administered 2020-12-09: 80 ug via INTRAVENOUS

## 2020-12-09 MED ORDER — ACETAMINOPHEN 325 MG PO TABS: 650.0000 mg | ORAL_TABLET | ORAL | Status: DC | PRN

## 2020-12-09 MED ORDER — MIDAZOLAM HCL 2 MG/2ML IJ SOLN
INTRAMUSCULAR | Status: DC | PRN
  Administered 2020-12-09: 2 mg via INTRAVENOUS

## 2020-12-09 MED ORDER — LIDOCAINE 2% (20 MG/ML) 5 ML SYRINGE
INTRAMUSCULAR | Status: DC | PRN
  Administered 2020-12-09: 100 mg via INTRAVENOUS

## 2020-12-09 MED ORDER — CELECOXIB 200 MG PO CAPS
200.0000 mg | ORAL_CAPSULE | Freq: Once | ORAL | Status: AC
  Administered 2020-12-09: 200 mg via ORAL
  Filled 2020-12-09: qty 1

## 2020-12-09 MED ORDER — CEFAZOLIN SODIUM-DEXTROSE 2-4 GM/100ML-% IV SOLN
INTRAVENOUS | Status: AC
  Filled 2020-12-09: qty 100

## 2020-12-09 MED ORDER — FENTANYL CITRATE (PF) 100 MCG/2ML IJ SOLN
INTRAMUSCULAR | Status: AC
  Filled 2020-12-09: qty 2

## 2020-12-09 MED ORDER — TIZANIDINE HCL 4 MG PO TABS: 4.0000 mg | ORAL_TABLET | Freq: Four times a day (QID) | ORAL | Status: DC | PRN

## 2020-12-09 MED ORDER — LEVALBUTEROL TARTRATE 45 MCG/ACT IN AERO: 2.0000 | INHALATION_SPRAY | Freq: Every day | RESPIRATORY_TRACT | Status: DC | PRN

## 2020-12-09 MED ORDER — MENTHOL 3 MG MT LOZG: 1.0000 | LOZENGE | OROMUCOSAL | Status: DC | PRN

## 2020-12-09 MED ORDER — UMECLIDINIUM BROMIDE 62.5 MCG/ACT IN AEPB
1.0000 | INHALATION_SPRAY | Freq: Every day | RESPIRATORY_TRACT | Status: DC
  Filled 2020-12-09: qty 7

## 2020-12-09 MED ORDER — MIDAZOLAM HCL 2 MG/2ML IJ SOLN
INTRAMUSCULAR | Status: AC
  Filled 2020-12-09: qty 2

## 2020-12-09 MED ORDER — DOCUSATE SODIUM 100 MG PO CAPS: 100.0000 mg | ORAL_CAPSULE | Freq: Two times a day (BID) | ORAL | Status: DC

## 2020-12-09 MED ORDER — FENTANYL CITRATE (PF) 250 MCG/5ML IJ SOLN
INTRAMUSCULAR | Status: AC
  Filled 2020-12-09: qty 5

## 2020-12-09 MED ORDER — PHENYLEPHRINE 40 MCG/ML (10ML) SYRINGE FOR IV PUSH (FOR BLOOD PRESSURE SUPPORT)
PREFILLED_SYRINGE | INTRAVENOUS | Status: AC
  Filled 2020-12-09: qty 10

## 2020-12-09 MED ORDER — GABAPENTIN 300 MG PO CAPS: 900.0000 mg | ORAL_CAPSULE | Freq: Three times a day (TID) | ORAL | Status: DC

## 2020-12-09 MED ORDER — CEFAZOLIN SODIUM-DEXTROSE 2-4 GM/100ML-% IV SOLN
2.0000 g | INTRAVENOUS | Status: AC
  Administered 2020-12-09: 2 g via INTRAVENOUS

## 2020-12-09 MED ORDER — TERBINAFINE HCL 250 MG PO TABS
250.0000 mg | ORAL_TABLET | Freq: Every day | ORAL | Status: DC
  Filled 2020-12-09: qty 1

## 2020-12-09 MED ORDER — CHLORHEXIDINE GLUCONATE 0.12 % MT SOLN: 15.0000 mL | Freq: Once | OROMUCOSAL | Status: AC

## 2020-12-09 MED ORDER — LORATADINE 10 MG PO TABS: 10.0000 mg | ORAL_TABLET | Freq: Every day | ORAL | Status: DC

## 2020-12-09 MED ORDER — BISACODYL 10 MG RE SUPP: 10.0000 mg | Freq: Every day | RECTAL | Status: DC | PRN

## 2020-12-09 MED ORDER — ALBUTEROL SULFATE (2.5 MG/3ML) 0.083% IN NEBU
INHALATION_SOLUTION | RESPIRATORY_TRACT | Status: AC
  Administered 2020-12-09: 2.5 mg via RESPIRATORY_TRACT
  Filled 2020-12-09: qty 3

## 2020-12-09 MED ORDER — TIOTROPIUM BROMIDE MONOHYDRATE 18 MCG IN CAPS: 18.0000 ug | ORAL_CAPSULE | Freq: Every day | RESPIRATORY_TRACT | Status: DC

## 2020-12-09 MED ORDER — CHLORHEXIDINE GLUCONATE 0.12 % MT SOLN
OROMUCOSAL | Status: AC
  Administered 2020-12-09: 15 mL via OROMUCOSAL
  Filled 2020-12-09: qty 15

## 2020-12-09 MED ORDER — DEXAMETHASONE SODIUM PHOSPHATE 10 MG/ML IJ SOLN
INTRAMUSCULAR | Status: DC | PRN
  Administered 2020-12-09: 10 mg via INTRAVENOUS

## 2020-12-09 MED ORDER — PHENOL 1.4 % MT LIQD: 1.0000 | OROMUCOSAL | Status: DC | PRN

## 2020-12-09 MED ORDER — SUCCINYLCHOLINE CHLORIDE 200 MG/10ML IV SOSY
PREFILLED_SYRINGE | INTRAVENOUS | Status: DC | PRN
  Administered 2020-12-09: 160 mg via INTRAVENOUS

## 2020-12-09 MED ORDER — BACITRACIN ZINC 500 UNIT/GM EX OINT
TOPICAL_OINTMENT | CUTANEOUS | Status: AC
  Filled 2020-12-09: qty 28.35

## 2020-12-09 MED ORDER — EPHEDRINE 5 MG/ML INJ
INTRAVENOUS | Status: AC
  Filled 2020-12-09: qty 5

## 2020-12-09 MED ORDER — BACITRACIN ZINC 500 UNIT/GM EX OINT
TOPICAL_OINTMENT | CUTANEOUS | Status: DC | PRN
  Administered 2020-12-09: 1 via TOPICAL

## 2020-12-09 MED ORDER — ACETAMINOPHEN 500 MG PO TABS
1000.0000 mg | ORAL_TABLET | Freq: Once | ORAL | Status: AC
  Administered 2020-12-09: 1000 mg via ORAL
  Filled 2020-12-09: qty 2

## 2020-12-09 MED ORDER — FENTANYL CITRATE (PF) 100 MCG/2ML IJ SOLN
INTRAMUSCULAR | Status: DC | PRN
  Administered 2020-12-09 (×2): 100 ug via INTRAVENOUS
  Administered 2020-12-09: 50 ug via INTRAVENOUS

## 2020-12-09 MED ORDER — CLONAZEPAM 0.25 MG PO TBDP: 0.2500 mg | ORAL_TABLET | Freq: Every day | ORAL | Status: DC

## 2020-12-09 MED ORDER — THROMBIN 5000 UNITS EX SOLR: OROMUCOSAL | Status: DC | PRN

## 2020-12-09 MED ORDER — BUPIVACAINE-EPINEPHRINE (PF) 0.5% -1:200000 IJ SOLN
INTRAMUSCULAR | Status: DC | PRN
  Administered 2020-12-09 (×2): 10 mL

## 2020-12-09 MED ORDER — DOCUSATE SODIUM 100 MG PO CAPS: 100.0000 mg | ORAL_CAPSULE | Freq: Two times a day (BID) | ORAL | 0 refills | Status: DC

## 2020-12-09 MED ORDER — HYDROCODONE-ACETAMINOPHEN 5-325 MG PO TABS
1.0000 | ORAL_TABLET | ORAL | Status: DC | PRN
Start: 2020-12-09 — End: 2020-12-10
  Administered 2020-12-09: 2 via ORAL
  Filled 2020-12-09: qty 2

## 2020-12-09 MED ORDER — SODIUM CHLORIDE 0.9% FLUSH: 3.0000 mL | Freq: Two times a day (BID) | INTRAVENOUS | Status: DC

## 2020-12-09 MED ORDER — ONDANSETRON HCL 4 MG/2ML IJ SOLN
INTRAMUSCULAR | Status: AC
  Filled 2020-12-09: qty 2

## 2020-12-09 MED ORDER — SODIUM CHLORIDE 0.9 % IV SOLN: 250.0000 mL | INTRAVENOUS | Status: DC

## 2020-12-09 MED ORDER — PHENYLEPHRINE HCL-NACL 20-0.9 MG/250ML-% IV SOLN
INTRAVENOUS | Status: DC | PRN
  Administered 2020-12-09: 50 ug/min via INTRAVENOUS

## 2020-12-09 MED ORDER — ORAL CARE MOUTH RINSE: 15.0000 mL | Freq: Once | OROMUCOSAL | Status: AC

## 2020-12-09 MED ORDER — 0.9 % SODIUM CHLORIDE (POUR BTL) OPTIME
TOPICAL | Status: DC | PRN
  Administered 2020-12-09: 1000 mL

## 2020-12-09 MED ORDER — ROCURONIUM BROMIDE 10 MG/ML (PF) SYRINGE
PREFILLED_SYRINGE | INTRAVENOUS | Status: AC
  Filled 2020-12-09: qty 10

## 2020-12-09 SURGICAL SUPPLY — 49 items
APL SKNCLS STERI-STRIP NONHPOA (GAUZE/BANDAGES/DRESSINGS) ×1
BAG COUNTER SPONGE SURGICOUNT (BAG) ×2 IMPLANT
BAG SPNG CNTER NS LX DISP (BAG) ×1
BAND INSRT 18 STRL LF DISP RB (MISCELLANEOUS) ×2
BAND RUBBER #18 3X1/16 STRL (MISCELLANEOUS) ×4 IMPLANT
BENZOIN TINCTURE PRP APPL 2/3 (GAUZE/BANDAGES/DRESSINGS) ×2 IMPLANT
BLADE CLIPPER SURG (BLADE) ×2 IMPLANT
BUR MATCHSTICK NEURO 3.0 LAGG (BURR) ×2 IMPLANT
BUR PRECISION FLUTE 6.0 (BURR) ×2 IMPLANT
CANISTER SUCT 3000ML PPV (MISCELLANEOUS) ×2 IMPLANT
CARTRIDGE OIL MAESTRO DRILL (MISCELLANEOUS) ×1 IMPLANT
DIFFUSER DRILL AIR PNEUMATIC (MISCELLANEOUS) ×2 IMPLANT
DRAPE LAPAROTOMY 100X72X124 (DRAPES) ×2 IMPLANT
DRAPE MICROSCOPE LEICA (MISCELLANEOUS) ×2 IMPLANT
DRAPE SURG 17X23 STRL (DRAPES) ×4 IMPLANT
DRSG OPSITE POSTOP 4X6 (GAUZE/BANDAGES/DRESSINGS) ×2 IMPLANT
ELECT BLADE 4.0 EZ CLEAN MEGAD (MISCELLANEOUS) ×2
ELECT REM PT RETURN 9FT ADLT (ELECTROSURGICAL) ×2
ELECTRODE BLDE 4.0 EZ CLN MEGD (MISCELLANEOUS) ×1 IMPLANT
ELECTRODE REM PT RTRN 9FT ADLT (ELECTROSURGICAL) ×1 IMPLANT
GAUZE 4X4 16PLY ~~LOC~~+RFID DBL (SPONGE) ×1 IMPLANT
GAUZE SPONGE 4X4 12PLY STRL (GAUZE/BANDAGES/DRESSINGS) ×1 IMPLANT
GLOVE EXAM NITRILE XL STR (GLOVE) IMPLANT
GLOVE SURG ENC MOIS LTX SZ8 (GLOVE) ×2 IMPLANT
GLOVE SURG ENC MOIS LTX SZ8.5 (GLOVE) ×2 IMPLANT
GOWN STRL REUS W/ TWL LRG LVL3 (GOWN DISPOSABLE) IMPLANT
GOWN STRL REUS W/ TWL XL LVL3 (GOWN DISPOSABLE) ×1 IMPLANT
GOWN STRL REUS W/TWL 2XL LVL3 (GOWN DISPOSABLE) IMPLANT
GOWN STRL REUS W/TWL LRG LVL3 (GOWN DISPOSABLE) ×4
GOWN STRL REUS W/TWL XL LVL3 (GOWN DISPOSABLE) ×2
HEMOSTAT POWDER KIT SURGIFOAM (HEMOSTASIS) ×1 IMPLANT
KIT BASIN OR (CUSTOM PROCEDURE TRAY) ×2 IMPLANT
KIT TURNOVER KIT B (KITS) ×2 IMPLANT
NDL HYPO 21X1.5 SAFETY (NEEDLE) IMPLANT
NEEDLE HYPO 21X1.5 SAFETY (NEEDLE) IMPLANT
NEEDLE HYPO 22GX1.5 SAFETY (NEEDLE) ×2 IMPLANT
NS IRRIG 1000ML POUR BTL (IV SOLUTION) ×2 IMPLANT
OIL CARTRIDGE MAESTRO DRILL (MISCELLANEOUS) ×2
PACK LAMINECTOMY NEURO (CUSTOM PROCEDURE TRAY) ×2 IMPLANT
PAD ARMBOARD 7.5X6 YLW CONV (MISCELLANEOUS) ×6 IMPLANT
PATTIES SURGICAL .5 X1 (DISPOSABLE) IMPLANT
SPONGE SURGIFOAM ABS GEL SZ50 (HEMOSTASIS) ×1 IMPLANT
STRIP CLOSURE SKIN 1/2X4 (GAUZE/BANDAGES/DRESSINGS) ×2 IMPLANT
SUT VIC AB 1 CT1 18XBRD ANBCTR (SUTURE) ×2 IMPLANT
SUT VIC AB 1 CT1 8-18 (SUTURE) ×4
SUT VIC AB 2-0 CP2 18 (SUTURE) ×4 IMPLANT
TOWEL GREEN STERILE (TOWEL DISPOSABLE) ×2 IMPLANT
TOWEL GREEN STERILE FF (TOWEL DISPOSABLE) ×2 IMPLANT
WATER STERILE IRR 1000ML POUR (IV SOLUTION) ×2 IMPLANT

## 2020-12-09 NOTE — Anesthesia Postprocedure Evaluation (Signed)
Anesthesia Post Note  Patient: Jeffery Wood  Procedure(s) Performed: MICRODISCECTOMY, RIGHT LUMBAR TWO-THREE, LUMBAR THREE-FOUR (Right: Back)     Patient location during evaluation: PACU Anesthesia Type: General Level of consciousness: sedated Pain management: pain level controlled Vital Signs Assessment: post-procedure vital signs reviewed and stable Respiratory status: spontaneous breathing and respiratory function stable Cardiovascular status: stable Postop Assessment: no apparent nausea or vomiting Anesthetic complications: no   No notable events documented.  Last Vitals:  Vitals:   12/09/20 1453 12/09/20 1500  BP:  130/73  Pulse: 84 82  Resp: 16 20  Temp:    SpO2: 96% 94%    Last Pain:  Vitals:   12/09/20 1500  TempSrc:   PainSc: 3                  [name]

## 2020-12-09 NOTE — Discharge Instructions (Signed)
Wound Care Keep incision covered and dry for two days.    Do not put any creams, lotions, or ointments on incision. Leave steri-strips on back.  They will fall off by themselves.  Activity Walk each and every day, increasing distance each day. No lifting greater than 5 lbs.  Avoid excessive back motion. No driving for 2 weeks; may ride as a passenger locally.  Diet Resume your normal diet.   Return to Work Will be discussed at your follow up appointment.  Call Your Doctor If Any of These Occur Redness, drainage, or swelling at the wound.  Temperature greater than 101 degrees. Severe pain not relieved by pain medication. Incision starts to come apart.  Follow Up Appt Call today for appointment in 3 weeks [phone]) or for problems.

## 2020-12-09 NOTE — Discharge Summary (Signed)
Physician Discharge Summary     Providing Compassionate, Quality Care - Together   Patient ID: Jeffery Wood MRN: [id number] DOB/AGE: [id number] y.o.  Admit date: 12/09/2020 Discharge date: 12/09/2020  Admission Diagnoses: Lumbar disc herniation with radiculopathy  Discharge Diagnoses:  Active Problems:   Lumbar disc herniation with radiculopathy   Lumbar herniated disc   Discharged Condition: good  Hospital Course: Patient underwent an L2-3 and L3-4 discectomy by Dr. Arnoldo Morale on 12/09/2020. He was admitted to [facility] following recovery from anesthesia in the PACU. His postoperative course has been uncomplicated. He is ambulating independently and without difficulty. He is tolerating a normal diet. He is not having any bowel or bladder dysfunction. His pain is well-controlled with oral pain medication. He is ready for discharge home.   Consults: None  Significant Diagnostic Studies: radiology: DG Lumbar Spine 1 View  Result Date: 12/09/2020 CLINICAL DATA:  Microdiscectomy, right L2-L3, L3-L4 EXAM: LUMBAR SPINE - 1 VIEW COMPARISON:  11/19/2020. FINDINGS: Single lateral radiograph of the lumbar spine. Posterior fixation hardware with interbody disc spacer at L4-L5. Needle posterior to the L3-L4 disc space. No acute fracture. IMPRESSION: Needle posterior to the L3-L4 disc space. Electronically Signed   By: Merilyn Baba M.D.   On: 12/09/2020 15:26     Treatments: surgery: Right L2-3 and L3-4 intervertebral discectomy using micro-dissection  Discharge Exam: Blood pressure 119/77, pulse 94, temperature 97.9 F (36.6 C), temperature source Oral, resp. rate 16, height 6\' 3"  (1.905 m), weight 111.1 kg, SpO2 96 %.  Per report: Alert and oriented x 4 MAE, Strength grossly intact Incision is covered with Honeycomb dressing and Steri Strips; Dressing is clean, dry, and intact   Disposition: Discharge disposition: 01-Home or Self Care        Allergies as of 12/09/2020   No  Known Allergies      Medication List     STOP taking these medications    HYDROcodone-acetaminophen 5-325 MG tablet Commonly known as: NORCO/VICODIN   oxyCODONE 5 MG immediate release tablet Commonly known as: Oxy IR/ROXICODONE   tiZANidine 4 MG tablet Commonly known as: ZANAFLEX   traMADol 50 MG tablet Commonly known as: ULTRAM       TAKE these medications    aspirin EC 81 MG tablet Take 81 mg by mouth daily.   augmented betamethasone dipropionate 0.05 % cream Commonly known as: DIPROLENE-AF APPLY AS DIRECTED TO AFFECTED AREAS ON THE SKIN What changed:  how much to take how to take this when to take this reasons to take this   celecoxib 200 MG capsule Commonly known as: CELEBREX Take 1 capsule (200 mg total) by mouth daily with food   clonazePAM 0.5 MG tablet Commonly known as: KLONOPIN TAKE 1 TABLET BY MOUTH EVERY NIGHT AT BEDTIME   cyclobenzaprine 10 MG tablet Commonly known as: FLEXERIL Take 1 tablet (10 mg total) by mouth 3 (three) times daily as needed for muscle spasms. What changed:  how much to take how to take this when to take this reasons to take this   docusate sodium 100 MG capsule Commonly known as: COLACE Take 1 capsule (100 mg total) by mouth 2 (two) times daily.   DULoxetine 30 MG capsule Commonly known as: CYMBALTA TAKE 1 CAPSULE (30 MG TOTAL) BY MOUTH AT BEDTIME.   gabapentin 300 MG capsule Commonly known as: NEURONTIN Take 3 capsules (900 mg total) by mouth 3 (three) times daily. What changed: Another medication with the same name was removed. Continue taking this medication,  and follow the directions you see here.   levalbuterol 45 MCG/ACT inhaler Commonly known as: XOPENEX HFA Inhale 2 puffs into the lungs daily as needed for wheezing or shortness of breath.   loratadine 10 MG tablet Commonly known as: CLARITIN Take 10 mg by mouth daily.   metFORMIN 1000 MG tablet Commonly known as: GLUCOPHAGE TAKE ONE (1) TABLET BY  MOUTH TWO (2) TIMES DAILY   omeprazole 20 MG capsule Commonly known as: PRILOSEC TAKE 1 CAPSULE BY MOUTH 2 TIMES DAILY.   oxyCODONE-acetaminophen 10-325 MG tablet Commonly known as: PERCOCET Take 1 tablet by mouth every 4 (four) hours as needed for pain. What changed:  how much to take how to take this when to take this reasons to take this   Spiriva HandiHaler 18 MCG inhalation capsule Generic drug: tiotropium PLACE ONE CAPSULE IN THE INHALER AND INHALE ONCE DAILY   tamsulosin 0.4 MG Caps capsule Commonly known as: FLOMAX TAKE 1 CAPSULE (0.4 MG TOTAL) BY MOUTH DAILY.   terbinafine 250 MG tablet Commonly known as: LAMISIL Take 1 tablet (250 mg total) by mouth daily.        Follow-up Information     Newman Pies, MD. Schedule an appointment as soon as possible for a visit in 3 week(s).   Specialty: Neurosurgery Contact information: [address] [place] [address] [phone]                 Signed: Viona Gilmore, DNP, AGNP-C Nurse Practitioner  [facility] [place] [address] [place], [place], [place] [address] P: [phone]    F: [phone]  12/09/2020, 7:45 PM

## 2020-12-09 NOTE — Op Note (Signed)
Brief history: The patient is a 65 year old Jeffery Wood male on whom I previously formed an L4-5 decompression and fusion.  He did well until recently we developed severe right leg pain.  He failed medical management and was worked up with a lumbar MRI which demonstrated a large herniated disc on the right at L2-3 and L3-4.  I discussed the various treatment options.  He has decided proceed with surgery.  Preoperative diagnosis: Right L2-3 and L3-4 herniated disc, lumbar radiculopathy, lumbago  Postoperative diagnosis: The same  Procedure: Right L2-3 and L3-4 intervertebral discectomy using micro-dissection  Surgeon: Dr. Earle Gell  Asst.: None  Anesthesia: Gen. endotracheal  Estimated blood loss: 225 cc  Drains: None  Complications: None  Description of procedure: The patient was brought to the operating room by the anesthesia team. General endotracheal anesthesia was induced. The patient was turned to the prone position on the Wilson frame. The patient's lumbosacral region was then prepared with Betadine scrub and Betadine solution. Sterile drapes were applied.  I then injected the area to be incised with Marcaine with epinephrine solution. I then used a scalpel to make a linear midline incision over the L2-3 and L3-4 intervertebral disc space. I then used electrocautery to perform a right sided subperiosteal dissection exposing the spinous process and lamina of L2, L3 and L4. We obtained intraoperative radiograph to confirm our location. I then inserted the [facility] retractor for exposure.  We then brought the operative microscope into the field. Under its magnification and illumination we completed the microdissection. I used a high-speed drill to perform a laminotomy at L2-3 and L3-4 on the right. I then used a Kerrison punches to widen the laminotomy and removed the ligamentum flavum at L2-3 and L3-4 and a right. We then used microdissection to free up the thecal sac and the right L3 and  L4 nerve root from the epidural tissue. I then used a Kerrison punch to perform a foraminotomy at about the right L4 nerve root. We then using the nerve root retractor to gently retract the thecal sac medially. This exposed the intervertebral disc.  In the axilla of the exiting L3 nerve root.  We removed it with the pituitary forceps.  I inspected intervertebral disc at L2-3 and L3-4.  I did not see any impending herniations and did not perform an intervertebral discectomy.  I then palpated along the ventral surface of the thecal sac and along exit route of the right L3 and L4 nerve root and noted that the neural structures were well decompressed. This completed the decompression.  We then obtained hemostasis using bipolar electrocautery. We irrigated the wound out with bacitracin solution. We then removed the retractor. We then reapproximated the patient's thoracolumbar fascia with interrupted #1 Vicryl suture. We then reapproximated the patient's subcutaneous tissue with interrupted 2-0 Vicryl suture. We then reapproximated patient's skin with Steri-Strips and benzoin. The was then coated with bacitracin ointment. The drapes were removed. The patient was subsequently returned to the supine position where they were extubated by the anesthesia team. The patient was then transported to the postanesthesia care unit in stable condition. All sponge instrument and needle counts were reportedly correct at the end of this case.

## 2020-12-09 NOTE — Anesthesia Procedure Notes (Signed)
Procedure Name: Intubation Date/Time: 12/09/2020 11:57 AM Performed by: Genelle Bal, CRNA Pre-anesthesia Checklist: Patient identified, Emergency Drugs available, Suction available and Patient being monitored Patient Re-evaluated:Patient Re-evaluated prior to induction Oxygen Delivery Method: Circle system utilized Preoxygenation: Pre-oxygenation with 100% oxygen Induction Type: IV induction and Rapid sequence Laryngoscope Size: Miller and 2 Grade View: Grade I Tube type: Oral Tube size: 7.5 mm Number of attempts: 1 Airway Equipment and Method: Stylet and Oral airway Placement Confirmation: ETT inserted through vocal cords under direct vision, positive ETCO2 and breath sounds checked- equal and bilateral Secured at: 22 cm Tube secured with: Tape Dental Injury: Teeth and Oropharynx as per pre-operative assessment

## 2020-12-09 NOTE — Progress Notes (Signed)
Lab called staff with a positive COVID test. Dr. Arnoldo Morale and Dr. Tobias Alexander are aware. Patient complaining of back pain 10/10 - patient received Fentanyl 10 mcg per Dr. Tobias Alexander verbal order. Also, patient received Albuterol breathing treatment per order. Will continue to monitor.

## 2020-12-09 NOTE — Anesthesia Preprocedure Evaluation (Addendum)
Anesthesia Evaluation  Patient identified by MRN, date of birth, ID band Patient awake    Reviewed: Allergy & Precautions, NPO status , Patient's Chart, lab work & pertinent test results  History of Anesthesia Complications Negative for: history of anesthetic complications  Airway Mallampati: II  TM Distance: >3 FB Neck ROM: Full    Dental no notable dental hx. (+) Dental Advisory Given,    Pulmonary sleep apnea (does not require CPAP) , COPD,  COPD inhaler, former smoker,  Mediastinal mass   Pulmonary exam normal        Cardiovascular hypertension, (-) angina(-) CAD Normal cardiovascular exam  1/19 ECHO: EF 60-65%, valves OK '12 Cath: No angiographic evidence of significant CAD, normal LVEF      Neuro/Psych  Headaches, Chronic back pain    GI/Hepatic Neg liver ROS, GERD  Medicated and Controlled,  Endo/Other  diabetes, Oral Hypoglycemic Agentsobese  Renal/GU negative Renal ROS     Musculoskeletal   Abdominal (+) + obese,   Peds  Hematology H/o lymphoma: chemo   Anesthesia Other Findings H/o basal cell and lymphoma: chemo  Reproductive/Obstetrics                            Anesthesia Physical  Anesthesia Plan  ASA: 3  Anesthesia Plan: General   Post-op Pain Management:    Induction: Intravenous  PONV Risk Score and Plan: 3 and Ondansetron, Dexamethasone and Midazolam  Airway Management Planned: Oral ETT  Additional Equipment:   Intra-op Plan:   Post-operative Plan: Extubation in OR  Informed Consent: I have reviewed the patients History and Physical, chart, labs and discussed the procedure including the risks, benefits and alternatives for the proposed anesthesia with the patient or authorized representative who has indicated his/her understanding and acceptance.     Dental advisory given  Plan Discussed with: CRNA and Anesthesiologist  Anesthesia Plan Comments:  (PAT note by Karoline Caldwell, PA-C:  Pertinent hx includes HTN, Asthma, COPD intolerant to CPAP, OSA, Lymphoma in remission, GERD, DMII (A1c 6.4 11/08/20). Follows with oncology, Dr. Benay Spice, for treatment of non-Hodgkin's lymphoma. He is s/p chemotherapy 2019 and remains in remission per most recent followup 09/01/20.  CMP and CBC from PCP visit 11/08/2020 reviewed, unremarkable.  Patient will need day of surgery evaluation.  TTE 02/08/2017: Study Conclusions  - Left ventricle: The cavity size was normal. There was mild concentric hypertrophy. Systolic function was normal. The estimated ejection fraction was in the range of 60% to 65%. Although no diagnostic regional wall motion abnormality was identified, this possibility cannot be completely excluded on the basis of this study. Doppler parameters are consistent with abnormal left ventricular relaxation (grade 1 diastolic dysfunction). - Ventricular septum: Septal motion showed "bounce". These changes are consistent with RV-LV interaction. - Aortic valve: Mildly calcified annulus. Trileaflet; normal thickness leaflets.  Complications: Echo was requested to be completed quickly for chemo and port access.  Cath 12/07/2010: Impression: 1. No angiographic evidence of significant CAD to explain Chest pain or stress test results. 2. Non-cardiac / Non-anginal chest pain 3. Normal LVEF  )       Anesthesia Quick Evaluation

## 2020-12-09 NOTE — H&P (Signed)
Subjective: The patient is a 65 year old Jeffery Wood male on whom I previously formed a lumbar fusion.  He did well until recently when he developed severe right leg pain.  He failed medical management.  He was worked up with a lumbar MRI which demonstrated a lumbar herniated disc.  I discussed the various treatment options with him.  He has decided proceed with surgery.  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    [facility])    basal cell of right temple; carcinoids   Complication of anesthesia    hard to wake up after bronchoscopy   COPD (chronic obstructive pulmonary disease) ([place])    Diabetes ([place])    type 2   Dyspnea    GERD (gastroesophageal reflux disease)    Gout    resolved, no current problem   Headache    History of hiatal hernia 05/16/2006   small noted on EGD   Hypertension    hx   Lung mass    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[place])    last chemo 05/2017, in remission   Memory loss    mild - r/t chemo, 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   Neuromuscular disorder ([place])    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   Sleep apnea    does not use anything for OSA    Past Surgical History:  Procedure Laterality Date   BACK SURGERY     L4-L5 bracket and screws   BICEPS TENDON REPAIR Left    CARPAL TUNNEL RELEASE     COLONOSCOPY     FINGER SURGERY     KNEE ARTHROSCOPY     ACL tear   LEFT HEART CATHETERIZATION WITH CORONARY ANGIOGRAM N/A 12/07/2010   Procedure: LEFT HEART CATHETERIZATION WITH CORONARY ANGIOGRAM;  Surgeon: Leonie Man, MD;  Location: [facility] CATH LAB;  Service: Cardiovascular;  Laterality: N/A;   PILONIDAL CYST EXCISION     PORT-A-CATH REMOVAL N/A 11/09/2017   Procedure: REMOVAL PORT-A-CATH;  Surgeon: [name], MD;  Location: [place];  Service: General;  Laterality: N/A;  local   PORTACATH PLACEMENT Left 02/27/2017   Procedure: INSERTION PORT-A-CATH;  Surgeon: [name], MD;  Location: [facility]S;  Service: General;  Laterality: Left;   UPPER GASTROINTESTINAL ENDOSCOPY  05/16/2006   VIDEO BRONCHOSCOPY WITH ENDOBRONCHIAL ULTRASOUND N/A 01/24/2017   Procedure: VIDEO BRONCHOSCOPY WITH ENDOBRONCHIAL ULTRASOUND with TRANSBRONCHIAL BIOPSY;  Surgeon: Grace Isaac, MD;  Location: [place];  Service: Thoracic;  Laterality: N/A;   VIDEO MEDIASTINOSCOPY N/A 02/05/2017   Procedure: VIDEO MEDIASTINOSCOPY;  Surgeon: Grace Isaac, MD;  Location: [place];  Service: Thoracic;  Laterality: N/A;    No Known Allergies  Social History   Tobacco Use   Smoking status: Former    Packs/day: 3.00    Years: 20.00    Pack years: 60.00    Types: Cigarettes    Quit date: 01/31/1988    Years since quitting: 32.8   Smokeless tobacco: Never  Substance Use Topics   Alcohol use: No    Family History  Problem Relation Age of Onset   Emphysema Mother    Asthma Mother    Heart disease Father    Prior to Admission medications   Medication Sig Start Date End Date Taking? Authorizing Provider  aspirin EC 81 MG tablet Take 81 mg by mouth daily.   Yes [provider]  celecoxib (CELEBREX) 200 MG capsule Take 1 capsule (200 mg total) by mouth daily with food 11/26/20  Yes   clonazePAM (KLONOPIN) 0.5 MG tablet TAKE 1 TABLET BY MOUTH EVERY NIGHT AT BEDTIME 10/06/20 04/04/21 Yes [name], MD  cyclobenzaprine (FLEXERIL) 10 MG tablet Take 1 tablet by mouth 3 times daily as needed for muscle spasms 11/19/20  Yes   DULoxetine (CYMBALTA) 30 MG capsule TAKE 1 CAPSULE (30 MG TOTAL) BY MOUTH AT BEDTIME. 08/09/20 08/09/21 Yes [name], MD  gabapentin (NEURONTIN) 300 MG capsule Take 3 capsules (900 mg total) by mouth 3 (three) times daily. 11/08/20 11/08/21 Yes [name], MD  gabapentin (NEURONTIN) 300 MG capsule Take 1 capsule (300 mg total) by mouth 3 (three) times daily. 12/06/20  Yes   HYDROcodone-acetaminophen (NORCO/VICODIN) 5-325 MG tablet take 1 tablet by mouth  every 6 hours as needed for pain 11/26/20  Yes   levalbuterol (XOPENEX HFA) 45 MCG/ACT inhaler Inhale 2 puffs into the lungs daily as needed for wheezing or shortness of breath. 03/26/19  Yes [name], MD  loratadine (CLARITIN) 10 MG tablet Take 10 mg by mouth daily.   Yes [provider]  metFORMIN (GLUCOPHAGE) 1000 MG tablet TAKE ONE (1) TABLET BY MOUTH TWO (2) TIMES DAILY 03/30/20 03/30/21 Yes [name], MD  omeprazole (PRILOSEC) 20 MG capsule TAKE 1 CAPSULE BY MOUTH 2 TIMES DAILY. 12/07/20 12/07/21 Yes [name], MD  oxyCODONE-acetaminophen (PERCOCET) 10-325 MG tablet Take 1/2 to 1 tablet by mouth every 4 hours as needed. 12/02/20  Yes   tamsulosin (FLOMAX) 0.4 MG CAPS capsule TAKE 1 CAPSULE (0.4 MG TOTAL) BY MOUTH DAILY. 08/09/20 08/09/21 Yes [name], MD  terbinafine (LAMISIL) 250 MG tablet Take 1 tablet (250 mg total) by mouth daily. 11/08/20  Yes [name], MD  tiotropium (SPIRIVA) 18 MCG inhalation capsule PLACE ONE CAPSULE IN THE INHALER AND INHALE ONCE DAILY 04/06/20 04/06/21 Yes [name], MD  traMADol (ULTRAM) 50 MG tablet Take 1 tablet by mouth every 8 hours as needed Patient taking differently: Take 50 mg by mouth at bedtime. 09/28/20  Yes   augmented betamethasone dipropionate (DIPROLENE-AF) 0.05 % cream APPLY AS DIRECTED TO AFFECTED AREAS ON THE SKIN Patient taking differently: Apply 1 application topically daily as needed (psoriasis). 02/17/20 02/16/21  [name], MD  oxyCODONE (OXY IR/ROXICODONE) 5 MG immediate release tablet Take 1 tablet (5 mg total) by mouth every 4 (four) hours as needed. Patient not taking: No sig reported 11/29/20   Inda Coke, PA  tiZANidine (ZANAFLEX) 4 MG tablet TAKE 1 TABLET BY MOUTH EVERY 6 (SIX) HOURS AS NEEDED FOR MUSCLE SPASMS. 09/07/20 09/07/21  [name], MD     Review of Systems  Positive ROS: As above  All other systems have been reviewed and were otherwise negative with the exception of those  mentioned in the HPI and as above.  Objective: Vital signs in last 24 hours: Temp:  [98 F (36.7 C)] 98 F (36.7 C) (11/10 0906) Pulse Rate:  [86] 86 (11/10 0906) Resp:  [18] 18 (11/10 0906) BP: (138)/(86) 138/86 (11/10 0906) SpO2:  [96 %] 96 % (11/10 0906) Weight:  [111.1 kg] 111.1 kg (11/10 0906) Estimated body mass index is 30.62 kg/m as calculated from the following:   Height as of this encounter: 6\' 3"  (1.905 m).   Weight as of this encounter: 111.1 kg.   General Appearance: Alert Head: Normocephalic, without obvious  abnormality, atraumatic Eyes: PERRL, conjunctiva/corneas clear, EOM's intact,    Ears: Normal  Throat: Normal  Neck: Supple, Back: His lumbar incision is well-healed. Lungs: Clear to auscultation bilaterally, respirations unlabored Heart: Regular rate and rhythm, no murmur, rub or gallop Abdomen: Soft, non-tender Extremities: Extremities normal, atraumatic, no cyanosis or edema Skin: unremarkable  NEUROLOGIC:   Mental status: alert and oriented,Motor Exam -he has some weakness in his right quadricep. Sensory Exam -he has numbness in the right L3 and L4 distribution reflexes:  Coordination - grossly normal Gait -he ambulates with a rolling walker. Balance - grossly normal Cranial Nerves: I: smell Not tested  II: visual acuity  OS: Normal  OD: Normal   II: visual fields Full to confrontation  II: pupils Equal, round, reactive to light  III,VII: ptosis None  III,IV,VI: extraocular muscles  Full ROM  V: mastication Normal  V: facial light touch sensation  Normal  V,VII: corneal reflex  Present  VII: facial muscle function - upper  Normal  VII: facial muscle function - lower Normal  VIII: hearing Not tested  IX: soft palate elevation  Normal  IX,X: gag reflex Present  XI: trapezius strength  5/5  XI: sternocleidomastoid strength 5/5  XI: neck flexion strength  5/5  XII: tongue strength  Normal    Data Review Lab Results  Component Value Date    WBC 5.7 11/08/2020   HGB 14.6 11/08/2020   HCT 43.6 11/08/2020   MCV 88.9 11/08/2020   PLT 268.0 11/08/2020   Lab Results  Component Value Date   NA 136 11/08/2020   K 4.9 11/08/2020   CL 99 11/08/2020   CO2 31 11/08/2020   BUN 13 11/08/2020   CREATININE 0.78 11/08/2020   GLUCOSE 124 (H) 11/08/2020   Lab Results  Component Value Date   INR 1.05 02/05/2017    Assessment/Plan: Right L2-3 and L3-4 herniated disc, lumbago, lumbar radiculopathy.  I have discussed the situation with the patient.  I reviewed his MRI scan with him and pointed out that abnormalities.  We have discussed the various treatment options including surgery.  I have described the surgical treatment option of a right L2-3 and L3-4 discectomy.  I have shown him surgical models.  I have given him a surgical pamphlet.  We have discussed the risk, benefits, alternatives, expected postop course, and likelihood of achieving our goals with surgery.  I have answered all his questions.  He has decided to proceed with surgery.   Ophelia Charter 12/09/2020 10:04 AM

## 2020-12-09 NOTE — Transfer of Care (Signed)
Immediate Anesthesia Transfer of Care Note  Patient: Jeffery Wood  Procedure(s) Performed: MICRODISCECTOMY, RIGHT LUMBAR TWO-THREE, LUMBAR THREE-FOUR (Right: Back)  Patient Location: PACU  Anesthesia Type:General  Level of Consciousness: awake, alert  and oriented  Airway & Oxygen Therapy: Patient Spontanous Breathing and Patient connected to face mask oxygen  Post-op Assessment: Report given to RN and Post -op Vital signs reviewed and stable  Post vital signs: Reviewed and stable  Last Vitals:  Vitals Value Taken Time  BP 178/96 12/09/20 1335  Temp    Pulse 77   Resp 8 12/09/20 1337  SpO2 100%   Vitals shown include unvalidated device data.  Last Pain:  Vitals:   12/09/20 0935  TempSrc:   PainSc: 9          Complications: No notable events documented.

## 2020-12-10 ENCOUNTER — Encounter (HOSPITAL_COMMUNITY): Payer: Self-pay | Admitting: Neurosurgery

## 2020-12-13 ENCOUNTER — Other Ambulatory Visit (HOSPITAL_COMMUNITY): Payer: Self-pay

## 2020-12-13 MED ORDER — METHYLPREDNISOLONE 4 MG PO TBPK
ORAL_TABLET | ORAL | 0 refills | Status: DC
  Filled 2020-12-13: qty 21, 6d supply, fill #0

## 2020-12-28 NOTE — Telephone Encounter (Signed)
Per Cover MyMeds Please advise the dispensing pharmacy to contact the [facility] at [phone] for assistance.

## 2021-01-04 ENCOUNTER — Other Ambulatory Visit (HOSPITAL_COMMUNITY): Payer: Self-pay

## 2021-01-04 ENCOUNTER — Encounter: Payer: Self-pay | Admitting: Oncology

## 2021-01-04 MED ORDER — TRAMADOL HCL 50 MG PO TABS
50.0000 mg | ORAL_TABLET | Freq: Three times a day (TID) | ORAL | 1 refills | Status: DC | PRN
Start: 2021-01-04 — End: 2021-04-05
  Filled 2021-01-04: qty 50, 16d supply, fill #0
  Filled 2021-02-28: qty 50, 16d supply, fill #1

## 2021-01-10 ENCOUNTER — Other Ambulatory Visit (HOSPITAL_COMMUNITY): Payer: Self-pay

## 2021-01-18 ENCOUNTER — Encounter: Payer: Self-pay | Admitting: Family Medicine

## 2021-01-18 ENCOUNTER — Other Ambulatory Visit: Payer: Self-pay | Admitting: Family Medicine

## 2021-01-18 ENCOUNTER — Encounter: Payer: Self-pay | Admitting: Oncology

## 2021-01-18 ENCOUNTER — Other Ambulatory Visit (HOSPITAL_COMMUNITY): Payer: Self-pay

## 2021-01-18 MED ORDER — CLONAZEPAM 0.5 MG PO TABS
ORAL_TABLET | Freq: Every day | ORAL | 0 refills | Status: DC
  Filled 2021-01-18: qty 90, 90d supply, fill #0

## 2021-01-19 ENCOUNTER — Other Ambulatory Visit (HOSPITAL_COMMUNITY): Payer: Self-pay

## 2021-01-19 ENCOUNTER — Encounter: Payer: Self-pay | Admitting: Physician Assistant

## 2021-01-19 ENCOUNTER — Telehealth (INDEPENDENT_AMBULATORY_CARE_PROVIDER_SITE_OTHER): Payer: Medicare PPO | Admitting: Physician Assistant

## 2021-01-19 VITALS — Ht 75.0 in | Wt 247.0 lb

## 2021-01-19 DIAGNOSIS — R051 Acute cough: Secondary | ICD-10-CM

## 2021-01-19 DIAGNOSIS — J4541 Moderate persistent asthma with (acute) exacerbation: Secondary | ICD-10-CM

## 2021-01-19 MED ORDER — BENZONATATE 100 MG PO CAPS
100.0000 mg | ORAL_CAPSULE | Freq: Two times a day (BID) | ORAL | 1 refills | Status: DC | PRN
  Filled 2021-01-19: qty 30, 15d supply, fill #0

## 2021-01-19 MED ORDER — AMOXICILLIN-POT CLAVULANATE 875-125 MG PO TABS
1.0000 | ORAL_TABLET | Freq: Two times a day (BID) | ORAL | 0 refills | Status: DC
  Filled 2021-01-19: qty 20, 10d supply, fill #0

## 2021-01-19 MED ORDER — PREDNISONE 20 MG PO TABS
40.0000 mg | ORAL_TABLET | Freq: Every day | ORAL | 0 refills | Status: DC
  Filled 2021-01-19: qty 10, 5d supply, fill #0

## 2021-01-19 NOTE — Progress Notes (Signed)
I acted as a Education administrator for [employer], PA-C Anselmo Pickler, LPN   Virtual Visit via Video Note   I, Inda Coke, connected with  Jeffery Wood  ([id number], [birth year]) on 01/19/21 at  9:30 AM EST by a video-enabled telemedicine application and verified that I am speaking with the correct person using two identifiers.  Location: Patient: Virtual Visit Location Patient: Home Provider: Virtual Visit Location Provider: Office/Clinic   I discussed the limitations of evaluation and management by telemedicine and the availability of in person appointments. The patient expressed understanding and agreed to proceed.    History of Present Illness: Jeffery Wood is a 65 y.o. who identifies as a male who was assigned male at birth, and is being seen today for cough.   Symptoms started several days ago. Pt c/o cough and expectorating green sputum, runny nose, chest congestion. Denies fever, chills, body aches. COVID test was negative approximately 2 days ago. He is using Dayquil, Nyquil and Mucinex. He has hx of asthma and is compliant with spiriva. He did have to use his xopenex inhaler this morning. Had some wheezing yesterday. Denies CP or SOB. PCP sent in benzonatate for him and he responded well, would like refill.  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[place]) 03/02/2017   NHL (non-Hodgkin's lymphoma) ([place]) 02/07/2017   OSA  (obstructive sleep apnea) 07/22/2013   Hyperglycemia 12/07/2010    Allergies: No Known Allergies Medications:  Current Outpatient Medications:    albuterol (PROVENTIL) (2.5 MG/3ML) 0.083% nebulizer solution, 3 ml as needed, Disp: , Rfl:    amoxicillin-clavulanate (AUGMENTIN) 875-125 MG tablet, Take 1 tablet by mouth 2 (two) times daily., Disp: 20 tablet, Rfl: 0   aspirin EC 81 MG tablet, Take 81 mg by mouth daily., Disp: , Rfl:    augmented betamethasone dipropionate (DIPROLENE-AF) 0.05 % cream, APPLY AS DIRECTED TO AFFECTED AREAS ON THE SKIN (Patient taking differently: Apply 1 application topically daily as needed (psoriasis).), Disp: 50 g, Rfl: 99   benzonatate (TESSALON) 100 MG capsule, Take 1 capsule (100 mg total) by mouth 2 (two) times daily as needed for cough., Disp: 30 capsule, Rfl: 1   celecoxib (CELEBREX) 200 MG capsule, Take 1 capsule (200 mg total) by mouth daily with food, Disp: 90 capsule, Rfl: 1   clonazePAM (KLONOPIN) 0.5 MG tablet, TAKE 1 TABLET BY MOUTH EVERY NIGHT AT BEDTIME, Disp: 90 tablet, Rfl: 0   cyclobenzaprine (FLEXERIL) 10 MG tablet, Take 1 tablet (10 mg total) by mouth 3 (three) times daily as needed for muscle spasms., Disp: 30 tablet, Rfl: 1   docusate sodium (COLACE) 100 MG capsule, Take 1 capsule (100 mg total) by mouth 2 (two) times daily., Disp: 10 capsule, Rfl: 0   DULoxetine (CYMBALTA) 30 MG capsule, TAKE 1 CAPSULE (30 MG TOTAL) BY MOUTH AT BEDTIME., Disp: 90 capsule, Rfl: 1   gabapentin (NEURONTIN) 300 MG capsule, Take 3 capsules (900 mg total) by mouth 3 (three) times  daily. (Patient taking differently: Take 1,200 mg by mouth QID.), Disp: 810 capsule, Rfl: 3   levalbuterol (XOPENEX HFA) 45 MCG/ACT inhaler, Inhale 2 puffs into the lungs daily as needed for wheezing or shortness of breath., Disp: 1 Inhaler, Rfl: 1   loratadine (CLARITIN) 10 MG tablet, Take 10 mg by mouth daily., Disp: , Rfl:    metFORMIN (GLUCOPHAGE) 1000 MG tablet, TAKE ONE (1) TABLET BY  MOUTH TWO (2) TIMES DAILY, Disp: 180 tablet, Rfl: 99   omeprazole (PRILOSEC) 20 MG capsule, TAKE 1 CAPSULE BY MOUTH 2 TIMES DAILY., Disp: 180 capsule, Rfl: 1   oxyCODONE-acetaminophen (PERCOCET) 10-325 MG tablet, Take 1 tablet by mouth every 4 (four) hours as needed for pain., Disp: 30 tablet, Rfl: 0   predniSONE (DELTASONE) 20 MG tablet, Take 2 tablets (40 mg total) by mouth daily., Disp: 10 tablet, Rfl: 0   tamsulosin (FLOMAX) 0.4 MG CAPS capsule, TAKE 1 CAPSULE (0.4 MG TOTAL) BY MOUTH DAILY., Disp: 90 capsule, Rfl: 1   terbinafine (LAMISIL) 250 MG tablet, Take 1 tablet (250 mg total) by mouth daily., Disp: 90 tablet, Rfl: 0   tiotropium (SPIRIVA) 18 MCG inhalation capsule, PLACE ONE CAPSULE IN THE INHALER AND INHALE ONCE DAILY, Disp: 30 capsule, Rfl: 99   traMADol (ULTRAM) 50 MG tablet, Take 1 tablet (50 mg total) by mouth every 8 (eight) hours as needed., Disp: 50 tablet, Rfl: 1  Observations/Objective: Patient is well-developed, well-nourished in no acute distress.  Resting comfortably  at home.  Head is normocephalic, atraumatic.  No labored breathing.  Speech is clear and coherent with logical content.  Patient is alert and oriented at baseline.    Assessment and Plan: Acute cough and Moderate persistent asthma with acute exacerbation No red flags on discussion.  Will initiate oral augmentin for sinusitis per orders. Recommended low threshold to start oral prednisone if requiring more frequent use of rescue inhaler or if has further wheezing. (He would like to avoid oral prednisone if at all possible, however I did send this in for him now) Tessalon perles refill also sent. Discussed taking medications as prescribed. Reviewed return precautions including worsening fever, SOB, worsening cough or other concerns. Push fluids and rest. I recommend that patient follow-up if symptoms worsen or persist despite treatment x 7-10 days, sooner if needed.  Follow Up Instructions: I discussed the  assessment and treatment plan with the patient. The patient was provided an opportunity to ask questions and all were answered. The patient agreed with the plan and demonstrated an understanding of the instructions.  A copy of instructions were sent to the patient via MyChart unless otherwise noted below.     The patient was advised to call back or seek an in-person evaluation if the symptoms worsen or if the condition fails to improve as anticipated.    Inda Coke, [place]

## 2021-01-26 ENCOUNTER — Encounter: Payer: Self-pay | Admitting: Physician Assistant

## 2021-01-26 ENCOUNTER — Telehealth: Payer: Self-pay | Admitting: Family Medicine

## 2021-01-26 NOTE — Telephone Encounter (Signed)
See other message

## 2021-01-26 NOTE — Telephone Encounter (Signed)
Please see message and advise 

## 2021-01-27 NOTE — Telephone Encounter (Signed)
Patient has been scheduled

## 2021-01-28 ENCOUNTER — Other Ambulatory Visit: Payer: Self-pay | Admitting: Family Medicine

## 2021-01-28 ENCOUNTER — Other Ambulatory Visit (HOSPITAL_COMMUNITY): Payer: Self-pay

## 2021-01-28 ENCOUNTER — Other Ambulatory Visit: Payer: Self-pay

## 2021-01-28 ENCOUNTER — Encounter: Payer: Self-pay | Admitting: Family Medicine

## 2021-01-28 ENCOUNTER — Ambulatory Visit: Payer: Medicare PPO | Admitting: Family Medicine

## 2021-01-28 VITALS — BP 109/75 | HR 102 | Temp 98.4°F | Ht 75.0 in | Wt 252.8 lb

## 2021-01-28 DIAGNOSIS — J4530 Mild persistent asthma, uncomplicated: Secondary | ICD-10-CM | POA: Diagnosis not present

## 2021-01-28 DIAGNOSIS — N400 Enlarged prostate without lower urinary tract symptoms: Secondary | ICD-10-CM | POA: Diagnosis not present

## 2021-01-28 DIAGNOSIS — G8929 Other chronic pain: Secondary | ICD-10-CM

## 2021-01-28 DIAGNOSIS — M545 Low back pain, unspecified: Secondary | ICD-10-CM

## 2021-01-28 LAB — URINALYSIS, ROUTINE W REFLEX MICROSCOPIC
Bilirubin Urine: NEGATIVE
Ketones, ur: NEGATIVE
Leukocytes,Ua: NEGATIVE
Nitrite: NEGATIVE
Specific Gravity, Urine: 1.025 (ref 1.000–1.030)
Total Protein, Urine: NEGATIVE
Urine Glucose: >=1000 — AB
Urobilinogen, UA: 0.2 (ref 0.0–1.0)
pH: 6 (ref 5.0–8.0)

## 2021-01-28 MED ORDER — TERBINAFINE HCL 250 MG PO TABS
250.0000 mg | ORAL_TABLET | Freq: Every day | ORAL | 0 refills | Status: DC
  Filled 2021-01-28: qty 90, 90d supply, fill #0

## 2021-01-28 MED ORDER — PREDNISONE 20 MG PO TABS
40.0000 mg | ORAL_TABLET | Freq: Every day | ORAL | 0 refills | Status: DC
Start: 2021-01-28 — End: 2021-06-08
  Filled 2021-01-28: qty 10, 5d supply, fill #0

## 2021-01-28 NOTE — Patient Instructions (Signed)
It was very nice to see you today!  I will refill the prednisone.  Please wait a couple of days before starting this.  Hopefully you will not need it.  We will check a urine sample.  We may start a medication to help with overactive bladder depending on the results.  I will refill the medication for your toenail fungus.  Let me know if your symptoms are not improving.     Take care, Dr Jerline Pain  PLEASE NOTE:  If you had any lab tests please let us know if you have not heard back within a few days. You may see your results on mychart before we have a chance to review them but we will give you a call once they are reviewed by [place]. If we ordered any referrals today, please let us know if you have not heard from their office within the next week.   Please try these tips to maintain a healthy lifestyle:  Eat at least 3 REAL meals and 1-2 snacks per day.  Aim for no more than 5 hours between eating.  If you eat breakfast, please do so within one hour of getting up.   Each meal should contain half fruits/vegetables, one quarter protein, and one quarter carbs (no bigger than a computer mouse)  Cut down on sweet beverages. This includes juice, soda, and sweet tea.   Drink at least 1 glass of water with each meal and aim for at least 8 glasses per day  Exercise at least 150 minutes every week.

## 2021-01-28 NOTE — Assessment & Plan Note (Signed)
Recently saw neurosurgery.  He had a microdiscectomy about 6 weeks ago.  Seems to be doing well postoperatively.

## 2021-01-28 NOTE — Assessment & Plan Note (Signed)
Is having some OAB symptoms.  We will check UA and urine culture on flexion.  May benefit from starting oxybutynin cultures negative.  He will continue current dose of Flomax.  Advised to follow-up with urology soon.

## 2021-01-28 NOTE — Progress Notes (Signed)
° °  Jeffery Wood is a 65 y.o. male who presents today for an office visit.  Assessment/Plan:  New/Acute Problems: Onychomycosis Has had some response to terbinafine.  Recent LFTs were normal.  We will refill today.  Chronic Problems Addressed Today: Chronic low back pain Recently saw neurosurgery.  He had a microdiscectomy about 6 weeks ago.  Seems to be doing well postoperatively.  BPH (benign prostatic hyperplasia) Is having some OAB symptoms.  We will check UA and urine culture on flexion.  May benefit from starting oxybutynin cultures negative.  He will continue current dose of Flomax.  Advised to follow-up with urology soon.  Asthma Mild flare seems to be resolving.  Slight wheezes today but otherwise reassuring exam.  We will send in log prescription for prednisone with instruction to not start unless symptoms do not continue to improve over the next few days.     Subjective:  HPI:  Patient here with cough.  Started over a week ago.  Was seen here by different provider.  Treated for asthma.  Symptoms were not improving until yesterday and today.  Still has some wheeze.  Still has some cough.  He has been ambulating on them.  He notices very.  He has also had issues with ongoing urinary urgency.  He has been taking Flomax for BPH.  This does not seem to be helping.  No dysuria.  No reported fevers or chills.  He also requests refill on terbinafine.  This seems to be working well for his onychomycosis.  No side effects.       Objective:  Physical Exam: BP 109/75 (BP Location: Left Arm)    Pulse (!) 102    Temp 98.4 F (36.9 C) (Temporal)    Ht 6\' 3"  (1.905 m)    Wt 252 lb 12.8 oz (114.7 kg)    SpO2 97%    BMI 31.60 kg/m   Gen: No acute distress, resting comfortably CV: Regular rate and rhythm with no murmurs appreciated Pulm: Normal work of breathing, clear to auscultation bilaterally with wheezes in bilateral bases MSK: Onychomycosis of bilateral toenails. Neuro: Grossly  normal, moves all extremities Psych: Normal affect and thought content      Jeffery Wood M. Jerline Pain, MD 01/28/2021 11:24 AM

## 2021-01-28 NOTE — Assessment & Plan Note (Signed)
Mild flare seems to be resolving.  Slight wheezes today but otherwise reassuring exam.  We will send in log prescription for prednisone with instruction to not start unless symptoms do not continue to improve over the next few days.

## 2021-01-28 NOTE — Addendum Note (Signed)
Addended by: Loralyn Freshwater L on: 01/28/2021 11:44 AM   Modules accepted: Orders

## 2021-01-29 LAB — URINE CULTURE
MICRO NUMBER:: 12813481
Result:: NO GROWTH
SPECIMEN QUALITY:: ADEQUATE

## 2021-01-30 DIAGNOSIS — I2510 Atherosclerotic heart disease of native coronary artery without angina pectoris: Secondary | ICD-10-CM

## 2021-01-30 HISTORY — DX: Atherosclerotic heart disease of native coronary artery without angina pectoris: I25.10

## 2021-02-02 NOTE — Progress Notes (Signed)
Please inform patient of the following:  His urine culture is negative but he has a lot of sugar and a little blood in his urine. Can we have him come back to recheck a UA and also get an A1c?  Jeffery Wood. Jerline Pain, MD 02/02/2021 7:45 AM

## 2021-02-04 ENCOUNTER — Other Ambulatory Visit: Payer: Self-pay

## 2021-02-04 DIAGNOSIS — R8290 Unspecified abnormal findings in urine: Secondary | ICD-10-CM

## 2021-02-07 ENCOUNTER — Other Ambulatory Visit: Payer: Self-pay | Admitting: *Deleted

## 2021-02-07 ENCOUNTER — Other Ambulatory Visit: Payer: Self-pay

## 2021-02-07 ENCOUNTER — Other Ambulatory Visit (INDEPENDENT_AMBULATORY_CARE_PROVIDER_SITE_OTHER): Payer: Medicare PPO

## 2021-02-07 DIAGNOSIS — R8290 Unspecified abnormal findings in urine: Secondary | ICD-10-CM

## 2021-02-07 DIAGNOSIS — R319 Hematuria, unspecified: Secondary | ICD-10-CM

## 2021-02-07 LAB — URINALYSIS, ROUTINE W REFLEX MICROSCOPIC
Bilirubin Urine: NEGATIVE
Ketones, ur: NEGATIVE
Leukocytes,Ua: NEGATIVE
Nitrite: NEGATIVE
RBC / HPF: NONE SEEN (ref 0–?)
Specific Gravity, Urine: 1.015 (ref 1.000–1.030)
Total Protein, Urine: NEGATIVE
Urine Glucose: NEGATIVE
Urobilinogen, UA: 0.2 (ref 0.0–1.0)
pH: 6 (ref 5.0–8.0)

## 2021-02-07 LAB — HEMOGLOBIN A1C: Hgb A1c MFr Bld: 7 % — ABNORMAL HIGH (ref 4.6–6.5)

## 2021-02-07 MED ORDER — GLIMEPIRIDE 4 MG PO TABS: 2.0000 mg | ORAL_TABLET | Freq: Every day | ORAL | Status: DC

## 2021-02-07 NOTE — Progress Notes (Signed)
Please inform patient of the following:  Is A1c is elevated to 7.0.  This is in the diabetic range and could explain some of his frequent urination.  We do not need to make any changes to his treatment plan at this time however we can add on an additional medication if he wishes.  Regardless he should continue to work on diet and exercise and we can recheck in 3 months.  He does have some blood in his urine.  I do not think this is anything urgent however recommend referral to urology for further evaluation.  Please place referral.

## 2021-02-08 ENCOUNTER — Other Ambulatory Visit: Payer: Self-pay | Admitting: Family Medicine

## 2021-02-08 ENCOUNTER — Other Ambulatory Visit (HOSPITAL_COMMUNITY): Payer: Self-pay

## 2021-02-08 MED ORDER — DULOXETINE HCL 30 MG PO CPEP
ORAL_CAPSULE | ORAL | 1 refills | Status: DC
  Filled 2021-02-08: qty 90, 90d supply, fill #0
  Filled 2021-05-18: qty 90, 90d supply, fill #1

## 2021-02-08 MED FILL — Tiotropium Bromide Monohydrate Inhal Cap 18 MCG (Base Equiv): RESPIRATORY_TRACT | 30 days supply | Qty: 30 | Fill #0 | Status: AC

## 2021-02-08 MED FILL — Betamethasone Dipropionate Augmented Cream 0.05%: CUTANEOUS | 30 days supply | Qty: 50 | Fill #0 | Status: AC

## 2021-02-11 ENCOUNTER — Other Ambulatory Visit (HOSPITAL_COMMUNITY): Payer: Self-pay

## 2021-02-14 ENCOUNTER — Other Ambulatory Visit (HOSPITAL_COMMUNITY): Payer: Self-pay

## 2021-02-25 ENCOUNTER — Telehealth: Payer: Self-pay | Admitting: Oncology

## 2021-02-28 ENCOUNTER — Other Ambulatory Visit (HOSPITAL_COMMUNITY): Payer: Self-pay

## 2021-02-28 ENCOUNTER — Other Ambulatory Visit: Payer: Self-pay | Admitting: Family Medicine

## 2021-03-01 ENCOUNTER — Other Ambulatory Visit (HOSPITAL_COMMUNITY): Payer: Self-pay

## 2021-03-01 MED ORDER — BETAMETHASONE DIPROPIONATE AUG 0.05 % EX CREA
TOPICAL_CREAM | CUTANEOUS | 99 refills | Status: DC
  Filled 2021-03-01: qty 50, 15d supply, fill #0
  Filled 2021-07-08: qty 50, 30d supply, fill #1
  Filled 2021-08-05: qty 50, 30d supply, fill #2

## 2021-03-01 MED ORDER — TAMSULOSIN HCL 0.4 MG PO CAPS
ORAL_CAPSULE | Freq: Every day | ORAL | 1 refills | Status: DC
  Filled 2021-03-01: qty 90, 90d supply, fill #0
  Filled 2021-05-30: qty 90, 90d supply, fill #1

## 2021-03-02 ENCOUNTER — Other Ambulatory Visit (HOSPITAL_COMMUNITY): Payer: Self-pay

## 2021-03-04 ENCOUNTER — Other Ambulatory Visit: Payer: Medicare PPO

## 2021-03-04 ENCOUNTER — Ambulatory Visit: Payer: BC Managed Care – PPO | Admitting: Oncology

## 2021-03-04 ENCOUNTER — Ambulatory Visit: Payer: Self-pay | Admitting: Nurse Practitioner

## 2021-03-04 ENCOUNTER — Other Ambulatory Visit: Payer: BC Managed Care – PPO

## 2021-03-08 ENCOUNTER — Other Ambulatory Visit (HOSPITAL_COMMUNITY): Payer: Self-pay

## 2021-03-09 ENCOUNTER — Other Ambulatory Visit (HOSPITAL_COMMUNITY): Payer: Self-pay

## 2021-03-09 DIAGNOSIS — R351 Nocturia: Secondary | ICD-10-CM | POA: Diagnosis not present

## 2021-03-09 DIAGNOSIS — R3121 Asymptomatic microscopic hematuria: Secondary | ICD-10-CM | POA: Diagnosis not present

## 2021-03-09 DIAGNOSIS — R3915 Urgency of urination: Secondary | ICD-10-CM | POA: Diagnosis not present

## 2021-03-09 DIAGNOSIS — R350 Frequency of micturition: Secondary | ICD-10-CM | POA: Diagnosis not present

## 2021-03-09 DIAGNOSIS — N401 Enlarged prostate with lower urinary tract symptoms: Secondary | ICD-10-CM | POA: Diagnosis not present

## 2021-03-11 ENCOUNTER — Encounter: Payer: Self-pay | Admitting: Oncology

## 2021-03-11 ENCOUNTER — Encounter: Payer: Self-pay | Admitting: *Deleted

## 2021-03-11 ENCOUNTER — Inpatient Hospital Stay: Payer: Medicare PPO | Attending: Oncology | Admitting: Oncology

## 2021-03-11 NOTE — Telephone Encounter (Signed)
left message to notify patient he didn't need lab work just to come on 2/10 to see

## 2021-03-11 NOTE — Progress Notes (Signed)
"  No show" for OV today. Scheduling message sent to reschedule for next month

## 2021-03-18 ENCOUNTER — Other Ambulatory Visit (HOSPITAL_COMMUNITY): Payer: Self-pay

## 2021-03-18 MED FILL — Metformin HCl Tab 1000 MG: ORAL | 90 days supply | Qty: 180 | Fill #1 | Status: AC

## 2021-03-29 DIAGNOSIS — E119 Type 2 diabetes mellitus without complications: Secondary | ICD-10-CM | POA: Diagnosis not present

## 2021-03-29 DIAGNOSIS — H524 Presbyopia: Secondary | ICD-10-CM | POA: Diagnosis not present

## 2021-03-29 DIAGNOSIS — H2513 Age-related nuclear cataract, bilateral: Secondary | ICD-10-CM | POA: Diagnosis not present

## 2021-03-29 DIAGNOSIS — H35033 Hypertensive retinopathy, bilateral: Secondary | ICD-10-CM | POA: Diagnosis not present

## 2021-03-29 DIAGNOSIS — H5201 Hypermetropia, right eye: Secondary | ICD-10-CM | POA: Diagnosis not present

## 2021-03-29 DIAGNOSIS — H52223 Regular astigmatism, bilateral: Secondary | ICD-10-CM | POA: Diagnosis not present

## 2021-03-29 DIAGNOSIS — H5212 Myopia, left eye: Secondary | ICD-10-CM | POA: Diagnosis not present

## 2021-03-29 LAB — HM DIABETES EYE EXAM

## 2021-04-04 ENCOUNTER — Ambulatory Visit: Payer: Medicare PPO | Admitting: Family Medicine

## 2021-04-04 ENCOUNTER — Encounter: Payer: Self-pay | Admitting: Family Medicine

## 2021-04-04 ENCOUNTER — Other Ambulatory Visit (HOSPITAL_COMMUNITY): Payer: Self-pay

## 2021-04-04 ENCOUNTER — Other Ambulatory Visit: Payer: Self-pay | Admitting: Family Medicine

## 2021-04-04 ENCOUNTER — Other Ambulatory Visit: Payer: Self-pay

## 2021-04-04 VITALS — BP 146/91 | HR 79 | Temp 98.1°F | Ht 75.0 in | Wt 261.0 lb

## 2021-04-04 DIAGNOSIS — R079 Chest pain, unspecified: Secondary | ICD-10-CM | POA: Diagnosis not present

## 2021-04-04 DIAGNOSIS — M792 Neuralgia and neuritis, unspecified: Secondary | ICD-10-CM

## 2021-04-04 DIAGNOSIS — Z23 Encounter for immunization: Secondary | ICD-10-CM

## 2021-04-04 DIAGNOSIS — R739 Hyperglycemia, unspecified: Secondary | ICD-10-CM | POA: Diagnosis not present

## 2021-04-04 DIAGNOSIS — M1990 Unspecified osteoarthritis, unspecified site: Secondary | ICD-10-CM

## 2021-04-04 DIAGNOSIS — R3129 Other microscopic hematuria: Secondary | ICD-10-CM

## 2021-04-04 MED ORDER — GLIMEPIRIDE 4 MG PO TABS: 2.0000 mg | ORAL_TABLET | Freq: Two times a day (BID) | ORAL | Status: DC

## 2021-04-04 MED ORDER — GLIMEPIRIDE 4 MG PO TABS
2.0000 mg | ORAL_TABLET | Freq: Two times a day (BID) | ORAL | 1 refills | Status: DC
  Filled 2021-04-04: qty 30, 30d supply, fill #0
  Filled 2021-05-18: qty 30, 30d supply, fill #1

## 2021-04-04 NOTE — Progress Notes (Signed)
? ?Jeffery Wood is a 66 y.o. male who presents today for an office visit. ? ?Assessment/Plan:  ?New/Acute Problems: ?Left Ankle Pain ?Concern for possible gout flare versus osteoarthritis flare.  He refuses further doses of prednisone at this point due to previous side effects.  He is already on Celebrex 200 mg daily and tramadol as needed.  Advised patient the only other real option in terms of oral medications at this point would be go to Celebrex 200 mg daily for the next 1 to 2 weeks.  He is agreeable to this.  He is on GI protection with omeprazole 20 mg twice daily.  He will be following up with his rheumatologist soon and may benefit from intra-articular steroid injection.  We discussed reasons to return to care. ? ?Chest Pain ?No current symptoms.  Reassuring EKG today.  Concern for possible ischemic etiology given exertional nature of his symptoms.  He is not a treadmill candidate and refuses stress testing.  We will order cardiac CT screening.  Depending on results may need referral to cardiology.  We discussed reasons to return to care and seek emergent care. ? ?Chronic Problems Addressed Today: ?Hyperglycemia ?Last A1c 7.0.  We will continue metformin 1000 mg twice daily and glimepiride 2 mg twice daily.  He will come back in a couple of months to recheck A1c.  We discussed lifestyle modifications. ? ?Osteoarthritis ?Could be contributing to his left ankle pain.  May benefit from intra-articular steroid injection.  He will follow-up with his rheumatologist soon.  Advised patient he can take Celebrex twice daily for the next couple of weeks -he is on GI protection with omeprazole. ? ?Neuropathic pain ?On gabapentin 1200 mg 3 times daily and Cymbalta 30 mg daily. ? ?Microscopic hematuria ?Has followed with urology.  No further work-up needed at this point per urology.  ? ?Prevnar 20 given today. ? ?  ?Subjective:  ?HPI: ? ?Patient is here for follow-up today.  He has multiple issues he would like to  discuss today. ? ?We saw him about 3 months ago.  At that time was having mild flare.  He had recently been on prednisone.  His asthma symptoms seem to have improved.  He is A1c was found to be elevated to 7.0 a couple of months ago.  He is on metformin 1000 mg twice daily.  He recently restarted glimepiride 2 mg twice daily.  He has not had any issues with this.  Does not check his sugar.  Does admit to eating quite a bit of sweets. ? ?Has also noticed sudden onset left ankle pain.  This started a couple of days ago.  No obvious injuries or precipitating events.  Difficult to walk.  Notes some swelling.  He has tried taking his pain medications including oxycodone with out significant improvement.  He does have a history of gout but has been several years since his last flareup. ? ?He has also had intermittent chest pain with exertion for the last several months.  Patient states that whenever he gets winded with activity he will get substernal chest pain that will subside with rest.  Does not have any symptoms at rest.  No nausea or vomiting.  No lightheadedness. ? ?   ?  ?Objective:  ?Physical Exam: ?BP (!) 146/91 (BP Location: Left Arm)   Pulse 79   Temp 98.1 ?F (36.7 ?C) (Temporal)   Ht '6\' 3"'$  (1.905 m)   Wt 261 lb (118.4 kg)   SpO2 98%  BMI 32.62 kg/m?   ?Gen: No acute distress, resting comfortably ?CV: Regular rate and rhythm with no murmurs appreciated ?Pulm: Normal work of breathing, clear to auscultation bilaterally with no crackles, wheezes, or rhonchi ?MSK: ?- Left ankle: Mild effusion noted.  Tenderness to palpation along anterior joint line.  Neurovascular intact distally. ?Neuro: Grossly normal, moves all extremities ?Psych: Normal affect and thought content ? ?EKG: NSR.  No ischemic changes. ? ?Time Spent: ?55 minutes of total time was spent on the date of the encounter performing the following actions: chart review prior to seeing the patient including recent visits with specialists, obtaining  history, performing a medically necessary exam, counseling on the treatment plan, placing orders, and documenting in our EHR.  ? ? ?   ? ?Algis Greenhouse. Jerline Pain, MD ?04/04/2021 9:00 AM  ?

## 2021-04-04 NOTE — Assessment & Plan Note (Signed)
On gabapentin 1200 mg 3 times daily and Cymbalta 30 mg daily. ?

## 2021-04-04 NOTE — Assessment & Plan Note (Signed)
Could be contributing to his left ankle pain.  May benefit from intra-articular steroid injection.  He will follow-up with his rheumatologist soon.  Advised patient he can take Celebrex twice daily for the next couple of weeks -he is on GI protection with omeprazole. ?

## 2021-04-04 NOTE — Assessment & Plan Note (Signed)
Has followed with urology.  No further work-up needed at this point per urology.  ?

## 2021-04-04 NOTE — Assessment & Plan Note (Signed)
Last A1c 7.0.  We will continue metformin 1000 mg twice daily and glimepiride 2 mg twice daily.  He will come back in a couple of months to recheck A1c.  We discussed lifestyle modifications. ?

## 2021-04-04 NOTE — Patient Instructions (Signed)
It was very nice to see you today! ? ?Your EKG today is normal.  We will get a CT scan to take a closer look at your heart and lungs. ? ?We will give your pneumonia shot today. ? ?You can go to Celebrex twice daily for 1 to 2 weeks.  Please follow-up with your rheumatologist soon ? ?You can continue your current blood sugar medications.  Please come back in 2 to 3 months to recheck your A1c. ? ?Take care, ?Dr Jerline Pain ? ?PLEASE NOTE: ? ?If you had any lab tests please let us know if you have not heard back within a few days. You may see your results on mychart before we have a chance to review them but we will give you a call once they are reviewed by [place]. If we ordered any referrals today, please let us know if you have not heard from their office within the next week.  ? ?Please try these tips to maintain a healthy lifestyle: ? ?Eat at least 3 REAL meals and 1-2 snacks per day.  Aim for no more than 5 hours between eating.  If you eat breakfast, please do so within one hour of getting up.  ? ?Each meal should contain half fruits/vegetables, one quarter protein, and one quarter carbs (no bigger than a computer mouse) ? ?Cut down on sweet beverages. This includes juice, soda, and sweet tea.  ? ?Drink at least 1 glass of water with each meal and aim for at least 8 glasses per day ? ?Exercise at least 150 minutes every week.   ?

## 2021-04-05 ENCOUNTER — Other Ambulatory Visit (HOSPITAL_COMMUNITY): Payer: Self-pay

## 2021-04-05 DIAGNOSIS — Z9889 Other specified postprocedural states: Secondary | ICD-10-CM | POA: Diagnosis not present

## 2021-04-05 DIAGNOSIS — M5116 Intervertebral disc disorders with radiculopathy, lumbar region: Secondary | ICD-10-CM | POA: Diagnosis not present

## 2021-04-05 MED ORDER — TRAMADOL HCL 50 MG PO TABS
ORAL_TABLET | ORAL | 1 refills | Status: DC
  Filled 2021-04-05: qty 50, 16d supply, fill #0
  Filled 2021-05-30: qty 50, 16d supply, fill #1

## 2021-04-06 ENCOUNTER — Ambulatory Visit: Payer: Medicare PPO | Admitting: Family Medicine

## 2021-04-06 ENCOUNTER — Other Ambulatory Visit (HOSPITAL_COMMUNITY): Payer: Self-pay

## 2021-04-06 MED FILL — Tiotropium Bromide Monohydrate Inhal Cap 18 MCG (Base Equiv): RESPIRATORY_TRACT | 30 days supply | Qty: 30 | Fill #1 | Status: AC

## 2021-04-08 ENCOUNTER — Other Ambulatory Visit: Payer: Self-pay

## 2021-04-08 ENCOUNTER — Inpatient Hospital Stay: Payer: Medicare PPO | Attending: Oncology | Admitting: Oncology

## 2021-04-08 VITALS — BP 152/90 | HR 95 | Temp 98.7°F | Resp 20 | Ht 75.0 in | Wt 258.0 lb

## 2021-04-08 DIAGNOSIS — E119 Type 2 diabetes mellitus without complications: Secondary | ICD-10-CM | POA: Diagnosis not present

## 2021-04-08 DIAGNOSIS — Z85828 Personal history of other malignant neoplasm of skin: Secondary | ICD-10-CM | POA: Insufficient documentation

## 2021-04-08 DIAGNOSIS — J45909 Unspecified asthma, uncomplicated: Secondary | ICD-10-CM | POA: Diagnosis not present

## 2021-04-08 DIAGNOSIS — M545 Low back pain, unspecified: Secondary | ICD-10-CM | POA: Diagnosis not present

## 2021-04-08 DIAGNOSIS — C8330 Diffuse large B-cell lymphoma, unspecified site: Secondary | ICD-10-CM

## 2021-04-08 DIAGNOSIS — Z79899 Other long term (current) drug therapy: Secondary | ICD-10-CM | POA: Insufficient documentation

## 2021-04-08 DIAGNOSIS — Z87891 Personal history of nicotine dependence: Secondary | ICD-10-CM | POA: Insufficient documentation

## 2021-04-08 DIAGNOSIS — K5903 Drug induced constipation: Secondary | ICD-10-CM | POA: Insufficient documentation

## 2021-04-08 DIAGNOSIS — J449 Chronic obstructive pulmonary disease, unspecified: Secondary | ICD-10-CM | POA: Insufficient documentation

## 2021-04-08 DIAGNOSIS — C8520 Mediastinal (thymic) large B-cell lymphoma, unspecified site: Secondary | ICD-10-CM | POA: Diagnosis not present

## 2021-04-08 DIAGNOSIS — C7951 Secondary malignant neoplasm of bone: Secondary | ICD-10-CM | POA: Diagnosis not present

## 2021-04-08 DIAGNOSIS — M109 Gout, unspecified: Secondary | ICD-10-CM | POA: Diagnosis not present

## 2021-04-08 DIAGNOSIS — L03039 Cellulitis of unspecified toe: Secondary | ICD-10-CM | POA: Diagnosis not present

## 2021-04-08 DIAGNOSIS — M79601 Pain in right arm: Secondary | ICD-10-CM | POA: Insufficient documentation

## 2021-04-08 NOTE — Progress Notes (Signed)
?Jeff[facility] ?OFFICE PROGRESS NOTE ? ? ?Diagnosis: Non-Hodgkin's and Phoma ? ?INTERVAL HISTORY:  ? ?[name] returns as scheduled.  He has mild soreness at the right submandibular nodule.  The nodule remains smaller.  He has chronic pain in the back and right arm.  He has intermittent discomfort at the left upper lateral anterior chest.  This is associated with exertion.  No associated symptoms.  He has been referred for a cardiac calcium CT. ? ?Objective: ? ?Vital signs in last 24 hours: ? ?Blood pressure (!) 152/90, pulse 95, temperature 98.7 ?F (37.1 ?C), temperature source Oral, resp. rate 20, height 6' 3"  (1.905 m), weight 258 lb (117 kg), SpO2 100 %. ?  ? ?HEENT: Oropharynx without visible mass, 1.5 cm mobile nodular lesion in the right submandibular area ?Lymphatics: No cervical, supraclavicular, axillary, or inguinal nodes ?Resp: Lungs clear bilaterally ?Cardio: Regular rate and rhythm ?GI: No hepatosplenomegaly ?Vascular: No leg edema ?a ? ?Lab Results: ? ?Lab Results  ?Component Value Date  ? WBC 5.7 11/08/2020  ? HGB 14.6 11/08/2020  ? HCT 43.6 11/08/2020  ? MCV 88.9 11/08/2020  ? PLT 268.0 11/08/2020  ? NEUTROABS 4.2 12/02/2018  ? ? ?CMP  ?Lab Results  ?Component Value Date  ? NA 136 11/08/2020  ? K 4.9 11/08/2020  ? CL 99 11/08/2020  ? CO2 31 11/08/2020  ? GLUCOSE 124 (H) 11/08/2020  ? BUN 13 11/08/2020  ? CREATININE 0.78 11/08/2020  ? CALCIUM 9.2 11/08/2020  ? PROT 6.2 11/08/2020  ? ALBUMIN 3.9 11/08/2020  ? AST 12 11/08/2020  ? ALT 9 11/08/2020  ? ALKPHOS 82 11/08/2020  ? BILITOT 0.7 11/08/2020  ? GFRNONAA >60 01/18/2018  ? GFRAA >60 01/18/2018  ? ? ?Medications: I have reviewed the patient's current medications. ? ? ?Assessment/Plan: ?High-grade B-cell non-Hodgkin's lymphoma, molecular studies pending, clinical stage IV, IPI- low intermediate risk, FISH panel revealed an 8q24/14q32 fusion (MYC/IgH), negative for BCL 6 and BCL-2 ?Chest x-ray 01/15/2017 - medial right upper  lobe/perihilar mass.   ?Chest CT 01/19/2017 - mass constricting the superior vena cava, subcarinal adenopathy, right suprahilar/mediastinal mass.   ?01/24/2017 status post bronchoscopy with transbronchial biopsy of level 7 node and biopsy of a right upper lobe lung mass by Dr. Servando Snare.  There was compression of the right mainstem bronchus.  A mass was noted in the right upper lobe.  Mediastinal lymph nodes were seen on EBUS.  Multiple biopsies of a level 7 node were obtained.  Brushings and a biopsy of the right upper lobe mass were obtained.  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  Final pathology was nondiagnostic.   ?PET scan on 02/02/2017 showed hypermetabolism corresponding to the right sided mediastinal mass, progressive since the CT 01/19/2017.  Metastatic disease/lymphoma within the bones, pericardium, stomach, bowel and abdominopelvic nodal stations.   ?Brain MRI 02/03/2017 showed no evidence of intracranial metastases or acute abnormality.   ?02/05/2017 status post mediastinoscopy.   ?Pathology on the mediastinal mass showed high-grade B-cell lymphoma.  Molecular studies are pending. ?Rituximab 02/08/2017 ?Cycle 1 CHOP 02/09/2017 ?Cycle 1 R-EPOCH 03/02/2017 ?Cycle 2 R-EPOCH 03/23/2017 ?Prophylactic intrathecal methotrexate 03/27/2017 ?Restaging PET scan 04/11/2017-significant decrease in FDG uptake associated with the chest, abdomen and pelvis and axial and proximal appendicular skeleton.  No new foci of abnormal radiotracer uptake identified. ?Cycle 3 R-EPOCH 04/13/2017 with intrathecal methotrexate prophylaxis ?Cycle 4 R-EPOCH 05/04/2017 with intrathecal methotrexate prophylaxis ?Cycle 5 R- EPOCH 05/25/2017 with intrathecal methotrexate prophylaxis ?  ?Chest/upper  back and right arm pain secondary to #1- resolved. ?Exertional dyspnea secondary to #1 and COPD.  Improved. 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 ? ? ? ? ?Disposition: ?[name] remains in clinical remission from non-Hodgkin's lymphoma.  The right submandibular nodular lesion is stable or slightly smaller.  This is likely benign enlargement of the right submandibular gland. ?He will return for an office visit in 6 months.  He will call in the interim as needed.  He will follow-up with Dr. Jerline Pain for evaluation of the chest discomfort. ? ?Betsy Coder, MD ? ?04/08/2021  ?9:17 AM ? ? ?

## 2021-04-08 NOTE — Progress Notes (Deleted)
?[place] ?OFFICE PROGRESS NOTE ? ? ?Diagnosis:  ? ?INTERVAL HISTORY:  ? ?*** ? ?Objective: ? ?Vital signs in last 24 hours: ? ?Blood pressure (!) 152/90, pulse 95, temperature 98.7 ?F (37.1 ?C), temperature source Oral, resp. rate 20, height 6' 3"  (1.905 m), weight 258 lb (117 kg), SpO2 100 %. ?  ? ?HEENT: *** ?Lymphatics: *** ?Resp: *** ?Cardio: *** ?GI: *** ?Vascular: *** ?Neuro:***  ?Skin:***  ? ?Portacath/PICC-without erythema ? ?Lab Results: ? ?Lab Results  ?Component Value Date  ? WBC 5.7 11/08/2020  ? HGB 14.6 11/08/2020  ? HCT 43.6 11/08/2020  ? MCV 88.9 11/08/2020  ? PLT 268.0 11/08/2020  ? NEUTROABS 4.2 12/02/2018  ? ? ?CMP  ?Lab Results  ?Component Value Date  ? NA 136 11/08/2020  ? K 4.9 11/08/2020  ? CL 99 11/08/2020  ? CO2 31 11/08/2020  ? GLUCOSE 124 (H) 11/08/2020  ? BUN 13 11/08/2020  ? CREATININE 0.78 11/08/2020  ? CALCIUM 9.2 11/08/2020  ? PROT 6.2 11/08/2020  ? ALBUMIN 3.9 11/08/2020  ? AST 12 11/08/2020  ? ALT 9 11/08/2020  ? ALKPHOS 82 11/08/2020  ? BILITOT 0.7 11/08/2020  ? GFRNONAA >60 01/18/2018  ? GFRAA >60 01/18/2018  ? ? ?No results found for: CEA1, CEA, [id number], CA125 ? ?Lab Results  ?Component Value Date  ? INR 1.05 02/05/2017  ? LABPROT 13.6 02/05/2017  ? ? ?Imaging: ? ?No results found. ? ?Medications: I have reviewed the patient's current medications. ? ? ?Assessment/Plan: ?High-grade B-cell non-Hodgkin's lymphoma, molecular studies pending, clinical stage IV, IPI- low intermediate risk, FISH panel revealed an 8q24/14q32 fusion (MYC/IgH), negative for BCL 6 and BCL-2 ?Chest x-ray 01/15/2017 - medial right upper lobe/perihilar mass.   ?Chest CT 01/19/2017 - mass constricting the superior vena cava, subcarinal adenopathy, right suprahilar/mediastinal mass.   ?01/24/2017 status post bronchoscopy with transbronchial biopsy of level 7 node and biopsy of a right upper lobe lung mass by Dr. Servando Snare.  There was compression of the right mainstem bronchus.  A mass was noted  in the right upper lobe.  Mediastinal lymph nodes were seen on EBUS.  Multiple biopsies of a level 7 node were obtained.  Brushings and a biopsy of the right upper lobe mass were obtained.  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  Final pathology was nondiagnostic.   ?PET scan on 02/02/2017 showed hypermetabolism corresponding to the right sided mediastinal mass, progressive since the CT 01/19/2017.  Metastatic disease/lymphoma within the bones, pericardium, stomach, bowel and abdominopelvic nodal stations.   ?Brain MRI 02/03/2017 showed no evidence of intracranial metastases or acute abnormality.   ?02/05/2017 status post mediastinoscopy.   ?Pathology on the mediastinal mass showed high-grade B-cell lymphoma.  Molecular studies are pending. ?Rituximab 02/08/2017 ?Cycle 1 CHOP 02/09/2017 ?Cycle 1 R-EPOCH 03/02/2017 ?Cycle 2 R-EPOCH 03/23/2017 ?Prophylactic intrathecal methotrexate 03/27/2017 ?Restaging PET scan 04/11/2017-significant decrease in FDG uptake associated with the chest, abdomen and pelvis and axial and proximal appendicular skeleton.  No new foci of abnormal radiotracer uptake identified. ?Cycle 3 R-EPOCH 04/13/2017 with intrathecal methotrexate prophylaxis ?Cycle 4 R-EPOCH 05/04/2017 with intrathecal methotrexate prophylaxis ?Cycle 5 R- EPOCH 05/25/2017 with intrathecal methotrexate prophylaxis ?  ?Chest/upper back and right arm pain secondary to #1- resolved. ?Exertional dyspnea secondary to #1 and COPD.  Improved. 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 ? ? ? ? ?Disposition: ?*** ? ?Betsy Coder, MD ? ?04/08/2021  ?8:58 AM ? ? ?

## 2021-04-18 DIAGNOSIS — Z6834 Body mass index (BMI) 34.0-34.9, adult: Secondary | ICD-10-CM | POA: Diagnosis not present

## 2021-04-18 DIAGNOSIS — M1991 Primary osteoarthritis, unspecified site: Secondary | ICD-10-CM | POA: Diagnosis not present

## 2021-04-18 DIAGNOSIS — L409 Psoriasis, unspecified: Secondary | ICD-10-CM | POA: Diagnosis not present

## 2021-04-18 DIAGNOSIS — M79644 Pain in right finger(s): Secondary | ICD-10-CM | POA: Diagnosis not present

## 2021-04-18 DIAGNOSIS — M5136 Other intervertebral disc degeneration, lumbar region: Secondary | ICD-10-CM | POA: Diagnosis not present

## 2021-04-18 DIAGNOSIS — M1A09X0 Idiopathic chronic gout, multiple sites, without tophus (tophi): Secondary | ICD-10-CM | POA: Diagnosis not present

## 2021-04-18 DIAGNOSIS — E669 Obesity, unspecified: Secondary | ICD-10-CM | POA: Diagnosis not present

## 2021-04-19 ENCOUNTER — Other Ambulatory Visit (HOSPITAL_COMMUNITY): Payer: Self-pay

## 2021-04-19 ENCOUNTER — Other Ambulatory Visit: Payer: Self-pay | Admitting: Family Medicine

## 2021-04-19 ENCOUNTER — Other Ambulatory Visit: Payer: Self-pay | Admitting: *Deleted

## 2021-04-19 MED ORDER — CLONAZEPAM 0.5 MG PO TABS: ORAL_TABLET | Freq: Every day | ORAL | 0 refills | Status: DC

## 2021-04-19 MED ORDER — CLONAZEPAM 0.5 MG PO TABS
ORAL_TABLET | Freq: Every day | ORAL | 0 refills | Status: DC
Start: 2021-04-19 — End: 2021-07-28
  Filled 2021-04-19: qty 30, 30d supply, fill #0
  Filled 2021-05-18: qty 30, 30d supply, fill #1
  Filled 2021-06-16: qty 30, 30d supply, fill #2

## 2021-05-04 ENCOUNTER — Other Ambulatory Visit: Payer: Self-pay | Admitting: Family Medicine

## 2021-05-06 ENCOUNTER — Other Ambulatory Visit (HOSPITAL_COMMUNITY): Payer: Self-pay

## 2021-05-10 ENCOUNTER — Other Ambulatory Visit: Payer: Self-pay | Admitting: Family Medicine

## 2021-05-11 ENCOUNTER — Other Ambulatory Visit (HOSPITAL_COMMUNITY): Payer: Self-pay

## 2021-05-11 ENCOUNTER — Other Ambulatory Visit: Payer: Self-pay | Admitting: *Deleted

## 2021-05-11 MED ORDER — TERBINAFINE HCL 250 MG PO TABS
250.0000 mg | ORAL_TABLET | Freq: Every day | ORAL | 0 refills | Status: DC
  Filled 2021-05-11: qty 90, 90d supply, fill #0

## 2021-05-11 MED ORDER — SPIRIVA HANDIHALER 18 MCG IN CAPS
ORAL_CAPSULE | RESPIRATORY_TRACT | 99 refills | Status: DC
  Filled 2021-05-11: qty 30, 30d supply, fill #0
  Filled 2021-07-08: qty 30, 30d supply, fill #1
  Filled 2021-08-05: qty 30, 30d supply, fill #2
  Filled 2021-11-20: qty 30, 30d supply, fill #3

## 2021-05-18 ENCOUNTER — Ambulatory Visit (INDEPENDENT_AMBULATORY_CARE_PROVIDER_SITE_OTHER)
Admission: RE | Admit: 2021-05-18 | Discharge: 2021-05-18 | Disposition: A | Discharge status: Home / Self Care | Payer: Self-pay | Source: Ambulatory Visit | Attending: Family Medicine | Admitting: Family Medicine

## 2021-05-18 DIAGNOSIS — R079 Chest pain, unspecified: Secondary | ICD-10-CM

## 2021-05-19 ENCOUNTER — Other Ambulatory Visit (HOSPITAL_COMMUNITY): Payer: Self-pay

## 2021-05-20 ENCOUNTER — Telehealth: Payer: Self-pay | Admitting: Family Medicine

## 2021-05-20 ENCOUNTER — Other Ambulatory Visit: Payer: Self-pay

## 2021-05-20 DIAGNOSIS — I38 Endocarditis, valve unspecified: Secondary | ICD-10-CM

## 2021-05-20 NOTE — Telephone Encounter (Signed)
Pt states he is returning a call he missed.  ? ?Please call back. ?

## 2021-05-20 NOTE — Progress Notes (Signed)
Please inform patient of the following: ? ?His CT scan showed calcification in his heart.  Recommend referral to cardiology for further evaluation. ? ?His CT scan also showed an area of scarring in his lungs.  This could explain some of his symptoms as well may be a result of his treated lymphoma.  Radiology recommended getting another chest CT scan with contrast to further evaluate.  Please place this order if he is agreeable. ? ?Algis Greenhouse. Jerline Pain, MD ?05/20/2021 12:31 PM  ?

## 2021-05-25 ENCOUNTER — Other Ambulatory Visit: Payer: Self-pay

## 2021-05-25 DIAGNOSIS — I38 Endocarditis, valve unspecified: Secondary | ICD-10-CM

## 2021-05-25 NOTE — Telephone Encounter (Signed)
See lab note.  

## 2021-05-30 ENCOUNTER — Other Ambulatory Visit (HOSPITAL_COMMUNITY): Payer: Self-pay

## 2021-05-30 ENCOUNTER — Other Ambulatory Visit: Payer: Self-pay | Admitting: Family Medicine

## 2021-05-30 MED ORDER — OMEPRAZOLE 20 MG PO CPDR
DELAYED_RELEASE_CAPSULE | Freq: Two times a day (BID) | ORAL | 1 refills | Status: DC
  Filled 2021-05-30: qty 180, 90d supply, fill #0

## 2021-06-01 ENCOUNTER — Other Ambulatory Visit (HOSPITAL_COMMUNITY): Payer: Self-pay

## 2021-06-07 ENCOUNTER — Other Ambulatory Visit: Payer: Self-pay | Admitting: Family Medicine

## 2021-06-07 ENCOUNTER — Encounter: Payer: Self-pay | Admitting: Internal Medicine

## 2021-06-07 ENCOUNTER — Ambulatory Visit: Payer: Medicare PPO | Admitting: Internal Medicine

## 2021-06-07 ENCOUNTER — Telehealth: Payer: Self-pay | Admitting: Family Medicine

## 2021-06-07 VITALS — BP 126/78 | HR 80 | Ht 75.0 in | Wt 259.0 lb

## 2021-06-07 DIAGNOSIS — Z01810 Encounter for preprocedural cardiovascular examination: Secondary | ICD-10-CM | POA: Diagnosis not present

## 2021-06-07 DIAGNOSIS — I10 Essential (primary) hypertension: Secondary | ICD-10-CM

## 2021-06-07 LAB — CBC
Hematocrit: 46.4 % (ref 37.5–51.0)
Hemoglobin: 15.9 g/dL (ref 13.0–17.7)
MCH: 29.7 pg (ref 26.6–33.0)
MCHC: 34.3 g/dL (ref 31.5–35.7)
MCV: 87 fL (ref 79–97)
Platelets: 250 10*3/uL (ref 150–450)
RBC: 5.35 x10E6/uL (ref 4.14–5.80)
RDW: 13.2 % (ref 11.6–15.4)
WBC: 6.4 10*3/uL (ref 3.4–10.8)

## 2021-06-07 LAB — BASIC METABOLIC PANEL
BUN/Creatinine Ratio: 18 (ref 10–24)
BUN: 14 mg/dL (ref 8–27)
CO2: 26 mmol/L (ref 20–29)
Calcium: 9.3 mg/dL (ref 8.6–10.2)
Chloride: 94 mmol/L — ABNORMAL LOW (ref 96–106)
Creatinine, Ser: 0.77 mg/dL (ref 0.76–1.27)
Glucose: 131 mg/dL — ABNORMAL HIGH (ref 70–99)
Potassium: 4.5 mmol/L (ref 3.5–5.2)
Sodium: 134 mmol/L (ref 134–144)
eGFR: 99 mL/min/{1.73_m2} (ref >59)

## 2021-06-07 LAB — TSH: TSH: 0.947 u[IU]/mL (ref 0.450–4.500)

## 2021-06-07 NOTE — Progress Notes (Signed)
? ?Cardiology Office Note ? ? ?Date:  06/07/2021  ? ?ID:  Jeffery Wood, DOB 1955/05/29, MRN [id number] ? ?PCP:  [name], MD  ?Cardiologist:   [name], MD  ? ?Patient presents for review of Ca score CT and evaluation of CP    Referred by Dr Jerline Pain   ? ?  ?History of Present Illness: ?Jeffery Wood is a 66 y.o. male with a history of DM, large cell lymphoma (in remission), COPD  The patient is followed by Dr Jerline Pain    He had a Ca score done in April that showed a Ca score of 294 (74%ile for age).    ?The pt says over the past few months he has had intermitt chest discomfort   Occurs with an without activity     With activity it is  worse  He has also noticed that he has slowed down because he "just gives out"    Winded   No energy   While riding lawn mower yesterday he says he  got very fatigued ? ?Denies PND      ? ?Pt thinks he had a heart cath in the past, years ago  Says it was at [facility]   Pt says they went in through wrist and had a problem at shoulder    ? ?Pt says he has been a diabetic for a long time  ?DM  Been on meds for a long time  ? ?Diet:   ?Breakfast:     Sausage with bagel     Diet mountain dew ?1 to 2 L per day Mtn Dew ?Lunch:   Skips ?Dinner:   Depends on what wife makes  ?Does have sweets     ? ? ? ?Current Meds  ?Medication Sig  ? albuterol (PROVENTIL) (2.5 MG/3ML) 0.083% nebulizer solution 3 ml as needed  ? aspirin EC 81 MG tablet Take 81 mg by mouth daily.  ? augmented betamethasone dipropionate (DIPROLENE-AF) 0.05 % cream APPLY AS DIRECTED TO AFFECTED AREAS ON THE SKIN  ? celecoxib (CELEBREX) 200 MG capsule Take 1 capsule (200 mg total) by mouth daily with food  ? clonazePAM (KLONOPIN) 0.5 MG tablet TAKE 1 TABLET BY MOUTH EVERY NIGHT AT BEDTIME  ? cyclobenzaprine (FLEXERIL) 10 MG tablet Take 1 tablet (10 mg total) by mouth 3 (three) times daily as needed for muscle spasms.  ? docusate sodium (COLACE) 100 MG capsule Take 1 capsule (100 mg total) by mouth 2 (two) times daily.  ?  DULoxetine (CYMBALTA) 30 MG capsule TAKE 1 CAPSULE (30 MG TOTAL) BY MOUTH AT BEDTIME.  ? gabapentin (NEURONTIN) 300 MG capsule Take 3 capsules (900 mg total) by mouth 3 (three) times daily. (Patient taking differently: Take 900 mg by mouth 4 (four) times daily.)  ? glimepiride (AMARYL) 4 MG tablet Take 1/2 tablets (2 mg total) by mouth 2 (two) times daily with a meal.  ? levalbuterol (XOPENEX HFA) 45 MCG/ACT inhaler Inhale 2 puffs into the lungs daily as needed for wheezing or shortness of breath.  ? loratadine (CLARITIN) 10 MG tablet Take 10 mg by mouth daily.  ? metFORMIN (GLUCOPHAGE) 1000 MG tablet TAKE ONE (1) TABLET BY MOUTH TWO (2) TIMES DAILY  ? omeprazole (PRILOSEC) 20 MG capsule TAKE 1 CAPSULE BY MOUTH 2 TIMES DAILY.  ? oxyCODONE-acetaminophen (PERCOCET) 10-325 MG tablet Take 1 tablet by mouth every 4 (four) hours as needed for pain.  ? tamsulosin (FLOMAX) 0.4 MG CAPS capsule TAKE 1 CAPSULE (0.4 MG  TOTAL) BY MOUTH DAILY.  ? tiotropium (SPIRIVA HANDIHALER) 18 MCG inhalation capsule PLACE ONE CAPSULE IN THE INHALER AND INHALE ONCE DAILY  ? traMADol (ULTRAM) 50 MG tablet Take 1 tablet (50 mg total) by mouth every 8 (eight) hours as needed.  ? ? ? ?Allergies:   Patient has no known allergies.  ? ?Past Medical History:  ?Diagnosis Date  ? Arthritis   ? Asthma   ? Basal cell carcinoma (BCC) of right temple region   ? Cancer [facility])   ? basal cell of right temple; carcinoids  ? Complication of anesthesia   ? hard to wake up after bronchoscopy  ? COPD (chronic obstructive pulmonary disease) ([place])   ? Diabetes ([place])   ? type 2  ? Dyspnea   ? GERD (gastroesophageal reflux disease)   ? Gout   ? resolved, no current problem  ? Headache   ? History of hiatal hernia 05/16/2006  ? small noted on EGD  ? Hypertension   ? hx  ? Lung mass   ? 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[place])   ? last chemo 05/2017, in remission  ? Memory loss   ? mild - r/t chemo, 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  ? Neuromuscular disorder ([place])   ? Rupture of  artery (HCC)   ? near ear  ? Sleep apnea   ? does not use anything for OSA  ? ? ?Past Surgical History:  ?Procedure Laterality Date  ? BACK SURGERY    ? L4-L5 bracket and screws  ? BICEPS TENDON REPAIR Left   ? CARPAL TUNNEL RELEASE    ? COLONOSCOPY    ? FINGER SURGERY    ? KNEE ARTHROSCOPY    ? ACL tear  ? LEFT HEART CATHETERIZATION WITH CORONARY ANGIOGRAM N/A 12/07/2010  ? Procedure: LEFT HEART CATHETERIZATION WITH CORONARY ANGIOGRAM;  Surgeon: Leonie Man, MD;  Location[facility] C[facility] LAB;  Service: Cardiovascular;  Laterality: N/A;  ? LUMBAR LAMINECTOMY/DECOMPRESSION MICRODISCECTOMY Right 12/09/2020  ? Procedure: MICRODISCECTOMY, RIGHT LUMBAR TWO-THREE, LUMBAR THREE-FOUR;  Surgeon: Newman Pies, MD;  Location: [place];  Service: Neurosurgery;  Laterality: Right;  ? PILONIDAL CYST EXCISION    ? PORT-A-CATH REMOVAL N/A 11/09/2017  ? Procedure: REMOVAL PORT-A-CATH;  Surgeon: [name], MD;  Location: [place];  Service: General;  Laterality: N/A;  local  ? PORTACATH PLACEMENT Left 02/27/2017  ? Procedure: INSERTION PORT-A-CATH;  Surgeon: [name], MD;  [facility];  Service: General;  Laterality: Left;  ? UPPER GASTROINTESTINAL ENDOSCOPY  05/16/2006  ? VIDEO BRONCHOSCOPY WITH ENDOBRONCHIAL ULTRASOUND N/A 01/24/2017  ? Procedure: VIDEO BRONCHOSCOPY WITH ENDOBRONCHIAL ULTRASOUND with TRANSBRONCHIAL BIOPSY;  Surgeon: Grace Isaac, MD;  Location: [place];  Service: Thoracic;  Laterality: N/A;  ? VIDEO MEDIASTINOSCOPY N/A 02/05/2017  ? Procedure: VIDEO MEDIASTINOSCOPY;  Surgeon: Grace Isaac, MD;  Location: [place];  Service: Thoracic;  Laterality: N/A;  ? ? ? ?Social History:  The patient  reports that he quit smoking about 33 years ago. His smoking use included cigarettes. He has a 60.00 pack-year smoking history. He has never used smokeless tobacco. He reports that he does not drink alcohol and does not use drugs.  ? ?Family History:  The patient's family history includes  Asthma in his mother; Emphysema in his mother; Heart disease in his father.  ? ? ?ROS:  Please see the history of present illness. All other systems are reviewed and  Negative to the above problem except as noted.  ? ? ?  PHYSICAL EXAM: ?VS:  BP 126/78   Pulse 80   Ht '6\' 3"'$  (1.905 m)   Wt 259 lb (117.5 kg)   SpO2 97%   BMI 32.37 kg/m?   ?GEN: Obese  in no acute distress  ?HEENT: normal  ?Neck: no JVD ?Cardiac: RRR; no murmurs,no  LE edema  ?Respiratory:  clear to auscultation bilaterally, ?GI: soft, nontender, nondistended ?MS: no deformity Moving all extremities   ?Skin: warm and dry,  ?Neuro:  Grossly intact   ?Psych: euthymic mood, full affect ? ? ?EKG:  EKG is not ordered today. On April 04, 2021   NSR   Possible IWMI   ? ? ?Lipid Panel ?   ?Component Value Date/Time  ? CHOL 184 11/08/2020 0840  ? TRIG 97.0 11/08/2020 0840  ? HDL 35.80 (L) 11/08/2020 0840  ? CHOLHDL 5 11/08/2020 0840  ? VLDL 19.4 11/08/2020 0840  ? [place] 129 (H) 11/08/2020 0840  ? LDLCALC 119 (H) 12/16/2019 0847  ? ?  ? ?Wt Readings from Last 3 Encounters:  ?06/07/21 259 lb (117.5 kg)  ?04/08/21 258 lb (117 kg)  ?04/04/21 261 lb (118.4 kg)  ?  ? ? ?ASSESSMENT AND PLAN: ? ?1  CAD  Pt with CAD noted on calclum score CT  Largest amount is Iin proximal portion of LAD.   Very worrisome that he is symptomatic  as he gives out easily, no energy.  Chest discomfort is worse with activity       ?I would recomm L heart cath to define anatomy    I cannot find a report from a cath in the past   ? If it was another kind of study   Anyway, will review with interventional  Consider femoral approach ? ? ?Keep on ASA   Take acitvity as tolerated.  Labs today    Hold metformin on day of procedure  ? ?2  Lipids    LDL 129  HDL 36  Trig 97 (Oct 2023)     Discussed diet    Pt says he "likes his bread"     Discussed statin   He is not too interested now    Told him about antiinflamm of statins   Will review again after procedure  ? ?3   DM   Discussed diet    (limit carbs)      ? ?4  Neuropathy     Pt says he is very unsteady, has fallen  Consider balance rehab consult ? ? ? ? ?Current medicines are reviewed at length with the patient today.  The patient does not have concerns

## 2021-06-07 NOTE — Telephone Encounter (Signed)
Pt states he is recently scheduled for a LEFT HEART CATH AND CORONARY ANGIOGRAPHY on 05/12. ? ?Pt asks, should he keep his 05/11 appointment with Dr. Jerline Pain to check his A1C? Or should he reschedule to a time after the procedure? ?

## 2021-06-07 NOTE — H&P (View-Only) (Signed)
? ?Cardiology Office Note ? ? ?Date:  06/07/2021  ? ?ID:  Jeffery Wood, DOB [birth year], MRN [id number] ? ?PCP:  [name], MD  ?Cardiologist:   [name], MD  ? ?Patient presents for review of Ca score CT and evaluation of CP    Referred by Dr Jerline Pain   ? ?  ?History of Present Illness: ?Jeffery Wood is a 66 y.o. male with a history of DM, large cell lymphoma (in remission), COPD  The patient is followed by Dr Jerline Pain    He had a Ca score done in April that showed a Ca score of 294 (74%ile for age).    ?The pt says over the past few months he has had intermitt chest discomfort   Occurs with an without activity     With activity it is  worse  He has also noticed that he has slowed down because he "just gives out"    Winded   No energy   While riding lawn mower yesterday he says he  got very fatigued ? ?Denies PND      ? ?Pt thinks he had a heart cath in the past, years ago  Says it was at [facility]   Pt says they went in through wrist and had a problem at shoulder    ? ?Pt says he has been a diabetic for a long time  ?DM  Been on meds for a long time  ? ?Diet:   ?Breakfast:     Sausage with bagel     Diet mountain dew ?1 to 2 L per day Mtn Dew ?Lunch:   Skips ?Dinner:   Depends on what wife makes  ?Does have sweets     ? ? ? ?Current Meds  ?Medication Sig  ? albuterol (PROVENTIL) (2.5 MG/3ML) 0.083% nebulizer solution 3 ml as needed  ? aspirin EC 81 MG tablet Take 81 mg by mouth daily.  ? augmented betamethasone dipropionate (DIPROLENE-AF) 0.05 % cream APPLY AS DIRECTED TO AFFECTED AREAS ON THE SKIN  ? celecoxib (CELEBREX) 200 MG capsule Take 1 capsule (200 mg total) by mouth daily with food  ? clonazePAM (KLONOPIN) 0.5 MG tablet TAKE 1 TABLET BY MOUTH EVERY NIGHT AT BEDTIME  ? cyclobenzaprine (FLEXERIL) 10 MG tablet Take 1 tablet (10 mg total) by mouth 3 (three) times daily as needed for muscle spasms.  ? docusate sodium (COLACE) 100 MG capsule Take 1 capsule (100 mg total) by mouth 2 (two) times daily.  ?  DULoxetine (CYMBALTA) 30 MG capsule TAKE 1 CAPSULE (30 MG TOTAL) BY MOUTH AT BEDTIME.  ? gabapentin (NEURONTIN) 300 MG capsule Take 3 capsules (900 mg total) by mouth 3 (three) times daily. (Patient taking differently: Take 900 mg by mouth 4 (four) times daily.)  ? glimepiride (AMARYL) 4 MG tablet Take 1/2 tablets (2 mg total) by mouth 2 (two) times daily with a meal.  ? levalbuterol (XOPENEX HFA) 45 MCG/ACT inhaler Inhale 2 puffs into the lungs daily as needed for wheezing or shortness of breath.  ? loratadine (CLARITIN) 10 MG tablet Take 10 mg by mouth daily.  ? metFORMIN (GLUCOPHAGE) 1000 MG tablet TAKE ONE (1) TABLET BY MOUTH TWO (2) TIMES DAILY  ? omeprazole (PRILOSEC) 20 MG capsule TAKE 1 CAPSULE BY MOUTH 2 TIMES DAILY.  ? oxyCODONE-acetaminophen (PERCOCET) 10-325 MG tablet Take 1 tablet by mouth every 4 (four) hours as needed for pain.  ? tamsulosin (FLOMAX) 0.4 MG CAPS capsule TAKE 1 CAPSULE (0.4 MG  TOTAL) BY MOUTH DAILY.  ? tiotropium (SPIRIVA HANDIHALER) 18 MCG inhalation capsule PLACE ONE CAPSULE IN THE INHALER AND INHALE ONCE DAILY  ? traMADol (ULTRAM) 50 MG tablet Take 1 tablet (50 mg total) by mouth every 8 (eight) hours as needed.  ? ? ? ?Allergies:   Patient has no known allergies.  ? ?Past Medical History:  ?Diagnosis Date  ? Arthritis   ? Asthma   ? Basal cell carcinoma (BCC) of right temple region   ? Cancer [facility])   ? basal cell of right temple; carcinoids  ? Complication of anesthesia   ? hard to wake up after bronchoscopy  ? COPD (chronic obstructive pulmonary disease) ([place])   ? Diabetes ([place])   ? type 2  ? Dyspnea   ? GERD (gastroesophageal reflux disease)   ? Gout   ? resolved, no current problem  ? Headache   ? History of hiatal hernia 05/16/2006  ? small noted on EGD  ? Hypertension   ? hx  ? Lung mass   ? 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[place])   ? last chemo 05/2017, in remission  ? Memory loss   ? mild - r/t chemo, 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  ? Neuromuscular disorder ([place])   ? Rupture of  artery (HCC)   ? near ear  ? Sleep apnea   ? does not use anything for OSA  ? ? ?Past Surgical History:  ?Procedure Laterality Date  ? BACK SURGERY    ? L4-L5 bracket and screws  ? BICEPS TENDON REPAIR Left   ? CARPAL TUNNEL RELEASE    ? COLONOSCOPY    ? FINGER SURGERY    ? KNEE ARTHROSCOPY    ? ACL tear  ? LEFT HEART CATHETERIZATION WITH CORONARY ANGIOGRAM N/A 12/07/2010  ? Procedure: LEFT HEART CATHETERIZATION WITH CORONARY ANGIOGRAM;  Surgeon: Leonie Man, MD;  Location: [facility] CATH LAB;  Service: Cardiovascular;  Laterality: N/A;  ? LUMBAR LAMINECTOMY/DECOMPRESSION MICRODISCECTOMY Right 12/09/2020  ? Procedure: MICRODISCECTOMY, RIGHT LUMBAR TWO-THREE, LUMBAR THREE-FOUR;  Surgeon: Newman Pies, MD;  Location: [place];  Service: Neurosurgery;  Laterality: Right;  ? PILONIDAL CYST EXCISION    ? PORT-A-CATH REMOVAL N/A 11/09/2017  ? Procedure: REMOVAL PORT-A-CATH;  Surgeon: [name], MD;  Location: [place];  Service: General;  Laterality: N/A;  local  ? PORTACATH PLACEMENT Left 02/27/2017  ? Procedure: INSERTION PORT-A-CATH;  Surgeon: [name], MD;  [facility];  Service: General;  Laterality: Left;  ? UPPER GASTROINTESTINAL ENDOSCOPY  05/16/2006  ? VIDEO BRONCHOSCOPY WITH ENDOBRONCHIAL ULTRASOUND N/A 01/24/2017  ? Procedure: VIDEO BRONCHOSCOPY WITH ENDOBRONCHIAL ULTRASOUND with TRANSBRONCHIAL BIOPSY;  Surgeon: Grace Isaac, MD;  Location: [place];  Service: Thoracic;  Laterality: N/A;  ? VIDEO MEDIASTINOSCOPY N/A 02/05/2017  ? Procedure: VIDEO MEDIASTINOSCOPY;  Surgeon: Grace Isaac, MD;  Location: [place];  Service: Thoracic;  Laterality: N/A;  ? ? ? ?Social History:  The patient  reports that he quit smoking about 33 years ago. His smoking use included cigarettes. He has a 60.00 pack-year smoking history. He has never used smokeless tobacco. He reports that he does not drink alcohol and does not use drugs.  ? ?Family History:  The patient's family history includes  Asthma in his mother; Emphysema in his mother; Heart disease in his father.  ? ? ?ROS:  Please see the history of present illness. All other systems are reviewed and  Negative to the above problem except as noted.  ? ? ?  PHYSICAL EXAM: ?VS:  BP 126/78   Pulse 80   Ht '6\' 3"'$  (1.905 m)   Wt 259 lb (117.5 kg)   SpO2 97%   BMI 32.37 kg/m?   ?GEN: Obese  in no acute distress  ?HEENT: normal  ?Neck: no JVD ?Cardiac: RRR; no murmurs,no  LE edema  ?Respiratory:  clear to auscultation bilaterally, ?GI: soft, nontender, nondistended ?MS: no deformity Moving all extremities   ?Skin: warm and dry,  ?Neuro:  Grossly intact   ?Psych: euthymic mood, full affect ? ? ?EKG:  EKG is not ordered today. On April 04, 2021   NSR   Possible IWMI   ? ? ?Lipid Panel ?   ?Component Value Date/Time  ? CHOL 184 11/08/2020 0840  ? TRIG 97.0 11/08/2020 0840  ? HDL 35.80 (L) 11/08/2020 0840  ? CHOLHDL 5 11/08/2020 0840  ? VLDL 19.4 11/08/2020 0840  ? [place] 129 (H) 11/08/2020 0840  ? LDLCALC 119 (H) 12/16/2019 0847  ? ?  ? ?Wt Readings from Last 3 Encounters:  ?06/07/21 259 lb (117.5 kg)  ?04/08/21 258 lb (117 kg)  ?04/04/21 261 lb (118.4 kg)  ?  ? ? ?ASSESSMENT AND PLAN: ? ?1  CAD  Pt with CAD noted on calclum score CT  Largest amount is Iin proximal portion of LAD.   Very worrisome that he is symptomatic  as he gives out easily, no energy.  Chest discomfort is worse with activity       ?I would recomm L heart cath to define anatomy    I cannot find a report from a cath in the past   ? If it was another kind of study   Anyway, will review with interventional  Consider femoral approach ? ? ?Keep on ASA   Take acitvity as tolerated.  Labs today    Hold metformin on day of procedure  ? ?2  Lipids    LDL 129  HDL 36  Trig 97 (Oct 2023)     Discussed diet    Pt says he "likes his bread"     Discussed statin   He is not too interested now    Told him about antiinflamm of statins   Will review again after procedure  ? ?3   DM   Discussed diet    (limit carbs)      ? ?4  Neuropathy     Pt says he is very unsteady, has fallen  Consider balance rehab consult ? ? ? ? ?Current medicines are reviewed at length with the patient today.  The patient does not have concerns

## 2021-06-07 NOTE — Telephone Encounter (Signed)
Called pt and advised he could keep his appointment for Thursday and pt confirmed appt with me ?

## 2021-06-07 NOTE — Patient Instructions (Signed)
Medication Instructions:  ?Your physician recommends that you continue on your current medications as directed. Please refer to the Current Medication list given to you today. ? ?*If you need a refill on your cardiac medications before your next appointment, please call your pharmacy* ? ? ?Lab Work: ?BMET, CBC, TSH  ?If you have labs (blood work) drawn today and your tests are completely normal, you will receive your results only by: ?MyChart Message (if you have MyChart) OR ?A paper copy in the mail ?If you have any lab test that is abnormal or we need to change your treatment, we will call you to review the results. ? ? ?Testing/Procedures: ? ? ?[place] [facility] OFFICE ?[place], [address] ?[place] [address] ?Dept: [phone] ?Loc: [phone] ? ?Delphin Funes [place]  06/07/2021 ? ?You are scheduled for a Cardiac Catheterization on Friday, May 12 with Dr. [name]. ? ?1. Please arrive at the Main Entrance A at [facility]: [place], [place] [address] at 5:30 AM (This time is two hours before your procedure to ensure your preparation). Free valet parking service is available.  ? ?Special note: Every effort is made to have your procedure done on time. Please understand that emergencies sometimes delay scheduled procedures. ? ?2. Diet: Do not eat solid foods after midnight.  You may have clear liquids until 5 AM upon the day of the procedure. ? ?3. Labs: You will need to have blood drawn on Tuesday, May 9 at [facility] at [facility]. [address], [place]  ?Open: 7:30am - 5pm    Phone: [phone]. You do not need to be fasting. ? ?4. Medication instructions in preparation for your procedure: ? ? Contrast Allergy: No ? ?Do not take Diabetes Med Glucophage (Metformin) on the day of the procedure and HOLD 48 HOURS AFTER THE PROCEDURE. ? ?On the morning of your procedure, take Aspirin and  any morning medicines NOT listed above.  You may use sips of water. ? ?5. Plan to go home the same day, you will only stay overnight if medically necessary. ?6. You MUST have a responsible adult to drive you home. ?7. An adult MUST be with you the first 24 hours after you arrive home. ?8. Bring a current list of your medications, and the last time and date medication taken. ?9. Bring ID and current insurance cards. ?10.Please wear clothes that are easy to get on and off and wear slip-on shoes. ? ?Thank you for allowing [place] to care for you! ?  -- [place] [facility] ? ? ? ? ?Follow-Up: ?At [facility], you and your health needs are our priority.  As part of our continuing mission to provide you with exceptional heart care, we have created designated Provider Care Teams.  These Care Teams include your primary Cardiologist (physician) and Advanced Practice Providers (APPs -  Physician Assistants and Nurse Practitioners) who all work together to provide you with the care you need, when you need it. ? ?We recommend signing up for the patient portal called "MyChart".  Sign up information is provided on this After Visit Summary.  MyChart is used to connect with patients for Virtual Visits (Telemedicine).  Patients are able to view lab/test results, encounter notes, upcoming appointments, etc.  Non-urgent messages can be sent to your provider as well.   ?To learn more about what you can do with MyChart, go to [email or web address].   ? ? ? ?  Important Information About Sugar ? ? ? ? ?  ? ? ?

## 2021-06-08 ENCOUNTER — Other Ambulatory Visit: Payer: Self-pay

## 2021-06-08 ENCOUNTER — Other Ambulatory Visit (HOSPITAL_COMMUNITY): Payer: Self-pay

## 2021-06-08 DIAGNOSIS — R739 Hyperglycemia, unspecified: Secondary | ICD-10-CM

## 2021-06-08 MED ORDER — METFORMIN HCL 1000 MG PO TABS
ORAL_TABLET | ORAL | 0 refills | Status: DC
  Filled 2021-06-08: qty 180, 90d supply, fill #0

## 2021-06-09 ENCOUNTER — Other Ambulatory Visit (HOSPITAL_COMMUNITY): Payer: Self-pay

## 2021-06-09 ENCOUNTER — Ambulatory Visit: Payer: Medicare PPO | Admitting: Family Medicine

## 2021-06-09 ENCOUNTER — Encounter: Payer: Self-pay | Admitting: Family Medicine

## 2021-06-09 ENCOUNTER — Telehealth: Payer: Self-pay | Admitting: *Deleted

## 2021-06-09 VITALS — BP 144/84 | HR 75 | Temp 97.9°F | Ht 75.0 in | Wt 259.8 lb

## 2021-06-09 DIAGNOSIS — E1159 Type 2 diabetes mellitus with other circulatory complications: Secondary | ICD-10-CM

## 2021-06-09 DIAGNOSIS — R739 Hyperglycemia, unspecified: Secondary | ICD-10-CM | POA: Diagnosis not present

## 2021-06-09 DIAGNOSIS — I10 Essential (primary) hypertension: Secondary | ICD-10-CM

## 2021-06-09 DIAGNOSIS — B351 Tinea unguium: Secondary | ICD-10-CM

## 2021-06-09 LAB — POCT GLYCOSYLATED HEMOGLOBIN (HGB A1C): Hemoglobin A1C: 5.3 % (ref 4.0–5.6)

## 2021-06-09 MED ORDER — TERBINAFINE HCL 250 MG PO TABS
250.0000 mg | ORAL_TABLET | Freq: Every day | ORAL | 0 refills | Status: DC
  Filled 2021-06-09: qty 90, 90d supply, fill #0

## 2021-06-09 MED ORDER — GLIMEPIRIDE 4 MG PO TABS
ORAL_TABLET | ORAL | 1 refills | Status: DC
  Filled 2021-06-09: qty 15, 30d supply, fill #0
  Filled 2021-07-08: qty 15, 30d supply, fill #1
  Filled 2021-08-05: qty 15, 30d supply, fill #2
  Filled 2021-09-23: qty 15, 30d supply, fill #3
  Filled 2021-10-26: qty 15, 30d supply, fill #4
  Filled 2021-11-20: qty 15, 30d supply, fill #5
  Filled 2021-12-18: qty 15, 30d supply, fill #6
  Filled 2022-01-19: qty 15, 30d supply, fill #7

## 2021-06-09 NOTE — Assessment & Plan Note (Signed)
A1c 5.3.  He is doing well with diet and exercise.  No significant hypoglycemic episodes.  We will continue metformin 1000 mg twice daily.  We will decrease his glimepiride to 2 mg once daily with the biggest meal of the day.  We can recheck A1c in 3 to 6 months. ?

## 2021-06-09 NOTE — Progress Notes (Signed)
? ?  Jeffery Wood is a 66 y.o. male who presents today for an office visit. ? ?Assessment/Plan:  ?Chronic Problems Addressed Today: ?Onychomycosis ?Had had a lot of improvement though is still having some residual disease. Will send in another round of terbinafine. He will let me know if not improving.  ? ?Hypertension associated with diabetes ([place]) ?At goal per [facility] 8 off medications. ? ?Hyperglycemia ?A1c 5.3.  He is doing well with diet and exercise.  No significant hypoglycemic episodes.  We will continue metformin 1000 mg twice daily.  We will decrease his glimepiride to 2 mg once daily with the biggest meal of the day.  We can recheck A1c in 3 to 6 months. ? ?  ?Subjective:  ?HPI: ? ?See A/p for status of chronic conditions.   ? ?   ?  ?Objective:  ?Physical Exam: ?BP (!) 144/84 (BP Location: Left Arm) Comment: manually  Pulse 75   Temp 97.9 ?F (36.6 ?C) (Temporal)   Ht '6\' 3"'$  (1.905 m)   Wt 259 lb 12.8 oz (117.8 kg)   SpO2 97%   BMI 32.47 kg/m?   ?Gen: No acute distress, resting comfortably ?CV: Regular rate and rhythm with no murmurs appreciated ?Pulm: Normal work of breathing, clear to auscultation bilaterally with no crackles, wheezes, or rhonchi ?MSK: Onychomycosis noted.  ?Neuro: Grossly normal, moves all extremities ?Psych: Normal affect and thought content ? ?   ? ?Algis Greenhouse. Jerline Pain, MD ?06/09/2021 8:35 AM  ?

## 2021-06-09 NOTE — Patient Instructions (Addendum)
It was very nice to see you today! ? ?Please decrease your amaryl to once daily with your biggest meal. ? ?I will send in another round of terbinafine for your toenail fungus. ? ?Please come back to see me in 3 to 6 months.  Please come back to see me sooner if needed. ? ?Take care, ?Dr Jerline Pain ? ?PLEASE NOTE: ? ?If you had any lab tests please let us know if you have not heard back within a few days. You may see your results on mychart before we have a chance to review them but we will give you a call once they are reviewed by [place]. If we ordered any referrals today, please let us know if you have not heard from their office within the next week.  ? ?Please try these tips to maintain a healthy lifestyle: ? ?Eat at least 3 REAL meals and 1-2 snacks per day.  Aim for no more than 5 hours between eating.  If you eat breakfast, please do so within one hour of getting up.  ? ?Each meal should contain half fruits/vegetables, one quarter protein, and one quarter carbs (no bigger than a computer mouse) ? ?Cut down on sweet beverages. This includes juice, soda, and sweet tea.  ? ?Drink at least 1 glass of water with each meal and aim for at least 8 glasses per day ? ?Exercise at least 150 minutes every week.   ?

## 2021-06-09 NOTE — Telephone Encounter (Signed)
Cardiac Catheterization scheduled at [facility] for: Friday Jun 10, 2021 7:30 AM ?Arrival time and place: [place] [facility] A at: 5:30 AM ? ? ?Nothing to eat after midnight prior to procedure, clear liquids until 5 AM day of procedure. ? ?Medication instructions: ?-Hold: ? Metformin-day of procedure and 48 hours post procedure ?Amaryl-AM of procedure ?-Except hold medications usual morning medications can be taken with sips of water including aspirin 81 mg. ? ?Confirmed patient has responsible adult to drive home post procedure and be with patient first 24 hours after arriving home. ? ?Patient reports no new symptoms concerning for COVID-19/no exposure to COVID-19 in the past 10 days. ? ?Reviewed procedure instructions with patient.  ? ?

## 2021-06-09 NOTE — Assessment & Plan Note (Signed)
Had had a lot of improvement though is still having some residual disease. Will send in another round of terbinafine. He will let me know if not improving.  ?

## 2021-06-09 NOTE — Assessment & Plan Note (Signed)
At goal per JNC 8 off medications. ?

## 2021-06-10 ENCOUNTER — Other Ambulatory Visit: Payer: Self-pay

## 2021-06-10 ENCOUNTER — Encounter (HOSPITAL_COMMUNITY)
Admission: RE | Disposition: A | Discharge status: Home / Self Care | Payer: Medicare PPO | Source: Home / Self Care | Attending: Interventional Cardiology

## 2021-06-10 ENCOUNTER — Encounter (HOSPITAL_COMMUNITY): Payer: Self-pay | Admitting: Interventional Cardiology

## 2021-06-10 ENCOUNTER — Ambulatory Visit (HOSPITAL_COMMUNITY)
Admission: RE | Admit: 2021-06-10 | Discharge: 2021-06-11 | Disposition: A | Discharge status: Home / Self Care | Payer: Medicare PPO | Attending: Interventional Cardiology | Admitting: Interventional Cardiology

## 2021-06-10 DIAGNOSIS — Z7984 Long term (current) use of oral hypoglycemic drugs: Secondary | ICD-10-CM | POA: Insufficient documentation

## 2021-06-10 DIAGNOSIS — Z87891 Personal history of nicotine dependence: Secondary | ICD-10-CM | POA: Diagnosis not present

## 2021-06-10 DIAGNOSIS — E1159 Type 2 diabetes mellitus with other circulatory complications: Secondary | ICD-10-CM | POA: Insufficient documentation

## 2021-06-10 DIAGNOSIS — E1140 Type 2 diabetes mellitus with diabetic neuropathy, unspecified: Secondary | ICD-10-CM | POA: Diagnosis not present

## 2021-06-10 DIAGNOSIS — Z955 Presence of coronary angioplasty implant and graft: Secondary | ICD-10-CM | POA: Insufficient documentation

## 2021-06-10 DIAGNOSIS — I2582 Chronic total occlusion of coronary artery: Secondary | ICD-10-CM | POA: Diagnosis not present

## 2021-06-10 DIAGNOSIS — J449 Chronic obstructive pulmonary disease, unspecified: Secondary | ICD-10-CM | POA: Diagnosis not present

## 2021-06-10 DIAGNOSIS — I25118 Atherosclerotic heart disease of native coronary artery with other forms of angina pectoris: Secondary | ICD-10-CM | POA: Diagnosis not present

## 2021-06-10 DIAGNOSIS — Z8572 Personal history of non-Hodgkin lymphomas: Secondary | ICD-10-CM | POA: Diagnosis not present

## 2021-06-10 DIAGNOSIS — I10 Essential (primary) hypertension: Secondary | ICD-10-CM | POA: Diagnosis not present

## 2021-06-10 DIAGNOSIS — G4733 Obstructive sleep apnea (adult) (pediatric): Secondary | ICD-10-CM | POA: Diagnosis present

## 2021-06-10 DIAGNOSIS — I2510 Atherosclerotic heart disease of native coronary artery without angina pectoris: Secondary | ICD-10-CM | POA: Diagnosis present

## 2021-06-10 DIAGNOSIS — I152 Hypertension secondary to endocrine disorders: Secondary | ICD-10-CM | POA: Diagnosis present

## 2021-06-10 HISTORY — PX: CORONARY BALLOON ANGIOPLASTY: CATH118233

## 2021-06-10 HISTORY — PX: CORONARY STENT INTERVENTION: CATH118234

## 2021-06-10 HISTORY — PX: LEFT HEART CATH AND CORONARY ANGIOGRAPHY: CATH118249

## 2021-06-10 HISTORY — PX: INTRAVASCULAR ULTRASOUND/IVUS: CATH118244

## 2021-06-10 LAB — POCT ACTIVATED CLOTTING TIME
Activated Clotting Time: 257 seconds
Activated Clotting Time: 353 seconds
Activated Clotting Time: 197 seconds
Activated Clotting Time: 221 seconds
Activated Clotting Time: 173 seconds

## 2021-06-10 LAB — GLUCOSE, CAPILLARY
Glucose-Capillary: 108 mg/dL — ABNORMAL HIGH (ref 70–99)
Glucose-Capillary: 146 mg/dL — ABNORMAL HIGH (ref 70–99)
Glucose-Capillary: 93 mg/dL (ref 70–99)
Glucose-Capillary: 172 mg/dL — ABNORMAL HIGH (ref 70–99)
Glucose-Capillary: 159 mg/dL — ABNORMAL HIGH (ref 70–99)

## 2021-06-10 SURGERY — LEFT HEART CATH AND CORONARY ANGIOGRAPHY
Anesthesia: LOCAL

## 2021-06-10 MED ORDER — OXYCODONE-ACETAMINOPHEN 10-325 MG PO TABS
1.0000 | ORAL_TABLET | ORAL | Status: DC | PRN
Start: 2021-06-10 — End: 2021-06-10

## 2021-06-10 MED ORDER — ASPIRIN 81 MG PO CHEW: 81.0000 mg | CHEWABLE_TABLET | ORAL | Status: DC

## 2021-06-10 MED ORDER — HEPARIN SODIUM (PORCINE) 1000 UNIT/ML IJ SOLN
INTRAMUSCULAR | Status: AC
  Filled 2021-06-10: qty 10

## 2021-06-10 MED ORDER — CELECOXIB 200 MG PO CAPS
200.0000 mg | ORAL_CAPSULE | Freq: Every day | ORAL | Status: DC
  Administered 2021-06-11: 200 mg via ORAL
  Filled 2021-06-10: qty 1

## 2021-06-10 MED ORDER — CLOPIDOGREL BISULFATE 300 MG PO TABS
ORAL_TABLET | ORAL | Status: AC
  Filled 2021-06-10: qty 2

## 2021-06-10 MED ORDER — ONDANSETRON HCL 4 MG/2ML IJ SOLN
4.0000 mg | Freq: Four times a day (QID) | INTRAMUSCULAR | Status: DC | PRN
Start: 2021-06-10 — End: 2021-06-11

## 2021-06-10 MED ORDER — DOCUSATE SODIUM 100 MG PO CAPS
100.0000 mg | ORAL_CAPSULE | Freq: Two times a day (BID) | ORAL | Status: DC
  Administered 2021-06-10 – 2021-06-11 (×2): 100 mg via ORAL
  Filled 2021-06-10 (×2): qty 1

## 2021-06-10 MED ORDER — MIDAZOLAM HCL 2 MG/2ML IJ SOLN
INTRAMUSCULAR | Status: AC
  Filled 2021-06-10: qty 2

## 2021-06-10 MED ORDER — DULOXETINE HCL 30 MG PO CPEP
30.0000 mg | ORAL_CAPSULE | Freq: Every day | ORAL | Status: DC
  Administered 2021-06-10 – 2021-06-11 (×2): 30 mg via ORAL
  Filled 2021-06-10 (×2): qty 1

## 2021-06-10 MED ORDER — CLOPIDOGREL BISULFATE 300 MG PO TABS
ORAL_TABLET | ORAL | Status: DC | PRN
  Administered 2021-06-10: 600 mg via ORAL

## 2021-06-10 MED ORDER — OXYCODONE HCL 5 MG PO TABS
ORAL_TABLET | ORAL | Status: AC
  Filled 2021-06-10: qty 1

## 2021-06-10 MED ORDER — HEPARIN (PORCINE) IN NACL 1000-0.9 UT/500ML-% IV SOLN
INTRAVENOUS | Status: AC
  Filled 2021-06-10: qty 1000

## 2021-06-10 MED ORDER — IOHEXOL 350 MG/ML SOLN
INTRAVENOUS | Status: DC | PRN
  Administered 2021-06-10: 180 mL

## 2021-06-10 MED ORDER — GABAPENTIN 300 MG PO CAPS
900.0000 mg | ORAL_CAPSULE | Freq: Three times a day (TID) | ORAL | Status: DC
  Administered 2021-06-10 – 2021-06-11 (×4): 900 mg via ORAL
  Filled 2021-06-10 (×4): qty 3

## 2021-06-10 MED ORDER — LORATADINE 10 MG PO TABS
10.0000 mg | ORAL_TABLET | Freq: Every day | ORAL | Status: DC
  Administered 2021-06-11: 10 mg via ORAL
  Filled 2021-06-10: qty 1

## 2021-06-10 MED ORDER — SODIUM CHLORIDE 0.9 % WEIGHT BASED INFUSION
3.0000 mL/kg/h | INTRAVENOUS | Status: DC
  Administered 2021-06-10: 3 mL/kg/h via INTRAVENOUS

## 2021-06-10 MED ORDER — ASPIRIN EC 81 MG PO TBEC: 81.0000 mg | DELAYED_RELEASE_TABLET | Freq: Every day | ORAL | Status: DC

## 2021-06-10 MED ORDER — VERAPAMIL HCL 2.5 MG/ML IV SOLN
INTRAVENOUS | Status: DC | PRN
  Administered 2021-06-10: 10 mL via INTRA_ARTERIAL

## 2021-06-10 MED ORDER — TAMSULOSIN HCL 0.4 MG PO CAPS
0.4000 mg | ORAL_CAPSULE | Freq: Every day | ORAL | Status: DC
  Administered 2021-06-11: 0.4 mg via ORAL
  Filled 2021-06-10: qty 1

## 2021-06-10 MED ORDER — HEPARIN (PORCINE) IN NACL 1000-0.9 UT/500ML-% IV SOLN
INTRAVENOUS | Status: DC | PRN
Start: 2021-06-10 — End: 2021-06-10
  Administered 2021-06-10 (×2): 500 mL

## 2021-06-10 MED ORDER — FENTANYL CITRATE (PF) 100 MCG/2ML IJ SOLN
INTRAMUSCULAR | Status: DC | PRN
  Administered 2021-06-10 (×4): 25 ug via INTRAVENOUS

## 2021-06-10 MED ORDER — LABETALOL HCL 5 MG/ML IV SOLN: 10.0000 mg | INTRAVENOUS | Status: DC | PRN

## 2021-06-10 MED ORDER — SODIUM CHLORIDE 0.9% FLUSH: 3.0000 mL | INTRAVENOUS | Status: DC | PRN

## 2021-06-10 MED ORDER — HYDRALAZINE HCL 20 MG/ML IJ SOLN: 10.0000 mg | INTRAMUSCULAR | Status: DC | PRN

## 2021-06-10 MED ORDER — SODIUM CHLORIDE 0.9 % IV SOLN: 250.0000 mL | INTRAVENOUS | Status: DC | PRN

## 2021-06-10 MED ORDER — HYDROMORPHONE HCL 1 MG/ML IJ SOLN
INTRAMUSCULAR | Status: DC | PRN
Start: 2021-06-10 — End: 2021-06-10
  Administered 2021-06-10 (×2): .5 mg via INTRAVENOUS

## 2021-06-10 MED ORDER — TIOTROPIUM BROMIDE MONOHYDRATE 18 MCG IN CAPS: 18.0000 ug | ORAL_CAPSULE | Freq: Every day | RESPIRATORY_TRACT | Status: DC

## 2021-06-10 MED ORDER — ACETAMINOPHEN 325 MG PO TABS: 650.0000 mg | ORAL_TABLET | ORAL | Status: DC | PRN

## 2021-06-10 MED ORDER — ASPIRIN 81 MG PO CHEW
81.0000 mg | CHEWABLE_TABLET | Freq: Every day | ORAL | Status: DC
  Administered 2021-06-11: 81 mg via ORAL
  Filled 2021-06-10: qty 1

## 2021-06-10 MED ORDER — CLONAZEPAM 0.5 MG PO TABS
0.5000 mg | ORAL_TABLET | Freq: Every day | ORAL | Status: DC
  Administered 2021-06-10: 0.5 mg via ORAL
  Filled 2021-06-10: qty 1

## 2021-06-10 MED ORDER — MIDAZOLAM HCL 2 MG/2ML IJ SOLN
INTRAMUSCULAR | Status: DC | PRN
  Administered 2021-06-10: 1 mg via INTRAVENOUS
  Administered 2021-06-10: 2 mg via INTRAVENOUS
  Administered 2021-06-10 (×2): 1 mg via INTRAVENOUS

## 2021-06-10 MED ORDER — SODIUM CHLORIDE 0.9% FLUSH
3.0000 mL | Freq: Two times a day (BID) | INTRAVENOUS | Status: DC
  Administered 2021-06-10 – 2021-06-11 (×2): 3 mL via INTRAVENOUS

## 2021-06-10 MED ORDER — CYCLOBENZAPRINE HCL 10 MG PO TABS: 10.0000 mg | ORAL_TABLET | Freq: Three times a day (TID) | ORAL | Status: DC | PRN

## 2021-06-10 MED ORDER — FENTANYL CITRATE (PF) 100 MCG/2ML IJ SOLN
INTRAMUSCULAR | Status: AC
  Filled 2021-06-10: qty 2

## 2021-06-10 MED ORDER — VERAPAMIL HCL 2.5 MG/ML IV SOLN
INTRAVENOUS | Status: DC | PRN
Start: 2021-06-10 — End: 2021-06-10
  Administered 2021-06-10: 2 mg via INTRA_ARTERIAL

## 2021-06-10 MED ORDER — CLOPIDOGREL BISULFATE 75 MG PO TABS
75.0000 mg | ORAL_TABLET | Freq: Every day | ORAL | Status: DC
  Administered 2021-06-11: 75 mg via ORAL
  Filled 2021-06-10: qty 1

## 2021-06-10 MED ORDER — SODIUM CHLORIDE 0.9% FLUSH: 3.0000 mL | Freq: Two times a day (BID) | INTRAVENOUS | Status: DC

## 2021-06-10 MED ORDER — TERBINAFINE HCL 250 MG PO TABS: 250.0000 mg | ORAL_TABLET | Freq: Every day | ORAL | Status: DC

## 2021-06-10 MED ORDER — NITROGLYCERIN 1 MG/10 ML FOR IR/CATH LAB
INTRA_ARTERIAL | Status: DC | PRN
Start: 2021-06-10 — End: 2021-06-10
  Administered 2021-06-10: 200 ug via INTRACORONARY

## 2021-06-10 MED ORDER — TRAMADOL HCL 50 MG PO TABS
50.0000 mg | ORAL_TABLET | Freq: Every day | ORAL | Status: DC
Start: 2021-06-10 — End: 2021-06-11
  Administered 2021-06-10: 50 mg via ORAL
  Filled 2021-06-10: qty 1

## 2021-06-10 MED ORDER — HYDROMORPHONE HCL 1 MG/ML IJ SOLN
INTRAMUSCULAR | Status: AC
  Filled 2021-06-10: qty 0.5

## 2021-06-10 MED ORDER — LIDOCAINE HCL (PF) 1 % IJ SOLN
INTRAMUSCULAR | Status: AC
  Filled 2021-06-10: qty 30

## 2021-06-10 MED ORDER — FAMOTIDINE IN NACL 20-0.9 MG/50ML-% IV SOLN
INTRAVENOUS | Status: AC
  Filled 2021-06-10: qty 50

## 2021-06-10 MED ORDER — OXYCODONE HCL 5 MG PO TABS
5.0000 mg | ORAL_TABLET | ORAL | Status: DC | PRN
  Administered 2021-06-10 – 2021-06-11 (×2): 5 mg via ORAL
  Filled 2021-06-10: qty 1

## 2021-06-10 MED ORDER — HEPARIN SODIUM (PORCINE) 1000 UNIT/ML IJ SOLN
INTRAMUSCULAR | Status: DC | PRN
  Administered 2021-06-10: 4000 [IU] via INTRAVENOUS
  Administered 2021-06-10: 13000 [IU] via INTRAVENOUS

## 2021-06-10 MED ORDER — LIDOCAINE HCL (PF) 1 % IJ SOLN
INTRAMUSCULAR | Status: DC | PRN
  Administered 2021-06-10: 20 mL
  Administered 2021-06-10: 2 mL

## 2021-06-10 MED ORDER — FAMOTIDINE IN NACL 20-0.9 MG/50ML-% IV SOLN
INTRAVENOUS | Status: DC | PRN
  Administered 2021-06-10: 20 mg via INTRAVENOUS

## 2021-06-10 MED ORDER — OXYCODONE-ACETAMINOPHEN 5-325 MG PO TABS
ORAL_TABLET | ORAL | Status: AC
  Filled 2021-06-10: qty 1

## 2021-06-10 MED ORDER — NITROGLYCERIN 1 MG/10 ML FOR IR/CATH LAB
INTRA_ARTERIAL | Status: AC
  Filled 2021-06-10: qty 10

## 2021-06-10 MED ORDER — GLIMEPIRIDE 4 MG PO TABS
4.0000 mg | ORAL_TABLET | Freq: Every day | ORAL | Status: DC
  Administered 2021-06-11: 4 mg via ORAL
  Filled 2021-06-10: qty 1

## 2021-06-10 MED ORDER — ALBUTEROL SULFATE (2.5 MG/3ML) 0.083% IN NEBU: 2.5000 mg | INHALATION_SOLUTION | RESPIRATORY_TRACT | Status: DC | PRN

## 2021-06-10 MED ORDER — LEVALBUTEROL TARTRATE 45 MCG/ACT IN AERO: 2.0000 | INHALATION_SPRAY | Freq: Every day | RESPIRATORY_TRACT | Status: DC | PRN

## 2021-06-10 MED ORDER — OXYCODONE-ACETAMINOPHEN 5-325 MG PO TABS
1.0000 | ORAL_TABLET | ORAL | Status: DC | PRN
  Administered 2021-06-10 – 2021-06-11 (×2): 1 via ORAL
  Filled 2021-06-10: qty 1

## 2021-06-10 MED ORDER — SODIUM CHLORIDE 0.9 % IV SOLN: INTRAVENOUS | Status: DC

## 2021-06-10 MED ORDER — VERAPAMIL HCL 2.5 MG/ML IV SOLN
INTRAVENOUS | Status: AC
  Filled 2021-06-10: qty 2

## 2021-06-10 MED ORDER — PANTOPRAZOLE SODIUM 40 MG PO TBEC
40.0000 mg | DELAYED_RELEASE_TABLET | Freq: Every day | ORAL | Status: DC
  Administered 2021-06-11: 40 mg via ORAL
  Filled 2021-06-10: qty 1

## 2021-06-10 MED ORDER — SODIUM CHLORIDE 0.9 % WEIGHT BASED INFUSION
1.0000 mL/kg/h | INTRAVENOUS | Status: DC
  Administered 2021-06-10: 250 mL via INTRAVENOUS

## 2021-06-10 SURGICAL SUPPLY — 37 items
BALL SAPPHIRE NC24 2.25X15 (BALLOONS) ×2
BALL SAPPHIRE NC24 3.75X12 (BALLOONS) ×2
BALLN MINITREK OTW 2.0X12 (BALLOONS) ×2
BALLN SAPPHIRE 2.0X10 (BALLOONS) ×2
BALLN SAPPHIRE 3.0X12 (BALLOONS) ×2
BALLOON MINITREK OTW 2.0X12 (BALLOONS) IMPLANT
BALLOON SAPPHIRE 2.0X10 (BALLOONS) IMPLANT
BALLOON SAPPHIRE 3.0X12 (BALLOONS) IMPLANT
BALLOON SAPPHIRE NC24 2.25X15 (BALLOONS) IMPLANT
BALLOON SAPPHIRE NC24 3.75X12 (BALLOONS) IMPLANT
CATH 5FR JL3.5 JR4 ANG PIG MP (CATHETERS) ×1 IMPLANT
CATH INFINITI 4FR 3 DRC (CATHETERS) ×1 IMPLANT
CATH INFINITI 4FR JL3.5 (CATHETERS) ×1 IMPLANT
CATH LAUNCHER 6FR EBU 3.75 (CATHETERS) ×1 IMPLANT
CATH OPTICROSS HD (CATHETERS) ×1 IMPLANT
DEVICE RAD COMP TR BAND LRG (VASCULAR PRODUCTS) ×1 IMPLANT
DEVICE RAD TR BAND REGULAR (VASCULAR PRODUCTS) ×1 IMPLANT
GLIDESHEATH SLEND SS 6F .021 (SHEATH) ×1 IMPLANT
GUIDEWIRE INQWIRE 1.5J.035X260 (WIRE) IMPLANT
INQWIRE 1.5J .035X260CM (WIRE) ×2
KIT ENCORE 26 ADVANTAGE (KITS) ×1 IMPLANT
KIT HEART LEFT (KITS) ×2 IMPLANT
KIT HEMO VALVE WATCHDOG (MISCELLANEOUS) ×1 IMPLANT
PACK CARDIAC CATHETERIZATION (CUSTOM PROCEDURE TRAY) ×2 IMPLANT
SHEATH PINNACLE 6F 10CM (SHEATH) ×1 IMPLANT
SHEATH PROBE COVER 6X72 (BAG) ×1 IMPLANT
SLED PULL BACK IVUS (MISCELLANEOUS) ×1 IMPLANT
STENT ONYX FRONTIER 2.0X30 (Permanent Stent) ×1 IMPLANT
STENT ONYX FRONTIER 3.5X15 (Permanent Stent) ×1 IMPLANT
TRANSDUCER W/STOPCOCK (MISCELLANEOUS) ×2 IMPLANT
TUBING CIL FLEX 10 FLL-RA (TUBING) ×2 IMPLANT
WIRE ASAHI PROWATER 180CM (WIRE) ×2 IMPLANT
WIRE ASAHI PROWATER 300CM (WIRE) ×1 IMPLANT
WIRE EMERALD 3MM-J .035X260CM (WIRE) ×1 IMPLANT
WIRE FIGHTER CROSSING 190CM (WIRE) ×1 IMPLANT
WIRE HI TORQ BMW 190CM (WIRE) ×1 IMPLANT
WIRE HI TORQ VERSACORE-J 145CM (WIRE) ×1 IMPLANT

## 2021-06-10 NOTE — Interval H&P Note (Signed)
Cath Lab Visit (complete for each Cath Lab visit) ? ?Clinical Evaluation Leading to the Procedure:  ? ?ACS: No. ? ?Non-ACS:   ? ?Anginal Classification: CCS III ? ?Anti-ischemic medical therapy: Minimal Therapy (1 class of medications) ? ?Non-Invasive Test Results: No non-invasive testing performed ? ?Prior CABG: No previous CABG ? ?Prior coronary calcium ? ? ? ?History and Physical Interval Note: ? ?06/10/2021 ?7:48 AM ? ?Jeffery Wood  has presented today for surgery, with the diagnosis of unstable angina.  The various methods of treatment have been discussed with the patient and family. After consideration of risks, benefits and other options for treatment, the patient has consented to  Procedure(s): ?LEFT HEART CATH AND CORONARY ANGIOGRAPHY (N/A) as a surgical intervention.  The patient's history has been reviewed, patient examined, no change in status, stable for surgery.  I have reviewed the patient's chart and labs.  Questions were answered to the patient's satisfaction.   ? ? ?[name] ? ? ?

## 2021-06-11 ENCOUNTER — Other Ambulatory Visit (HOSPITAL_COMMUNITY): Payer: Self-pay

## 2021-06-11 DIAGNOSIS — G4733 Obstructive sleep apnea (adult) (pediatric): Secondary | ICD-10-CM | POA: Diagnosis not present

## 2021-06-11 DIAGNOSIS — Z955 Presence of coronary angioplasty implant and graft: Secondary | ICD-10-CM | POA: Diagnosis not present

## 2021-06-11 DIAGNOSIS — I2582 Chronic total occlusion of coronary artery: Secondary | ICD-10-CM | POA: Diagnosis not present

## 2021-06-11 DIAGNOSIS — J449 Chronic obstructive pulmonary disease, unspecified: Secondary | ICD-10-CM | POA: Diagnosis not present

## 2021-06-11 DIAGNOSIS — I25118 Atherosclerotic heart disease of native coronary artery with other forms of angina pectoris: Secondary | ICD-10-CM | POA: Diagnosis not present

## 2021-06-11 DIAGNOSIS — E1159 Type 2 diabetes mellitus with other circulatory complications: Secondary | ICD-10-CM | POA: Diagnosis not present

## 2021-06-11 DIAGNOSIS — I10 Essential (primary) hypertension: Secondary | ICD-10-CM | POA: Diagnosis not present

## 2021-06-11 DIAGNOSIS — Z87891 Personal history of nicotine dependence: Secondary | ICD-10-CM | POA: Diagnosis not present

## 2021-06-11 DIAGNOSIS — E1140 Type 2 diabetes mellitus with diabetic neuropathy, unspecified: Secondary | ICD-10-CM | POA: Diagnosis not present

## 2021-06-11 LAB — CBC
HCT: 43.8 % (ref 39.0–52.0)
Hemoglobin: 14.3 g/dL (ref 13.0–17.0)
MCH: 29.5 pg (ref 26.0–34.0)
MCHC: 32.6 g/dL (ref 30.0–36.0)
MCV: 90.3 fL (ref 80.0–100.0)
Platelets: 226 10*3/uL (ref 150–400)
RBC: 4.85 MIL/uL (ref 4.22–5.81)
RDW: 13.8 % (ref 11.5–15.5)
WBC: 8.6 10*3/uL (ref 4.0–10.5)
nRBC: 0 % (ref 0.0–0.2)

## 2021-06-11 LAB — BASIC METABOLIC PANEL
Anion gap: 6 (ref 5–15)
BUN: 13 mg/dL (ref 8–23)
CO2: 29 mmol/L (ref 22–32)
Calcium: 8.6 mg/dL — ABNORMAL LOW (ref 8.9–10.3)
Chloride: 101 mmol/L (ref 98–111)
Creatinine, Ser: 0.88 mg/dL (ref 0.61–1.24)
GFR, Estimated: >60 mL/min (ref >60)
Glucose, Bld: 118 mg/dL — ABNORMAL HIGH (ref 70–99)
Potassium: 4.4 mmol/L (ref 3.5–5.1)
Sodium: 136 mmol/L (ref 135–145)

## 2021-06-11 LAB — GLUCOSE, CAPILLARY: Glucose-Capillary: 109 mg/dL — ABNORMAL HIGH (ref 70–99)

## 2021-06-11 MED ORDER — NITROGLYCERIN 0.4 MG SL SUBL
0.4000 mg | SUBLINGUAL_TABLET | SUBLINGUAL | 3 refills | Status: DC | PRN
  Filled 2021-06-11: qty 25, 10d supply, fill #0

## 2021-06-11 MED ORDER — PANTOPRAZOLE SODIUM 40 MG PO TBEC
40.0000 mg | DELAYED_RELEASE_TABLET | Freq: Every day | ORAL | 1 refills | Status: DC
Start: 2021-06-12 — End: 2021-12-01
  Filled 2021-06-11: qty 90, 90d supply, fill #0
  Filled 2021-08-28: qty 90, 90d supply, fill #1

## 2021-06-11 MED ORDER — CLOPIDOGREL BISULFATE 75 MG PO TABS
75.0000 mg | ORAL_TABLET | Freq: Every day | ORAL | 3 refills | Status: DC
Start: 2021-06-12 — End: 2022-03-27
  Filled 2021-06-11: qty 90, 90d supply, fill #0
  Filled 2021-08-28: qty 90, 90d supply, fill #1
  Filled 2021-12-01: qty 90, 90d supply, fill #2
  Filled 2022-03-02: qty 90, 90d supply, fill #3

## 2021-06-11 NOTE — Progress Notes (Signed)
CARDIAC REHAB PHASE I  ? ?PRE:  Rate/Rhythm: 83 SR ? ?  BP: sitting 133/69 ? ?  SaO2: 94 RA ? ?MODE:  Ambulation: 450 ft  ? ?POST:  Rate/Rhythm: 88 SR ? ?  BP: sitting 153/78  ? ?  SaO2: 97 RA ? ?3818-2993 ?Wife at bedside. CR discharge education completed in anticipation of discharge today. Stent card given. Reviewed exercise and activity progression, risk factors, angina and nitro, DAPT, diet and nutrition. Patient agreeable to phase 2 cardiac rehab referral to [place] [facility]. Post education patient ambulated in hallway independently with standby assist. C/O chest discomfort 2/10, same quality as prior to cath. Dr. Warren Lacy notified.   ? ?Willadean Carol RN, BSN ? ?

## 2021-06-11 NOTE — Discharge Summary (Addendum)
?Discharge Summary  ?  ?Patient ID: Jeffery Wood ?MRN: [id number]; DOB: 18-Oct-1955 ? ?Admit date: 06/10/2021 ?Discharge date: 06/11/2021 ? ?PCP:  [name], MD ?  ?[facility] Providers ?Cardiologist:  None      ? ? ?Discharge Diagnoses  ?  ?Principal Problem: ?  CAD (coronary artery disease) ?Active Problems: ?  OSA (obstructive sleep apnea) ?  Hypertension associated with diabetes ([place]) ? ? ? ?Diagnostic Studies/Procedures  ?  ?Cath 06/10/2021 ?  Dist LAD lesion is 95% stenosed.  A drug-eluting stent was successfully placed using a STENT ONYX FRONTIER 2.0X30, postdilated to 2.25 mm. ?  Post intervention, there is a 0% residual stenosis. ?  Mid LAD lesion is 80% stenosed.  A drug-eluting stent was successfully placed using a STENT ONYX FRONTIER 3.5X15, postdilated to 3.75 mm and optimized with intravascular ultrasound. ?  Post intervention, there is a 0% residual stenosis. ?  1st Diag lesion is 90% stenosed.  Balloon angioplasty was performed using a BALLN SAPPHIRE 2.0X10. ?  Post intervention, there is a 40% residual stenosis. ?  distal RPDA lesion is 100% stenosed.  Left to right collaterals. ?  The left ventricular systolic function is normal. ?  LV end diastolic pressure is normal. ?  The left ventricular ejection fraction is 55-65% by visual estimate. ?  There is no aortic valve stenosis. ?  ?High bifurcation of right radial from brachial artery.  Very small radial which will not accept a 6 French sheath.  4 French catheters did fit but intervention was done from the right femoral artery. ?  ?In the future, if cath is considered, could try right ulnar artery if radial remains patent. ?  ?Successful PCI of the mid LAD, distal LAD and first diagonal ostium.  Continue dual antiplatelet therapy for at least 6 months.  After 6 months, would consider clopidogrel monotherapy due to diffuse disease noted in the RCA. ?  ?Watch overnight.  Plan for discharge in a.m. ?  ? ?Diagnostic ?Dominance:  Right ?Intervention ?  ?  ?_____________ ?  ?History of Present Illness   ?  ?Jeffery Wood is a 66 y.o. male with DM, large cell lymphoma (in remission), COPD  The patient is followed by Dr Jerline Pain    He had a Ca score done in April that showed a Ca score of 294 (74%ile for age).    ?The pt says over the past few months he has had intermitt chest discomfort   Occurs with an without activity     With activity it is  worse  He has also noticed that he has slowed down because he "just gives out"    Winded   No energy   While riding lawn mower yesterday he says he  got very fatigued ?  ?Denies PND      ?  ?Pt thinks he had a heart cath in the past, years ago  Says it was at [facility]   Pt says they went in through wrist and had a problem at shoulder    ?  ?Pt says he has been a diabetic for a long time  ?DM  Been on meds for a long time  ? ?Hospital Course  ?   ?Consultants: N/A  ? ?Patient was seen by Dr. Harrington Challenger on 06/07/2021 at which time he complained of fatigue and the chest discomfort with activity.  Given elevated calcium score that was seen previously on CT scan, Dr. Harrington Challenger recommended proceed with cardiac catheterization.  Patient presented for cardiac catheterization on 06/10/2021, this revealed a 95% distal LAD lesion treated with Onyx frontier 2.0 x 30 mm DES postdilated to 2.25 mm, 80% mid LAD lesion treated with Onyx frontier 3.5 x 15 mm DES postdilated to 3.75 mm, 90% D1 lesion treated with balloon angioplasty reducing the blockage down to 40%, distal RPDA occlusion with left to right collaterals, EF 55 to 65% by LV gram.  Postprocedure, patient was placed on aspirin and Plavix with recommendation to continue to do antiplatelet therapy for at least 6 months, after 37-month can consider Plavix monotherapy due to diffuse disease noted in the RCA. ? ?Patient was seen in the morning of 06/11/2021 by Dr. HPercival Spanish he did have mild chest discomfort with ambulation, however overall he felt well after stent placement.  Blood  pressure was a little up prior to discharge, however Dr. HPercival Spanishwas not convinced the patient would take ARB or any other medication for his blood pressure.  Emphasis has been placed on compliance with dual antiplatelet therapy.  Patient agreed to start on statin therapy, Crestor 20 mg daily was added.  Recommend repeat fasting lipid panel and LFT in 10 weeks.  Home Prilosec has been switched to Protonix to avoid interaction with Plavix therapy.  [facility]  Staff message sent to our scheduler to arrange outpatient follow-up in 3 to 4 weeks.  Patient has been informed to hold off on taking metformin for 48 hours after cardiac catheterization. ? ? ?Did the patient have an acute coronary syndrome (MI, NSTEMI, STEMI, etc) this admission?:  No                               ?Did the patient have a percutaneous coronary intervention (stent / angioplasty)?:  Yes.   ? ? ?Cath/PCI Registry Performance & Quality Measures: ?Aspirin prescribed? - Yes ?ADP Receptor Inhibitor (Plavix/Clopidogrel, Brilinta/Ticagrelor or Effient/Prasugrel) prescribed (includes medically managed patients)? - Yes ?High Intensity Statin (Lipitor 40-'80mg'$  or Crestor 20-'40mg'$ ) prescribed? - Yes ?For EF <40%, was ACEI/ARB prescribed? - Not Applicable (EF >/= 462% ?For EF <40%, Aldosterone Antagonist (Spironolactone or Eplerenone) prescribed? - Not Applicable (EF >/= 494% ?Cardiac Rehab Phase II ordered? - Yes  ? ?   ?  ?_____________ ? ?Discharge Vitals ?Blood pressure (!) 144/96, pulse 78, temperature 98.2 ?F (36.8 ?C), temperature source Oral, resp. rate 11, height '6\' 3"'$  (1.905 m), weight 115.7 kg, SpO2 98 %.  ?Filed Weights  ? 06/10/21 0600  ?Weight: 115.7 kg  ? ? ?Labs & Radiologic Studies  ?  ?CBC ?Recent Labs  ?  06/11/21 ?0335  ?WBC 8.6  ?HGB 14.3  ?HCT 43.8  ?MCV 90.3  ?PLT 226  ? ?Basic Metabolic Panel ?Recent Labs  ?  06/11/21 ?0335  ?NA 136  ?K 4.4  ?CL 101   ?CO2 29  ?GLUCOSE 118*  ?BUN 13  ?CREATININE 0.88  ?CALCIUM 8.6*  ? ?Liver Function Tests ?No results for input(s): AST, ALT, ALKPHOS, BILITOT, PROT, ALBUMIN in the last 72 hours. ?No results for input(s): LIPASE, AMYLASE in the last 72 hours. ?High Sensitivity Troponin:   ?No results for input(s): TROPONINIHS in the last 720 hours.  ?BNP ?Invalid input(s): POCBNP ?D-Dimer ?No results for input(s): DDIMER in the last 72 hours. ?Hemoglobin A1C ?Recent Labs  ?  06/09/21 ?0804  ?HGBA1C 5.3  ? ?Fasting Lipid Panel ?No results for  input(s): CHOL, HDL, LDLCALC, TRIG, CHOLHDL, LDLDIRECT in the last 72 hours. ?Thyroid Function Tests ?No results for input(s): TSH, T4TOTAL, T3FREE, THYROIDAB in the last 72 hours. ? ?Invalid input(s): FREET3 ?_____________  ?CARDIAC CATHETERIZATION ? ?Addendum Date: 06/10/2021   ?  Dist LAD lesion is 95% stenosed.  A drug-eluting stent was successfully placed using a STENT ONYX FRONTIER 2.0X30, postdilated to 2.25 mm.   Post intervention, there is a 0% residual stenosis.   Mid LAD lesion is 80% stenosed.  A drug-eluting stent was successfully placed using a STENT ONYX FRONTIER 3.5X15, postdilated to 3.75 mm and optimized with intravascular ultrasound.   Post intervention, there is a 0% residual stenosis.   1st Diag lesion is 90% stenosed.  Balloon angioplasty was performed using a BALLN SAPPHIRE 2.0X10.   Post intervention, there is a 40% residual stenosis.   distal RPDA lesion is 100% stenosed.  Left to right collaterals.   The left ventricular systolic function is normal.   LV end diastolic pressure is normal.   The left ventricular ejection fraction is 55-65% by visual estimate.   There is no aortic valve stenosis. High bifurcation of right radial from brachial artery.  Very small radial which will not accept a 6 French sheath.  4 French catheters did fit but intervention was done from the right femoral artery. In the future, if cath is considered, could try right ulnar artery if radial  remains patent. Successful PCI of the mid LAD, distal LAD and first diagonal ostium.  Continue dual antiplatelet therapy for at least 6 months.  After 6 months, would consider clopidogrel monotherapy due to diffuse disease

## 2021-06-11 NOTE — Progress Notes (Signed)
? ?Progress Note ? ?Patient Name: Jeffery Wood ?Date of Encounter: 06/11/2021 ? ?Primary Cardiologist:   None ? ? ?Subjective  ? ?Mild chest pain with ambulation but feels better overall.  Anxious to go home.   ? ?Inpatient Medications  ?  ?Scheduled Meds: ? aspirin  81 mg Oral Daily  ? celecoxib  200 mg Oral Daily  ? clonazePAM  0.5 mg Oral QHS  ? clopidogrel  75 mg Oral Q breakfast  ? docusate sodium  100 mg Oral BID  ? DULoxetine  30 mg Oral Daily  ? gabapentin  900 mg Oral TID  ? glimepiride  4 mg Oral Q breakfast  ? loratadine  10 mg Oral Daily  ? pantoprazole  40 mg Oral Daily  ? sodium chloride flush  3 mL Intravenous Q12H  ? tamsulosin  0.4 mg Oral Daily  ? traMADol  50 mg Oral QHS  ? ?Continuous Infusions: ? sodium chloride    ? ?PRN Meds: ?sodium chloride, acetaminophen, albuterol, cyclobenzaprine, ondansetron (ZOFRAN) IV, oxyCODONE-acetaminophen **AND** oxyCODONE, sodium chloride flush  ? ?Vital Signs  ?  ?Vitals:  ? 06/10/21 1616 06/10/21 2004 06/10/21 2345 06/11/21 0440  ?BP: 123/82 139/80 (!) 144/90 (!) 144/96  ?Pulse: 80 94 83 78  ?Resp: '14  11 11  '$ ?Temp: 97.9 ?F (36.6 ?C) 98.2 ?F (36.8 ?C) 98.2 ?F (36.8 ?C) 98.2 ?F (36.8 ?C)  ?TempSrc: Oral Oral Oral Oral  ?SpO2: 98% 96% 98% 98%  ?Weight:      ?Height:      ? ?No intake or output data in the 24 hours ending 06/11/21 1025 ?Filed Weights  ? 06/10/21 0600  ?Weight: 115.7 kg  ? ? ?Telemetry  ?  ?NSR - Personally Reviewed ? ?ECG  ?  ?NA - Personally Reviewed ? ?Physical Exam  ? ?GEN: No acute distress.   ?Neck: No  JVD ?Cardiac: RRR, no murmurs, rubs, or gallops.  ?Respiratory: Clear  to auscultation bilaterally. ?GI: Soft, nontender, non-distended  ?MS: No  edema; No deformity.  Right radial without bleeding or bruising ?Neuro:  Nonfocal  ?Psych: Normal affect  ? ?Labs  ?  ?Chemistry ?Recent Labs  ?Lab 06/07/21 ?0855 06/11/21 ?0335  ?NA 134 136  ?K 4.5 4.4  ?CL 94* 101  ?CO2 26 29  ?GLUCOSE 131* 118*  ?BUN 14 13  ?CREATININE 0.77 0.88  ?CALCIUM 9.3  8.6*  ?GFRNONAA  --  >60  ?ANIONGAP  --  6  ?  ? ?Hematology ?Recent Labs  ?Lab 06/07/21 ?0855 06/11/21 ?0335  ?WBC 6.4 8.6  ?RBC 5.35 4.85  ?HGB 15.9 14.3  ?HCT 46.4 43.8  ?MCV 87 90.3  ?MCH 29.7 29.5  ?MCHC 34.3 32.6  ?RDW 13.2 13.8  ?PLT 250 226  ? ? ?Cardiac EnzymesNo results for input(s): TROPONINI in the last 168 hours. No results for input(s): TROPIPOC in the last 168 hours.  ? ?BNPNo results for input(s): BNP, PROBNP in the last 168 hours.  ? ?DDimer No results for input(s): DDIMER in the last 168 hours.  ? ?Radiology  ?  ?CARDIAC CATHETERIZATION ? ?Addendum Date: 06/10/2021   ?  Dist LAD lesion is 95% stenosed.  A drug-eluting stent was successfully placed using a STENT ONYX FRONTIER 2.0X30, postdilated to 2.25 mm.   Post intervention, there is a 0% residual stenosis.   Mid LAD lesion is 80% stenosed.  A drug-eluting stent was successfully placed using a STENT ONYX FRONTIER 3.5X15, postdilated to 3.75 mm and optimized with intravascular ultrasound.  Post intervention, there is a 0% residual stenosis.   1st Diag lesion is 90% stenosed.  Balloon angioplasty was performed using a BALLN SAPPHIRE 2.0X10.   Post intervention, there is a 40% residual stenosis.   distal RPDA lesion is 100% stenosed.  Left to right collaterals.   The left ventricular systolic function is normal.   LV end diastolic pressure is normal.   The left ventricular ejection fraction is 55-65% by visual estimate.   There is no aortic valve stenosis. High bifurcation of right radial from brachial artery.  Very small radial which will not accept a 6 French sheath.  4 French catheters did fit but intervention was done from the right femoral artery. In the future, if cath is considered, could try right ulnar artery if radial remains patent. Successful PCI of the mid LAD, distal LAD and first diagonal ostium.  Continue dual antiplatelet therapy for at least 6 months.  After 6 months, would consider clopidogrel monotherapy due to diffuse disease  noted in the RCA. Watch overnight.  Plan for discharge in a.m. ? ?Addendum Date: 06/10/2021   ?  Dist LAD lesion is 95% stenosed.  A drug-eluting stent was successfully placed using a STENT ONYX FRONTIER 2.0X30, postdilated to 2.25 mm.   Post intervention, there is a 0% residual stenosis.   Mid LAD lesion is 80% stenosed.  A drug-eluting stent was successfully placed using a STENT ONYX FRONTIER 3.5X15, postdilated to 3.75 mm and optimized with intravascular ultrasound.   Post intervention, there is a 0% residual stenosis.   1st Diag lesion is 90% stenosed.  Balloon angioplasty was performed using a BALLN SAPPHIRE 2.0X10.   Post intervention, there is a 40% residual stenosis.   distal RPDA lesion is 100% stenosed.  Left to right collaterals.   The left ventricular systolic function is normal.   LV end diastolic pressure is normal.   The left ventricular ejection fraction is 55-65% by visual estimate.   There is no aortic valve stenosis. High bifurcation of right radial from brachial artery.  Very small radial which will not accept a 6 French sheath.  4 French catheters did fit but intervention was done from the right femoral artery. In the future, if cath is considered, could try right ulnar artery if radial remains patent. Successful PCI of the mid LAD, distal LAD and first diagonal ostium.  Continue dual antiplatelet therapy for at least 6 months.  After 6 months, would consider clopidogrel monotherapy due to diffuse disease noted in the RCA. Watch overnight.  Plan for discharge in a.m. ? ?Result Date: 06/10/2021 ?  Dist LAD lesion is 95% stenosed.  A drug-eluting stent was successfully placed using a STENT ONYX FRONTIER 2.0X30, postdilated to 2.25 mm.   Post intervention, there is a 0% residual stenosis.   Mid LAD lesion is 80% stenosed.  A drug-eluting stent was successfully placed using a STENT ONYX FRONTIER 3.5X15, postdilated to 3.75 mm and optimized with intravascular ultrasound.   Post intervention, there is a  0% residual stenosis.   1st Diag lesion is 90% stenosed.  Balloon angioplasty was performed using a BALLN SAPPHIRE 2.0X10.   Post intervention, there is a 40% residual stenosis.   distal RPDA lesion is 100% stenosed.  Left to right collaterals.   The left ventricular systolic function is normal.   LV end diastolic pressure is normal.   The left ventricular ejection fraction is 55-65% by visual estimate.   There is no aortic valve stenosis. High bifurcation  of right radial from brachial artery.  Very small radial which will not accept a 6 French sheath.  4 French catheters did fit but intervention was done from the right femoral artery. In the future, if cath is considered, could try right ulnar artery if radial remains patent. Successful PCI of the mid LAD, distal LAD and first diagonal ostium.  Continue dual antiplatelet therapy for at least 6 months.  After 6 months, would consider clopidogrel monotherapy due to diffuse disease noted in the RCA. Watch overnight.  Plan for discharge in a.m.   ? ?Cardiac Studies  ? ?Diagnostic ?Dominance: Right ?Intervention ? ? ? ? ?Patient Profile  ?   ?66 y.o. male with a history of DM, large cell lymphoma (in remission), COPD  The patient is followed by Dr Jerline Pain    He had a Ca score done in April that showed a Ca score of 294 (74%ile for age).    Admitted with unstable angina and now post PCI as above ? ?Assessment & Plan  ?  ?UNSTABLE ANGINA:   Successful PCI of the mid LAD, distal LAD and first diagonal ostium.  Continue dual antiplatelet therapy for at least 6 months.  After 6 months, would consider clopidogrel monotherapy due to diffuse disease noted in the RCA.   Walked with rehab.  Reluctant to take meds but I think he agrees ? ?DYSLIPIDEMIA:  Agrees to start statin.   LDL was 129.  I would suggest at least Crestor 20 mg and repeat lipid and liver in about 10 weeks.  ? ?DM:  A1C 7.0.  Plan per [name], MD ? ?ABNORMAL CT:  He has follow up scheduled with [name], MD for further imaging.  I reminded him of this. ? ?HTN: BP is up a little ut but has been OK.  I am not convinced that he will take any ARB or other med for his BP.  I would at least suggest a BP

## 2021-06-12 ENCOUNTER — Encounter: Payer: Self-pay | Admitting: Family Medicine

## 2021-06-13 ENCOUNTER — Other Ambulatory Visit (HOSPITAL_COMMUNITY): Payer: Self-pay

## 2021-06-13 ENCOUNTER — Encounter (HOSPITAL_COMMUNITY): Payer: Self-pay | Admitting: Interventional Cardiology

## 2021-06-13 LAB — LIPOPROTEIN A (LPA): Lipoprotein (a): 34.6 nmol/L — ABNORMAL HIGH (ref <75.0)

## 2021-06-13 NOTE — Telephone Encounter (Signed)
See note

## 2021-06-14 NOTE — Telephone Encounter (Signed)
We discussed this at his office visit last week. IT is probably just scarring but we can repeat the CT scan just to make sure. Please place order for CT chest with contrast if he is agreeable. ? ?Algis Greenhouse. Jerline Pain, MD ?06/14/2021 1:34 PM  ? ?

## 2021-06-16 ENCOUNTER — Other Ambulatory Visit (HOSPITAL_COMMUNITY): Payer: Self-pay

## 2021-06-21 ENCOUNTER — Telehealth (HOSPITAL_COMMUNITY): Payer: Self-pay

## 2021-06-21 NOTE — Telephone Encounter (Signed)
Pt is not interested in the cardiac rehab program. Closed referral 

## 2021-06-30 ENCOUNTER — Telehealth (HOSPITAL_BASED_OUTPATIENT_CLINIC_OR_DEPARTMENT_OTHER): Payer: Self-pay

## 2021-06-30 ENCOUNTER — Other Ambulatory Visit (HOSPITAL_COMMUNITY): Payer: Self-pay

## 2021-06-30 NOTE — Progress Notes (Unsigned)
Cardiology Office Note:    Date:  07/01/2021   ID:  Jeffery Wood, DOB 08/30/1955, MRN [id number]  PCP:  [name], MD   [facility] Providers Cardiologist:  [name], MD     Referring MD: [name], MD   Chief Complaint: hospital f/u CAD s/p DES   History of Present Illness:    Jeffery Wood is a 66 y.o. male with a hx of CAD s/p PCI/DES to mid LAD, distal LAD, and first diagonal ostium, diabetes, large cell lymphoma (in remission), COPD  He was seen on 06/07/2021 in our office by Dr. Harrington Challenger for review of calcium score and chest pain number referred by PCP. Calcium score in April was 294, 74th percentile for age/sex matched control.  He reported intermittent chest discomfort with and without activity, worse with activity.  C/o fatige and "just gives out". Reported that he thought he had a cath many years ago but no specific details given. He was scheduled for LHC due to symptoms. LHC on 06/10/21 revealed 95% distal LAD lesion treated with DES, 80% mid LAD lesion treated with DES, 90% D1 lesion treated with balloon angioplasty reducing the blockage down to 40%, distal RPDA occlusion with left-to-right collaterals, EF 55 to 65% by LV gram. DAPT with aspirin and clopidogrel x6 months, plan for clopidogrel monotherapy due to diffuse disease noted in the RCA after 6 months.  He was discharged home on 06/11/2021.  Today, he reports he is not feeling well. Chest discomfort has improved but he still has some mild chest discomfort.  Is not worse with exertion.  He denies dyspnea.  Unable to walk long distances due to knee pain.  Did some yard work and got underneath a house to change filters and afterwards was extremely fatigued, had to sit for 4 hours.he reported fatigue to Dr. Harrington Challenger prior to PCI, admits it is not as bad as it was prior to cath. Thought he was better until yesterday the work he did wiped him out.  Does not drink water at all - drinks a monster drink then caffeine free [place] all day Eats a lot of processed meat, bagels, does not eat lunch.  He feels that his diabetes is well controlled because there are no signs of retinopathy and certain medications have caused his blood sugar to bottom out.  Reports previous significant hypotension on antihypertensives.  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    Cancer [facility])    basal cell of right temple; carcinoids   Complication of anesthesia    hard to wake up after bronchoscopy   COPD (chronic obstructive pulmonary disease) ([place])    Diabetes ([place])    type 2   Dyspnea    GERD (gastroesophageal reflux disease)    Gout    resolved, no current problem   Headache    History of hiatal hernia 05/16/2006   small noted on EGD   Hypertension    hx   Lung mass    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[place])    last chemo 05/2017, in remission   Memory loss    mild - r/t chemo, 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   Neuromuscular disorder ([place])    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   Sleep apnea    does not use anything for OSA    Past Surgical History:  Procedure Laterality Date   BACK SURGERY     L4-L5 bracket and screws   BICEPS TENDON REPAIR Left    CARPAL TUNNEL RELEASE     COLONOSCOPY     CORONARY BALLOON ANGIOPLASTY N/A 06/10/2021   Procedure: CORONARY BALLOON ANGIOPLASTY;  Surgeon: Jettie Booze, MD;  Location: [place] CV LAB;  Service: Cardiovascular;  Laterality: N/A;   CORONARY STENT INTERVENTION N/A 06/10/2021   Procedure: CORONARY STENT INTERVENTION;  Surgeon: Jettie Booze, MD;  Location: [place] [facility];  Service: Cardiovascular;  Laterality: N/A;   FINGER SURGERY     INTRAVASCULAR ULTRASOUND/IVUS N/A 06/10/2021   Procedure: Intravascular Ultrasound/IVUS;  Surgeon: Jettie Booze, MD;  Location: [place] [facility];  Service: Cardiovascular;  Laterality: N/A;   KNEE ARTHROSCOPY     ACL tear   LEFT HEART CATH AND  CORONARY ANGIOGRAPHY N/A 06/10/2021   Procedure: LEFT HEART CATH AND CORONARY ANGIOGRAPHY;  Surgeon: Jettie Booze, MD;  Location: [place] [facility];  Service: Cardiovascular;  Laterality: N/A;   LEFT HEART CATHETERIZATION WITH CORONARY ANGIOGRAM N/A 12/07/2010   Procedure: LEFT HEART CATHETERIZATION WITH CORONARY ANGIOGRAM;  Surgeon: Leonie Man, MD;  Location[facility] C[facility] LAB;  Service: Cardiovascular;  Laterality: N/A;   LUMBAR LAMINECTOMY/DECOMPRESSION MICRODISCECTOMY Right 12/09/2020   Procedure: MICRODISCECTOMY, RIGHT LUMBAR TWO-THREE, LUMBAR THREE-FOUR;  Surgeon: Newman Pies, MD;  Location: [place];  Service: Neurosurgery;  Laterality: Right;   PILONIDAL CYST EXCISION     PORT-A-CATH REMOVAL N/A 11/09/2017   Procedure: REMOVAL PORT-A-CATH;  Surgeon: [name], MD;  Location: [place];  Service: General;  Laterality: N/A;  local   PORTACATH PLACEMENT Left 02/27/2017   Procedure: INSERTION PORT-A-CATH;  Surgeon: [name], MD;  Location: [facility];  Service: General;  Laterality: Left;   UPPER GASTROINTESTINAL ENDOSCOPY  05/16/2006   VIDEO BRONCHOSCOPY WITH ENDOBRONCHIAL ULTRASOUND N/A 01/24/2017   Procedure: VIDEO BRONCHOSCOPY WITH ENDOBRONCHIAL ULTRASOUND with TRANSBRONCHIAL BIOPSY;  Surgeon: Grace Isaac, MD;  Location: [place];  Service: Thoracic;  Laterality: N/A;   VIDEO MEDIASTINOSCOPY N/A 02/05/2017   Procedure: VIDEO MEDIASTINOSCOPY;  Surgeon: Grace Isaac, MD;  Location: [place];  Service: Thoracic;  Laterality: N/A;    Current Medications: Current Meds  Medication Sig   albuterol (PROVENTIL) (2.5 MG/3ML) 0.083% nebulizer solution Take 2.5 mg by nebulization every 4 (four) hours as needed for wheezing or shortness of breath.   aspirin EC 81 MG tablet Take 81 mg by mouth daily.   atorvastatin (LIPITOR) 40 MG tablet Take 1 tablet (40 mg total) by mouth daily.   augmented betamethasone dipropionate (DIPROLENE-AF) 0.05 % cream APPLY AS  DIRECTED TO AFFECTED AREAS ON THE SKIN   clonazePAM (KLONOPIN) 0.5 MG tablet TAKE 1 TABLET BY MOUTH EVERY NIGHT AT BEDTIME   clopidogrel (PLAVIX) 75 MG tablet Take 1 tablet (75 mg total) by mouth daily with breakfast.   cyclobenzaprine (FLEXERIL) 10 MG tablet Take 1 tablet (10 mg total) by mouth 3 (three) times daily as needed for muscle spasms.   docusate sodium (COLACE) 100 MG capsule Take 1 capsule (100 mg total) by mouth 2 (two) times daily.   DULoxetine (CYMBALTA) 30 MG capsule TAKE 1 CAPSULE (30 MG TOTAL) BY MOUTH AT BEDTIME.   gabapentin (NEURONTIN) 300 MG capsule Take 3 capsules (900 mg total) by mouth 3 (three) times daily.   glimepiride (AMARYL) 4 MG tablet Take 1/2 tablet by mouth before biggest meal of the day.   levalbuterol [facility] HFA)  45 MCG/ACT inhaler Inhale 2 puffs into the lungs daily as needed for wheezing or shortness of breath.   loratadine (CLARITIN) 10 MG tablet Take 10 mg by mouth daily.   Menthol, Topical Analgesic, (ASPERCREME MAX ROLL-ON EX) Apply 1 application. topically daily as needed (Back pain).   metFORMIN (GLUCOPHAGE) 1000 MG tablet TAKE ONE (1) TABLET BY MOUTH TWO (2) TIMES DAILY   nitroGLYCERIN (NITROSTAT) 0.4 MG SL tablet Place 1 tablet (0.4 mg total) under the tongue every 5 (five) minutes as needed for chest pain.   oxyCODONE-acetaminophen (PERCOCET) 10-325 MG tablet Take 1 tablet by mouth every 4 (four) hours as needed for pain.   pantoprazole (PROTONIX) 40 MG tablet Take 1 tablet (40 mg total) by mouth daily.   tamsulosin (FLOMAX) 0.4 MG CAPS capsule TAKE 1 CAPSULE (0.4 MG TOTAL) BY MOUTH DAILY.   terbinafine (LAMISIL) 250 MG tablet Take 1 tablet (250 mg total) by mouth daily.   tiotropium (SPIRIVA HANDIHALER) 18 MCG inhalation capsule PLACE ONE CAPSULE IN THE INHALER AND INHALE ONCE DAILY   traMADol (ULTRAM) 50 MG tablet Take 1 tablet (50 mg total) by mouth every 8 (eight) hours as needed.     Allergies:   Patient has no known allergies.   Social  History   Socioeconomic History   Marital status: Married    Spouse name: Not on file   Number of children: Not on file   Years of education: Not on file   Highest education level: Not on file  Occupational History   Occupation: retired  Tobacco Use   Smoking status: Former    Packs/day: 3.00    Years: 20.00    Pack years: 60.00    Types: Cigarettes    Quit date: 01/31/1988    Years since quitting: 33.4   Smokeless tobacco: Never  Vaping Use   Vaping Use: Never used  Substance and Sexual Activity   Alcohol use: No   Drug use: No   Sexual activity: Yes    Partners: Female    Birth control/protection: None  Other Topics Concern   Not on file  Social History Narrative   Not on file   Social Determinants of Health   Financial Resource Strain: Not on file  Food Insecurity: Not on file  Transportation Needs: Not on file  Physical Activity: Not on file  Stress: Not on file  Social Connections: Not on file     Family History: The patient's family history includes Asthma in his mother; Emphysema in his mother; Heart disease in his father.  ROS:   Please see the history of present illness.    + fatigue + chest pain All other systems reviewed and are negative.  Labs/Other Studies Reviewed:    The following studies were reviewed today:  LHC 06/10/21    Dist LAD lesion is 95% stenosed.  A drug-eluting stent was successfully placed using a STENT ONYX FRONTIER 2.0X30, postdilated to 2.25 mm.   Post intervention, there is a 0% residual stenosis.   Mid LAD lesion is 80% stenosed.  A drug-eluting stent was successfully placed using a STENT ONYX FRONTIER 3.5X15, postdilated to 3.75 mm and optimized with intravascular ultrasound.   Post intervention, there is a 0% residual stenosis.   1st Diag lesion is 90% stenosed.  Balloon angioplasty was performed using a BALLN SAPPHIRE 2.0X10.   Post intervention, there is a 40% residual stenosis.   distal RPDA lesion is 100% stenosed.   Left to right collaterals.  The left ventricular systolic function is normal.   LV end diastolic pressure is normal.   The left ventricular ejection fraction is 55-65% by visual estimate.   There is no aortic valve stenosis.   High bifurcation of right radial from brachial artery.  Very small radial which will not accept a 6 French sheath.  4 French catheters did fit but intervention was done from the right femoral artery.   In the future, if cath is considered, could try right ulnar artery if radial remains patent.   Successful PCI of the mid LAD, distal LAD and first diagonal ostium.  Continue dual antiplatelet therapy for at least 6 months.  After 6 months, would consider clopidogrel monotherapy due to diffuse disease noted in the RCA.    CT Cardiac Score 05/19/21  Coronary arteries: Normal origins.   Coronary Calcium Score:   Left main: 64   Left anterior descending artery: 223   Left circumflex artery: 1   Right coronary artery: 6   Total: 294   Percentile: 74th   Pericardium: Normal.   Ascending Aorta: Dilated ascending aorta measuring 81m   [facility]card[facility]: [facility]   IMPRESSION: 1. Coronary calcium score of 294. This was 74th percentile for age-, race-, and sex-matched controls.   2. 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 Dilated main pulmonary artery measuring 3063mEcho 02/08/2017    - Left ventricle: The cavity size was normal. There was mild    concentric hypertrophy. Systolic function was normal. The    estimated ejection fraction was in the range of 60% to 65%.    Although no diagnostic regional wall motion abnormality was    identified, this possibility cannot be completely excluded on the    basis of this study. Doppler parameters are consistent with    abnormal left ventricular relaxation (grade 1 diastolic    dysfunction).  - Ventricular septum: Septal motion showed &quot;bounce&quot;. These  changes    are consistent with RV-LV interaction.  - Aortic valve: Mildly calcified annulus. Trileaflet; normal    thickness leaflets.   Complications:  Echo was requested to be completed quickly for  chemo and port access.   Recent Labs: 11/08/2020: ALT 9 06/07/2021: TSH 0.947 06/11/2021: BUN 13; Creatinine, Ser 0.88; Hemoglobin 14.3; Platelets 226; Potassium 4.4; Sodium 136  Recent Lipid Panel    Component Value Date/Time   CHOL 184 11/08/2020 0840   TRIG 97.0 11/08/2020 0840   HDL 35.80 (L) 11/08/2020 0840   CHOLHDL 5 11/08/2020 0840   VLDL 19.4 11/08/2020 0840   LDLCALC 129 (H) 11/08/2020 0840   LDLCALC 119 (H) 12/16/2019 0847     Risk Assessment/Calculations:       Physical Exam:    VS:  BP 130/70   Pulse 84   Ht '6\' 3"'$  (1.905 m)   Wt 257 lb 12.8 oz (116.9 kg)   SpO2 95%   BMI 32.22 kg/m     Wt Readings from Last 3 Encounters:  07/01/21 257 lb 12.8 oz (116.9 kg)  06/10/21 255 lb (115.7 kg)  06/09/21 259 lb 12.8 oz (117.8 kg)     GEN:  Well nourished, well developed in no acute distress HEENT: Normal NECK: No JVD; No carotid bruits LYMPHATICS: No lymphadenopathy CARDIAC: RRR, no murmurs, rubs, gallops RESPIRATORY:  Clear to auscultation without rales, wheezing or rhonchi  ABDOMEN: Soft, non-tender, non-distended MUSCULOSKELETAL:  No edema; No deformity  SKIN: Warm and dry NEUROLOGIC:  Alert and oriented  x 3 PSYCHIATRIC:  Normal affect   EKG:  EKG is ordered today.  The ekg ordered today demonstrates NSR at 84 bpm, low voltage QRS, no acute change from previous  Diagnoses:    1. Hyperlipidemia LDL goal <70   2. Fatigue, unspecified type   3. Coronary artery disease involving native heart, unspecified vessel or lesion type, unspecified whether angina present   4. Cardiac risk counseling    Assessment and Plan:     CAD s/p DES: He has occasional chest discomfort. Unclear as to whether this is angina.  Admits to feeling improvement since office visit prior to  stenting, however he continues to have occasional left-sided chest pains. Do not consistently occur with or without exertion. Continues to have fatigue, although admits it is better than prior to PCI.  He does not want to add medication if not necessary. Advised him to notify us if chest pain worsens.  We discussed the importance of DAPT. He verbalized understanding. No bleeding problems on aspirin and Plavix.  Hyperlipidemia LDL goal < 70: LDL 129 on 11/08/20.  We discussed statin therapy, however after he left I realized he did not receive his outpatient prescription for statin.  I left him a message on his cell asking him to start atorvastatin 40 mg once daily and we will recheck lipid and liver panel in 8 weeks.  Provided information on heart healthy mostly plant-based diet on his AVS  Fatigue: Improved post-PCI. Had a bad day yesterday. Encouraged better hydration with increased water consumption, less diet soda and to follow a heart healthy mostly plant-based diet.  Advised him that if symptoms worsen prior to next appointment to notify us.  Cardiovascular risk reduction: We had a long discussion about heart healthy diet and regular exercise.  He feels that his diabetes is well controlled, however A1c is 7.0, he has hyperlipidemia, and BMI is 32.  Encouraged him to review information given today for the benefit of his overall health.     Disposition: 3 months with Dr. Harrington Challenger  Medication Adjustments/Labs and Tests Ordered: Current medicines are reviewed at length with the patient today.  Concerns regarding medicines are outlined above.  Orders Placed This Encounter  Procedures   Comprehensive metabolic panel   Lipid panel   EKG 12-Lead   Meds ordered this encounter  Medications   atorvastatin (LIPITOR) 40 MG tablet    Sig: Take 1 tablet (40 mg total) by mouth daily.    Dispense:  90 tablet    Refill:  3    Order Specific Question:   Supervising Provider    Answer:   Thayer Headings  [phone]    Patient Instructions  Medication Instructions:  Your physician recommends that you continue on your current medications as directed. Please refer to the Current Medication list given to you today.  *If you need a refill on your cardiac medications before your next appointment, please call your pharmacy*   Lab Work: Lipids and CMET in 1 month If you have labs (blood work) drawn today and your tests are completely normal, you will receive your results only by: [place] (if you have MyChart) OR A paper copy in the mail If you have any lab test that is abnormal or we need to change your treatment, we will call you to review the results.  Follow-Up: At [facility], you and your health needs are our priority.  As part of our continuing mission to provide you with exceptional heart care,  we have created designated Provider Care Teams.  These Care Teams include your primary Cardiologist (physician) and Advanced Practice Providers (APPs -  Physician Assistants and Nurse Practitioners) who all work together to provide you with the care you need, when you need it.   Your next appointment:   3 month(s)  The format for your next appointment:   In Person  Provider:   [name], MD {  Adopting a Healthy Lifestyle.   Weight: Know what a healthy weight is for you (roughly BMI <25) and aim to maintain this. You can calculate your body mass index on your smart phone  Diet: Aim for 7+ servings of fruits and vegetables daily Limit animal fats in diet for cholesterol and heart health - choose grass fed whenever available Avoid highly processed foods (fast food burgers, tacos, fried chicken, pizza, hot dogs, french fries)  Saturated fat comes in the form of butter, lard, coconut oil, margarine, partially hydrogenated oils, and fat in meat. These increase your risk of cardiovascular disease.  Use healthy plant oils, such as olive, canola, soy, corn, sunflower and peanut.  Whole  foods such as fruits, vegetables and whole grains have fiber  Men need > 38 grams of fiber per day Women need > 25 grams of fiber per day  Load up on vegetables and fruits - one-half of your plate: Aim for color and variety, and remember that potatoes dont count. Go for whole grains - one-quarter of your plate: Whole wheat, barley, wheat berries, quinoa, oats, brown rice, and foods made with them. If you want pasta, go with whole wheat pasta. Protein power - one-quarter of your plate: Fish, chicken, beans, and nuts are all healthy, versatile protein sources. Limit red meat. You need carbohydrates for energy! The type of carbohydrate is more important than the amount. Choose carbohydrates such as vegetables, fruits, whole grains, beans, and nuts in the place of [place] rice, [place] pasta, potatoes (baked or fried), macaroni and cheese, cakes, cookies, and donuts.  If youre thirsty, drink water. Coffee and tea are good in moderation, but skip sugary drinks and limit milk and dairy products to one or two daily servings. Keep sugar intake at 6 teaspoons or 24 grams or LESS       Exercise: Aim for 150 min of moderate intensity exercise weekly for heart health, and weights twice weekly for bone health Stay active - any steps are better than no steps! Aim for 7-9 hours of sleep daily        Important Information About Sugar           Signed, Emmaline Life, NP  07/01/2021 4:49 PM    [place] =

## 2021-06-30 NOTE — Telephone Encounter (Signed)
Transitions of Care Pharmacy   Call attempted for a pharmacy transitions of care follow-up. HIPAA appropriate voicemail was left with call back information provided.   Call attempt #1. Will follow-up in 2-3 days.   Darcus Austin, [place] 06/30/2021 1:29 PM

## 2021-07-01 ENCOUNTER — Encounter: Payer: Self-pay | Admitting: Nurse Practitioner

## 2021-07-01 ENCOUNTER — Other Ambulatory Visit (HOSPITAL_COMMUNITY): Payer: Self-pay

## 2021-07-01 ENCOUNTER — Ambulatory Visit: Payer: Medicare PPO | Admitting: Nurse Practitioner

## 2021-07-01 VITALS — BP 130/70 | HR 84 | Ht 75.0 in | Wt 257.8 lb

## 2021-07-01 DIAGNOSIS — I2510 Atherosclerotic heart disease of native coronary artery without angina pectoris: Secondary | ICD-10-CM

## 2021-07-01 DIAGNOSIS — R5383 Other fatigue: Secondary | ICD-10-CM

## 2021-07-01 DIAGNOSIS — Z7189 Other specified counseling: Secondary | ICD-10-CM

## 2021-07-01 DIAGNOSIS — E785 Hyperlipidemia, unspecified: Secondary | ICD-10-CM

## 2021-07-01 MED ORDER — ATORVASTATIN CALCIUM 40 MG PO TABS
40.0000 mg | ORAL_TABLET | Freq: Every day | ORAL | 3 refills | Status: DC
  Filled 2021-07-01: qty 90, 90d supply, fill #0
  Filled 2021-08-05 – 2021-09-23 (×3): qty 90, 90d supply, fill #1
  Filled 2021-12-30: qty 90, 90d supply, fill #2
  Filled 2022-03-27: qty 90, 90d supply, fill #3

## 2021-07-01 NOTE — Patient Instructions (Addendum)
Medication Instructions:  Your physician recommends that you continue on your current medications as directed. Please refer to the Current Medication list given to you today.  *If you need a refill on your cardiac medications before your next appointment, please call your pharmacy*   Lab Work: Lipids and CMET in 1 month If you have labs (blood work) drawn today and your tests are completely normal, you will receive your results only by: [place] (if you have MyChart) OR A paper copy in the mail If you have any lab test that is abnormal or we need to change your treatment, we will call you to review the results.  Follow-Up: At [facility], you and your health needs are our priority.  As part of our continuing mission to provide you with exceptional heart care, we have created designated Provider Care Teams.  These Care Teams include your primary Cardiologist (physician) and Advanced Practice Providers (APPs -  Physician Assistants and Nurse Practitioners) who all work together to provide you with the care you need, when you need it.   Your next appointment:   3 month(s)  The format for your next appointment:   In Person  Provider:   [name], MD {  Adopting a Healthy Lifestyle.   Weight: Know what a healthy weight is for you (roughly BMI <25) and aim to maintain this. You can calculate your body mass index on your smart phone  Diet: Aim for 7+ servings of fruits and vegetables daily Limit animal fats in diet for cholesterol and heart health - choose grass fed whenever available Avoid highly processed foods (fast food burgers, tacos, fried chicken, pizza, hot dogs, french fries)  Saturated fat comes in the form of butter, lard, coconut oil, margarine, partially hydrogenated oils, and fat in meat. These increase your risk of cardiovascular disease.  Use healthy plant oils, such as olive, canola, soy, corn, sunflower and peanut.  Whole foods such as fruits, vegetables and  whole grains have fiber  Men need > 38 grams of fiber per day Women need > 25 grams of fiber per day  Load up on vegetables and fruits - one-half of your plate: Aim for color and variety, and remember that potatoes dont count. Go for whole grains - one-quarter of your plate: Whole wheat, barley, wheat berries, quinoa, oats, brown rice, and foods made with them. If you want pasta, go with whole wheat pasta. Protein power - one-quarter of your plate: Fish, chicken, beans, and nuts are all healthy, versatile protein sources. Limit red meat. You need carbohydrates for energy! The type of carbohydrate is more important than the amount. Choose carbohydrates such as vegetables, fruits, whole grains, beans, and nuts in the place of [place] rice, [place] pasta, potatoes (baked or fried), macaroni and cheese, cakes, cookies, and donuts.  If youre thirsty, drink water. Coffee and tea are good in moderation, but skip sugary drinks and limit milk and dairy products to one or two daily servings. Keep sugar intake at 6 teaspoons or 24 grams or LESS       Exercise: Aim for 150 min of moderate intensity exercise weekly for heart health, and weights twice weekly for bone health Stay active - any steps are better than no steps! Aim for 7-9 hours of sleep daily        Important Information About Sugar

## 2021-07-06 ENCOUNTER — Other Ambulatory Visit (HOSPITAL_COMMUNITY): Payer: Self-pay

## 2021-07-06 ENCOUNTER — Telehealth (HOSPITAL_COMMUNITY): Payer: Self-pay | Admitting: Pharmacist

## 2021-07-06 NOTE — Telephone Encounter (Signed)
Pharmacy Transitions of Care Follow-up Telephone Call   Date of discharge: 06/11/21  Discharge Diagnosis: CAD   How have you been since you were released from the hospital? "So far, so good". Reports some chest pain that cardiologist is aware of and was instructed to monitor.    Medication changes made at discharge:   START taking these medications  clopidogrel 75 MG tablet Commonly known as: PLAVIX Take 1 tablet (75 mg total) by mouth daily with breakfast.  nitroGLYCERIN 0.4 MG SL tablet Commonly known as: Nitrostat Place 1 tablet (0.4 mg total) under the tongue every 5 (five) minutes as needed for chest pain.  pantoprazole 40 MG tablet Commonly known as: PROTONIX Take 1 tablet (40 mg total) by mouth daily. Replaces: omeprazole 20 MG capsule       CHANGE how you take these medications  augmented betamethasone dipropionate 0.05 % cream Commonly known as: DIPROLENE-AF APPLY AS DIRECTED TO AFFECTED AREAS ON THE SKIN What changed:  how much to take when to take this reasons to take this  docusate sodium 100 MG capsule Commonly known as: COLACE Take 1 capsule (100 mg total) by mouth 2 (two) times daily. What changed:  how much to take when to take this additional instructions  gabapentin 300 MG capsule Commonly known as: NEURONTIN Take 3 capsules (900 mg total) by mouth 3 (three) times daily. What changed: when to take this  traMADol 50 MG tablet Commonly known as: ULTRAM Take 1 tablet (50 mg total) by mouth every 8 (eight) hours as needed. What changed:  how much to take how to take this when to take this       CONTINUE taking these medications  albuterol (2.5 MG/3ML) 0.083% nebulizer solution Commonly known as: PROVENTIL  ASPERCREME MAX ROLL-ON EX  aspirin EC 81 MG tablet  clonazePAM 0.5 MG tablet Commonly known as: KLONOPIN TAKE 1 TABLET BY MOUTH EVERY NIGHT AT BEDTIME  cyclobenzaprine 10 MG tablet Commonly known as: FLEXERIL Take 1 tablet (10 mg total) by  mouth 3 (three) times daily as needed for muscle spasms.  DULoxetine 30 MG capsule Commonly known as: CYMBALTA TAKE 1 CAPSULE (30 MG TOTAL) BY MOUTH AT BEDTIME.  glimepiride 4 MG tablet Commonly known as: AMARYL Take 1/2 tablet by mouth before biggest meal of the day.  levalbuterol 45 MCG/ACT inhaler Commonly known as: XOPENEX HFA Inhale 2 puffs into the lungs daily as needed for wheezing or shortness of breath.  loratadine 10 MG tablet Commonly known as: CLARITIN  metFORMIN 1000 MG tablet Commonly known as: GLUCOPHAGE TAKE ONE (1) TABLET BY MOUTH TWO (2) TIMES DAILY  oxyCODONE-acetaminophen 10-325 MG tablet Commonly known as: PERCOCET Take 1 tablet by mouth every 4 (four) hours as needed for pain.  Spiriva HandiHaler 18 MCG inhalation capsule Generic drug: tiotropium PLACE ONE CAPSULE IN THE INHALER AND INHALE ONCE DAILY  tamsulosin 0.4 MG Caps capsule Commonly known as: FLOMAX TAKE 1 CAPSULE (0.4 MG TOTAL) BY MOUTH DAILY.  terbinafine 250 MG tablet Commonly known as: LAMISIL Take 1 tablet (250 mg total) by mouth daily.       STOP taking these medications  celecoxib 200 MG capsule Commonly known as: CELEBREX  omeprazole 20 MG capsule Commonly known as: PRILOSEC Replaced by: pantoprazole 40 MG tablet      Medication changes verified by the patient? Yes     Medication Accessibility:   Home Pharmacy: [facility]    Was the patient provided with refills on discharged medications? Yes  Have all prescriptions been transferred from [facility] to home pharmacy? Staying at [facility]    Is the patient able to afford medications? insured Notable copays: $12/90 day supply      Medication Review: CLOPIDOGREL (PLAVIX) Clopidogrel 75 mg once daily.  - Educated patient on expected duration of therapy of 6 months DAPT with clopidogrel and MD to consider Plavix after 6 months - Reviewed potential DDIs with patient  - Advised patient of medications to avoid (NSAIDs, ASA)  - Educated that  Tylenol (acetaminophen) will be the preferred analgesic to prevent risk of bleeding  - Emphasized importance of monitoring for signs and symptoms of bleeding (abnormal bruising, prolonged bleeding, nose bleeds, bleeding from gums, discolored urine, black tarry stools)  - Advised patient to alert all providers of anticoagulation therapy prior to starting a new medication or having a procedure    Follow-up Appointments: Had appointment with [place], M on 6/2   If their condition worsens, is the pt aware to call PCP or go to the Emergency Dept.? Yes   Final Patient Assessment:  -Pt is doing fine. Reports some CP but cardiologist is aware. -Pt verbalized understanding of Plavix.  -Pt had post discharge appointment and meds filled at [facility].  Garnet Sierras, PharmD

## 2021-07-07 ENCOUNTER — Ambulatory Visit: Payer: Medicare PPO | Admitting: Physician Assistant

## 2021-07-08 ENCOUNTER — Other Ambulatory Visit (HOSPITAL_COMMUNITY): Payer: Self-pay

## 2021-07-11 ENCOUNTER — Other Ambulatory Visit (HOSPITAL_COMMUNITY): Payer: Self-pay

## 2021-07-12 ENCOUNTER — Ambulatory Visit (INDEPENDENT_AMBULATORY_CARE_PROVIDER_SITE_OTHER): Payer: Medicare PPO | Admitting: Family Medicine

## 2021-07-12 ENCOUNTER — Ambulatory Visit (INDEPENDENT_AMBULATORY_CARE_PROVIDER_SITE_OTHER)
Admission: RE | Admit: 2021-07-12 | Discharge: 2021-07-12 | Disposition: A | Discharge status: Home / Self Care | Payer: Medicare PPO | Source: Ambulatory Visit | Attending: Family Medicine | Admitting: Family Medicine

## 2021-07-12 ENCOUNTER — Encounter: Payer: Self-pay | Admitting: Family Medicine

## 2021-07-12 VITALS — BP 134/70 | HR 84 | Temp 98.0°F | Ht 75.0 in | Wt 258.4 lb

## 2021-07-12 DIAGNOSIS — R2232 Localized swelling, mass and lump, left upper limb: Secondary | ICD-10-CM | POA: Diagnosis not present

## 2021-07-12 DIAGNOSIS — M19012 Primary osteoarthritis, left shoulder: Secondary | ICD-10-CM | POA: Diagnosis not present

## 2021-07-12 DIAGNOSIS — R2230 Localized swelling, mass and lump, unspecified upper limb: Secondary | ICD-10-CM | POA: Diagnosis not present

## 2021-07-12 DIAGNOSIS — Z8572 Personal history of non-Hodgkin lymphomas: Secondary | ICD-10-CM | POA: Diagnosis not present

## 2021-07-12 NOTE — Progress Notes (Signed)
Subjective:     Patient ID: Jeffery Wood, male    DOB: 03/09/1955, 66 y.o.   MRN: [id number]  Chief Complaint  Patient presents with   Growth on shoulder    Knot on left shoulder that seems to be getting bigger that started 2 weeks ago Previous dx of cancer, what to make sure it isn't cancer     HPI H/o Lymphoma-chemo finished in 2019.    Sees onc regularly.  Due soon. No scans for 'awhile'.   Knot L shoulder for 2 wks.  Getting bigger. Saw knot in mirror. No pain. No f/c. No injury but h/o mult falls d/t chemo neuropathy/balance.    Health Maintenance Due  Topic Date Due   FOOT EXAM  Never done   URINE MICROALBUMIN  Never done    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    CAD (coronary artery disease) 2023   5v   Cancer ([place])    basal cell of right temple; carcinoids   Complication of anesthesia    hard to wake up after bronchoscopy   COPD (chronic obstructive pulmonary disease) ([place])    Diabetes ([place])    type 2   Dyspnea    GERD (gastroesophageal reflux disease)    Gout    resolved, no current problem   Headache    History of hiatal hernia 05/16/2006   small noted on EGD   Hypertension    hx   Lung mass    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[place])    last chemo 05/2017, in remission-chest, but all over   Memory loss    mild - r/t chemo, 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   Neuromuscular disorder ([place])    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   Sleep apnea    does not use anything for OSA    Past Surgical History:  Procedure Laterality Date   BACK SURGERY     L4-L5 bracket and screws   BICEPS TENDON REPAIR Left    CARPAL TUNNEL RELEASE     COLONOSCOPY     CORONARY BALLOON ANGIOPLASTY N/A 06/10/2021   Procedure: CORONARY BALLOON ANGIOPLASTY;  Surgeon: Jettie Booze, MD;  Location: [place] CV LAB;  Service: Cardiovascular;  Laterality: N/A;   CORONARY STENT INTERVENTION N/A  06/10/2021   Procedure: CORONARY STENT INTERVENTION;  Surgeon: Jettie Booze, MD;  Location: [place] CV LAB;  Service: Cardiovascular;  Laterality: N/A;   FINGER SURGERY     INTRAVASCULAR ULTRASOUND/IVUS N/A 06/10/2021   Procedure: Intravascular Ultrasound/IVUS;  Surgeon: Jettie Booze, MD;  Location: [place] CV LAB;  Service: Cardiovascular;  Laterality: N/A;   KNEE ARTHROSCOPY     ACL tear   LEFT HEART CATH AND CORONARY ANGIOGRAPHY N/A 06/10/2021   Procedure: LEFT HEART CATH AND CORONARY ANGIOGRAPHY;  Surgeon: Jettie Booze, MD;  Location: [place] CV LAB;  Service: Cardiovascular;  Laterality: N/A;   LEFT HEART CATHETERIZATION WITH CORONARY ANGIOGRAM N/A 12/07/2010   Procedure: LEFT HEART CATHETERIZATION WITH CORONARY ANGIOGRAM;  Surgeon: Leonie Man, MD;  Location: [facility] CATH LAB;  Service: Cardiovascular;  Laterality: N/A;   LUMBAR LAMINECTOMY/DECOMPRESSION MICRODISCECTOMY Right 12/09/2020   Procedure: MICRODISCECTOMY, RIGHT LUMBAR TWO-THREE, LUMBAR THREE-FOUR;  Surgeon: Newman Pies, MD;  Location: [name];  Service: Neurosurgery;  Laterality: Right;   PILONIDAL CYST EXCISION     PORT-A-CATH REMOVAL N/A  11/09/2017   Procedure: REMOVAL PORT-A-CATH;  Surgeon: [name], MD;  Location: [place];  Service: General;  Laterality: N/A;  local   PORTACATH PLACEMENT Left 02/27/2017   Procedure: INSERTION PORT-A-CATH;  Surgeon: [name], MD;  Location: [facility];  Service: General;  Laterality: Left;   UPPER GASTROINTESTINAL ENDOSCOPY  05/16/2006   VIDEO BRONCHOSCOPY WITH ENDOBRONCHIAL ULTRASOUND N/A 01/24/2017   Procedure: VIDEO BRONCHOSCOPY WITH ENDOBRONCHIAL ULTRASOUND with TRANSBRONCHIAL BIOPSY;  Surgeon: Grace Isaac, MD;  Location: [place];  Service: Thoracic;  Laterality: N/A;   VIDEO MEDIASTINOSCOPY N/A 02/05/2017   Procedure: VIDEO MEDIASTINOSCOPY;  Surgeon: Grace Isaac, MD;  Location: [place];  Service: Thoracic;   Laterality: N/A;    Outpatient Medications Prior to Visit  Medication Sig Dispense Refill   albuterol (PROVENTIL) (2.5 MG/3ML) 0.083% nebulizer solution Take 2.5 mg by nebulization every 4 (four) hours as needed for wheezing or shortness of breath.     aspirin EC 81 MG tablet Take 81 mg by mouth daily.     atorvastatin (LIPITOR) 40 MG tablet Take 1 tablet (40 mg total) by mouth daily. 90 tablet 3   augmented betamethasone dipropionate (DIPROLENE-AF) 0.05 % cream APPLY AS DIRECTED TO AFFECTED AREAS ON THE SKIN 50 g 99   clonazePAM (KLONOPIN) 0.5 MG tablet TAKE 1 TABLET BY MOUTH EVERY NIGHT AT BEDTIME 90 tablet 0   clopidogrel (PLAVIX) 75 MG tablet Take 1 tablet (75 mg total) by mouth daily with breakfast. 90 tablet 3   cyclobenzaprine (FLEXERIL) 10 MG tablet Take 1 tablet (10 mg total) by mouth 3 (three) times daily as needed for muscle spasms. 30 tablet 1   docusate sodium (COLACE) 100 MG capsule Take 1 capsule (100 mg total) by mouth 2 (two) times daily. 10 capsule 0   DULoxetine (CYMBALTA) 30 MG capsule TAKE 1 CAPSULE (30 MG TOTAL) BY MOUTH AT BEDTIME. 90 capsule 1   gabapentin (NEURONTIN) 300 MG capsule Take 3 capsules (900 mg total) by mouth 3 (three) times daily. 810 capsule 3   glimepiride (AMARYL) 4 MG tablet Take 1/2 tablet by mouth before biggest meal of the day. 60 tablet 1   levalbuterol (XOPENEX HFA) 45 MCG/ACT inhaler Inhale 2 puffs into the lungs daily as needed for wheezing or shortness of breath. 1 Inhaler 1   loratadine (CLARITIN) 10 MG tablet Take 10 mg by mouth daily.     Menthol, Topical Analgesic, (ASPERCREME MAX ROLL-ON EX) Apply 1 application. topically daily as needed (Back pain).     metFORMIN (GLUCOPHAGE) 1000 MG tablet TAKE ONE (1) TABLET BY MOUTH TWO (2) TIMES DAILY 180 tablet 0   nitroGLYCERIN (NITROSTAT) 0.4 MG SL tablet Place 1 tablet (0.4 mg total) under the tongue every 5 (five) minutes as needed for chest pain. 25 tablet 3   oxyCODONE-acetaminophen (PERCOCET)  10-325 MG tablet Take 1 tablet by mouth every 4 (four) hours as needed for pain. 30 tablet 0   pantoprazole (PROTONIX) 40 MG tablet Take 1 tablet (40 mg total) by mouth daily. 90 tablet 1   tamsulosin (FLOMAX) 0.4 MG CAPS capsule TAKE 1 CAPSULE (0.4 MG TOTAL) BY MOUTH DAILY. 90 capsule 1   terbinafine (LAMISIL) 250 MG tablet Take 1 tablet (250 mg total) by mouth daily. 90 tablet 0   tiotropium (SPIRIVA HANDIHALER) 18 MCG inhalation capsule PLACE ONE CAPSULE IN THE INHALER AND INHALE ONCE DAILY 30 capsule 99   traMADol (ULTRAM) 50 MG tablet Take 1 tablet (50 mg total) by mouth  every 8 (eight) hours as needed. 50 tablet 1   No facility-administered medications prior to visit.    No Known Allergies ROS neg/noncontributory except as noted HPI/below      Objective:     BP 134/70   Pulse 84   Temp 98 F (36.7 C) (Temporal)   Ht '6\' 3"'$  (1.905 m)   Wt 258 lb 6 oz (117.2 kg)   SpO2 99%   BMI 32.29 kg/m  Wt Readings from Last 3 Encounters:  07/12/21 258 lb 6 oz (117.2 kg)  07/01/21 257 lb 12.8 oz (116.9 kg)  06/10/21 255 lb (115.7 kg)    Physical Exam   Gen: WDWN NAD owm HEENT: NCAT, conjunctiva not injected, sclera nonicteric NECK:  supple, no thyromegaly, no nodes,  ABDOMEN:  BS+, soft, NTND, No HSM, no masses EXT:  no edema MSK: L ant shoulder, about acromion-approx 1 cm firm, fixed nodule.  NT NEURO: A&O x3.  CN II-XII intact.  PSYCH: normal mood. Good eye contact     Assessment & Plan:   Problem List Items Addressed This Visit   None Visit Diagnoses     Shoulder mass    -  Primary   Relevant Orders   DG Shoulder Left   DG Clavicle Left      Shoulder mass for 2 wks and getting bigger.  ?arthritis,?lymphoma recurrance,?other.  Will start w/x-ray.  Further imaging dependent on results.  Need to expidite w/u d/t hx lymphoma  No orders of the defined types were placed in this encounter.   Wellington Hampshire, MD

## 2021-07-12 NOTE — Patient Instructions (Signed)
X-ray [place]  [place] 830-5.  Closed 1230-1 lunch

## 2021-07-28 ENCOUNTER — Other Ambulatory Visit: Payer: Self-pay | Admitting: Family Medicine

## 2021-07-28 ENCOUNTER — Other Ambulatory Visit (HOSPITAL_COMMUNITY): Payer: Self-pay

## 2021-07-28 MED ORDER — TRAMADOL HCL 50 MG PO TABS
ORAL_TABLET | ORAL | 1 refills | Status: DC
  Filled 2021-07-28: qty 50, 16d supply, fill #0
  Filled 2021-08-05 – 2021-08-25 (×2): qty 50, 16d supply, fill #1

## 2021-07-28 MED ORDER — CLONAZEPAM 0.5 MG PO TABS
ORAL_TABLET | Freq: Every day | ORAL | 0 refills | Status: DC
  Filled 2021-07-28: qty 30, 30d supply, fill #0
  Filled 2021-08-05 – 2021-08-25 (×2): qty 30, 30d supply, fill #1

## 2021-07-28 NOTE — Telephone Encounter (Signed)
LAST APPOINTMENT DATE: 07/12/2021   NEXT APPOINTMENT DATE: 09/09/2021    LAST REFILL: 04/19/2021  QTY:90

## 2021-07-29 ENCOUNTER — Other Ambulatory Visit (HOSPITAL_COMMUNITY): Payer: Self-pay

## 2021-08-05 ENCOUNTER — Other Ambulatory Visit (HOSPITAL_COMMUNITY): Payer: Self-pay

## 2021-08-05 ENCOUNTER — Other Ambulatory Visit: Payer: Medicare PPO

## 2021-08-08 ENCOUNTER — Other Ambulatory Visit (HOSPITAL_COMMUNITY): Payer: Self-pay

## 2021-08-12 ENCOUNTER — Other Ambulatory Visit (HOSPITAL_COMMUNITY): Payer: Self-pay

## 2021-08-25 ENCOUNTER — Other Ambulatory Visit: Payer: Self-pay | Admitting: Family Medicine

## 2021-08-25 ENCOUNTER — Other Ambulatory Visit (HOSPITAL_COMMUNITY): Payer: Self-pay

## 2021-08-25 MED ORDER — DULOXETINE HCL 30 MG PO CPEP
ORAL_CAPSULE | ORAL | 1 refills | Status: DC
Start: 2021-08-25 — End: 2022-02-14
  Filled 2021-08-25: qty 90, 90d supply, fill #0
  Filled 2021-11-16: qty 90, 90d supply, fill #1

## 2021-08-28 ENCOUNTER — Other Ambulatory Visit: Payer: Self-pay | Admitting: Family Medicine

## 2021-08-29 ENCOUNTER — Other Ambulatory Visit: Payer: Self-pay | Admitting: *Deleted

## 2021-08-29 ENCOUNTER — Encounter: Payer: Self-pay | Admitting: Oncology

## 2021-08-29 ENCOUNTER — Encounter: Payer: Self-pay | Admitting: Family Medicine

## 2021-08-29 ENCOUNTER — Other Ambulatory Visit (HOSPITAL_COMMUNITY): Payer: Self-pay

## 2021-08-29 ENCOUNTER — Ambulatory Visit (INDEPENDENT_AMBULATORY_CARE_PROVIDER_SITE_OTHER): Payer: Medicare PPO | Admitting: Family Medicine

## 2021-08-29 VITALS — BP 137/86 | HR 77 | Temp 97.2°F | Ht 75.0 in | Wt 258.2 lb

## 2021-08-29 DIAGNOSIS — J4530 Mild persistent asthma, uncomplicated: Secondary | ICD-10-CM | POA: Diagnosis not present

## 2021-08-29 DIAGNOSIS — R739 Hyperglycemia, unspecified: Secondary | ICD-10-CM

## 2021-08-29 DIAGNOSIS — G4700 Insomnia, unspecified: Secondary | ICD-10-CM | POA: Diagnosis not present

## 2021-08-29 DIAGNOSIS — M1990 Unspecified osteoarthritis, unspecified site: Secondary | ICD-10-CM | POA: Diagnosis not present

## 2021-08-29 DIAGNOSIS — M25511 Pain in right shoulder: Secondary | ICD-10-CM | POA: Diagnosis not present

## 2021-08-29 DIAGNOSIS — I2510 Atherosclerotic heart disease of native coronary artery without angina pectoris: Secondary | ICD-10-CM

## 2021-08-29 MED ORDER — TAMSULOSIN HCL 0.4 MG PO CAPS
ORAL_CAPSULE | Freq: Every day | ORAL | 1 refills | Status: DC
  Filled 2021-08-29: qty 90, 90d supply, fill #0
  Filled 2021-11-20: qty 90, 90d supply, fill #1

## 2021-08-29 MED ORDER — METFORMIN HCL 1000 MG PO TABS
ORAL_TABLET | ORAL | 0 refills | Status: DC
  Filled 2021-08-29: qty 180, 90d supply, fill #0

## 2021-08-29 MED ORDER — TERBINAFINE HCL 250 MG PO TABS
250.0000 mg | ORAL_TABLET | Freq: Every day | ORAL | 0 refills | Status: DC
  Filled 2021-08-29: qty 90, 90d supply, fill #0

## 2021-08-29 MED ORDER — TRAZODONE HCL 50 MG PO TABS
25.0000 mg | ORAL_TABLET | Freq: Every evening | ORAL | 3 refills | Status: DC | PRN
  Filled 2021-08-29: qty 30, 30d supply, fill #0
  Filled 2021-09-23: qty 30, 30d supply, fill #1
  Filled 2021-10-26: qty 30, 30d supply, fill #2
  Filled 2021-11-20: qty 30, 30d supply, fill #3

## 2021-08-29 MED ORDER — LEVALBUTEROL TARTRATE 45 MCG/ACT IN AERO
2.0000 | INHALATION_SPRAY | Freq: Every day | RESPIRATORY_TRACT | 1 refills | Status: DC | PRN
  Filled 2021-08-29: qty 15, 30d supply, fill #0
  Filled 2021-09-05: qty 15, 25d supply, fill #0

## 2021-08-29 MED ORDER — CELECOXIB 200 MG PO CAPS
200.0000 mg | ORAL_CAPSULE | Freq: Two times a day (BID) | ORAL | 5 refills | Status: DC | PRN
  Filled 2021-08-29: qty 60, 30d supply, fill #0
  Filled 2022-01-27: qty 60, 30d supply, fill #1
  Filled 2022-03-27: qty 60, 30d supply, fill #2
  Filled 2022-06-03: qty 60, 30d supply, fill #3
  Filled 2022-07-24: qty 60, 30d supply, fill #4

## 2021-08-29 NOTE — Assessment & Plan Note (Signed)
Stable.  We will refill Xopenex.  He is also on Spiriva daily which works well.

## 2021-08-29 NOTE — Progress Notes (Signed)
   Jeffery Wood is a 66 y.o. male who presents today for an office visit.  Assessment/Plan:  Chronic Problems Addressed Today: CAD (coronary artery disease) Follows with cardiology. Recent had catheterization with stenting. He is currently on plavix and aspirin. He will be following up with cardiology  In a few weeks.   Insomnia He would like to wean off of clonazepam due to concern for side effects including cognitive impairment.  I think this is a good idea.  We discussed alternatives.  We will try trazodone 25 to 50 mg nightly.  He will take half a dose of clonazepam for the next week or so and then stop completely.  Hyperglycemia Too early to recheck A1c.  He is doing okay with current dose of glimepiride '2mg'$  with largest meal of the day and metformin '1000mg'$  twice daily.  We will recheck A1c at next office visit.  Osteoarthritis Likely contributing to his right shoulder pain.  We will be restarting his Celebrex as above.  We did have a lengthy discussion regarding safety profile of the medication and that it does have associated risk of increased adverse cardiac outcomes including heart attack.  Advised patient to use only sporadically as needed.  He understands the risks however states that he needs this medication to help perform ADLs.  He will let me know if he has any worsening shortness of breath, chest pain, or fatigue.  He is on GI protection with Protonix - low concern for gastric side effects while on celebrex.   Asthma Stable.  We will refill Xopenex.  He is also on Spiriva daily which works well.  Right shoulder pain Had recent x-ray that showed arthritis in his [facility] joint also calcific tendinopathy.  We discussed referral to sports medicine however he deferred for now.     Subjective:  HPI:  See A/p for status of chronic conditions.         Objective:  Physical Exam: BP 137/86   Pulse 77   Temp (!) 97.2 F (36.2 C) (Temporal)   Ht '6\' 3"'$  (1.905 m)   Wt 258 lb  3.2 oz (117.1 kg)   SpO2 99%   BMI 32.27 kg/m   Gen: No acute distress, resting comfortably CV: Regular rate and rhythm with no murmurs appreciated Pulm: Normal work of breathing, clear to auscultation bilaterally with no crackles, wheezes, or rhonchi Neuro: Grossly normal, moves all extremities Psych: Normal affect and thought content      Demiana Crumbley M. Jerline Pain, MD 08/29/2021 9:14 AM

## 2021-08-29 NOTE — Assessment & Plan Note (Signed)
Follows with cardiology. Recent had catheterization with stenting. He is currently on plavix and aspirin. He will be following up with cardiology  In a few weeks.

## 2021-08-29 NOTE — Assessment & Plan Note (Signed)
He would like to wean off of clonazepam due to concern for side effects including cognitive impairment.  I think this is a good idea.  We discussed alternatives.  We will try trazodone 25 to 50 mg nightly.  He will take half a dose of clonazepam for the next week or so and then stop completely.

## 2021-08-29 NOTE — Assessment & Plan Note (Addendum)
Likely contributing to his right shoulder pain.  We will be restarting his Celebrex as above.  We did have a lengthy discussion regarding safety profile of the medication and that it does have associated risk of increased adverse cardiac outcomes including heart attack.  Advised patient to use only sporadically as needed.  He understands the risks however states that he needs this medication to help perform ADLs.  He will let me know if he has any worsening shortness of breath, chest pain, or fatigue.  He is on GI protection with Protonix - low concern for gastric side effects while on celebrex.

## 2021-08-29 NOTE — Patient Instructions (Addendum)
It was very nice to see you today!  I will send in more of the Celebrex.  Please try to use this only as needed.  Prolonged use can increase your risk for heart attack.  I will refill your Xopenex.  We will try weaning off your clonazepam.  Please take half a dose of the clonazepam and half a dose of the trazodone for the next 1 to 2 weeks and then stop the clonazepam and do a full dose of the trazodone.  Send me a  message in a few weeks limit how this is working for you.  We will see back in a few months to recheck your A1c.  Please come back to see me sooner if needed.  Take care, Dr Jerline Pain  PLEASE NOTE:  If you had any lab tests please let us know if you have not heard back within a few days. You may see your results on mychart before we have a chance to review them but we will give you a call once they are reviewed by [place]. If we ordered any referrals today, please let us know if you have not heard from their office within the next week.   Please try these tips to maintain a healthy lifestyle:  Eat at least 3 REAL meals and 1-2 snacks per day.  Aim for no more than 5 hours between eating.  If you eat breakfast, please do so within one hour of getting up.   Each meal should contain half fruits/vegetables, one quarter protein, and one quarter carbs (no bigger than a computer mouse)  Cut down on sweet beverages. This includes juice, soda, and sweet tea.   Drink at least 1 glass of water with each meal and aim for at least 8 glasses per day  Exercise at least 150 minutes every week.

## 2021-08-29 NOTE — Telephone Encounter (Signed)
The alternatives can be very expensive. Would recommend trying another round of terbinafine before we make a switch to an alternative medication.

## 2021-08-29 NOTE — Telephone Encounter (Signed)
Please advise 

## 2021-08-29 NOTE — Assessment & Plan Note (Signed)
Too early to recheck A1c.  He is doing okay with current dose of glimepiride '2mg'$  with largest meal of the day and metformin '1000mg'$  twice daily.  We will recheck A1c at next office visit.

## 2021-08-29 NOTE — Assessment & Plan Note (Signed)
Had recent x-ray that showed arthritis in his [facility] joint also calcific tendinopathy.  We discussed referral to sports medicine however he deferred for now.

## 2021-09-05 ENCOUNTER — Other Ambulatory Visit (HOSPITAL_COMMUNITY): Payer: Self-pay

## 2021-09-06 ENCOUNTER — Other Ambulatory Visit: Payer: Medicare PPO

## 2021-09-06 DIAGNOSIS — E785 Hyperlipidemia, unspecified: Secondary | ICD-10-CM | POA: Diagnosis not present

## 2021-09-06 DIAGNOSIS — I2510 Atherosclerotic heart disease of native coronary artery without angina pectoris: Secondary | ICD-10-CM | POA: Diagnosis not present

## 2021-09-06 DIAGNOSIS — R5383 Other fatigue: Secondary | ICD-10-CM

## 2021-09-06 LAB — LIPID PANEL
Chol/HDL Ratio: 2.9 ratio (ref 0.0–5.0)
Cholesterol, Total: 120 mg/dL (ref 100–199)
HDL: 41 mg/dL (ref >39)
LDL Chol Calc (NIH): 65 mg/dL (ref 0–99)
Triglycerides: 64 mg/dL (ref 0–149)
VLDL Cholesterol Cal: 14 mg/dL (ref 5–40)

## 2021-09-06 LAB — COMPREHENSIVE METABOLIC PANEL
ALT: 14 IU/L (ref 0–44)
AST: 16 IU/L (ref 0–40)
Albumin/Globulin Ratio: 2.3 — ABNORMAL HIGH (ref 1.2–2.2)
Albumin: 4.3 g/dL (ref 3.9–4.9)
Alkaline Phosphatase: 105 IU/L (ref 44–121)
BUN/Creatinine Ratio: 16 (ref 10–24)
BUN: 14 mg/dL (ref 8–27)
Bilirubin Total: 0.4 mg/dL (ref 0.0–1.2)
CO2: 27 mmol/L (ref 20–29)
Calcium: 9.3 mg/dL (ref 8.6–10.2)
Chloride: 98 mmol/L (ref 96–106)
Creatinine, Ser: 0.85 mg/dL (ref 0.76–1.27)
Globulin, Total: 1.9 g/dL (ref 1.5–4.5)
Glucose: 121 mg/dL — ABNORMAL HIGH (ref 70–99)
Potassium: 4.2 mmol/L (ref 3.5–5.2)
Sodium: 139 mmol/L (ref 134–144)
Total Protein: 6.2 g/dL (ref 6.0–8.5)
eGFR: 96 mL/min/{1.73_m2} (ref >59)

## 2021-09-09 ENCOUNTER — Ambulatory Visit: Payer: Medicare PPO | Admitting: Family Medicine

## 2021-09-09 ENCOUNTER — Telehealth: Payer: Self-pay | Admitting: Oncology

## 2021-09-09 NOTE — Telephone Encounter (Signed)
Attempted to contact patient in regards to rescheduling appointment due to Dr. Benay Spice being out of office

## 2021-09-13 ENCOUNTER — Telehealth: Payer: Self-pay | Admitting: Family Medicine

## 2021-09-13 ENCOUNTER — Other Ambulatory Visit (HOSPITAL_COMMUNITY): Payer: Self-pay

## 2021-09-13 NOTE — Telephone Encounter (Signed)
Patient's wife Lattie Haw states the RX for levalbuterol [facility] HFA) 45 MCG/ACT inhaler requires PA per Pharmacy.  Caller states Patient needs an inhaler asap because Patient is almost out of his inhaler.

## 2021-09-17 ENCOUNTER — Other Ambulatory Visit (HOSPITAL_COMMUNITY): Payer: Self-pay

## 2021-09-17 NOTE — Telephone Encounter (Signed)
Pharmacy notified  Left voice message with information to patient

## 2021-09-17 NOTE — Telephone Encounter (Signed)
Approved today PA Case: [id number], Status: Approved, Coverage Starts on: 01/30/2021 12:00:00 AM, Coverage Ends on: 01/29/2022

## 2021-09-17 NOTE — Telephone Encounter (Signed)
PA was send in on 09/17/2021 Key: [id number] - PA Case ID: [id number] Status Sent to Plan today Drug Xopenex HFA 45MCG/ACT aerosol Waiting for determination

## 2021-09-19 ENCOUNTER — Other Ambulatory Visit (HOSPITAL_COMMUNITY): Payer: Self-pay

## 2021-09-20 ENCOUNTER — Ambulatory Visit: Payer: Medicare PPO | Admitting: Family Medicine

## 2021-09-20 ENCOUNTER — Other Ambulatory Visit (HOSPITAL_COMMUNITY): Payer: Self-pay

## 2021-09-20 ENCOUNTER — Encounter: Payer: Self-pay | Admitting: Family Medicine

## 2021-09-20 VITALS — BP 119/75 | HR 85 | Temp 97.6°F | Ht 75.0 in | Wt 255.0 lb

## 2021-09-20 DIAGNOSIS — J4530 Mild persistent asthma, uncomplicated: Secondary | ICD-10-CM

## 2021-09-20 DIAGNOSIS — E1159 Type 2 diabetes mellitus with other circulatory complications: Secondary | ICD-10-CM | POA: Diagnosis not present

## 2021-09-20 DIAGNOSIS — I10 Essential (primary) hypertension: Secondary | ICD-10-CM | POA: Diagnosis not present

## 2021-09-20 MED ORDER — LEVALBUTEROL TARTRATE 45 MCG/ACT IN AERO
2.0000 | INHALATION_SPRAY | Freq: Every day | RESPIRATORY_TRACT | 1 refills | Status: DC | PRN
  Filled 2021-09-20 – 2021-10-26 (×2): qty 15, 25d supply, fill #0
  Filled 2021-12-30: qty 15, 25d supply, fill #1

## 2021-09-20 MED ORDER — AMOXICILLIN-POT CLAVULANATE 875-125 MG PO TABS
1.0000 | ORAL_TABLET | Freq: Two times a day (BID) | ORAL | 0 refills | Status: DC
  Filled 2021-09-20: qty 20, 10d supply, fill #0

## 2021-09-20 MED ORDER — BENZONATATE 200 MG PO CAPS
200.0000 mg | ORAL_CAPSULE | Freq: Two times a day (BID) | ORAL | 0 refills | Status: DC | PRN
  Filled 2021-09-20: qty 20, 10d supply, fill #0

## 2021-09-20 NOTE — Progress Notes (Signed)
   Jeffery Wood is a 66 y.o. male who presents today for an office visit.  Assessment/Plan:  New/Acute Problems: Otitis Media / Sinusitis  Start Augmentin.  He can continue over-the-counter meds.  Will refill has Xopenex.  Also start Tessalon.  He has recently been tested for COVID which was negative-do not think we need to retest at this point as it would not significantly change management.  We discussed reasons to return to care.  May need referral to ENT if this persists.  Otorrhagia  Looks to be likely excoriated exostosis though difficult to fully evaluate today due to the amount of dried blood in his EAC.  He does have otitis media which could have caused some bleeding as well.  We will treat as above.  He will come back in a couple of weeks to reevaluate.  May need referral to ENT if bleeding persist   Chronic Problems Addressed Today: Asthma We will refill Xopenex today.  Is having a mild asthma flare secondary to his above URI.  Reassuring exam today.  Hypertension associated with diabetes ([name]) Blood pressure is at goal today off medications.     Subjective:  HPI:  Patient here with cough and congestion. This started about a week ago. Has also noticed pain in his left ear with some bleeding. He took a home covid test which was negative.  Nose bleeding after using a Q-tip.  No fevers or chills.  HE has had some more wheezing and difficulty breathing. Tried taking nyquil which helped.  Xopenex works well to help manage his asthma symptoms.  No reported chest pain or shortness of breath.       Objective:  Physical Exam: BP 119/75   Pulse 85   Temp 97.6 F (36.4 C) (Temporal)   Ht '6\' 3"'$  (1.905 m)   Wt 255 lb (115.7 kg)   SpO2 97%   BMI 31.87 kg/m   Gen: No acute distress, resting comfortably HEENT: Left EAC with dried blood.  Flesh-colored lump consistent with exostosis with significant amount of dried blood.  TM is erythematous.  No perforations. CV: Regular rate  and rhythm with no murmurs appreciated Pulm: Normal work of breathing, clear to auscultation bilaterally with no crackles, wheezes, or rhonchi Neuro: Grossly normal, moves all extremities Psych: Normal affect and thought content      Jeffery Wood M. Jerline Pain, MD 09/20/2021 11:54 AM

## 2021-09-20 NOTE — Assessment & Plan Note (Signed)
Blood pressure is at goal today off medications.

## 2021-09-20 NOTE — Assessment & Plan Note (Signed)
We will refill Xopenex today.  Is having a mild asthma flare secondary to his above URI.  Reassuring exam today.

## 2021-09-20 NOTE — Patient Instructions (Signed)
It was very nice to see you today!  You have an ear infection.  Please start the Augmentin.  You probably have a sinus infection as well.  Please start the cough medication.  I will refill your inhaler.  Please come back in a couple of weeks for [place] to look in year to make sure the bleeding is stopped.  Take care, Dr Jerline Pain  PLEASE NOTE:  If you had any lab tests please let us know if you have not heard back within a few days. You may see your results on mychart before we have a chance to review them but we will give you a call once they are reviewed by [place]. If we ordered any referrals today, please let us know if you have not heard from their office within the next week.   Please try these tips to maintain a healthy lifestyle:  Eat at least 3 REAL meals and 1-2 snacks per day.  Aim for no more than 5 hours between eating.  If you eat breakfast, please do so within one hour of getting up.   Each meal should contain half fruits/vegetables, one quarter protein, and one quarter carbs (no bigger than a computer mouse)  Cut down on sweet beverages. This includes juice, soda, and sweet tea.   Drink at least 1 glass of water with each meal and aim for at least 8 glasses per day  Exercise at least 150 minutes every week.

## 2021-09-21 ENCOUNTER — Other Ambulatory Visit (HOSPITAL_COMMUNITY): Payer: Self-pay

## 2021-09-23 ENCOUNTER — Encounter: Payer: Self-pay | Admitting: Family Medicine

## 2021-09-24 ENCOUNTER — Other Ambulatory Visit (HOSPITAL_COMMUNITY): Payer: Self-pay

## 2021-09-26 NOTE — Telephone Encounter (Signed)
Spoke with patient, advise to go to ED patient stated after taking Rx amoxicillin doing a bit better  Will continue taking antibiotic if symptoms worsen will go to ED. Scheduled an OV for next week for F/U

## 2021-09-27 NOTE — Telephone Encounter (Signed)
Agree - he needs to be seen if not improving or if he has any development of new symptoms  Nainika Newlun M. Jerline Pain, MD 09/27/2021 7:34 AM

## 2021-10-04 ENCOUNTER — Encounter: Payer: Self-pay | Admitting: Family Medicine

## 2021-10-04 ENCOUNTER — Ambulatory Visit (INDEPENDENT_AMBULATORY_CARE_PROVIDER_SITE_OTHER): Payer: Medicare PPO | Admitting: Family Medicine

## 2021-10-04 VITALS — BP 122/77 | HR 74 | Temp 97.6°F | Ht 75.0 in | Wt 255.6 lb

## 2021-10-04 DIAGNOSIS — E1159 Type 2 diabetes mellitus with other circulatory complications: Secondary | ICD-10-CM

## 2021-10-04 DIAGNOSIS — I152 Hypertension secondary to endocrine disorders: Secondary | ICD-10-CM | POA: Diagnosis not present

## 2021-10-04 DIAGNOSIS — R739 Hyperglycemia, unspecified: Secondary | ICD-10-CM | POA: Diagnosis not present

## 2021-10-04 DIAGNOSIS — E1165 Type 2 diabetes mellitus with hyperglycemia: Secondary | ICD-10-CM | POA: Diagnosis not present

## 2021-10-04 NOTE — Patient Instructions (Signed)
It was very nice to see you today!  Your ear looks much better.  Please let me know if your symptoms return.  I will see back in activity for your physical.  Come back sooner if needed.  Take care, Dr Jerline Pain  PLEASE NOTE:  If you had any lab tests please let us know if you have not heard back within a few days. You may see your results on mychart before we have a chance to review them but we will give you a call once they are reviewed by [place]. If we ordered any referrals today, please let us know if you have not heard from their office within the next week.   Please try these tips to maintain a healthy lifestyle:  Eat at least 3 REAL meals and 1-2 snacks per day.  Aim for no more than 5 hours between eating.  If you eat breakfast, please do so within one hour of getting up.   Each meal should contain half fruits/vegetables, one quarter protein, and one quarter carbs (no bigger than a computer mouse)  Cut down on sweet beverages. This includes juice, soda, and sweet tea.   Drink at least 1 glass of water with each meal and aim for at least 8 glasses per day  Exercise at least 150 minutes every week.

## 2021-10-04 NOTE — Assessment & Plan Note (Signed)
Come back next month for CPE and we can recheck A1c at that time.  We will continue current regimen metformin 1000 mg twice daily and glimepiride 2 mg with largest meal of the day.

## 2021-10-04 NOTE — Progress Notes (Signed)
   Jeffery Wood is a 66 y.o. male who presents today for an office visit.  Assessment/Plan:  New/Acute Problems: Otorrhagia Resolved.  Advised against Q-tip use.  No signs of TM perforation or other active sites of bleeding.  Given symptoms resolved do not need to do anything else at this point.  He will let us know if symptoms return.  Chronic Problems Addressed Today: Hypertension associated with diabetes ([place]) Blood pressure at goal today off medications.  Hyperglycemia Come back next month for CPE and we can recheck A1c at that time.  We will continue current regimen metformin 1000 mg twice daily and glimepiride 2 mg with largest meal of the day.     Subjective:  HPI:  See A/p for status of chronic conditions.   Patient is here today for otorrhagia follow up.  He was seen here 2 weeks ago with bleeding from his left ear after using a Q-tip in setting of URI symptoms.  He was also diagnosed with sinusitis and otitis media.  We started on a course of Augmentin.  Symptoms significant improved after a few days on this.  He thinks he completed about 7 or 8 days of antibiotics but lost the rest then.  Has not had any recurrence of symptoms over the last week or so.  No ear pain.  No hearing issues.       Objective:  Physical Exam: BP 122/77   Pulse 74   Temp 97.6 F (36.4 C) (Temporal)   Ht '6\' 3"'$  (1.905 m)   Wt 255 lb 9.6 oz (115.9 kg)   SpO2 100%   BMI 31.95 kg/m   Gen: No acute distress, resting comfortably HEENT: Scant amount of dried blood in left EAC.  No obvious open lesions.  TM erythematous without any perforation. CV: Regular rate and rhythm with no murmurs appreciated Pulm: Normal work of breathing, clear to auscultation bilaterally with no crackles, wheezes, or rhonchi Neuro: Grossly normal, moves all extremities Psych: Normal affect and thought content      Ely Ballen M. Jerline Pain, MD 10/04/2021 7:45 AM

## 2021-10-04 NOTE — Assessment & Plan Note (Signed)
Blood pressure at goal today off medications. 

## 2021-10-05 ENCOUNTER — Encounter: Payer: Self-pay | Admitting: Internal Medicine

## 2021-10-05 ENCOUNTER — Ambulatory Visit: Payer: Medicare PPO | Attending: Internal Medicine | Admitting: Internal Medicine

## 2021-10-05 VITALS — BP 126/74 | HR 79 | Ht 75.0 in | Wt 256.0 lb

## 2021-10-05 DIAGNOSIS — R0602 Shortness of breath: Secondary | ICD-10-CM

## 2021-10-05 NOTE — Patient Instructions (Signed)
Medication Instructions:   *If you need a refill on your cardiac medications before your next appointment, please call your pharmacy*   Lab Work:  If you have labs (blood work) drawn today and your tests are completely normal, you will receive your results only by: [place] (if you have MyChart) OR A paper copy in the mail If you have any lab test that is abnormal or we need to change your treatment, we will call you to review the results.   Testing/Procedures: Your physician has recommended that you have a pulmonary function test. Pulmonary Function Tests are a group of tests that measure how well air moves in and out of your lungs.    Follow-Up: At [facility], you and your health needs are our priority.  As part of our continuing mission to provide you with exceptional heart care, we have created designated Provider Care Teams.  These Care Teams include your primary Cardiologist (physician) and Advanced Practice Providers (APPs -  Physician Assistants and Nurse Practitioners) who all work together to provide you with the care you need, when you need it.  We recommend signing up for the patient portal called "MyChart".  Sign up information is provided on this After Visit Summary.  MyChart is used to connect with patients for Virtual Visits (Telemedicine).  Patients are able to view lab/test results, encounter notes, upcoming appointments, etc.  Non-urgent messages can be sent to your provider as well.   To learn more about what you can do with MyChart, go to [email or web address].    Your next appointment:   1 year(s)  The format for your next appointment:   In Person  Provider:   [name], MD     Other Instructions   Important Information About Sugar

## 2021-10-05 NOTE — Progress Notes (Signed)
Cardiology Office Note:    Date:  10/05/2021   ID:  BRENDAN GADSON, DOB [birth year], MRN [id number]  PCP:  [name], MD   [facility] Providers Cardiologist:  [name], MD     Referring MD: [name], MD   Chief Complaint: hospital f/u CAD s/p DES   History of Present Illness:    Jeffery Wood is a 66 y.o. male with a hx of CAD s/p PCI/DES to mid LAD, distal LAD, and first diagonal ostium, diabetes, large cell lymphoma (in remission), COPD I saw the pt in May 2023 when he was sent for Ca score of 294 74th percentile for age/sex).  He complained of intermittent chest discomfort with and without activity, worse with activity.  Also complaine of "just giving out". Reported that he thought he had a cath many years ago but no specific details given. He underewent LHC on 06/10/21 revealed 95% distal LAD lesion treated with DES, 80% mid LAD lesion treated with DES, 90% D1 lesion treated with balloon angioplasty reducing the blockage down to 40%, distal RPDA occlusion with left-to-right collaterals, EF 55 to 65% by LV gram. DAPT with aspirin and clopidogrel x6 months, plan for clopidogrel monotherapy due to diffuse disease noted in the RCA after 6 months.  He was discharged home on 06/11/2021.  The pt says for the first week after the procedure he felt good, more energy   Then he had discomfort (mild)  Not associated with exertion   when he saw Elwyn Reach in clinic in June 2023 he did not complain of DOE   He did complain of worsening fatigue      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    CAD (coronary artery disease) 2023   5v   Cancer ([place])    basal cell of right temple; carcinoids   Complication of anesthesia    hard to wake up after bronchoscopy   COPD (chronic obstructive pulmonary disease) ([place])    Diabetes ([place])    type 2   Dyspnea    GERD (gastroesophageal reflux disease)    Gout     resolved, no current problem   Headache    History of hiatal hernia 05/16/2006   small noted on EGD   Hypertension    hx   Lung mass    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[place])    last chemo 05/2017, in remission-chest, but all over   Memory loss    mild - r/t chemo, 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   Neuromuscular disorder ([place])    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   Sleep apnea    does not use anything for OSA    Past Surgical History:  Procedure Laterality Date   BACK SURGERY     L4-L5 bracket and screws   BICEPS TENDON REPAIR Left    CARPAL TUNNEL RELEASE     COLONOSCOPY     CORONARY BALLOON ANGIOPLASTY N/A 06/10/2021   Procedure: CORONARY BALLOON ANGIOPLASTY;  Surgeon: Jettie Booze, MD;  Location: [place] [facility];  Service: Cardiovascular;  Laterality: N/A;   CORONARY STENT INTERVENTION N/A 06/10/2021   Procedure: CORONARY STENT INTERVENTION;  Surgeon: Jettie Booze, MD;  Location: [place] [facility];  Service: Cardiovascular;  Laterality: N/A;   FINGER SURGERY     INTRAVASCULAR ULTRASOUND/IVUS N/A 06/10/2021   Procedure: Intravascular  Ultrasound/IVUS;  Surgeon: Jettie Booze, MD;  Location: [place] [facility];  Service: Cardiovascular;  Laterality: N/A;   KNEE ARTHROSCOPY     ACL tear   LEFT HEART CATH AND CORONARY ANGIOGRAPHY N/A 06/10/2021   Procedure: LEFT HEART CATH AND CORONARY ANGIOGRAPHY;  Surgeon: Jettie Booze, MD;  Location: [place] [facility];  Service: Cardiovascular;  Laterality: N/A;   LEFT HEART CATHETERIZATION WITH CORONARY ANGIOGRAM N/A 12/07/2010   Procedure: LEFT HEART CATHETERIZATION WITH CORONARY ANGIOGRAM;  Surgeon: Leonie Man, MD;  Location: [facility];  Service: Cardiovascular;  Laterality: N/A;   LUMBAR LAMINECTOMY/DECOMPRESSION MICRODISCECTOMY Right 12/09/2020   Procedure: MICRODISCECTOMY, RIGHT LUMBAR TWO-THREE, LUMBAR THREE-FOUR;  Surgeon: Newman Pies, MD;  Location: [place];  Service: Neurosurgery;  Laterality:  Right;   PILONIDAL CYST EXCISION     PORT-A-CATH REMOVAL N/A 11/09/2017   Procedure: REMOVAL PORT-A-CATH;  Surgeon: [name], MD;  Location: [place];  Service: General;  Laterality: N/A;  local   PORTACATH PLACEMENT Left 02/27/2017   Procedure: INSERTION PORT-A-CATH;  Surgeon: [name], MD;  Location: [facility];  Service: General;  Laterality: Left;   UPPER GASTROINTESTINAL ENDOSCOPY  05/16/2006   VIDEO BRONCHOSCOPY WITH ENDOBRONCHIAL ULTRASOUND N/A 01/24/2017   Procedure: VIDEO BRONCHOSCOPY WITH ENDOBRONCHIAL ULTRASOUND with TRANSBRONCHIAL BIOPSY;  Surgeon: Grace Isaac, MD;  Location: [place];  Service: Thoracic;  Laterality: N/A;   VIDEO MEDIASTINOSCOPY N/A 02/05/2017   Procedure: VIDEO MEDIASTINOSCOPY;  Surgeon: Grace Isaac, MD;  Location: [place];  Service: Thoracic;  Laterality: N/A;    Current Medications: Current Meds  Medication Sig   albuterol (PROVENTIL) (2.5 MG/3ML) 0.083% nebulizer solution Take 2.5 mg by nebulization every 4 (four) hours as needed for wheezing or shortness of breath.   amoxicillin-clavulanate (AUGMENTIN) 875-125 MG tablet Take 1 tablet by mouth 2 times daily.   aspirin EC 81 MG tablet Take 81 mg by mouth daily.   atorvastatin (LIPITOR) 40 MG tablet Take 1 tablet (40 mg total) by mouth daily.   augmented betamethasone dipropionate (DIPROLENE-AF) 0.05 % cream APPLY AS DIRECTED TO AFFECTED AREAS ON THE SKIN   benzonatate (TESSALON) 200 MG capsule Take 1 capsule by mouth 2 times daily as needed for cough.   celecoxib (CELEBREX) 200 MG capsule Take 1 capsule (200 mg total) by mouth 2 (two) times daily as needed.   clopidogrel (PLAVIX) 75 MG tablet Take 1 tablet (75 mg total) by mouth daily with breakfast.   cyclobenzaprine (FLEXERIL) 10 MG tablet Take 1 tablet (10 mg total) by mouth 3 (three) times daily as needed for muscle spasms.   docusate sodium (COLACE) 100 MG capsule Take 1 capsule (100 mg total) by mouth 2 (two) times  daily.   DULoxetine (CYMBALTA) 30 MG capsule TAKE 1 CAPSULE (30 MG TOTAL) BY MOUTH AT BEDTIME.   gabapentin (NEURONTIN) 300 MG capsule Take 3 capsules (900 mg total) by mouth 3 (three) times daily.   glimepiride (AMARYL) 4 MG tablet Take 1/2 tablet by mouth before biggest meal of the day.   levalbuterol (XOPENEX HFA) 45 MCG/ACT inhaler Inhale 2 puffs into the lungs daily as needed for wheezing or shortness of breath.   loratadine (CLARITIN) 10 MG tablet Take 10 mg by mouth daily.   Menthol, Topical Analgesic, (ASPERCREME MAX ROLL-ON EX) Apply 1 application. topically daily as needed (Back pain).   metFORMIN (GLUCOPHAGE) 1000 MG tablet TAKE ONE (1) TABLET BY MOUTH TWO (2) TIMES DAILY   nitroGLYCERIN (NITROSTAT) 0.4 MG SL  tablet Place 1 tablet (0.4 mg total) under the tongue every 5 (five) minutes as needed for chest pain.   oxyCODONE-acetaminophen (PERCOCET) 10-325 MG tablet Take 1 tablet by mouth every 4 (four) hours as needed for pain.   pantoprazole (PROTONIX) 40 MG tablet Take 1 tablet (40 mg total) by mouth daily.   tamsulosin (FLOMAX) 0.4 MG CAPS capsule TAKE 1 CAPSULE (0.4 MG TOTAL) BY MOUTH DAILY.   terbinafine (LAMISIL) 250 MG tablet Take 1 tablet (250 mg total) by mouth daily.   tiotropium (SPIRIVA HANDIHALER) 18 MCG inhalation capsule PLACE ONE CAPSULE IN THE INHALER AND INHALE ONCE DAILY   traMADol (ULTRAM) 50 MG tablet Take 1 tablet (50 mg total) by mouth every 8 (eight) hours as needed.   traZODone (DESYREL) 50 MG tablet Take 1/2-1 tablet by mouth at bedtime as needed for sleep.     Allergies:   Patient has no known allergies.   Social History   Socioeconomic History   Marital status: Married    Spouse name: Not on file   Number of children: 2   Years of education: Not on file   Highest education level: Not on file  Occupational History   Occupation: retired  Tobacco Use   Smoking status: Former    Packs/day: 3.00    Years: 20.00    Total pack years: 60.00    Types:  Cigarettes    Quit date: 01/31/1988    Years since quitting: 33.7   Smokeless tobacco: Never  Vaping Use   Vaping Use: Never used  Substance and Sexual Activity   Alcohol use: No   Drug use: No   Sexual activity: Yes    Partners: Female    Birth control/protection: None  Other Topics Concern   Not on file  Social History Narrative   1 grand and 1 due 08/2021   Social Determinants of Health   Financial Resource Strain: Not on file  Food Insecurity: Not on file  Transportation Needs: Not on file  Physical Activity: Not on file  Stress: Not on file  Social Connections: Not on file     Family History: The patient's family history includes Asthma in his mother; Emphysema in his mother; Heart disease in his father.  ROS:   Please see the history of present illness.    + fatigue + chest pain All other systems reviewed and are negative.  Labs/Other Studies Reviewed:    The following studies were reviewed today:  LHC 06/10/21    Dist LAD lesion is 95% stenosed.  A drug-eluting stent was successfully placed using a STENT ONYX FRONTIER 2.0X30, postdilated to 2.25 mm.   Post intervention, there is a 0% residual stenosis.   Mid LAD lesion is 80% stenosed.  A drug-eluting stent was successfully placed using a STENT ONYX FRONTIER 3.5X15, postdilated to 3.75 mm and optimized with intravascular ultrasound.   Post intervention, there is a 0% residual stenosis.   1st Diag lesion is 90% stenosed.  Balloon angioplasty was performed using a BALLN SAPPHIRE 2.0X10.   Post intervention, there is a 40% residual stenosis.   distal RPDA lesion is 100% stenosed.  Left to right collaterals.   The left ventricular systolic function is normal.   LV end diastolic pressure is normal.   The left ventricular ejection fraction is 55-65% by visual estimate.   There is no aortic valve stenosis.   High bifurcation of right radial from brachial artery.  Very small radial which will not accept a  6 French  sheath.  4 French catheters did fit but intervention was done from the right femoral artery.   In the future, if cath is considered, could try right ulnar artery if radial remains patent.   Successful PCI of the mid LAD, distal LAD and first diagonal ostium.  Continue dual antiplatelet therapy for at least 6 months.  After 6 months, would consider clopidogrel monotherapy due to diffuse disease noted in the RCA.    CT Cardiac Score 05/19/21  Coronary arteries: Normal origins.   Coronary Calcium Score:   Left main: 64   Left anterior descending artery: 223   Left circumflex artery: 1   Right coronary artery: 6   Total: 294   Percentile: 74th   Pericardium: Normal.   Ascending Aorta: Dilated ascending aorta measuring 34m   [facility]cardiac: [facility]   IMPRESSION: 1. Coronary calcium score of 294. This was 74th percentile for age-, race-, and sex-matched controls.   2. 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 Dilated main pulmonary artery measuring 3081mEcho 02/08/2017    - Left ventricle: The cavity size was normal. There was mild    concentric hypertrophy. Systolic function was normal. The    estimated ejection fraction was in the range of 60% to 65%.    Although no diagnostic regional wall motion abnormality was    identified, this possibility cannot be completely excluded on the    basis of this study. Doppler parameters are consistent with    abnormal left ventricular relaxation (grade 1 diastolic    dysfunction).  - Ventricular septum: Septal motion showed &quot;bounce&quot;. These  changes   are consistent with RV-LV interaction.  - Aortic valve: Mildly calcified annulus. Trileaflet; normal    thickness leaflets.   Complications:  Echo was requested to be completed quickly for  chemo and port access.   Recent Labs: 06/07/2021: TSH 0.947 06/11/2021: Hemoglobin 14.3; Platelets 226 09/06/2021: ALT 14; BUN 14; Creatinine, Ser  0.85; Potassium 4.2; Sodium 139  Recent Lipid Panel    Component Value Date/Time   CHOL 120 09/06/2021 0728   TRIG 64 09/06/2021 0728   HDL 41 09/06/2021 0728   CHOLHDL 2.9 09/06/2021 0728   CHOLHDL 5 11/08/2020 0840   VLDL 19.4 11/08/2020 0840   LDLCALC 65 09/06/2021 0728   LDLCALC 119 (H) 12/16/2019 0847     Risk Assessment/Calculations:       Physical Exam:    VS:  BP 126/74   Pulse 79   Ht '6\' 3"'$  (1.905 m)   Wt 256 lb (116.1 kg)   SpO2 98%   BMI 32.00 kg/m     Wt Readings from Last 3 Encounters:  10/05/21 256 lb (116.1 kg)  10/04/21 255 lb 9.6 oz (115.9 kg)  09/20/21 255 lb (115.7 kg)     GEN:  Obese [age] in no acute distress HEENT: Normal NECK: No JVD; No carotid bruits LYMPHATICS: No lymphadenopathy CARDIAC: RRR, no murmurs   RESPIRATORY:  Clear to auscultation without rales,  ABDOMEN: Soft, non-tender, non-distended MUSCULOSKELETAL:  No edema; No deformity  SKIN: Warm and dry NEUROLOGIC:  Alert and oriented x 3 PSYCHIATRIC:  Normal affect   EKG:  EKG  is not  ordered today.    Diagnoses:    No diagnosis found.  Assessment and Plan:     CAD s/p DES:   Pt with severe diffuse CAD.  He underwent  PCI/DES to the distal LAD, mid LAD, D1.  The distal R PDA is occluded with L to R collaterals   LVEF is normal      The pt says initially he did very well with increased energy, his breathing was better    He says it was remarkable    Then a couple weeks after procedure he started getting fatigued again, SOB   Note sme mild chest discomfort     He says he still doesn't have energy    Concerning is the fact that he felt good for a short bit then worse.  I I do not think he had a stent closure which would be the problem expected at this relatively early time post  Stent.   He does still have residual disease.  I would keep on same meds for now     With hx of COPD (on inhalers) I would recomm PFTs   He has never had.  Objectify lung function I will review cath  images with interventional service    Hyperlipidemia      In early Aug 2023 LDL was 65  HDL 41  Trig 64.     Keep on current regimen  3  COPD   PFTs ordered      F/U based on review of test results     Medication Adjustments/Labs and Tests Ordered: Current medicines are reviewed at length with the patient today.  Concerns regarding medicines are outlined above.  No orders of the defined types were placed in this encounter.  No orders of the defined types were placed in this encounter.   There are no Patient Instructions on file for this visit.   Signed, [name], MD  10/05/2021 10:11 AM    [place] =

## 2021-10-11 ENCOUNTER — Ambulatory Visit: Payer: Medicare PPO | Admitting: Oncology

## 2021-10-26 ENCOUNTER — Other Ambulatory Visit (HOSPITAL_COMMUNITY): Payer: Self-pay

## 2021-10-26 MED ORDER — TRAMADOL HCL 50 MG PO TABS
50.0000 mg | ORAL_TABLET | Freq: Three times a day (TID) | ORAL | 1 refills | Status: DC | PRN
  Filled 2021-10-26: qty 50, 17d supply, fill #0
  Filled 2021-12-30: qty 50, 17d supply, fill #1

## 2021-11-02 ENCOUNTER — Inpatient Hospital Stay: Payer: Medicare PPO | Attending: Oncology | Admitting: Oncology

## 2021-11-02 VITALS — BP 141/86 | HR 81 | Temp 98.1°F | Resp 18 | Ht 75.0 in | Wt 253.0 lb

## 2021-11-02 DIAGNOSIS — M109 Gout, unspecified: Secondary | ICD-10-CM | POA: Insufficient documentation

## 2021-11-02 DIAGNOSIS — Z87891 Personal history of nicotine dependence: Secondary | ICD-10-CM | POA: Insufficient documentation

## 2021-11-02 DIAGNOSIS — I2510 Atherosclerotic heart disease of native coronary artery without angina pectoris: Secondary | ICD-10-CM | POA: Insufficient documentation

## 2021-11-02 DIAGNOSIS — K5903 Drug induced constipation: Secondary | ICD-10-CM | POA: Insufficient documentation

## 2021-11-02 DIAGNOSIS — C8529 Mediastinal (thymic) large B-cell lymphoma, extranodal and solid organ sites: Secondary | ICD-10-CM | POA: Insufficient documentation

## 2021-11-02 DIAGNOSIS — Z79899 Other long term (current) drug therapy: Secondary | ICD-10-CM | POA: Insufficient documentation

## 2021-11-02 DIAGNOSIS — C8330 Diffuse large B-cell lymphoma, unspecified site: Secondary | ICD-10-CM | POA: Diagnosis not present

## 2021-11-02 DIAGNOSIS — G8929 Other chronic pain: Secondary | ICD-10-CM | POA: Diagnosis not present

## 2021-11-02 DIAGNOSIS — G629 Polyneuropathy, unspecified: Secondary | ICD-10-CM | POA: Insufficient documentation

## 2021-11-02 DIAGNOSIS — J4489 Other specified chronic obstructive pulmonary disease: Secondary | ICD-10-CM | POA: Insufficient documentation

## 2021-11-02 DIAGNOSIS — M545 Low back pain, unspecified: Secondary | ICD-10-CM | POA: Diagnosis not present

## 2021-11-02 DIAGNOSIS — E119 Type 2 diabetes mellitus without complications: Secondary | ICD-10-CM | POA: Diagnosis not present

## 2021-11-02 DIAGNOSIS — R61 Generalized hyperhidrosis: Secondary | ICD-10-CM | POA: Diagnosis not present

## 2021-11-02 NOTE — Progress Notes (Signed)
[facility] OFFICE PROGRESS NOTE   Diagnosis: Non-Hodgkin's lymphoma  INTERVAL HISTORY:   [name] returns as scheduled.  No change in the right submandibular nodule.  He reports increased discomfort at the right shoulder recently.  He injured the shoulder while working in his shop.  No fever.  Occasional night sweats.  Good appetite. He underwent a cardiac catheterization in May and was found to have multivessel CAD treated with an angioplasty and stents.  He continues to have exertional dyspnea.  He has persistent neuropathy symptoms in the feet.  Objective:  Vital signs in last 24 hours:  Blood pressure (!) 141/86, pulse 81, temperature 98.1 F (36.7 C), temperature source Oral, resp. rate 18, height 6' 3"  (1.905 m), weight 253 lb (114.8 kg), SpO2 98 %.    HEENT: 1.5 cm mobile nodular lesion in the right submandibular area Lymphatics: No cervical, supraclavicular, axillary, or inguinal nodes Resp: Lungs clear bilaterally Cardio: Regular rate and rhythm GI: No hepatosplenomegaly Vascular: No leg edema Musculoskeletal: Examination of the right upper arm and shoulder is unremarkable   Lab Results:  Lab Results  Component Value Date   WBC 8.6 06/11/2021   HGB 14.3 06/11/2021   HCT 43.8 06/11/2021   MCV 90.3 06/11/2021   PLT 226 06/11/2021   NEUTROABS 4.2 12/02/2018    CMP  Lab Results  Component Value Date   NA 139 09/06/2021   K 4.2 09/06/2021   CL 98 09/06/2021   CO2 27 09/06/2021   GLUCOSE 121 (H) 09/06/2021   BUN 14 09/06/2021   CREATININE 0.85 09/06/2021   CALCIUM 9.3 09/06/2021   PROT 6.2 09/06/2021   ALBUMIN 4.3 09/06/2021   AST 16 09/06/2021   ALT 14 09/06/2021   ALKPHOS 105 09/06/2021   BILITOT 0.4 09/06/2021   GFRNONAA >60 06/11/2021   GFRAA >60 01/18/2018     Medications: I have reviewed the patient's current medications.   Assessment/Plan: High-grade B-cell non-Hodgkin's lymphoma, molecular studies pending, clinical stage  IV, IPI- low intermediate risk, FISH panel revealed an [phone] fusion (MYC/IgH), negative for BCL 6 and BCL-2 Chest x-ray 01/15/2017 - medial right upper lobe/perihilar mass.   Chest CT 01/19/2017 - mass constricting the superior vena cava, subcarinal adenopathy, right suprahilar/mediastinal mass.   01/24/2017 status post bronchoscopy with transbronchial biopsy of level 7 node and biopsy of a right upper lobe lung mass by Dr. Servando Snare.  There was compression of the right mainstem bronchus.  A mass was noted in the right upper lobe.  Mediastinal lymph nodes were seen on EBUS.  Multiple biopsies of a level 7 node were obtained.  Brushings and a biopsy of the right upper lobe mass were obtained.  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  Final pathology was nondiagnostic.   PET scan on 02/02/2017 showed hypermetabolism corresponding to the right sided mediastinal mass, progressive since the CT 01/19/2017.  Metastatic disease/lymphoma within the bones, pericardium, stomach, bowel and abdominopelvic nodal stations.   Brain MRI 02/03/2017 showed no evidence of intracranial metastases or acute abnormality.   02/05/2017 status post mediastinoscopy.   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  Molecular studies are pending. Rituximab 02/08/2017 Cycle 1 CHOP 02/09/2017 Cycle 1 R-EPOCH 03/02/2017 Cycle 2 R-EPOCH 03/23/2017 Prophylactic intrathecal methotrexate 03/27/2017 Restaging PET scan 04/11/2017-significant decrease in FDG uptake associated with the chest, abdomen and pelvis and axial and proximal appendicular skeleton.  No new foci of abnormal radiotracer uptake identified. Cycle 3 R-EPOCH 04/13/2017 with  intrathecal methotrexate prophylaxis Cycle 4 R-EPOCH 05/04/2017 with intrathecal methotrexate prophylaxis Cycle 5 R- EPOCH 05/25/2017 with intrathecal methotrexate prophylaxis   Chest/upper back and right arm pain secondary to #1- resolved. Exertional dyspnea secondary  to #1 and COPD.  Improved. 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     Disposition: Jeffery Wood remains in clinical remission from non-Hodgkin's lymphoma.  He would like to continue follow-up at the Cancer center.  He will return for an office visit in 6 months.  He will continue follow-up with radiology for management of CAD and evaluation of dyspnea.  He is being scheduled for PFTs.  Betsy Coder, MD  11/02/2021  9:10 AM

## 2021-11-04 ENCOUNTER — Other Ambulatory Visit (HOSPITAL_COMMUNITY): Payer: Self-pay

## 2021-11-10 ENCOUNTER — Ambulatory Visit (INDEPENDENT_AMBULATORY_CARE_PROVIDER_SITE_OTHER): Payer: Medicare PPO | Admitting: Family Medicine

## 2021-11-10 ENCOUNTER — Encounter: Payer: Self-pay | Admitting: Family Medicine

## 2021-11-10 ENCOUNTER — Encounter: Payer: Self-pay | Admitting: Oncology

## 2021-11-10 VITALS — BP 131/86 | HR 80 | Temp 97.7°F | Ht 75.0 in | Wt 252.0 lb

## 2021-11-10 DIAGNOSIS — J4530 Mild persistent asthma, uncomplicated: Secondary | ICD-10-CM

## 2021-11-10 DIAGNOSIS — Z125 Encounter for screening for malignant neoplasm of prostate: Secondary | ICD-10-CM

## 2021-11-10 DIAGNOSIS — M2010 Hallux valgus (acquired), unspecified foot: Secondary | ICD-10-CM | POA: Diagnosis not present

## 2021-11-10 DIAGNOSIS — Z0001 Encounter for general adult medical examination with abnormal findings: Secondary | ICD-10-CM | POA: Diagnosis not present

## 2021-11-10 DIAGNOSIS — Z23 Encounter for immunization: Secondary | ICD-10-CM | POA: Diagnosis not present

## 2021-11-10 DIAGNOSIS — I152 Hypertension secondary to endocrine disorders: Secondary | ICD-10-CM | POA: Diagnosis not present

## 2021-11-10 DIAGNOSIS — E1159 Type 2 diabetes mellitus with other circulatory complications: Secondary | ICD-10-CM

## 2021-11-10 DIAGNOSIS — R739 Hyperglycemia, unspecified: Secondary | ICD-10-CM | POA: Diagnosis not present

## 2021-11-10 DIAGNOSIS — E785 Hyperlipidemia, unspecified: Secondary | ICD-10-CM | POA: Diagnosis not present

## 2021-11-10 LAB — COMPREHENSIVE METABOLIC PANEL
ALT: 13 U/L (ref 0–53)
AST: 14 U/L (ref 0–37)
Albumin: 4.2 g/dL (ref 3.5–5.2)
Alkaline Phosphatase: 86 U/L (ref 39–117)
BUN: 13 mg/dL (ref 6–23)
CO2: 28 mEq/L (ref 19–32)
Calcium: 9.3 mg/dL (ref 8.4–10.5)
Chloride: 99 mEq/L (ref 96–112)
Creatinine, Ser: 0.71 mg/dL (ref 0.40–1.50)
GFR: 96.1 mL/min (ref >60.00)
Glucose, Bld: 118 mg/dL — ABNORMAL HIGH (ref 70–99)
Potassium: 4.4 mEq/L (ref 3.5–5.1)
Sodium: 136 mEq/L (ref 135–145)
Total Bilirubin: 0.8 mg/dL (ref 0.2–1.2)
Total Protein: 6.7 g/dL (ref 6.0–8.3)

## 2021-11-10 LAB — CBC
HCT: 44.6 % (ref 39.0–52.0)
Hemoglobin: 15 g/dL (ref 13.0–17.0)
MCHC: 33.6 g/dL (ref 30.0–36.0)
MCV: 89.3 fl (ref 78.0–100.0)
Platelets: 219 10*3/uL (ref 150.0–400.0)
RBC: 4.99 Mil/uL (ref 4.22–5.81)
RDW: 13.8 % (ref 11.5–15.5)
WBC: 7.6 10*3/uL (ref 4.0–10.5)

## 2021-11-10 LAB — LIPID PANEL
Cholesterol: 110 mg/dL (ref 0–200)
HDL: 38.7 mg/dL — ABNORMAL LOW (ref >39.00)
LDL Cholesterol: 58 mg/dL (ref 0–99)
NonHDL: 71.69
Total CHOL/HDL Ratio: 3
Triglycerides: 66 mg/dL (ref 0.0–149.0)
VLDL: 13.2 mg/dL (ref 0.0–40.0)

## 2021-11-10 LAB — PSA: PSA: 3.31 ng/mL (ref 0.10–4.00)

## 2021-11-10 LAB — HEMOGLOBIN A1C: Hgb A1c MFr Bld: 5.9 % (ref 4.6–6.5)

## 2021-11-10 LAB — TSH: TSH: 1.09 u[IU]/mL (ref 0.35–5.50)

## 2021-11-10 NOTE — Assessment & Plan Note (Signed)
Symptoms have continued to progress.  We will refer to podiatry today.

## 2021-11-10 NOTE — Assessment & Plan Note (Signed)
Blood pressure at goal today off antihypertensives.

## 2021-11-10 NOTE — Assessment & Plan Note (Signed)
Check A1c.  He is on metformin 1000 mg twice daily and Amaryl 2 mg with largest meal the day.  Discussed lifestyle modifications.

## 2021-11-10 NOTE — Assessment & Plan Note (Signed)
On Spiriva and Xopenex.

## 2021-11-10 NOTE — Patient Instructions (Signed)
It was very nice to see you today!  We will give your flu shot today.  We will check blood work.  I will refer you to the podiatrist for the bunion on your foot.  We will probably see back in 6 months depending on results of your blood work.  Please come back to see me sooner if needed.  Take care, Dr Jerline Pain  PLEASE NOTE:  If you had any lab tests please let us know if you have not heard back within a few days. You may see your results on mychart before we have a chance to review them but we will give you a call once they are reviewed by [place]. If we ordered any referrals today, please let us know if you have not heard from their office within the next week.   Please try these tips to maintain a healthy lifestyle:  Eat at least 3 REAL meals and 1-2 snacks per day.  Aim for no more than 5 hours between eating.  If you eat breakfast, please do so within one hour of getting up.   Each meal should contain half fruits/vegetables, one quarter protein, and one quarter carbs (no bigger than a computer mouse)  Cut down on sweet beverages. This includes juice, soda, and sweet tea.   Drink at least 1 glass of water with each meal and aim for at least 8 glasses per day  Exercise at least 150 minutes every week.    Preventive Care 6 Years and Older, Male Preventive care refers to lifestyle choices and visits with your health care provider that can promote health and wellness. Preventive care visits are also called wellness exams. What can I expect for my preventive care visit? Counseling During your preventive care visit, your health care provider may ask about your: Medical history, including: Past medical problems. Family medical history. History of falls. Current health, including: Emotional well-being. Home life and relationship well-being. Sexual activity. Memory and ability to understand (cognition). Lifestyle, including: Alcohol, nicotine or tobacco, and drug use. Access to  firearms. Diet, exercise, and sleep habits. Work and work Statistician. Sunscreen use. Safety issues such as seatbelt and bike helmet use. Physical exam Your health care provider will check your: Height and weight. These may be used to calculate your BMI (body mass index). BMI is a measurement that tells if you are at a healthy weight. Waist circumference. This measures the distance around your waistline. This measurement also tells if you are at a healthy weight and may help predict your risk of certain diseases, such as type 2 diabetes and high blood pressure. Heart rate and blood pressure. Body temperature. Skin for abnormal spots. What immunizations do I need?  Vaccines are usually given at various ages, according to a schedule. Your health care provider will recommend vaccines for you based on your age, medical history, and lifestyle or other factors, such as travel or where you work. What tests do I need? Screening Your health care provider may recommend screening tests for certain conditions. This may include: Lipid and cholesterol levels. Diabetes screening. This is done by checking your blood sugar (glucose) after you have not eaten for a while (fasting). Hepatitis C test. Hepatitis B test. HIV (human immunodeficiency virus) test. STI (sexually transmitted infection) testing, if you are at risk. Lung cancer screening. Colorectal cancer screening. Prostate cancer screening. Abdominal aortic aneurysm (AAA) screening. You may need this if you are a current or former smoker. Talk with your health care provider  about your test results, treatment options, and if necessary, the need for more tests. Follow these instructions at home: Eating and drinking  Eat a diet that includes fresh fruits and vegetables, whole grains, lean protein, and low-fat dairy products. Limit your intake of foods with high amounts of sugar, saturated fats, and salt. Take vitamin and mineral supplements as  recommended by your health care provider. Do not drink alcohol if your health care provider tells you not to drink. If you drink alcohol: Limit how much you have to 0-2 drinks a day. Know how much alcohol is in your drink. In the [place] one drink equals one 12 oz bottle of beer (355 mL), one 5 oz glass of wine (148 mL), or one 1 oz glass of hard liquor (44 mL). Lifestyle Brush your teeth every morning and night with fluoride toothpaste. Floss one time each day. Exercise for at least 30 minutes 5 or more days each week. Do not use any products that contain nicotine or tobacco. These products include cigarettes, chewing tobacco, and vaping devices, such as e-cigarettes. If you need help quitting, ask your health care provider. Do not use drugs. If you are sexually active, practice safe sex. Use a condom or other form of protection to prevent STIs. Take aspirin only as told by your health care provider. Make sure that you understand how much to take and what form to take. Work with your health care provider to find out whether it is safe and beneficial for you to take aspirin daily. Ask your health care provider if you need to take a cholesterol-lowering medicine (statin). Find healthy ways to manage stress, such as: Meditation, yoga, or listening to music. Journaling. Talking to a trusted person. Spending time with friends and family. Safety Always wear your seat belt while driving or riding in a vehicle. Do not drive: If you have been drinking alcohol. Do not ride with someone who has been drinking. When you are tired or distracted. While texting. If you have been using any mind-altering substances or drugs. Wear a helmet and other protective equipment during sports activities. If you have firearms in your house, make sure you follow all gun safety procedures. Minimize exposure to UV radiation to reduce your risk of skin cancer. What's next? Visit your health care provider once a year for  an annual wellness visit. Ask your health care provider how often you should have your eyes and teeth checked. Stay up to date on all vaccines. This information is not intended to replace advice given to you by your health care provider. Make sure you discuss any questions you have with your health care provider. Document Revised: 07/14/2020 Document Reviewed: 07/14/2020 Elsevier Patient Education  [place].

## 2021-11-10 NOTE — Assessment & Plan Note (Signed)
Check lipids.  He is on Lipitor 40 mg daily. 

## 2021-11-10 NOTE — Progress Notes (Signed)
Chief Complaint:  Jeffery Wood is a 66 y.o. male who presents today for his annual comprehensive physical exam.    Assessment/Plan:  Chronic Problems Addressed Today: Dyslipidemia Check lipids.  He is on Lipitor 40 mg daily.  Hallux valgus Symptoms have continued to progress.  We will refer to podiatry today.  Hyperglycemia Check A1c.  He is on metformin 1000 mg twice daily and Amaryl 2 mg with largest meal the day.  Discussed lifestyle modifications.  Asthma On Spiriva and Xopenex.  Hypertension associated with diabetes ([place]) Blood pressure at goal today off antihypertensives.  Preventative Healthcare: Flu shot given.  Check labs.   Patient Counseling(The following topics were reviewed and/or handout was given):  -Nutrition: Stressed importance of moderation in sodium/caffeine intake, saturated fat and cholesterol, caloric balance, sufficient intake of fresh fruits, vegetables, and fiber.  -Stressed the importance of regular exercise.   -Substance Abuse: Discussed cessation/primary prevention of tobacco, alcohol, or other drug use; driving or other dangerous activities under the influence; availability of treatment for abuse.   -Injury prevention: Discussed safety belts, safety helmets, smoke detector, smoking near bedding or upholstery.   -Sexuality: Discussed sexually transmitted diseases, partner selection, use of condoms, avoidance of unintended pregnancy and contraceptive alternatives.   -Dental health: Discussed importance of regular tooth brushing, flossing, and dental visits.  -Health maintenance and immunizations reviewed. Please refer to Health maintenance section.  Return to care in 1 year for next preventative visit.     Subjective:  HPI:  He has no acute complaints today.   Lifestyle Diet: Trying to get plenty of fruits and vegetables;  Exercise: Limited due to shortness of breath.      11/10/2021    7:48 AM  Depression screen PHQ 2/9  Decreased  Interest 0  Down, Depressed, Hopeless 0  PHQ - 2 Score 0   Health Maintenance Due  Topic Date Due   FOOT EXAM  Never done   Diabetic kidney evaluation - Urine ACR  Never done     ROS: Per HPI, otherwise a complete review of systems was negative.   PMH:  The following were reviewed and entered/updated in epic: 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    CAD (coronary artery disease) 2023   5v   Cancer ([place])    basal cell of right temple; carcinoids   Complication of anesthesia    hard to wake up after bronchoscopy   COPD (chronic obstructive pulmonary disease) (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   Sleep apnea    does not use anything for OSA   Patient Active Problem List   Diagnosis Date Noted   Right shoulder pain 08/29/2021   CAD (coronary artery disease) 06/10/2021   Onychomycosis 06/09/2021   Microscopic hematuria 04/04/2021   Dyslipidemia  11/08/2020   Hallux valgus 09/07/2020   Insomnia 12/16/2019   Spondylolisthesis of lumbar region 01/17/2018   Hypertension associated with diabetes ([place]) 11/29/2017   Asthma 11/29/2017   BPH (benign prostatic hyperplasia) 11/29/2017   Chronic low back pain 11/29/2017   Osteoarthritis 11/29/2017   Psoriasis 11/29/2017   GERD (gastroesophageal reflux disease) 11/29/2017   Neuropathic pain 04/14/2017   Constipation 03/04/2017   Lymphoma, 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   NHL (non-Hodgkin's lymphoma) ([place]) 02/07/2017   OSA  (obstructive sleep apnea) 07/22/2013   Hyperglycemia 12/07/2010   Past Surgical History:  Procedure Laterality Date   BACK SURGERY     L4-L5 bracket and screws   BICEPS TENDON REPAIR Left    CARPAL TUNNEL RELEASE     COLONOSCOPY     CORONARY BALLOON ANGIOPLASTY N/A 06/10/2021   Procedure: CORONARY BALLOON ANGIOPLASTY;  Surgeon: Jettie Booze, MD;  Location: [facility];  Service: Cardiovascular;  Laterality: N/A;   CORONARY STENT INTERVENTION N/A 06/10/2021   Procedure: CORONARY STENT INTERVENTION;  Surgeon: Jettie Booze, MD;  Location: [facility];  Service: Cardiovascular;  Laterality: N/A;   FINGER SURGERY     INTRAVASCULAR ULTRASOUND/IVUS N/A 06/10/2021   Procedure: Intravascular Ultrasound/IVUS;  Surgeon: Jettie Booze, MD;  Location: [facility];  Service: Cardiovascular;  Laterality: N/A;   KNEE ARTHROSCOPY     ACL tear   LEFT HEART CATH AND CORONARY ANGIOGRAPHY N/A 06/10/2021   Procedure: LEFT HEART CATH AND CORONARY ANGIOGRAPHY;  Surgeon: Jettie Booze, MD;  Location: [facility];  Service: Cardiovascular;  Laterality: N/A;   LEFT HEART CATHETERIZATION WITH CORONARY ANGIOGRAM N/A 12/07/2010   Procedure: LEFT HEART CATHETERIZATION WITH CORONARY ANGIOGRAM;  Surgeon: Leonie Man, MD;  Location: [facility];  Service: Cardiovascular;  Laterality: N/A;   LUMBAR LAMINECTOMY/DECOMPRESSION MICRODISCECTOMY Right 12/09/2020   Procedure: MICRODISCECTOMY, RIGHT LUMBAR TWO-THREE, LUMBAR THREE-FOUR;  Surgeon: Newman Pies, MD;  Location: [facility];  Service: Neurosurgery;  Laterality: Right;   PILONIDAL CYST EXCISION     PORT-A-CATH REMOVAL N/A 11/09/2017   Procedure: REMOVAL PORT-A-CATH;  Surgeon: [name], MD;  Location: [facility];  Service: General;  Laterality: N/A;  local   PORTACATH PLACEMENT Left 02/27/2017   Procedure: INSERTION PORT-A-CATH;  Surgeon: [name], MD;  Location: [facility];  Service:  General;  Laterality: Left;   UPPER GASTROINTESTINAL ENDOSCOPY  05/16/2006   VIDEO BRONCHOSCOPY WITH ENDOBRONCHIAL ULTRASOUND N/A 01/24/2017   Procedure: VIDEO BRONCHOSCOPY WITH ENDOBRONCHIAL ULTRASOUND with TRANSBRONCHIAL BIOPSY;  Surgeon: Grace Isaac, MD;  Location: [facility];  Service: Thoracic;  Laterality: N/A;   VIDEO MEDIASTINOSCOPY N/A 02/05/2017   Procedure: VIDEO MEDIASTINOSCOPY;  Surgeon: Grace Isaac, MD;  Location: [facility];  Service: Thoracic;  Laterality: N/A;    Family History  Problem Relation Age of Onset   Emphysema Mother    Asthma Mother    Heart disease Father     Medications- reviewed and updated Current Outpatient Medications  Medication Sig Dispense Refill   albuterol (PROVENTIL) (2.5 MG/3ML) 0.083% nebulizer solution Take 2.5 mg by nebulization every 4 (four) hours as needed for wheezing or shortness of breath.     aspirin EC 81 MG tablet Take 81 mg by mouth daily.     atorvastatin (LIPITOR) 40 MG tablet Take 1 tablet (40 mg total) by mouth daily. 90 tablet 3   augmented betamethasone dipropionate (DIPROLENE-AF) 0.05 % cream APPLY AS DIRECTED TO AFFECTED AREAS ON THE SKIN  50 g 99   benzonatate (TESSALON) 200 MG capsule Take 1 capsule by mouth 2 times daily as needed for cough. 20 capsule 0   celecoxib (CELEBREX) 200 MG capsule Take 1 capsule (200 mg total) by mouth 2 (two) times daily as needed. 60 capsule 5   clopidogrel (PLAVIX) 75 MG tablet Take 1 tablet (75 mg total) by mouth daily with breakfast. 90 tablet 3   cyclobenzaprine (FLEXERIL) 10 MG tablet Take 1 tablet (10 mg total) by mouth 3 (three) times daily as needed for muscle spasms. 30 tablet 1   docusate sodium (COLACE) 100 MG capsule Take 1 capsule (100 mg total) by mouth 2 (two) times daily. 10 capsule 0   DULoxetine (CYMBALTA) 30 MG capsule TAKE 1 CAPSULE (30 MG TOTAL) BY MOUTH AT BEDTIME. 90 capsule 1   gabapentin (NEURONTIN) 300 MG capsule Take 3 capsules (900 mg total) by mouth 3 (three)  times daily. 810 capsule 3   glimepiride (AMARYL) 4 MG tablet Take 1/2 tablet by mouth before biggest meal of the day. 60 tablet 1   levalbuterol (XOPENEX HFA) 45 MCG/ACT inhaler Inhale 2 puffs into the lungs daily as needed for wheezing or shortness of breath. 15 g 1   loratadine (CLARITIN) 10 MG tablet Take 10 mg by mouth daily.     metFORMIN (GLUCOPHAGE) 1000 MG tablet TAKE ONE (1) TABLET BY MOUTH TWO (2) TIMES DAILY 180 tablet 0   nitroGLYCERIN (NITROSTAT) 0.4 MG SL tablet Place 1 tablet (0.4 mg total) under the tongue every 5 (five) minutes as needed for chest pain. 25 tablet 3   oxyCODONE-acetaminophen (PERCOCET) 10-325 MG tablet Take 1 tablet by mouth every 4 (four) hours as needed for pain. 30 tablet 0   pantoprazole (PROTONIX) 40 MG tablet Take 1 tablet (40 mg total) by mouth daily. 90 tablet 1   tamsulosin (FLOMAX) 0.4 MG CAPS capsule TAKE 1 CAPSULE (0.4 MG TOTAL) BY MOUTH DAILY. 90 capsule 1   terbinafine (LAMISIL) 250 MG tablet Take 1 tablet (250 mg total) by mouth daily. 90 tablet 0   tiotropium (SPIRIVA HANDIHALER) 18 MCG inhalation capsule PLACE ONE CAPSULE IN THE INHALER AND INHALE ONCE DAILY 30 capsule 99   traMADol (ULTRAM) 50 MG tablet Take 1 tablet (50 mg total) by mouth every 8 (eight) hours as needed. 50 tablet 1   traZODone (DESYREL) 50 MG tablet Take 1/2-1 tablet by mouth at bedtime as needed for sleep. 30 tablet 3   No current facility-administered medications for this visit.    Allergies-reviewed and updated No Known Allergies  Social History   Socioeconomic History   Marital status: Married    Spouse name: Not on file   Number of children: 2   Years of education: Not on file   Highest education level: Not on file  Occupational History   Occupation: retired  Tobacco Use   Smoking status: Former    Packs/day: 3.00    Years: 20.00    Total pack years: 60.00    Types: Cigarettes    Quit date: 01/31/1988    Years since quitting: 33.8   Smokeless tobacco:  Never  Vaping Use   Vaping Use: Never used  Substance and Sexual Activity   Alcohol use: No   Drug use: No   Sexual activity: Yes    Partners: Female    Birth control/protection: None  Other Topics Concern   Not on file  Social History Narrative   1 grand and 1 due 08/2021  Social Determinants of Health   Financial Resource Strain: Not on file  Food Insecurity: Not on file  Transportation Needs: Not on file  Physical Activity: Not on file  Stress: Not on file  Social Connections: Not on file        Objective:  Physical Exam: BP 131/86   Pulse 80   Temp 97.7 F (36.5 C) (Temporal)   Ht '6\' 3"'$  (1.905 m)   Wt 252 lb (114.3 kg)   SpO2 96%   BMI 31.50 kg/m   Body mass index is 31.5 kg/m. Wt Readings from Last 3 Encounters:  11/10/21 252 lb (114.3 kg)  11/02/21 253 lb (114.8 kg)  10/05/21 256 lb (116.1 kg)   Gen: NAD, resting comfortably HEENT: TMs normal bilaterally. OP clear. No thyromegaly noted.  CV: RRR with no murmurs appreciated Pulm: NWOB, CTAB with no crackles, wheezes, or rhonchi GI: Normal bowel sounds present. Soft, Nontender, Nondistended. MSK: no edema, cyanosis, or clubbing noted Skin: warm, dry Neuro: CN2-12 grossly intact. Strength 5/5 in upper and lower extremities. Reflexes symmetric and intact bilaterally.  Psych: Normal affect and thought content     Marcianne Ozbun M. Jerline Pain, MD 11/10/2021 8:13 AM

## 2021-11-14 NOTE — Progress Notes (Signed)
Please inform patient of the following:  Labs are all stable.  A1c well controlled at 5.9.  Do not need to make any changes to medications or treatment plan at this time.  He should continue to work on diet and exercise and we can recheck everything in a year.  I would like to see him back in 6 months to recheck his A1c.

## 2021-11-16 ENCOUNTER — Other Ambulatory Visit (HOSPITAL_COMMUNITY): Payer: Self-pay

## 2021-11-16 ENCOUNTER — Other Ambulatory Visit: Payer: Self-pay | Admitting: Family Medicine

## 2021-11-16 MED ORDER — GABAPENTIN 300 MG PO CAPS
900.0000 mg | ORAL_CAPSULE | Freq: Three times a day (TID) | ORAL | 3 refills | Status: DC
Start: 2021-11-16 — End: 2022-09-20
  Filled 2021-11-16: qty 810, 90d supply, fill #0
  Filled 2022-01-27 – 2022-02-04 (×2): qty 810, 90d supply, fill #1
  Filled 2022-03-02 – 2022-04-28 (×3): qty 810, 90d supply, fill #2
  Filled 2022-07-09 – 2022-07-15 (×2): qty 810, 90d supply, fill #3

## 2021-11-21 ENCOUNTER — Other Ambulatory Visit (HOSPITAL_COMMUNITY): Payer: Self-pay

## 2021-11-22 ENCOUNTER — Other Ambulatory Visit (HOSPITAL_COMMUNITY): Payer: Self-pay

## 2021-11-22 ENCOUNTER — Ambulatory Visit: Payer: Medicare PPO | Admitting: Podiatry

## 2021-11-22 ENCOUNTER — Ambulatory Visit (INDEPENDENT_AMBULATORY_CARE_PROVIDER_SITE_OTHER): Payer: Medicare PPO

## 2021-11-22 DIAGNOSIS — M21611 Bunion of right foot: Secondary | ICD-10-CM | POA: Diagnosis not present

## 2021-11-22 DIAGNOSIS — B351 Tinea unguium: Secondary | ICD-10-CM

## 2021-11-22 DIAGNOSIS — M2031 Hallux varus (acquired), right foot: Secondary | ICD-10-CM | POA: Diagnosis not present

## 2021-11-22 MED ORDER — CICLOPIROX 8 % EX SOLN
Freq: Every day | CUTANEOUS | 2 refills | Status: DC
Start: 2021-11-22 — End: 2022-12-26
  Filled 2021-11-22: qty 6.6, 30d supply, fill #0
  Filled 2021-12-17: qty 6.6, 30d supply, fill #1
  Filled 2022-01-13: qty 6.6, 30d supply, fill #2

## 2021-11-22 NOTE — Patient Instructions (Signed)
Ciclopirox Topical Solution What is this medication? CICLOPIROX (sye kloe PEER ox) treats fungal infections of the nails. It belongs to a group of medications called antifungals. It will not treat infections caused by bacteria or viruses. This medicine may be used for other purposes; ask your health care provider or pharmacist if you have questions. COMMON BRAND NAME(S): Ciclodan Nail Solution, CNL8, Penlac What should I tell my care team before I take this medication? They need to know if you have any of these conditions: Diabetes (high blood sugar) Immune system problems Organ transplant Receiving steroid inhalers, cream, or lotion Seizures Tingling of the fingers or toes or other nerve disorder An unusual or allergic reaction to ciclopirox, other medications, foods, dyes, or preservatives Pregnant or trying to get pregnant Breast-feeding How should I use this medication? This medication is for external use only. Do not take by mouth. Wash your hands before and after use. If you are treating your hands, only wash your hands before use. Do not get it in your eyes. If you do, rinse your eyes with plenty of cool tap water. Use it as directed on the prescription label at the same time every day. Do not use it more often than directed. Use the medication for the full course as directed by your care team, even if you think you are better. Do not stop using it unless your care team tells you to stop it early. Apply a thin film of the medication to the affected area. Talk to your care team about the use of this medication in children. While it may be prescribed for children as young as 12 years for selected conditions, precautions do apply. Overdosage: If you think you have taken too much of this medicine contact a poison control center or emergency room at once. NOTE: This medicine is only for you. Do not share this medicine with others. What if I miss a dose? If you miss a dose, use it as soon as  you can. If it is almost time for your next dose, use only that dose. Do not use double or extra doses. What may interact with this medication? Interactions are not expected. Do not use any other skin products without telling your care team. This list may not describe all possible interactions. Give your health care provider a list of all the medicines, herbs, non-prescription drugs, or dietary supplements you use. Also tell them if you smoke, drink alcohol, or use illegal drugs. Some items may interact with your medicine. What should I watch for while using this medication? Visit your care team for regular checks on your progress. It may be some time before you see the benefit from this medication. Do not use nail polish or other nail cosmetic products on the treated nails. Removal of the unattached, infected nail by your care team is needed with use of this medication. If you have diabetes or numbness in your fingers or toes, talk to your care team about proper nail care. What side effects may I notice from receiving this medication? Side effects that you should report to your care team as soon as possible: Allergic reactions--skin rash, itching, hives, swelling of the face, lips, tongue, or throat Burning, itching, crusting, or peeling of treated skin Side effects that usually do not require medical attention (report to your care team if they continue or are bothersome): Change in nail shape, thickness, or color Mild skin irritation, redness, or dryness This list may not describe all possible side   effects. Call your doctor for medical advice about side effects. You may report side effects to FDA at 1-800-FDA-1088. Where should I keep my medication? Keep out of the reach of children and pets. Store at room temperature between 20 and 25 degrees C (68 and 77 degrees F). This medication is flammable. Avoid exposure to heat, fire, flame, and smoking. Get rid of medications that are no longer needed  or have expired: Take the medication to a medication take-back program. Check with your pharmacy or law enforcement to find a location. If you cannot return the medication, check the label or package insert to see if the medication should be thrown out in the garbage or flushed down the toilet. If you are not sure, ask your care team. If it is safe to put in the trash, take the medication out of the container. Mix the medication with cat litter, dirt, coffee grounds, or other unwanted substance. Seal the mixture in a bag or container. Put it in the trash. NOTE: This sheet is a summary. It may not cover all possible information. If you have questions about this medicine, talk to your doctor, pharmacist, or health care provider.  2023 Elsevier/Gold Standard (2020-12-28 00:00:00) Bunion A bunion (hallux valgus) is a bump that forms slowly on the inner side of the big toe joint. It occurs when the big toe turns toward the second toe. Bunions may be small at first, but they often get larger over time. They can make walking painful. What are the causes? This condition may be caused by: Wearing narrow or pointed shoes that force the big toe to press against the other toes. Abnormal foot development that causes the foot to roll inward. Changes in the foot that are caused by certain diseases, such as rheumatoid arthritis or polio. A foot injury. What increases the risk? The following factors may make you more likely to develop this condition: Wearing shoes that squeeze the toes together. Having certain diseases, such as: Rheumatoid arthritis. Polio. Cerebral palsy. Having family members who have bunions. Being born with abnormally shaped feet (a foot deformity), such as flat feet or low arches. Doing activities that put a lot of pressure on the feet, such as ballet dancing. What are the signs or symptoms?  The main symptom of this condition is a bump on your big toe that you can notice. Other  symptoms may include: Pain. Redness and inflammation around your big toe. Thick or hardened skin on your big toe or between your toes. Stiffness or loss of motion in your big toe. Trouble with walking. How is this diagnosed? This condition may be diagnosed based on your symptoms, medical history, and activities. You may also have tests and imaging, such as: X-rays. These allow your health care provider to check the position of the bones in your foot and look for damage to your joint. They also help your health care provider determine the severity of your bunion and the best way to treat it. Joint aspiration. In this test, a sample of fluid is removed from the toe joint. This test may be done if you are in a lot of pain. It helps rule out diseases that cause painful swelling of the joints, such as arthritis or gout. How is this treated? Treatment depends on the severity of your symptoms. The goal of treatment is to relieve symptoms and prevent your bunion from getting worse. Your health care provider may recommend: Wearing shoes that have a wide toe box, or  using bunion pads to cushion the affected area. Taping your toes together to keep them in a normal position. Placing a device inside your shoe (orthotic device) to help reduce pressure on your toe joint. Taking medicine to ease pain and inflammation. Putting ice or heat on the affected area. Doing stretching exercises. Surgery, for severe cases. Follow these instructions at home: Managing pain, stiffness, and swelling     If directed, put ice on the painful area. To do this: Put ice in a plastic bag. Place a towel between your skin and the bag. Leave the ice on for 20 minutes, 2-3 times a day. Remove the ice if your skin turns bright red. This is very important. If you cannot feel pain, heat, or cold, you have a greater risk of damage to the area. If directed, apply heat to the affected area before you exercise. Use the heat source  that your health care provider recommends, such as a moist heat pack or a heating pad. Place a towel between your skin and the heat source. Leave the heat on for 20-30 minutes. Remove the heat if your skin turns bright red. This is especially important if you are unable to feel pain, heat, or cold. You have a greater risk of getting burned. General instructions Do exercises as told by your health care provider. Support your toe joint with proper footwear, shoe padding, or taping as told by your health care provider. Take over-the-counter and prescription medicines only as told by your health care provider. Do not use any products that contain nicotine or tobacco, such as cigarettes, e-cigarettes, and chewing tobacco. If you need help quitting, ask your health care provider. Keep all follow-up visits. This is important. Contact a health care provider if: Your symptoms get worse. Your symptoms do not improve in 2 weeks. Get help right away if: You have severe pain and trouble with walking. Summary A bunion is a bump on the inner side of the big toe joint that forms when the big toe turns toward the second toe. Bunions can make walking painful. Treatment depends on the severity of your symptoms. Support your toe joint with proper footwear, shoe padding, or taping as told by your health care provider. This information is not intended to replace advice given to you by your health care provider. Make sure you discuss any questions you have with your health care provider. Document Revised: 05/23/2019 Document Reviewed: 05/23/2019 Elsevier Patient Education  [place].

## 2021-11-22 NOTE — Progress Notes (Signed)
Subjective:   Patient ID: Jeffery Wood, male   DOB: 66 y.o.   MRN: [id number]   HPI Chief Complaint  Patient presents with   Bunions    Right foot bunion, Started having pain 8 months ago, rate of pain 6 out of 10, TX; pads brace, X-Rays done today     66 year old male presents for above concerns.  He has noticed it getting large and starting to hurt more. He has also noticed the big toe going under his second toe. He has tried OTC pads and bunion corrected. He states the big toe get red at times. He does have a history of gout but it has "faded away" over the last 10 years. He has had gout all over, incluiding his kneess that needed surgery to "scrape out the crystals".   On lamisil for nail fungus, asking about other options.   Review of Systems  All other systems reviewed and are negative.  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, in remission-chest, but all over   Memory loss    mild - r/t chemo, 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   Neuromuscular disorder ([place])    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   Sleep apnea    does not use anything for OSA    Past Surgical History:  Procedure Laterality Date   BACK SURGERY     L4-L5 bracket and screws   BICEPS TENDON REPAIR Left    CARPAL TUNNEL RELEASE     COLONOSCOPY     CORONARY BALLOON ANGIOPLASTY N/A 06/10/2021   Procedure: CORONARY BALLOON  ANGIOPLASTY;  Surgeon: Jettie Booze, MD;  Location: [facility];  Service: Cardiovascular;  Laterality: N/A;   CORONARY STENT INTERVENTION N/A 06/10/2021   Procedure: CORONARY STENT INTERVENTION;  Surgeon: Jettie Booze, MD;  Location: [facility];  Service: Cardiovascular;  Laterality: N/A;   FINGER SURGERY     INTRAVASCULAR ULTRASOUND/IVUS N/A 06/10/2021   Procedure: Intravascular Ultrasound/IVUS;  Surgeon: Jettie Booze, MD;  Location: [facility];  Service: Cardiovascular;  Laterality: N/A;   KNEE ARTHROSCOPY     ACL tear   LEFT HEART CATH AND CORONARY ANGIOGRAPHY N/A 06/10/2021   Procedure: LEFT HEART CATH AND CORONARY ANGIOGRAPHY;  Surgeon: Jettie Booze, MD;  Location: [facility];  Service: Cardiovascular;  Laterality: N/A;   LEFT HEART CATHETERIZATION WITH CORONARY ANGIOGRAM N/A 12/07/2010   Procedure: LEFT HEART CATHETERIZATION WITH CORONARY ANGIOGRAM;  Surgeon: Leonie Man, MD;  [facility];  Service: Cardiovascular;  Laterality: N/A;   LUMBAR LAMINECTOMY/DECOMPRESSION MICRODISCECTOMY Right 12/09/2020   Procedure: MICRODISCECTOMY, RIGHT LUMBAR TWO-THREE, LUMBAR THREE-FOUR;  Surgeon: Newman Pies,  MD;  Location: [facility];  Service: Neurosurgery;  Laterality: Right;   PILONIDAL CYST EXCISION     PORT-A-CATH REMOVAL N/A 11/09/2017   Procedure: REMOVAL PORT-A-CATH;  Surgeon: [name], MD;  Location: [facility];  Service: General;  Laterality: N/A;  local   PORTACATH PLACEMENT Left 02/27/2017   Procedure: INSERTION PORT-A-CATH;  Surgeon: [name], MD;  Location: [facility];  Service: General;  Laterality: Left;   UPPER GASTROINTESTINAL ENDOSCOPY  05/16/2006   VIDEO BRONCHOSCOPY WITH ENDOBRONCHIAL ULTRASOUND N/A 01/24/2017   Procedure: VIDEO BRONCHOSCOPY WITH ENDOBRONCHIAL ULTRASOUND with TRANSBRONCHIAL BIOPSY;  Surgeon: Grace Isaac, MD;  Location: [facility];  Service: Thoracic;  Laterality:  N/A;   VIDEO MEDIASTINOSCOPY N/A 02/05/2017   Procedure: VIDEO MEDIASTINOSCOPY;  Surgeon: Grace Isaac, MD;  Location: [facility];  Service: Thoracic;  Laterality: N/A;     Current Outpatient Medications:    albuterol (PROVENTIL) (2.5 MG/3ML) 0.083% nebulizer solution, Take 2.5 mg by nebulization every 4 (four) hours as needed for wheezing or shortness of breath., Disp: , Rfl:    aspirin EC 81 MG tablet, Take 81 mg by mouth daily., Disp: , Rfl:    atorvastatin (LIPITOR) 40 MG tablet, Take 1 tablet (40 mg total) by mouth daily., Disp: 90 tablet, Rfl: 3   augmented betamethasone dipropionate (DIPROLENE-AF) 0.05 % cream, APPLY AS DIRECTED TO AFFECTED AREAS ON THE SKIN, Disp: 50 g, Rfl: 99   benzonatate (TESSALON) 200 MG capsule, Take 1 capsule by mouth 2 times daily as needed for cough., Disp: 20 capsule, Rfl: 0   celecoxib (CELEBREX) 200 MG capsule, Take 1 capsule (200 mg total) by mouth 2 (two) times daily as needed., Disp: 60 capsule, Rfl: 5   clopidogrel (PLAVIX) 75 MG tablet, Take 1 tablet (75 mg total) by mouth daily with breakfast., Disp: 90 tablet, Rfl: 3   cyclobenzaprine (FLEXERIL) 10 MG tablet, Take 1 tablet (10 mg total) by mouth 3 (three) times daily as needed for muscle spasms., Disp: 30 tablet, Rfl: 1   docusate sodium (COLACE) 100 MG capsule, Take 1 capsule (100 mg total) by mouth 2 (two) times daily., Disp: 10 capsule, Rfl: 0   DULoxetine (CYMBALTA) 30 MG capsule, TAKE 1 CAPSULE (30 MG TOTAL) BY MOUTH AT BEDTIME., Disp: 90 capsule, Rfl: 1   gabapentin (NEURONTIN) 300 MG capsule, Take 3 capsules (900 mg total) by mouth 3 (three) times daily., Disp: 810 capsule, Rfl: 3   glimepiride (AMARYL) 4 MG tablet, Take 1/2 tablet by mouth before biggest meal of the day., Disp: 60 tablet, Rfl: 1   levalbuterol (XOPENEX HFA) 45 MCG/ACT inhaler, Inhale 2 puffs into the lungs daily as needed for wheezing or shortness of breath., Disp: 15 g, Rfl: 1   loratadine (CLARITIN) 10 MG tablet, Take 10 mg  by mouth daily., Disp: , Rfl:    metFORMIN (GLUCOPHAGE) 1000 MG tablet, TAKE ONE (1) TABLET BY MOUTH TWO (2) TIMES DAILY, Disp: 180 tablet, Rfl: 0   nitroGLYCERIN (NITROSTAT) 0.4 MG SL tablet, Place 1 tablet (0.4 mg total) under the tongue every 5 (five) minutes as needed for chest pain., Disp: 25 tablet, Rfl: 3   oxyCODONE-acetaminophen (PERCOCET) 10-325 MG tablet, Take 1 tablet by mouth every 4 (four) hours as needed for pain., Disp: 30 tablet, Rfl: 0   pantoprazole (PROTONIX) 40 MG tablet, Take 1 tablet (40 mg total) by mouth daily., Disp: 90 tablet, Rfl: 1   tamsulosin (FLOMAX) 0.4 MG CAPS capsule, TAKE 1 CAPSULE (0.4 MG TOTAL) BY  MOUTH DAILY., Disp: 90 capsule, Rfl: 1   terbinafine (LAMISIL) 250 MG tablet, Take 1 tablet (250 mg total) by mouth daily., Disp: 90 tablet, Rfl: 0   tiotropium (SPIRIVA HANDIHALER) 18 MCG inhalation capsule, PLACE ONE CAPSULE IN THE INHALER AND INHALE ONCE DAILY, Disp: 30 capsule, Rfl: 99   traMADol (ULTRAM) 50 MG tablet, Take 1 tablet (50 mg total) by mouth every 8 (eight) hours as needed., Disp: 50 tablet, Rfl: 1   traZODone (DESYREL) 50 MG tablet, Take 1/2-1 tablet by mouth at bedtime as needed for sleep., Disp: 30 tablet, Rfl: 3  No Known Allergies        Objective:  Physical Exam  General: AAO x3, NAD  Dermatological: Nails are hypertrophic, dystrophic, brittle, discolored, elongated 10. No surrounding redness or drainage.  No open lesions or pre-ulcerative lesions are identified today.  No pain to the toenails.  Vascular: Dorsalis Pedis artery and Posterior Tibial artery pedal pulses are 2/4 bilateral with immedate capillary fill time.  There is no pain with calf compression, swelling, warmth, erythema.   Neruologic: Sensation decreased with [place] Mule monofilament he has neuropathy diagnosis.  Musculoskeletal: On the right foot bunion is present there is hallux malleus noted at the IPJ.  There is decreased range of motion MPJ as well as IPJ.   No other area discomfort.  MMT 5/5.  Gait: Unassisted, Nonantalgic.       Assessment:   66 year old male with bunion, hallux malleus; onychomycosis     Plan:  -Treatment options discussed including all alternatives, risks, and complications -Etiology of symptoms were discussed -X-rays were obtained and reviewed with the patient.  3 views of the right foot were obtained.  No evidence of acute fracture.  Bunions present with hallux malleus and hammertoes present.  Arthritic changes present of the midfoot. -In regards to the bunion, hallux malleus we discussed the conservative as well as surgical treatment options.  After discussion he is he tried conservative care and was proceed with surgery.  We discussed MPJ fusion, IPJ fusion versus also bunionectomy with IPJ fusion.  Discussed the surgery as well as postoperative course.  He cannot do it at this time and will consider doing this once school is out as he has to drive his granddaughter.  For now continue offloading pads, shoe modifications to avoid pressure -Finish course of Lamisil as already prescribed I added Penlac to help with nail fungus.  [place] when school

## 2021-11-23 ENCOUNTER — Other Ambulatory Visit (HOSPITAL_COMMUNITY): Payer: Self-pay

## 2021-11-23 ENCOUNTER — Ambulatory Visit (INDEPENDENT_AMBULATORY_CARE_PROVIDER_SITE_OTHER): Payer: Medicare PPO | Admitting: Internal Medicine

## 2021-11-23 DIAGNOSIS — R0602 Shortness of breath: Secondary | ICD-10-CM | POA: Diagnosis not present

## 2021-11-23 LAB — PULMONARY FUNCTION TEST
DL/VA % pred: 122 %
DL/VA: 5.03 ml/min/mmHg/L
DLCO cor % pred: 109 %
DLCO cor: 31.27 ml/min/mmHg
DLCO unc % pred: 111 %
DLCO unc: 31.62 ml/min/mmHg
FEF 25-75 Post: 1.65 L/sec
FEF 25-75 Pre: 0.9 L/sec
FEF2575-%Change-Post: 82 %
FEF2575-%Pred-Post: 56 %
FEF2575-%Pred-Pre: 31 %
FEV1-%Change-Post: 25 %
FEV1-%Pred-Post: 62 %
FEV1-%Pred-Pre: 50 %
FEV1-Post: 2.33 L
FEV1-Pre: 1.86 L
FEV1FVC-%Change-Post: 6 %
FEV1FVC-%Pred-Pre: 72 %
FEV6-%Change-Post: 19 %
FEV6-%Pred-Post: 84 %
FEV6-%Pred-Pre: 70 %
FEV6-Post: 3.96 L
FEV6-Pre: 3.32 L
FEV6FVC-%Change-Post: 1 %
FEV6FVC-%Pred-Post: 103 %
FEV6FVC-%Pred-Pre: 102 %
FVC-%Change-Post: 18 %
FVC-%Pred-Post: 81 %
FVC-%Pred-Pre: 68 %
FVC-Post: 4.02 L
FVC-Pre: 3.41 L
Post FEV1/FVC ratio: 58 %
Post FEV6/FVC ratio: 98 %
Pre FEV1/FVC ratio: 54 %
Pre FEV6/FVC Ratio: 97 %
RV % pred: 146 %
RV: 3.6 L
TLC % pred: 97 %
TLC: 7.21 L

## 2021-11-23 NOTE — Progress Notes (Signed)
Full PFT Performed Today  

## 2021-11-23 NOTE — Patient Instructions (Signed)
Full PFT Performed Today  

## 2021-11-28 ENCOUNTER — Telehealth: Payer: Self-pay

## 2021-11-28 DIAGNOSIS — R0602 Shortness of breath: Secondary | ICD-10-CM

## 2021-11-28 DIAGNOSIS — R942 Abnormal results of pulmonary function studies: Secondary | ICD-10-CM

## 2021-11-28 NOTE — Telephone Encounter (Signed)
The patient has been notified of the result and verbalized understanding.  All questions (if any) were answered.     

## 2021-11-28 NOTE — Telephone Encounter (Signed)
-----   Message from Fay Records, MD sent at 11/28/2021  4:29 PM EDT ----- Lung function studies show obstructive /restrictive process that responseds to bronchodilators   I would recomm referral to pulmonary medicine

## 2021-12-01 ENCOUNTER — Other Ambulatory Visit: Payer: Self-pay | Admitting: Physician Assistant

## 2021-12-02 ENCOUNTER — Other Ambulatory Visit (HOSPITAL_COMMUNITY): Payer: Self-pay

## 2021-12-02 MED ORDER — PANTOPRAZOLE SODIUM 40 MG PO TBEC
40.0000 mg | DELAYED_RELEASE_TABLET | Freq: Every day | ORAL | 1 refills | Status: DC
  Filled 2021-12-02: qty 90, 90d supply, fill #0
  Filled 2022-03-02: qty 90, 90d supply, fill #1

## 2021-12-13 ENCOUNTER — Other Ambulatory Visit: Payer: Self-pay | Admitting: Family Medicine

## 2021-12-14 ENCOUNTER — Other Ambulatory Visit (HOSPITAL_COMMUNITY): Payer: Self-pay

## 2021-12-14 MED ORDER — METFORMIN HCL 1000 MG PO TABS
1000.0000 mg | ORAL_TABLET | Freq: Two times a day (BID) | ORAL | 0 refills | Status: DC
  Filled 2021-12-14: qty 180, 90d supply, fill #0

## 2021-12-17 ENCOUNTER — Other Ambulatory Visit (HOSPITAL_COMMUNITY): Payer: Self-pay

## 2021-12-18 ENCOUNTER — Other Ambulatory Visit (HOSPITAL_COMMUNITY): Payer: Self-pay

## 2021-12-19 ENCOUNTER — Other Ambulatory Visit (HOSPITAL_COMMUNITY): Payer: Self-pay

## 2021-12-28 NOTE — Progress Notes (Unsigned)
Subjective:    Patient ID: Jeffery Wood, male    DOB: July 20, 1955, 66 y.o.   MRN: [id number]  [07/22/13- Dr Gwenette Greet- The patient is a 66 year old male who I've been asked to see for management of obstructive sleep apnea.  He was apparently diagnosed 10 years ago, and was tried on CPAP with very poor tolerance. He tells me he was tried on all different types of machines, pressure settings, and different masks without success. He could simply not keep the mask on his face. This is gone untreated over the years, and he recently underwent a home sleep test in March of this year which showed moderate OSA. He had an AHI of 24 events per hour with desaturation as low as 85%. The patient states that he is still having loud snoring, and bed partner comments on witnessed apneas.  He has frequent awakenings at night, and is not rested in the mornings upon arising. He has significant daytime sleepiness with any inactivity, and will fall asleep in the evenings watching television or movies. His Epworth score today is 22, and the patient tells that he has lost 30 pounds over the last 2 years.] ------------------------------------------ 12/29/21- New referral courtesy of Dr [name] [age]M former smoker with concern of shortness of breath Medical problem list includes HTN, DM, CAD, OSA, Insomnia,Asthma/ COPD, GERD, Neuromuscular Disorder, Psoriasis, Lumbar Spondylolisthesis, BPH, Non-Hodkins Lymphoma, Gout,  -Neb albuterol, Xopenex hfa, Spiriva handihaler,  PFT 11/23/21- Severe obstruction with signif response to BD, Nl DLCO Covid vax- 2 J&J Flu vax   had                                   Jeffery Wood had seen me for general pulmonary 20 years ago.  Worked as a Dealer.  I do not have those records.  He had distinct improvement in dyspnea on exertion at the time of cardiac stenting.  A few weeks after that he rather abruptly noted more dyspnea on exertion which has not improved.  No cough little wheeze.  He has a  rescue inhaler which he is not using.  Reports Spiriva "works great" when needed, used intermittently. He is concerned about nagging pain in right shoulder.  Discomfort in that shoulder had led to evaluation finding his lymphoma.  He is currently in short-term remission followed by oncology. Prior to Admission medications   Medication Sig Start Date End Date Taking? Authorizing Provider  albuterol (PROVENTIL) (2.5 MG/3ML) 0.083% nebulizer solution Take 2.5 mg by nebulization every 4 (four) hours as needed for wheezing or shortness of breath.   Yes [provider]  aspirin EC 81 MG tablet Take 81 mg by mouth daily.   Yes [provider]  atorvastatin (LIPITOR) 40 MG tablet Take 1 tablet (40 mg total) by mouth daily. 07/01/21  Yes Swinyer, Lanice Schwab, NP  augmented betamethasone dipropionate (DIPROLENE-AF) 0.05 % cream APPLY AS DIRECTED TO AFFECTED AREAS ON THE SKIN 03/01/21 03/01/22 Yes [name], MD  benzonatate (TESSALON) 200 MG capsule Take 1 capsule by mouth 2 times daily as needed for cough. 09/20/21  Yes [name], MD  celecoxib (CELEBREX) 200 MG capsule Take 1 capsule (200 mg total) by mouth 2 (two) times daily as needed. 08/29/21  Yes [name], MD  ciclopirox [facility] 8 % solution Apply topically at bedtime. Apply over nail and surrounding skin. Apply daily over previous coat. After  seven (7) days, may remove with alcohol and continue cycle. 11/22/21  Yes Trula Slade, DPM  clopidogrel (PLAVIX) 75 MG tablet Take 1 tablet (75 mg total) by mouth daily with breakfast. 06/12/21  Yes Almyra Deforest, PA  cyclobenzaprine (FLEXERIL) 10 MG tablet Take 1 tablet (10 mg total) by mouth 3 (three) times daily as needed for muscle spasms. 12/09/20  Yes Viona Gilmore D, NP  docusate sodium (COLACE) 100 MG capsule Take 1 capsule (100 mg total) by mouth 2 (two) times daily. 12/09/20  Yes Viona Gilmore D, NP  DULoxetine (CYMBALTA) 30 MG capsule TAKE 1 CAPSULE (30 MG TOTAL) BY  MOUTH AT BEDTIME. 08/25/21 08/25/22 Yes [name], MD  Fluticasone-Umeclidin-Vilant (TRELEGY ELLIPTA) 100-62.5-25 MCG/ACT AEPB Inhale 1 puff into the lungs daily at 2 PM. 12/29/21  Yes Lon Klippel D, MD  gabapentin (NEURONTIN) 300 MG capsule Take 3 capsules (900 mg total) by mouth 3 (three) times daily. 11/16/21 11/16/22 Yes [name], MD  glimepiride (AMARYL) 4 MG tablet Take 1/2 tablet by mouth before biggest meal of the day. 06/09/21  Yes [name], MD  levalbuterol [facility] HFA) 45 MCG/ACT inhaler Inhale 2 puffs into the lungs daily as needed for wheezing or shortness of breath. 09/20/21  Yes [name], MD  loratadine (CLARITIN) 10 MG tablet Take 10 mg by mouth daily.   Yes [provider]  metFORMIN (GLUCOPHAGE) 1000 MG tablet Take 1 tablet (1,000 mg total) by mouth 2 (two) times daily. 12/14/21  Yes [name], MD  oxyCODONE-acetaminophen (PERCOCET) 10-325 MG tablet Take 1 tablet by mouth every 4 (four) hours as needed for pain. 12/09/20  Yes Viona Gilmore D, NP  pantoprazole (PROTONIX) 40 MG tablet Take 1 tablet (40 mg total) by mouth daily. 12/02/21  Yes Fay Records, MD  tamsulosin (FLOMAX) 0.4 MG CAPS capsule TAKE 1 CAPSULE (0.4 MG TOTAL) BY MOUTH DAILY. 08/29/21 08/29/22 Yes [name], MD  terbinafine (LAMISIL) 250 MG tablet Take 1 tablet (250 mg total) by mouth daily. 08/29/21  Yes [name], MD  tiotropium (SPIRIVA HANDIHALER) 18 MCG inhalation capsule PLACE ONE CAPSULE IN THE INHALER AND INHALE ONCE DAILY 05/11/21 05/11/22 Yes [name], MD  traMADol (ULTRAM) 50 MG tablet Take 1 tablet (50 mg total) by mouth every 8 (eight) hours as needed. 10/26/21  Yes   traZODone (DESYREL) 50 MG tablet Take 1/2-1 tablet by mouth at bedtime as needed for sleep. 08/29/21  Yes [name], MD  nitroGLYCERIN (NITROSTAT) 0.4 MG SL tablet Place 1 tablet (0.4 mg total) under the tongue every 5 (five) minutes as needed for chest pain. Patient not taking:  Reported on 12/29/2021 06/11/21   Almyra Deforest, PA   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    CAD (coronary artery disease) 2023   5v   Cancer ([place])    basal cell of right temple; carcinoids   Complication of anesthesia    hard to wake up after bronchoscopy   COPD (chronic obstructive pulmonary disease) ([place])    Diabetes ([place])    type 2   Dyspnea    GERD (gastroesophageal reflux disease)    Gout    resolved, no current problem   Headache    History of hiatal hernia 05/16/2006   small noted on EGD   Hypertension    hx   Lung mass  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[place])    last chemo 05/2017, in remission-chest, but all over   Memory loss    mild - r/t chemo, 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   Neuromuscular disorder ([place])    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   Sleep apnea    does not use anything for OSA   Past Surgical History:  Procedure Laterality Date   BACK SURGERY     L4-L5 bracket and screws   BICEPS TENDON REPAIR Left    CARPAL TUNNEL RELEASE     COLONOSCOPY     CORONARY BALLOON ANGIOPLASTY N/A 06/10/2021   Procedure: CORONARY BALLOON ANGIOPLASTY;  Surgeon: Jettie Booze, MD;  Location: [place] [facility];  Service: Cardiovascular;  Laterality: N/A;   CORONARY STENT INTERVENTION N/A 06/10/2021   Procedure: CORONARY STENT INTERVENTION;  Surgeon: Jettie Booze, MD;  Location: [place] [facility];  Service: Cardiovascular;  Laterality: N/A;   FINGER SURGERY     INTRAVASCULAR ULTRASOUND/IVUS N/A 06/10/2021   Procedure: Intravascular Ultrasound/IVUS;  Surgeon: Jettie Booze, MD;  Location: [place] [facility];  Service: Cardiovascular;  Laterality: N/A;   KNEE ARTHROSCOPY     ACL tear   LEFT HEART CATH AND CORONARY ANGIOGRAPHY N/A 06/10/2021   Procedure: LEFT HEART CATH AND CORONARY ANGIOGRAPHY;  Surgeon: Jettie Booze, MD;  Location: [place] [facility];  Service:  Cardiovascular;  Laterality: N/A;   LEFT HEART CATHETERIZATION WITH CORONARY ANGIOGRAM N/A 12/07/2010   Procedure: LEFT HEART CATHETERIZATION WITH CORONARY ANGIOGRAM;  Surgeon: Leonie Man, MD;  Location: [facility];  Service: Cardiovascular;  Laterality: N/A;   LUMBAR LAMINECTOMY/DECOMPRESSION MICRODISCECTOMY Right 12/09/2020   Procedure: MICRODISCECTOMY, RIGHT LUMBAR TWO-THREE, LUMBAR THREE-FOUR;  Surgeon: Newman Pies, MD;  Location: [place];  Service: Neurosurgery;  Laterality: Right;   PILONIDAL CYST EXCISION     PORT-A-CATH REMOVAL N/A 11/09/2017   Procedure: REMOVAL PORT-A-CATH;  Surgeon: [name], MD;  Location: [place];  Service: General;  Laterality: N/A;  local   PORTACATH PLACEMENT Left 02/27/2017   Procedure: INSERTION PORT-A-CATH;  Surgeon: [name], MD;  Location: [facility];  Service: General;  Laterality: Left;   UPPER GASTROINTESTINAL ENDOSCOPY  05/16/2006   VIDEO BRONCHOSCOPY WITH ENDOBRONCHIAL ULTRASOUND N/A 01/24/2017   Procedure: VIDEO BRONCHOSCOPY WITH ENDOBRONCHIAL ULTRASOUND with TRANSBRONCHIAL BIOPSY;  Surgeon: Grace Isaac, MD;  Location: [place];  Service: Thoracic;  Laterality: N/A;   VIDEO MEDIASTINOSCOPY N/A 02/05/2017   Procedure: VIDEO MEDIASTINOSCOPY;  Surgeon: Grace Isaac, MD;  Location: [place];  Service: Thoracic;  Laterality: N/A;   Family History  Problem Relation Age of Onset   Emphysema Mother    Asthma Mother    Heart disease Father    Social History   Socioeconomic History   Marital status: Married    Spouse name: Not on file   Number of children: 2   Years of education: Not on file   Highest education level: Not on file  Occupational History   Occupation: retired  Tobacco Use   Smoking status: Former    Packs/day: 3.00    Years: 20.00    Total pack years: 60.00    Types: Cigarettes    Quit date: 01/31/1988    Years since quitting: 33.9   Smokeless tobacco: Never  Vaping Use   Vaping Use:  Never used  Substance and Sexual Activity   Alcohol use: No   Drug use: No   Sexual activity: Yes  Partners: Female    Birth control/protection: None  Other Topics Concern   Not on file  Social History Narrative   1 grand and 1 due 08/2021   Social Determinants of Health   Financial Resource Strain: Not on file  Food Insecurity: Not on file  Transportation Needs: Not on file  Physical Activity: Not on file  Stress: Not on file  Social Connections: Not on file  Intimate Partner Violence: Not on file   ROS-see HPI   + = positive Constitutional:    weight loss, night sweats, fevers, chills, fatigue, lassitude. HEENT:    headaches, difficulty swallowing, tooth/dental problems, sore throat,       sneezing, itching, ear ache, nasal congestion, post nasal drip, snoring CV:    chest pain, orthopnea, PND, swelling in lower extremities, anasarca,                                  dizziness, palpitations Resp:   +shortness of breath with exertion or at rest.                productive cough,   +non-productive cough, coughing up of blood.              change in color of mucus.  wheezing.   Skin:    rash or lesions. GI:  No-   heartburn, indigestion, abdominal pain, nausea, vomiting, diarrhea,                 change in bowel habits, loss of appetite GU: dysuria, change in color of urine, no urgency or frequency.   flank pain. MS:   joint pain, stiffness, decreased range of motion, back pain. Neuro-     nothing unusual Psych:  change in mood or affect.  depression or anxiety.   memory loss.  OBJ- Physical Exam General- Alert, Oriented, Affect-appropriate, Distress- none acute Skin- rash-none, lesions- none, excoriation- none Lymphadenopathy- none Head- atraumatic            Eyes- Gross vision intact, PERRLA, conjunctivae and secretions clear            Ears- Hearing, canals-normal            Nose- Clear, no-Septal dev, mucus, polyps, erosion, perforation             Throat- Mallampati  II , mucosa clear , drainage- none, tonsils- atrophic Neck- flexible , trachea midline, no stridor , thyroid nl, carotid no bruit Chest - symmetrical excursion , unlabored           Heart/CV- RRR , no murmur , no gallop  , no rub, nl s1 s2                           - JVD- none , edema- none, stasis changes- none, varices- none           Lung- +diminished, wheeze- none, cough- none , dullness-none, rub- none           Chest wall-  Abd-  Br/ Gen/ Rectal- Not done, not indicated Extrem- cyanosis- none, clubbing, none, atrophy- none, strength- nl Neuro- grossly intact to observation     Assessment & Plan:

## 2021-12-29 ENCOUNTER — Ambulatory Visit: Payer: Medicare PPO | Admitting: Internal Medicine

## 2021-12-29 ENCOUNTER — Ambulatory Visit (INDEPENDENT_AMBULATORY_CARE_PROVIDER_SITE_OTHER): Payer: Medicare PPO

## 2021-12-29 ENCOUNTER — Encounter: Payer: Self-pay | Admitting: Internal Medicine

## 2021-12-29 VITALS — BP 132/88 | HR 71 | Ht 72.0 in | Wt 248.0 lb

## 2021-12-29 DIAGNOSIS — J449 Chronic obstructive pulmonary disease, unspecified: Secondary | ICD-10-CM

## 2021-12-29 DIAGNOSIS — I2510 Atherosclerotic heart disease of native coronary artery without angina pectoris: Secondary | ICD-10-CM | POA: Diagnosis not present

## 2021-12-29 MED ORDER — TRELEGY ELLIPTA 100-62.5-25 MCG/ACT IN AEPB: 1.0000 | INHALATION_SPRAY | Freq: Every day | RESPIRATORY_TRACT | 0 refills | Status: DC

## 2021-12-29 NOTE — Patient Instructions (Signed)
Order- CXR   dx COPD mixed type  Order- sample x 2 Trelegy 100    inhale 1 puff then rinse mouth once daily

## 2021-12-29 NOTE — Assessment & Plan Note (Signed)
Stented.  Dyspnea improved at the time of stenting and then a few weeks later relapsed.  I assume cardiology will continue to follow for reversible problems.

## 2021-12-29 NOTE — Assessment & Plan Note (Signed)
Has significant reactive airways and notes substantial improvement with Spiriva.  Not sure we can do any better with the triple therapy inhaler but we have samples of Trelegy to try Plan-Trelegy 100 samples to try instead of Spiriva for comparison

## 2021-12-30 ENCOUNTER — Other Ambulatory Visit: Payer: Self-pay | Admitting: Family Medicine

## 2021-12-31 ENCOUNTER — Other Ambulatory Visit (HOSPITAL_COMMUNITY): Payer: Self-pay

## 2022-01-02 ENCOUNTER — Other Ambulatory Visit (HOSPITAL_COMMUNITY): Payer: Self-pay

## 2022-01-02 MED ORDER — TRAZODONE HCL 50 MG PO TABS
25.0000 mg | ORAL_TABLET | Freq: Every evening | ORAL | 3 refills | Status: DC | PRN
Start: 2022-01-02 — End: 2022-05-07
  Filled 2022-01-02: qty 30, 30d supply, fill #0
  Filled 2022-01-27: qty 30, 30d supply, fill #1
  Filled 2022-03-02: qty 30, 30d supply, fill #2
  Filled 2022-03-27: qty 30, 30d supply, fill #3

## 2022-01-03 ENCOUNTER — Ambulatory Visit (INDEPENDENT_AMBULATORY_CARE_PROVIDER_SITE_OTHER): Payer: Medicare PPO

## 2022-01-03 ENCOUNTER — Telehealth: Payer: Self-pay | Admitting: Family Medicine

## 2022-01-03 ENCOUNTER — Encounter: Payer: Self-pay | Admitting: Family Medicine

## 2022-01-03 VITALS — BP 122/71 | HR 59 | Temp 98.0°F | Wt 248.4 lb

## 2022-01-03 DIAGNOSIS — Z0000 Encounter for general adult medical examination without abnormal findings: Secondary | ICD-10-CM

## 2022-01-03 NOTE — Progress Notes (Signed)
Subjective:   Jeffery Wood is a 66 y.o. male who presents for an Initial Medicare Annual Wellness Visit.  Review of Systems     Cardiac Risk Factors include: advanced age (>42mn, >>62women);diabetes mellitus;hypertension;dyslipidemia;male gender;obesity (BMI >30kg/m2)     Objective:    Today's Vitals   01/03/22 0744  BP: 122/71  Pulse: (!) 59  Temp: 98 F (36.7 C)  SpO2: 99%  Weight: 248 lb 6.4 oz (112.7 kg)   Body mass index is 33.69 kg/m.     01/03/2022    8:07 AM 11/02/2021    8:55 AM 06/10/2021    6:01 AM 12/09/2020    9:44 AM 03/02/2020    8:01 AM 09/01/2019    8:11 AM 03/03/2019    8:13 AM  Advanced Directives  Does Patient Have a Medical Advance Directive? Yes Yes Yes Yes Yes Yes Yes  Type of AParamedicof [place]Living will HCross PlainsLiving will HCostillaLiving will HStanleyLiving will HDriftwoodLiving will HSan LucasLiving will HFallstonLiving will  Does patient want to make changes to medical advance directive?  No - Patient declined No - Patient declined  No - Patient declined No - Patient declined No - Patient declined  Copy of HPenderin Chart? No - copy requested No - copy requested   No - copy requested No - copy requested No - copy requested    Current Medications (verified) Outpatient Encounter Medications as of 01/03/2022  Medication Sig   albuterol (PROVENTIL) (2.5 MG/3ML) 0.083% nebulizer solution Take 2.5 mg by nebulization every 4 (four) hours as needed for wheezing or shortness of breath.   aspirin EC 81 MG tablet Take 81 mg by mouth daily.   atorvastatin (LIPITOR) 40 MG tablet Take 1 tablet (40 mg total) by mouth daily.   augmented betamethasone dipropionate (DIPROLENE-AF) 0.05 % cream APPLY AS DIRECTED TO AFFECTED AREAS ON THE SKIN   celecoxib (CELEBREX) 200 MG capsule Take 1 capsule (200  mg total) by mouth 2 (two) times daily as needed.   ciclopirox (PENLAC) 8 % solution Apply topically at bedtime. Apply over nail and surrounding skin. Apply daily over previous coat. After seven (7) days, may remove with alcohol and continue cycle.   clopidogrel (PLAVIX) 75 MG tablet Take 1 tablet (75 mg total) by mouth daily with breakfast.   cyclobenzaprine (FLEXERIL) 10 MG tablet Take 1 tablet (10 mg total) by mouth 3 (three) times daily as needed for muscle spasms.   docusate sodium (COLACE) 100 MG capsule Take 1 capsule (100 mg total) by mouth 2 (two) times daily.   DULoxetine (CYMBALTA) 30 MG capsule TAKE 1 CAPSULE (30 MG TOTAL) BY MOUTH AT BEDTIME.   Fluticasone-Umeclidin-Vilant (TRELEGY ELLIPTA) 100-62.5-25 MCG/ACT AEPB Inhale 1 puff into the lungs daily at 2 PM.   gabapentin (NEURONTIN) 300 MG capsule Take 3 capsules (900 mg total) by mouth 3 (three) times daily.   glimepiride (AMARYL) 4 MG tablet Take 1/2 tablet by mouth before biggest meal of the day.   levalbuterol (XOPENEX HFA) 45 MCG/ACT inhaler Inhale 2 puffs into the lungs daily as needed for wheezing or shortness of breath.   loratadine (CLARITIN) 10 MG tablet Take 10 mg by mouth daily.   metFORMIN (GLUCOPHAGE) 1000 MG tablet Take 1 tablet (1,000 mg total) by mouth 2 (two) times daily.   pantoprazole (PROTONIX) 40 MG tablet Take 1 tablet (40 mg  total) by mouth daily.   tamsulosin (FLOMAX) 0.4 MG CAPS capsule TAKE 1 CAPSULE (0.4 MG TOTAL) BY MOUTH DAILY.   traMADol (ULTRAM) 50 MG tablet Take 1 tablet (50 mg total) by mouth every 8 (eight) hours as needed.   traZODone (DESYREL) 50 MG tablet Take 1/2-1 tablet by mouth at bedtime as needed for sleep.   benzonatate (TESSALON) 200 MG capsule Take 1 capsule by mouth 2 times daily as needed for cough. (Patient not taking: Reported on 01/03/2022)   nitroGLYCERIN (NITROSTAT) 0.4 MG SL tablet Place 1 tablet (0.4 mg total) under the tongue every 5 (five) minutes as needed for chest pain.  (Patient not taking: Reported on 12/29/2021)   tiotropium (SPIRIVA HANDIHALER) 18 MCG inhalation capsule PLACE ONE CAPSULE IN THE INHALER AND INHALE ONCE DAILY (Patient not taking: Reported on 01/03/2022)   [DISCONTINUED] oxyCODONE-acetaminophen (PERCOCET) 10-325 MG tablet Take 1 tablet by mouth every 4 (four) hours as needed for pain.   [DISCONTINUED] terbinafine (LAMISIL) 250 MG tablet Take 1 tablet (250 mg total) by mouth daily.   No facility-administered encounter medications on file as of 01/03/2022.    Allergies (verified) Patient has no known allergies.   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, in remission-chest, but all over   Memory loss    mild - r/t chemo, 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   Neuromuscular disorder ([place])    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   Sleep apnea    does not use anything for OSA   Past Surgical History:  Procedure Laterality Date   BACK SURGERY     L4-L5 bracket and screws   BICEPS TENDON REPAIR Left    CARPAL TUNNEL RELEASE     COLONOSCOPY     CORONARY BALLOON ANGIOPLASTY N/A 06/10/2021   Procedure: CORONARY BALLOON ANGIOPLASTY;  Surgeon: Jettie Booze, MD;  Location: [facility];  Service: Cardiovascular;  Laterality: N/A;   CORONARY STENT INTERVENTION N/A 06/10/2021   Procedure: CORONARY STENT INTERVENTION;  Surgeon: Jettie Booze, MD;  Location: [facility];  Service:  Cardiovascular;  Laterality: N/A;   FINGER SURGERY     INTRAVASCULAR ULTRASOUND/IVUS N/A 06/10/2021   Procedure: Intravascular Ultrasound/IVUS;  Surgeon: Jettie Booze, MD;  Location: [facility];  Service: Cardiovascular;  Laterality: N/A;   KNEE ARTHROSCOPY     ACL tear   LEFT HEART CATH AND CORONARY ANGIOGRAPHY N/A 06/10/2021   Procedure: LEFT HEART CATH AND CORONARY ANGIOGRAPHY;  Surgeon: Jettie Booze, MD;  Location: [facility];  Service: Cardiovascular;  Laterality: N/A;   LEFT HEART CATHETERIZATION WITH CORONARY ANGIOGRAM N/A 12/07/2010   Procedure: LEFT HEART CATHETERIZATION WITH CORONARY ANGIOGRAM;  Surgeon: Leonie Man, MD;  Location: [facility];  Service: Cardiovascular;  Laterality: N/A;   LUMBAR LAMINECTOMY/DECOMPRESSION MICRODISCECTOMY Right 12/09/2020   Procedure: MICRODISCECTOMY, RIGHT LUMBAR TWO-THREE, LUMBAR THREE-FOUR;  Surgeon: Newman Pies, MD;  Location: [facility];  Service: Neurosurgery;  Laterality: Right;   PILONIDAL CYST EXCISION     PORT-A-CATH REMOVAL N/A 11/09/2017   Procedure: REMOVAL PORT-A-CATH;  Surgeon: [name], MD;  Location: [facility];  Service: General;  Laterality: N/A;  local   PORTACATH PLACEMENT Left 02/27/2017   Procedure: INSERTION PORT-A-CATH;  Surgeon: [name], MD;  Location: [facility];  Service: General;  Laterality: Left;   UPPER GASTROINTESTINAL ENDOSCOPY  05/16/2006   VIDEO BRONCHOSCOPY WITH ENDOBRONCHIAL ULTRASOUND N/A 01/24/2017   Procedure: VIDEO BRONCHOSCOPY WITH ENDOBRONCHIAL ULTRASOUND with TRANSBRONCHIAL BIOPSY;  Surgeon: Grace Isaac, MD;  Location: [facility];  Service: Thoracic;  Laterality: N/A;   VIDEO MEDIASTINOSCOPY N/A 02/05/2017   Procedure: VIDEO MEDIASTINOSCOPY;  Surgeon: Grace Isaac, MD;  Location: [facility];  Service: Thoracic;  Laterality: N/A;   Family History  Problem Relation Age of Onset   Emphysema Mother    Asthma Mother    Heart disease Father     Social History   Socioeconomic History   Marital status: Married    Spouse name: Not on file   Number of children: 2   Years of education: Not on file   Highest education level: Not on file  Occupational History   Occupation: retired  Tobacco Use   Smoking status: Former    Packs/day: 3.00    Years: 20.00    Total pack years: 60.00    Types: Cigarettes    Quit date: 01/31/1988    Years since quitting: 33.9   Smokeless tobacco: Never  Vaping Use   Vaping Use: Never used  Substance and Sexual Activity   Alcohol use: No   Drug use: No   Sexual activity: Yes    Partners: Female    Birth control/protection: None  Other Topics Concern   Not on file  Social History Narrative   1 grand and 1 due 08/2021   Social Determinants of Health   Financial Resource Strain: Low Risk  (01/03/2022)   Overall Financial Resource Strain (CARDIA)    Difficulty of Paying Living Expenses: Not hard at all  Food Insecurity: No Food Insecurity (01/03/2022)   Hunger Vital Sign    Worried About Running Out of Food in the Last Year: Never true    [place] in the Last Year: Never true  Transportation Needs: No Transportation Needs (01/03/2022)   PRAPARE - Hydrologist (Medical): No    Lack of Transportation (Non-Medical): No  Physical Activity: Inactive (01/03/2022)   Exercise Vital Sign    Days of Exercise per Week: 0 days    Minutes of Exercise per Session: 0 min  Stress: No Stress Concern Present (01/03/2022)   [place]    Feeling of Stress : Not at all  Social Connections: Moderately Isolated (01/03/2022)   Social Connection and Isolation Panel [NHANES]    Frequency of Communication with Friends and Family: More than three times a week    Frequency of Social Gatherings with Friends and Family: More than three times a week    Attends Religious Services: Never    Marine scientist or  Organizations: No    Attends Archivist Meetings: Never    Marital Status: Married  Tobacco Counseling Counseling given: Not Answered   Clinical Intake:  Pre-visit preparation completed: Yes  Pain : No/denies pain     BMI - recorded: 33.69 Nutritional Status: BMI > 30  Obese Nutritional Risks: None Diabetes: Yes CBG done?: No Did pt. bring in CBG monitor from home?: No  How often do you need to have someone help you when you read instructions, pamphlets, or other written materials from your doctor or pharmacy?: 1 - Never  Diabetic?Nutrition Risk Assessment:  Has the patient had any N/V/D within the last 2 months?  Yes loose stools at times  Does the patient have any non-healing wounds?  No  Has the patient had any unintentional weight loss or weight gain?  No   Diabetes:  Is the patient diabetic?  Yes  If diabetic, was a CBG obtained today?  No  Did the patient bring in their glucometer from home?  No  How often do you monitor your CBG's? N/A.   Financial Strains and Diabetes Management:  Are you having any financial strains with the device, your supplies or your medication? No .  Does the patient want to be seen by Chronic Care Management for management of their diabetes?  No  Would the patient like to be referred to a Nutritionist or for Diabetic Management?  No   Diabetic Exams:  Diabetic Eye Exam: Completed 03/29/21 Diabetic Foot Exam: Overdue, Pt has been advised about the importance in completing this exam. Pt is scheduled for diabetic foot exam on next appt .   Interpreter Needed?: No  Information entered by :: Charlott Rakes, LPN   Activities of Daily Living    01/03/2022    8:09 AM 01/02/2022    8:00 PM  In your present state of health, do you have any difficulty performing the following activities:  Hearing? 1 0  Comment right ear HOH   Vision? 0 0  Difficulty concentrating or making decisions? 0 1  Walking or climbing stairs? 0 1   Dressing or bathing? 0 0  Doing errands, shopping? 0 0  Preparing Food and eating ? N N  Using the Toilet? N N  In the past six months, have you accidently leaked urine? N Y  Do you have problems with loss of bowel control? N Y  Managing your Medications? N N  Managing your Finances? N N  Housekeeping or managing your Housekeeping? N N    Patient Care Team: [name], MD as PCP - General (Family Medicine) Fay Records, MD as PCP - Cardiology (Cardiology) Marica Otter, OD (Optometry)  Indicate any recent Medical Services you may have received from other than [facility] providers in the past year (date may be approximate).     Assessment:   This is a routine wellness examination for [place].  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  Vision Screening - Comments:: Pt follows up with [facility] for annul eye exams   Dietary issues and exercise activities discussed: Current Exercise Habits: The patient does not participate in regular exercise at present   Goals Addressed             This Visit's Progress    Patient Stated       Stay active        Depression Screen    01/03/2022    8:00 AM 11/10/2021    7:48 AM 08/29/2021    8:41 AM 09/07/2020    2:59 PM 12/16/2019  8:11 AM 12/02/2018    8:03 AM 10/11/2018    2:01 PM  PHQ 2/9 Scores  PHQ - 2 Score 1 0 0 0 0 0 0  PHQ- 9 Score      4 4    Fall Risk    01/03/2022    8:08 AM 01/02/2022    8:00 PM 11/10/2021    7:48 AM 08/29/2021    8:41 AM 07/12/2021    9:04 AM  Fall Risk   Falls in the past year? 1 1 0 0 1  Number falls in past yr: 1 1 0 0 1  Injury with Fall? 0 0 0 0 0  Risk for fall due to : Impaired vision;Impaired balance/gait  No Fall Risks No Fall Risks Impaired mobility;Impaired balance/gait  Follow up Falls prevention discussed    Falls evaluation completed    FALL RISK PREVENTION PERTAINING TO THE HOME:  Any stairs in or around the home? Yes  If so, are there  any without handrails? Yes  Home free of loose throw rugs in walkways, pet beds, electrical cords, etc? Yes  Adequate lighting in your home to reduce risk of falls? Yes   ASSISTIVE DEVICES UTILIZED TO PREVENT FALLS:  Life alert? No  Use of a cane, walker or w/c? No  Grab bars in the bathroom? No  Shower chair or bench in shower? No  Elevated toilet seat or a handicapped toilet? No   TIMED UP AND GO:  Was the test performed? Yes .  Length of time to ambulate 10 feet: 10 sec.   Gait steady and fast without use of assistive device  Cognitive Function:        01/03/2022    8:10 AM  6CIT Screen  What Year? 0 points  What month? 0 points  What time? 0 points  Count back from 20 0 points  Months in reverse 0 points  Repeat phrase 2 points  Total Score 2 points    Immunizations Immunization History  Administered Date(s) Administered   Fluad Quad(high Dose 65+) 11/10/2021   Influenza,inj,Quad PF,6+ Mos 10/11/2018, 12/16/2019, 11/08/2020   Influenza-Unspecified 11/14/2016, 10/30/2017   Janssen (J&J) SARS-COV-2 Vaccination 05/08/2019, 12/03/2019   PNEUMOCOCCAL CONJUGATE-20 04/04/2021   Pneumococcal Conjugate-13 11/27/2017   Tdap 10/11/2018   Zoster Recombinat (Shingrix) 04/04/2021, 06/01/2021    TDAP status: Up to date  Flu Vaccine status: Up to date  Pneumococcal vaccine status: Up to date  Covid-19 vaccine status: Completed vaccines  Qualifies for Shingles Vaccine? Yes   Zostavax completed Yes   Shingrix Completed?: Yes  Screening Tests Health Maintenance  Topic Date Due   FOOT EXAM  Never done   Diabetic kidney evaluation - Urine ACR  Never done   OPHTHALMOLOGY EXAM  03/29/2022   HEMOGLOBIN A1C  05/12/2022   Diabetic kidney evaluation - GFR measurement  11/11/2022   Medicare Annual Wellness (AWV)  01/04/2023   COLONOSCOPY (Pts 45-76yr Insurance coverage will need to be confirmed)  09/11/2027   DTaP/Tdap/Td (2 - Td or Tdap) 10/10/2028   Pneumonia Vaccine  66 Years old  Completed   INFLUENZA VACCINE  Completed   Hepatitis C Screening  Completed   HIV Screening  Completed   Zoster Vaccines- Shingrix  Completed   HPV VACCINES  Aged Out   COVID-19 Vaccine  Discontinued    Health Maintenance  Health Maintenance Due  Topic Date Due   FOOT EXAM  Never done   Diabetic kidney evaluation - Urine ACR  Never  done    Colorectal cancer screening: Type of screening: Colonoscopy. Completed 09/10/17. Repeat every 10 years   Additional Screening:  Hepatitis C Screening:  Completed 02/07/17  Vision Screening: Recommended annual ophthalmology exams for early detection of glaucoma and other disorders of the eye. Is the patient up to date with their annual eye exam?  Yes  Who is the provider or what is the name of the office in which the patient attends annual eye exams? [place]  If pt is not established with a provider, would they like to be referred to a provider to establish care? No .   Dental Screening: Recommended annual dental exams for proper oral hygiene  Community Resource Referral / Chronic Care Management: CRR required this visit?  No   CCM required this visit?  No      Plan:     I have personally reviewed and noted the following in the patient's chart:   Medical and social history Use of alcohol, tobacco or illicit drugs  Current medications and supplements including opioid prescriptions. Patient is currently taking opioid prescriptions. Information provided to patient regarding non-opioid alternatives. Patient advised to discuss non-opioid treatment plan with their provider. Functional ability and status Nutritional status Physical activity Advanced directives List of other physicians Hospitalizations, surgeries, and ER visits in previous 12 months Vitals Screenings to include cognitive, depression, and falls Referrals and appointments  In addition, I have reviewed and discussed with patient certain preventive  protocols, quality metrics, and best practice recommendations. A written personalized care plan for preventive services as well as general preventive health recommendations were provided to patient.     Willette Brace, LPN   00/09/3816   Nurse Notes: none

## 2022-01-03 NOTE — Telephone Encounter (Signed)
Patient would like memory medication - would like to hear Dr Ellwood Handler recommendation for something that works. Patient would like a call back.

## 2022-01-03 NOTE — Patient Instructions (Signed)
[name] , Thank you for taking time to come for your Medicare Wellness Visit. I appreciate your ongoing commitment to your health goals. Please review the following plan we discussed and let me know if I can assist you in the future.   These are the goals we discussed:  Goals   None     This is a list of the screening recommended for you and due dates:  Health Maintenance  Topic Date Due   Complete foot exam   Never done   Yearly kidney health urinalysis for diabetes  Never done   Eye exam for diabetics  03/29/2022   Hemoglobin A1C  05/12/2022   Yearly kidney function blood test for diabetes  11/11/2022   Medicare Annual Wellness Visit  01/04/2023   Colon Cancer Screening  09/11/2027   DTaP/Tdap/Td vaccine (2 - Td or Tdap) 10/10/2028   Pneumonia Vaccine  Completed   Flu Shot  Completed   Hepatitis C Screening: USPSTF Recommendation to screen - Ages 53-79 yo.  Completed   HIV Screening  Completed   Zoster (Shingles) Vaccine  Completed   HPV Vaccine  Aged Out   COVID-19 Vaccine  Discontinued    Advanced directives: Please bring a copy of your health care power of attorney and living will to the office at your convenience.  Conditions/risks identified: stay active   Next appointment: Follow up in one year for your annual wellness visit.   Preventive Care 66 Years and Older, Male  Preventive care refers to lifestyle choices and visits with your health care provider that can promote health and wellness. What does preventive care include? A yearly physical exam. This is also called an annual well check. Dental exams once or twice a year. Routine eye exams. Ask your health care provider how often you should have your eyes checked. Personal lifestyle choices, including: Daily care of your teeth and gums. Regular physical activity. Eating a healthy diet. Avoiding tobacco and drug use. Limiting alcohol use. Practicing safe sex. Taking low doses of aspirin every day. Taking  vitamin and mineral supplements as recommended by your health care provider. What happens during an annual well check? The services and screenings done by your health care provider during your annual well check will depend on your age, overall health, lifestyle risk factors, and family history of disease. Counseling  Your health care provider may ask you questions about your: Alcohol use. Tobacco use. Drug use. Emotional well-being. Home and relationship well-being. Sexual activity. Eating habits. History of falls. Memory and ability to understand (cognition). Work and work Statistician. Screening  You may have the following tests or measurements: Height, weight, and BMI. Blood pressure. Lipid and cholesterol levels. These may be checked every 5 years, or more frequently if you are over 85 years old. Skin check. Lung cancer screening. You may have this screening every year starting at age 78 if you have a 30-pack-year history of smoking and currently smoke or have quit within the past 15 years. Fecal occult blood test (FOBT) of the stool. You may have this test every year starting at age 42. Flexible sigmoidoscopy or colonoscopy. You may have a sigmoidoscopy every 5 years or a colonoscopy every 10 years starting at age 18. Prostate cancer screening. Recommendations will vary depending on your family history and other risks. Hepatitis C blood test. Hepatitis B blood test. Sexually transmitted disease (STD) testing. Diabetes screening. This is done by checking your blood sugar (glucose) after you have not eaten for  a while (fasting). You may have this done every 1-3 years. Abdominal aortic aneurysm (AAA) screening. You may need this if you are a current or former smoker. Osteoporosis. You may be screened starting at age 71 if you are at high risk. Talk with your health care provider about your test results, treatment options, and if necessary, the need for more tests. Vaccines  Your  health care provider may recommend certain vaccines, such as: Influenza vaccine. This is recommended every year. Tetanus, diphtheria, and acellular pertussis (Tdap, Td) vaccine. You may need a Td booster every 10 years. Zoster vaccine. You may need this after age 71. Pneumococcal 13-valent conjugate (PCV13) vaccine. One dose is recommended after age 30. Pneumococcal polysaccharide (PPSV23) vaccine. One dose is recommended after age 21. Talk to your health care provider about which screenings and vaccines you need and how often you need them. This information is not intended to replace advice given to you by your health care provider. Make sure you discuss any questions you have with your health care provider. Document Released: 02/12/2015 Document Revised: 10/06/2015 Document Reviewed: 11/17/2014 Elsevier Interactive Patient Education  2017 [place] Prevention in the Home Falls can cause injuries. They can happen to people of all ages. There are many things you can do to make your home safe and to help prevent falls. What can I do on the outside of my home? Regularly fix the edges of walkways and driveways and fix any cracks. Remove anything that might make you trip as you walk through a door, such as a raised step or threshold. Trim any bushes or trees on the path to your home. Use bright outdoor lighting. Clear any walking paths of anything that might make someone trip, such as rocks or tools. Regularly check to see if handrails are loose or broken. Make sure that both sides of any steps have handrails. Any raised decks and porches should have guardrails on the edges. Have any leaves, snow, or ice cleared regularly. Use sand or salt on walking paths during winter. Clean up any spills in your garage right away. This includes oil or grease spills. What can I do in the bathroom? Use night lights. Install grab bars by the toilet and in the tub and shower. Do not use towel bars as  grab bars. Use non-skid mats or decals in the tub or shower. If you need to sit down in the shower, use a plastic, non-slip stool. Keep the floor dry. Clean up any water that spills on the floor as soon as it happens. Remove soap buildup in the tub or shower regularly. Attach bath mats securely with double-sided non-slip rug tape. Do not have throw rugs and other things on the floor that can make you trip. What can I do in the bedroom? Use night lights. Make sure that you have a light by your bed that is easy to reach. Do not use any sheets or blankets that are too big for your bed. They should not hang down onto the floor. Have a firm chair that has side arms. You can use this for support while you get dressed. Do not have throw rugs and other things on the floor that can make you trip. What can I do in the kitchen? Clean up any spills right away. Avoid walking on wet floors. Keep items that you use a lot in easy-to-reach places. If you need to reach something above you, use a strong step stool that has a  grab bar. Keep electrical cords out of the way. Do not use floor polish or wax that makes floors slippery. If you must use wax, use non-skid floor wax. Do not have throw rugs and other things on the floor that can make you trip. What can I do with my stairs? Do not leave any items on the stairs. Make sure that there are handrails on both sides of the stairs and use them. Fix handrails that are broken or loose. Make sure that handrails are as long as the stairways. Check any carpeting to make sure that it is firmly attached to the stairs. Fix any carpet that is loose or worn. Avoid having throw rugs at the top or bottom of the stairs. If you do have throw rugs, attach them to the floor with carpet tape. Make sure that you have a light switch at the top of the stairs and the bottom of the stairs. If you do not have them, ask someone to add them for you. What else can I do to help prevent  falls? Wear shoes that: Do not have high heels. Have rubber bottoms. Are comfortable and fit you well. Are closed at the toe. Do not wear sandals. If you use a stepladder: Make sure that it is fully opened. Do not climb a closed stepladder. Make sure that both sides of the stepladder are locked into place. Ask someone to hold it for you, if possible. Clearly mark and make sure that you can see: Any grab bars or handrails. First and last steps. Where the edge of each step is. Use tools that help you move around (mobility aids) if they are needed. These include: Canes. Walkers. Scooters. Crutches. Turn on the lights when you go into a dark area. Replace any light bulbs as soon as they burn out. Set up your furniture so you have a clear path. Avoid moving your furniture around. If any of your floors are uneven, fix them. If there are any pets around you, be aware of where they are. Review your medicines with your doctor. Some medicines can make you feel dizzy. This can increase your chance of falling. Ask your doctor what other things that you can do to help prevent falls. This information is not intended to replace advice given to you by your health care provider. Make sure you discuss any questions you have with your health care provider. Document Released: 11/12/2008 Document Revised: 06/24/2015 Document Reviewed: 02/20/2014 Elsevier Interactive Patient Education  2017 [employer].

## 2022-01-04 NOTE — Telephone Encounter (Signed)
No evidence that any of the over-the-counter medications or supplements are effective or beneficial for memory loss.  Recommend he schedule appointment to discuss further testing and possible prescription medications.

## 2022-01-06 ENCOUNTER — Encounter: Payer: Self-pay | Admitting: Family Medicine

## 2022-01-06 ENCOUNTER — Ambulatory Visit (INDEPENDENT_AMBULATORY_CARE_PROVIDER_SITE_OTHER): Payer: Medicare PPO | Admitting: Family Medicine

## 2022-01-06 VITALS — BP 122/78 | HR 71 | Temp 97.7°F | Ht 72.0 in | Wt 248.4 lb

## 2022-01-06 DIAGNOSIS — K5903 Drug induced constipation: Secondary | ICD-10-CM

## 2022-01-06 DIAGNOSIS — M2010 Hallux valgus (acquired), unspecified foot: Secondary | ICD-10-CM | POA: Diagnosis not present

## 2022-01-06 DIAGNOSIS — W19XXXS Unspecified fall, sequela: Secondary | ICD-10-CM | POA: Diagnosis not present

## 2022-01-06 DIAGNOSIS — R739 Hyperglycemia, unspecified: Secondary | ICD-10-CM | POA: Diagnosis not present

## 2022-01-06 DIAGNOSIS — Z79899 Other long term (current) drug therapy: Secondary | ICD-10-CM | POA: Diagnosis not present

## 2022-01-06 DIAGNOSIS — R413 Other amnesia: Secondary | ICD-10-CM

## 2022-01-06 DIAGNOSIS — I152 Hypertension secondary to endocrine disorders: Secondary | ICD-10-CM | POA: Diagnosis not present

## 2022-01-06 DIAGNOSIS — J449 Chronic obstructive pulmonary disease, unspecified: Secondary | ICD-10-CM

## 2022-01-06 DIAGNOSIS — E1159 Type 2 diabetes mellitus with other circulatory complications: Secondary | ICD-10-CM | POA: Diagnosis not present

## 2022-01-06 LAB — CBC
HCT: 43.8 % (ref 39.0–52.0)
Hemoglobin: 14.9 g/dL (ref 13.0–17.0)
MCHC: 34 g/dL (ref 30.0–36.0)
MCV: 89.1 fl (ref 78.0–100.0)
Platelets: 240 10*3/uL (ref 150.0–400.0)
RBC: 4.91 Mil/uL (ref 4.22–5.81)
RDW: 14.1 % (ref 11.5–15.5)
WBC: 7.3 10*3/uL (ref 4.0–10.5)

## 2022-01-06 LAB — COMPREHENSIVE METABOLIC PANEL
ALT: 13 U/L (ref 0–53)
AST: 16 U/L (ref 0–37)
Albumin: 4.1 g/dL (ref 3.5–5.2)
Alkaline Phosphatase: 71 U/L (ref 39–117)
BUN: 15 mg/dL (ref 6–23)
CO2: 29 mEq/L (ref 19–32)
Calcium: 9.3 mg/dL (ref 8.4–10.5)
Chloride: 101 mEq/L (ref 96–112)
Creatinine, Ser: 0.72 mg/dL (ref 0.40–1.50)
GFR: 95.59 mL/min (ref >60.00)
Glucose, Bld: 94 mg/dL (ref 70–99)
Potassium: 4.4 mEq/L (ref 3.5–5.1)
Sodium: 138 mEq/L (ref 135–145)
Total Bilirubin: 0.7 mg/dL (ref 0.2–1.2)
Total Protein: 6.3 g/dL (ref 6.0–8.3)

## 2022-01-06 LAB — TSH: TSH: 0.87 u[IU]/mL (ref 0.35–5.50)

## 2022-01-06 LAB — VITAMIN D 25 HYDROXY (VIT D DEFICIENCY, FRACTURES): VITD: 30.51 ng/mL (ref 30.00–100.00)

## 2022-01-06 LAB — FOLATE: Folate: 7.8 ng/mL (ref >5.9)

## 2022-01-06 LAB — VITAMIN B12: Vitamin B-12: 319 pg/mL (ref 211–911)

## 2022-01-06 NOTE — Assessment & Plan Note (Signed)
Recommended stool softeners and MiraLAX daily as needed to have 1-2 soft bowel movements daily.

## 2022-01-06 NOTE — Assessment & Plan Note (Signed)
Follows with pulmonology.  Recently switched to Trelegy.  He has not noticed if this is a manage of a difference as of yet.

## 2022-01-06 NOTE — Progress Notes (Signed)
Jeffery Wood is a 66 y.o. male who presents today for an office visit.  Assessment/Plan:  Leg Pain / Fall  Likely had quad strain or contusion secondary to fall. Reassuring exam today. Given that pain in his legs seems to be improving do not think we need to get any imaging at this time. He will let me know if symptoms do not continue to improve over the next few days.  He can continue over-the-counter meds.  Fall was mechanical in nature. 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 We discussed referral to PT however he declined.  Chronic Problems Addressed Today: Memory impairment Patient with 2 out of 3 delayed word recall.  Likely sequela of his chemotherapy that we will check labs to rule out other causes.  Check B12, folate, TSH, CBC, c-Met.  We did discuss referral to neurology however he declined for now.  Will consider starting low-dose Aricept depending on results of labs.  We did discuss side effects this medication if we were to start it.  Hypertension associated with diabetes ([place]) Blood pressure at goal today off meds.  Hallux valgus Follows with podiatry.  COPD mixed type [facility]) Follows with pulmonology.  Recently switched to Trelegy.  He has not noticed if this is a manage of a difference as of yet.  Constipation Recommended stool softeners and MiraLAX daily as needed to have 1-2 soft bowel movements daily.  Hyperglycemia Last A1c 5.9.     Subjective:  HPI:  See A/p for status of chronic conditions.  His main concern today is memory impairment. 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 This has stayed about the same for the last several months to years.  Having more issues with short-term memory.  Long-term memory does not seem to be as affected.  For example he will be working at home and go to the tool box to grab a tool however forget what he is going to get all the way there.  He did fall a few  days ago. Was working on remodeling his bathroom. Did not lose consciousness but missed a step when going down the stairs and fell about 2-3 feet and landed on air tank. He had minimal amount of pain initially but over the last couple of days has noticed persistent pain in his thighs. Pain seems to improving over the last day or so.        Objective:  Physical Exam: BP 122/78   Pulse 71   Temp 97.7 F (36.5 C) (Temporal)   Ht 6' (1.829 m)   Wt 248 lb 6.4 oz (112.7 kg)   SpO2 95%   BMI 33.69 kg/m   Gen: No acute distress, resting comfortably CV: Regular rate and rhythm with no murmurs appreciated Pulm: Normal work of breathing, clear to auscultation bilaterally with no crackles, wheezes, or rhonchi MSK: - Legs: No deformities.  Bilateral quadriceps tender to palpation.  Neurovascular intact distally.  Reflexes 2+ and symmetric bilaterally.  Sensation light touch intact throughout both lower extremities.  Strength 5 out of 5 in bilateral quads. Neuro: Grossly normal, moves all extremities. Mini cog: Normal clock face, 2/3 delayed word recall Psych: Normal affect and thought content  Time Spent: 44 minutes of total time was spent on the date of the encounter performing the following actions: chart review prior to seeing the patient, obtaining history, performing a medically necessary exam including mini cog, counseling  on the treatment plan, placing orders, and documenting in our EHR.        Algis Greenhouse. Jerline Pain, MD 01/06/2022 8:21 AM

## 2022-01-06 NOTE — Patient Instructions (Addendum)
It was very nice to see you today!  We will check blood work today.  We may start a medication depending on results for blood work.  You probably had a small contusion in your legs.  Let us know if this does not continue to improve.  Take care, Dr Jerline Pain  PLEASE NOTE:  If you had any lab tests please let us know if you have not heard back within a few days. You may see your results on mychart before we have a chance to review them but we will give you a call once they are reviewed by [place]. If we ordered any referrals today, please let us know if you have not heard from their office within the next week.   Please try these tips to maintain a healthy lifestyle:  Eat at least 3 REAL meals and 1-2 snacks per day.  Aim for no more than 5 hours between eating.  If you eat breakfast, please do so within one hour of getting up.   Each meal should contain half fruits/vegetables, one quarter protein, and one quarter carbs (no bigger than a computer mouse)  Cut down on sweet beverages. This includes juice, soda, and sweet tea.   Drink at least 1 glass of water with each meal and aim for at least 8 glasses per day  Exercise at least 150 minutes every week.

## 2022-01-06 NOTE — Assessment & Plan Note (Signed)
Patient with 2 out of 3 delayed word recall.  Likely sequela of his chemotherapy that we will check labs to rule out other causes.  Check B12, folate, TSH, CBC, c-Met.  We did discuss referral to neurology however he declined for now.  Will consider starting low-dose Aricept depending on results of labs.  We did discuss side effects this medication if we were to start it.

## 2022-01-06 NOTE — Assessment & Plan Note (Signed)
Follows with podiatry.

## 2022-01-06 NOTE — Assessment & Plan Note (Signed)
Blood pressure at goal today off meds.

## 2022-01-06 NOTE — Assessment & Plan Note (Signed)
Last A1c 5.9.

## 2022-01-10 ENCOUNTER — Encounter: Payer: Self-pay | Admitting: Family Medicine

## 2022-01-10 NOTE — Progress Notes (Signed)
Please inform patient of the following:  All of his labs are normal. No obvious abnormalities that would explain his memory issues.  Is okay for [place] to start Aricept if he is agreeable.  Okay to send in Aricept 5 mg once daily.  Would like for him to follow-up with me in a few weeks to let me know how this is going.  He should let us know if he has any significant side effects.  Do not need to make any other changes to his treatment plan at this time.

## 2022-01-11 ENCOUNTER — Other Ambulatory Visit: Payer: Self-pay

## 2022-01-11 ENCOUNTER — Other Ambulatory Visit: Payer: Self-pay | Admitting: *Deleted

## 2022-01-11 ENCOUNTER — Other Ambulatory Visit (HOSPITAL_COMMUNITY): Payer: Self-pay

## 2022-01-11 MED ORDER — DONEPEZIL HCL 5 MG PO TABS
5.0000 mg | ORAL_TABLET | Freq: Every day | ORAL | 1 refills | Status: DC
  Filled 2022-01-11: qty 30, 30d supply, fill #0
  Filled 2022-02-04: qty 30, 30d supply, fill #1

## 2022-01-11 NOTE — Telephone Encounter (Signed)
Spoke with patient Ok to take supplement per Dr Jerline Pain

## 2022-01-13 ENCOUNTER — Other Ambulatory Visit: Payer: Self-pay

## 2022-01-20 ENCOUNTER — Other Ambulatory Visit (HOSPITAL_COMMUNITY): Payer: Self-pay

## 2022-01-25 ENCOUNTER — Telehealth: Payer: Self-pay | Admitting: *Deleted

## 2022-01-25 ENCOUNTER — Telehealth: Payer: Self-pay | Admitting: Internal Medicine

## 2022-01-25 NOTE — Telephone Encounter (Signed)
Pt c/o of Chest Pain: STAT if CP now or developed within 24 hours  1. Are you having CP right now?  [name] is not currently with patient   2. Are you experiencing any other symptoms (ex. SOB, nausea, vomiting, sweating)?  Per [name], patient reported mild night sweats--also mentions use of heating pad on feet No additional symptoms reported  3. How long have you been experiencing CP?  Patient describes the feeling as chest discomfort rather than pain. Discomfort went away for several weeks after stent placement. Now recurring   4. Is your CP continuous or coming and going?  Coming and going, exertional only   5. Have you taken Nitroglycerin?  Per [name], patient has nitro but does not take it  Dr. Benay Spice assumes symptoms are not related to lymphoma and would like to further discuss with Dr. Harrington Challenger if at all possible. Per [name], call may be returned to their provider line at [phone]. ?

## 2022-01-25 NOTE — Telephone Encounter (Signed)
Reports mid chest pain that was resolved for ~ 2 weeks with cardiac stents. Then having chest discomfort again and cardiology sent him to pulmonary, who put him on Trelogy. Still having chest pain with exertion at site where he reports his lymphoma mass was found in past. Informed him that his 12/01 CXR did not reveal a mass-only peribronchial thickening, which is excess fluid or mucous. Wants to see Dr. Benay Spice about this issue now to determine his thoughts.

## 2022-01-26 ENCOUNTER — Telehealth: Payer: Self-pay | Admitting: *Deleted

## 2022-01-26 NOTE — Telephone Encounter (Signed)
Called telephone number for Dr. Gearldine Shown office [phone] rings busy.  Called X2.

## 2022-01-26 NOTE — Telephone Encounter (Signed)
Spoke w/nurse Latanya Presser at cardiology group and was provided an appointment for Jeffery Wood for 12/29 at 2:45 with Ambrose Pancoast, NP. Will need to arrive at 2:30 pm. Notified Mr. Spikes and made him aware that Dr. Benay Spice does not believe this is lymphoma related. He agrees to appointment w/cardiology.

## 2022-01-26 NOTE — Progress Notes (Signed)
Office Visit    Patient Name: Jeffery Wood Date of Encounter: 01/26/2022  Primary Care Provider:  [name], MD Primary Cardiologist:  [name], MD Primary Electrophysiologist: None  Chief Complaint    Jeffery Wood is a 66 y.o. male with PMH of CAD s/p DES to mid LAD, distal LAD, first diagonal ostium, large cell lymphoma, COPD, HTN, DM type II who presents today for complaint of chest pain.  Past Medical History    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    CAD (coronary artery disease) 2023   5v   Cancer ([place])    basal cell of right temple; carcinoids   Complication of anesthesia    hard to wake up after bronchoscopy   Diabetes ([place])    type 2   Dyspnea    GERD (gastroesophageal reflux disease)    Gout    resolved, no current problem   Headache    History of hiatal hernia 05/16/2006   small noted on EGD   Hypertension    hx   Lung mass    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[place])    last chemo 05/2017, in remission-chest, but all over   Memory loss    mild - r/t chemo, 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   Neuromuscular disorder ([place])    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   Sleep apnea    does not use anything for OSA   Past Surgical History:  Procedure Laterality Date   BACK SURGERY     L4-L5 bracket and screws   BICEPS TENDON REPAIR Left    CARPAL TUNNEL RELEASE     COLONOSCOPY     CORONARY BALLOON ANGIOPLASTY N/A 06/10/2021   Procedure: CORONARY BALLOON ANGIOPLASTY;  Surgeon: Jettie Booze, MD;  Location: [place] [facility];  Service: Cardiovascular;  Laterality: N/A;   CORONARY STENT INTERVENTION N/A 06/10/2021   Procedure: CORONARY STENT INTERVENTION;  Surgeon: Jettie Booze, MD;  Location: [place] [facility];  Service: Cardiovascular;  Laterality: N/A;   FINGER SURGERY     INTRAVASCULAR ULTRASOUND/IVUS N/A 06/10/2021   Procedure: Intravascular Ultrasound/IVUS;   Surgeon: Jettie Booze, MD;  Location: [place];  Service: Cardiovascular;  Laterality: N/A;   KNEE ARTHROSCOPY     ACL tear   LEFT HEART CATH AND CORONARY ANGIOGRAPHY N/A 06/10/2021   Procedure: LEFT HEART CATH AND CORONARY ANGIOGRAPHY;  Surgeon: Jettie Booze, MD;  Location: [place] [facility];  Service: Cardiovascular;  Laterality: N/A;   LEFT HEART CATHETERIZATION WITH CORONARY ANGIOGRAM N/A 12/07/2010   Procedure: LEFT HEART CATHETERIZATION WITH CORONARY ANGIOGRAM;  Surgeon: Leonie Man, MD;  Location[facility];  Service: Cardiovascular;  Laterality: N/A;   LUMBAR LAMINECTOMY/DECOMPRESSION MICRODISCECTOMY Right 12/09/2020   Procedure: MICRODISCECTOMY, RIGHT LUMBAR TWO-THREE, LUMBAR THREE-FOUR;  Surgeon: Newman Pies, MD;  Location: [place];  Service: Neurosurgery;  Laterality: Right;   PILONIDAL CYST EXCISION     PORT-A-CATH REMOVAL N/A 11/09/2017   Procedure: REMOVAL PORT-A-CATH;  Surgeon: [name], MD;  Location: [place];  Service: General;  Laterality: N/A;  local   PORTACATH PLACEMENT Left 02/27/2017   Procedure: INSERTION PORT-A-CATH;  Surgeon: [name], MD;  Location: [facility];  Service: General;  Laterality: Left;   UPPER GASTROINTESTINAL ENDOSCOPY  05/16/2006   VIDEO BRONCHOSCOPY WITH ENDOBRONCHIAL ULTRASOUND N/A 01/24/2017  Procedure: VIDEO BRONCHOSCOPY WITH ENDOBRONCHIAL ULTRASOUND with TRANSBRONCHIAL BIOPSY;  Surgeon: Grace Isaac, MD;  Location: [place];  Service: Thoracic;  Laterality: N/A;   VIDEO MEDIASTINOSCOPY N/A 02/05/2017   Procedure: VIDEO MEDIASTINOSCOPY;  Surgeon: Grace Isaac, MD;  Location: [facility] OR;  Service: Thoracic;  Laterality: N/A;    Allergies  No Known Allergies  History of Present Illness    Jeffery Wood  is a 66 year old male with the above mention past medical history who presents today for complaint of chest pain.  Jeffery Wood underwent diagnostic LHC in 2012 for abnormal  treadmill stress test that was normal.  He was seen by Jeffery Wood in 05/2021 after undergoing calcium scoring by his PCP that showed score of 294.  He also had complaints of intermittent chest discomfort.  He underwent left heart cath in 05/2010 with successful PCI of the mid LAD, distal LAD and first diagonal ostium.  He was started on DAPT for 6 months.  His EF was 55-65% by LV gram and patient was discharged on 06/11/2021.  He was seen in follow-up by Jeffery Bame, NP and reported doing much better but had occasional left-sided chest pains that occurred with and without exertion.  The importance of DAPT was reinforced during visit.  He was seen by Jeffery Wood on 12/27 and reported chest discomfort that is reoccurring and worse with exertion.  He is currently not taking nitroglycerin.  Recommendation was made for patient to be assessed in office.   Jeffery Wood presents today for complaint of chest pain.  Since last being seen in the office patient reports that he has been having waxing and waning chest discomfort in the central portion of his chest that does not travel.  He notes that this pain is not associated with any dizziness or palpitations.  His blood pressure today is well-controlled at 134/80 and heart rate is 78 bpm.  He is compliant with his current medication regimen and denies any adverse reactions.  He does report that yesterday he noted 1300 pounds of deer corn without any chest discomfort.  He has not tried nitroglycerin for his chest discomfort because he felt that it was not warranted.  He reports that his family is not pain but more uneasiness in his chest.  During our visit we discussed the indications for nitroglycerin and also reviewed primary and secondary prevention for cardiovascular disease.  Patient denies chest pain, palpitations, dyspnea, PND, orthopnea, nausea, vomiting, dizziness, syncope, edema, weight gain, or early satiety.  Home Medications    Current Outpatient Medications   Medication Sig Dispense Refill   donepezil (ARICEPT) 5 MG tablet Take 1 tablet (5 mg total) by mouth daily. 30 tablet 1   albuterol (PROVENTIL) (2.5 MG/3ML) 0.083% nebulizer solution Take 2.5 mg by nebulization every 4 (four) hours as needed for wheezing or shortness of breath.     aspirin EC 81 MG tablet Take 81 mg by mouth daily.     atorvastatin (LIPITOR) 40 MG tablet Take 1 tablet (40 mg total) by mouth daily. 90 tablet 3   augmented betamethasone dipropionate (DIPROLENE-AF) 0.05 % cream APPLY AS DIRECTED TO AFFECTED AREAS ON THE SKIN 50 g 99   benzonatate (TESSALON) 200 MG capsule Take 1 capsule by mouth 2 times daily as needed for cough. 20 capsule 0   celecoxib (CELEBREX) 200 MG capsule Take 1 capsule (200 mg total) by mouth 2 (two) times daily as needed. 60 capsule 5   ciclopirox (PENLAC) 8 %  solution Apply topically at bedtime. Apply over nail and surrounding skin. Apply daily over previous coat. After seven (7) days, may remove with alcohol and continue cycle. 6.6 mL 2   clopidogrel (PLAVIX) 75 MG tablet Take 1 tablet (75 mg total) by mouth daily with breakfast. 90 tablet 3   cyclobenzaprine (FLEXERIL) 10 MG tablet Take 1 tablet (10 mg total) by mouth 3 (three) times daily as needed for muscle spasms. 30 tablet 1   docusate sodium (COLACE) 100 MG capsule Take 1 capsule (100 mg total) by mouth 2 (two) times daily. 10 capsule 0   DULoxetine (CYMBALTA) 30 MG capsule TAKE 1 CAPSULE (30 MG TOTAL) BY MOUTH AT BEDTIME. 90 capsule 1   Fluticasone-Umeclidin-Vilant (TRELEGY ELLIPTA) 100-62.5-25 MCG/ACT AEPB Inhale 1 puff into the lungs daily at 2 PM. 2 each 0   gabapentin (NEURONTIN) 300 MG capsule Take 3 capsules (900 mg total) by mouth 3 (three) times daily. 810 capsule 3   glimepiride (AMARYL) 4 MG tablet Take 1/2 tablet by mouth before biggest meal of the day. 60 tablet 1   levalbuterol (XOPENEX HFA) 45 MCG/ACT inhaler Inhale 2 puffs into the lungs daily as needed for wheezing or shortness of  breath. 15 g 1   loratadine (CLARITIN) 10 MG tablet Take 10 mg by mouth daily.     metFORMIN (GLUCOPHAGE) 1000 MG tablet Take 1 tablet (1,000 mg total) by mouth 2 (two) times daily. 180 tablet 0   nitroGLYCERIN (NITROSTAT) 0.4 MG SL tablet Place 1 tablet (0.4 mg total) under the tongue every 5 (five) minutes as needed for chest pain. 25 tablet 3   pantoprazole (PROTONIX) 40 MG tablet Take 1 tablet (40 mg total) by mouth daily. 90 tablet 1   tamsulosin (FLOMAX) 0.4 MG CAPS capsule TAKE 1 CAPSULE (0.4 MG TOTAL) BY MOUTH DAILY. 90 capsule 1   tiotropium (SPIRIVA HANDIHALER) 18 MCG inhalation capsule PLACE ONE CAPSULE IN THE INHALER AND INHALE ONCE DAILY 30 capsule 99   traMADol (ULTRAM) 50 MG tablet Take 1 tablet (50 mg total) by mouth every 8 (eight) hours as needed. 50 tablet 1   traZODone (DESYREL) 50 MG tablet Take 1/2-1 tablet by mouth at bedtime as needed for sleep. 30 tablet 3   No current facility-administered medications for this visit.     Review of Systems  Please see the history of present illness.    (+) Chest discomfort (+) Shortness of breath  All other systems reviewed and are otherwise negative except as noted above.  Physical Exam    Wt Readings from Last 3 Encounters:  01/06/22 248 lb 6.4 oz (112.7 kg)  01/03/22 248 lb 6.4 oz (112.7 kg)  12/29/21 248 lb (112.5 kg)   AY:TKZSW were no vitals filed for this visit.,There is no height or weight on file to calculate BMI.  Constitutional:      Appearance: Healthy appearance. Not in distress.  Neck:     Vascular: JVD normal.  Pulmonary:     Effort: Pulmonary effort is normal.     Breath sounds: No wheezing. No rales. Diminished in the bases Cardiovascular:     Normal rate. Regular rhythm. Normal S1. Normal S2.      Murmurs: There is no murmur.  Edema:    Peripheral edema absent.  Abdominal:     Palpations: Abdomen is soft non tender. There is no hepatomegaly.  Skin:    General: Skin is warm and dry.  Neurological:      General: No focal  deficit present.     Mental Status: Alert and oriented to person, place and time.     Cranial Nerves: Cranial nerves are intact.  EKG/LABS/Other Studies Reviewed    ECG personally reviewed by me today -sinus rhythm with PACs and rate of 78 bpm with no acute changes consistent with previous EKG.   Lab Results  Component Value Date   WBC 7.3 01/06/2022   HGB 14.9 01/06/2022   HCT 43.8 01/06/2022   MCV 89.1 01/06/2022   PLT 240.0 01/06/2022   Lab Results  Component Value Date   CREATININE 0.72 01/06/2022   BUN 15 01/06/2022   NA 138 01/06/2022   K 4.4 01/06/2022   CL 101 01/06/2022   CO2 29 01/06/2022   Lab Results  Component Value Date   ALT 13 01/06/2022   AST 16 01/06/2022   ALKPHOS 71 01/06/2022   BILITOT 0.7 01/06/2022   Lab Results  Component Value Date   CHOL 110 11/10/2021   HDL 38.70 (L) 11/10/2021   LDLCALC 58 11/10/2021   TRIG 66.0 11/10/2021   CHOLHDL 3 11/10/2021    Lab Results  Component Value Date   HGBA1C 5.9 11/10/2021    Assessment & Plan    1.  Coronary artery disease: -s/p Successful PCI of the mid LAD, distal LAD and first diagonal ostium 05/2021 -Patient reports increased chest discomfort that is worse with exertion.  Today patient reports that he is still experiencing on and off waxing and waning chest discomfort. He reports that he has not tried nitroglycerin for his discomfort and was advised to take at his next episode.  EKG today without any acute changes but PVCs noted. -Due to his history we will send for Lexiscan Myoview to evaluate for possible ischemic changes. -We will start Toprol-XL 25 mg daily -Continue GDMT with ASA 81 mg, Plavix 75 mg, and as needed Nitrostat -ED return precautions were discussed and patient has a understanding to seek care if pain becomes intractable and is not relieved with as needed nitroglycerin.  2.  Essential hypertension: -Patient's blood pressure today was 134/80 -Continue  Toprol-XL 25 mg daily  3.  Hyperlipidemia: -Patient's last LDL cholesterol was -Continue Lipitor 40 mg daily  4.  COPD: -Continue current treatment plan per pulmonology  Disposition: Follow-up with [name], MD or APP in 1 months Shared Decision Making/Informed Consent The risks [chest pain, shortness of breath, cardiac arrhythmias, dizziness, blood pressure fluctuations, myocardial infarction, stroke/transient ischemic attack, nausea, vomiting, allergic reaction, radiation exposure, metallic taste sensation and life-threatening complications (estimated to be 1 in 10,000)], benefits (risk stratification, diagnosing coronary artery disease, treatment guidance) and alternatives of a nuclear stress test were discussed in detail with Jeffery Wood and he agrees to proceed.   Medication Adjustments/Labs and Tests Ordered: Current medicines are reviewed at length with the patient today.  Concerns regarding medicines are outlined above.   Signed, Mable Fill, Marissa Nestle, NP 01/26/2022, 7:54 PM [facility]  Note:  This document was prepared using Dragon voice recognition software and may include unintentional dictation errors.

## 2022-01-26 NOTE — Telephone Encounter (Signed)
Agree with planned appt

## 2022-01-26 NOTE — Telephone Encounter (Signed)
Dr. Gearldine Shown office is returning call. Requesting call back.

## 2022-01-26 NOTE — Telephone Encounter (Signed)
Received a call from [name] with Dr Gearldine Shown office. Patient reported to Dr Benay Spice he has been having chest pain.  Patient describes the feeling as chest discomfort rather than pain. Discomfort went away for several weeks after stent placement in November. Now recurring. Worse with exertion, not taking nitro. Dr. Benay Spice does not think this is related to patient's lymphoma and would like for him to be assessed.   Scheduled for Friday 01/27/22 with Ambrose Pancoast, NP for evaluation. [name] will contact patient to advise of appointment.

## 2022-01-27 ENCOUNTER — Encounter: Payer: Self-pay | Admitting: Nurse Practitioner

## 2022-01-27 ENCOUNTER — Ambulatory Visit: Payer: Medicare PPO | Attending: Nurse Practitioner | Admitting: Nurse Practitioner

## 2022-01-27 ENCOUNTER — Other Ambulatory Visit (HOSPITAL_COMMUNITY): Payer: Self-pay

## 2022-01-27 ENCOUNTER — Other Ambulatory Visit: Payer: Self-pay | Admitting: Internal Medicine

## 2022-01-27 ENCOUNTER — Other Ambulatory Visit: Payer: Self-pay

## 2022-01-27 VITALS — BP 134/80 | HR 78 | Ht 72.0 in | Wt 253.4 lb

## 2022-01-27 DIAGNOSIS — R079 Chest pain, unspecified: Secondary | ICD-10-CM

## 2022-01-27 DIAGNOSIS — I10 Essential (primary) hypertension: Secondary | ICD-10-CM | POA: Diagnosis not present

## 2022-01-27 DIAGNOSIS — J449 Chronic obstructive pulmonary disease, unspecified: Secondary | ICD-10-CM

## 2022-01-27 DIAGNOSIS — I2510 Atherosclerotic heart disease of native coronary artery without angina pectoris: Secondary | ICD-10-CM

## 2022-01-27 DIAGNOSIS — E785 Hyperlipidemia, unspecified: Secondary | ICD-10-CM

## 2022-01-27 MED ORDER — METOPROLOL SUCCINATE ER 25 MG PO TB24
25.0000 mg | ORAL_TABLET | Freq: Every day | ORAL | 1 refills | Status: DC
  Filled 2022-01-27: qty 90, 90d supply, fill #0
  Filled 2022-04-27: qty 90, 90d supply, fill #1

## 2022-01-27 MED ORDER — TRAMADOL HCL 50 MG PO TABS
50.0000 mg | ORAL_TABLET | Freq: Three times a day (TID) | ORAL | 1 refills | Status: DC | PRN
  Filled 2022-01-27 (×2): qty 50, 17d supply, fill #0
  Filled 2022-04-08: qty 50, 17d supply, fill #1

## 2022-01-27 MED ORDER — TRELEGY ELLIPTA 100-62.5-25 MCG/ACT IN AEPB
1.0000 | INHALATION_SPRAY | Freq: Every day | RESPIRATORY_TRACT | 12 refills | Status: DC
Start: 2022-01-27 — End: 2022-02-28
  Filled 2022-01-27: qty 60, 30d supply, fill #0
  Filled 2022-02-25: qty 60, 30d supply, fill #1

## 2022-01-27 NOTE — Telephone Encounter (Signed)
Dr. Annamaria Boots, please advise if patient can have a rx sent in for Trelegy 100.  At [facility] on 12/29/2021, patient given 2 samples.  Pt has a return OV with you on 02/28/2022.  Pt requesting rx sent to [facility]. Thank you.

## 2022-01-27 NOTE — Telephone Encounter (Signed)
Trelegy script sent

## 2022-01-27 NOTE — Patient Instructions (Signed)
Medication Instructions:  Your physician has recommended you make the following change in your medication:   START Toprol XL 25 taking 1 daily   *If you need a refill on your cardiac medications before your next appointment, please call your pharmacy*   Lab Work: None ordered  If you have labs (blood work) drawn today and your tests are completely normal, you will receive your results only by: [place] (if you have MyChart) OR A paper copy in the mail If you have any lab test that is abnormal or we need to change your treatment, we will call you to review the results.   Testing/Procedures: Your physician has requested that you have a lexiscan myoview. For further information please visit [email or web address]. Please follow instruction sheet, BELOW:    You are scheduled for a Myocardial Perfusion Imaging Study  Please arrive 15 minutes prior to your appointment time for registration and insurance purposes.  The test will take approximately 3 to 4 hours to complete; you may bring reading material.  If someone comes with you to your appointment, they will need to remain in the main lobby due to limited space in the testing area. **If you are pregnant or breastfeeding, please notify the nuclear lab prior to your appointment**  How to prepare for your Myocardial Perfusion Test: Do not eat or drink 3 hours prior to your test, except you may have water. Do not consume products containing caffeine (regular or decaffeinated) 12 hours prior to your test. (ex: coffee, chocolate, sodas, tea). Do bring a list of your current medications with you.  If not listed below, you may take your medications as normal.  If you're not able to eat a full breakfast, hold the Metformin and Glimepiride until after the test. Do wear comfortable clothes (no dresses or overalls) and walking shoes, tennis shoes preferred (No heels or open toe shoes are allowed). Do NOT wear cologne, perfume, aftershave, or  lotions (deodorant is allowed). If these instructions are not followed, your test will have to be rescheduled.     Follow-Up: At [facility], you and your health needs are our priority.  As part of our continuing mission to provide you with exceptional heart care, we have created designated Provider Care Teams.  These Care Teams include your primary Cardiologist (physician) and Advanced Practice Providers (APPs -  Physician Assistants and Nurse Practitioners) who all work together to provide you with the care you need, when you need it.  We recommend signing up for the patient portal called "MyChart".  Sign up information is provided on this After Visit Summary.  MyChart is used to connect with patients for Virtual Visits (Telemedicine).  Patients are able to view lab/test results, encounter notes, upcoming appointments, etc.  Non-urgent messages can be sent to your provider as well.   To learn more about what you can do with MyChart, go to [email or web address].    Your next appointment:   1 month(s)  The format for your next appointment:   In Person  Provider:   Ambrose Pancoast, NP         Other Instructions   Important Information About Sugar

## 2022-02-02 NOTE — Telephone Encounter (Signed)
Patient was given detailed instructions regarding his Stress Test that is scheduled for 02/03/22 at 7:15. Patient was also asked to come 15 minutes earlier than his scheduled visit.

## 2022-02-03 ENCOUNTER — Ambulatory Visit (HOSPITAL_COMMUNITY): Payer: Medicare PPO | Attending: Cardiovascular Disease

## 2022-02-03 DIAGNOSIS — I2510 Atherosclerotic heart disease of native coronary artery without angina pectoris: Secondary | ICD-10-CM | POA: Diagnosis not present

## 2022-02-03 DIAGNOSIS — I10 Essential (primary) hypertension: Secondary | ICD-10-CM | POA: Insufficient documentation

## 2022-02-03 DIAGNOSIS — R079 Chest pain, unspecified: Secondary | ICD-10-CM | POA: Diagnosis not present

## 2022-02-03 LAB — MYOCARDIAL PERFUSION IMAGING
LV dias vol: 45 mL (ref 62–150)
LV sys vol: 115 mL
Nuc Stress EF: 61 %
Peak HR: 78 {beats}/min
Rest HR: 70 {beats}/min
Rest Nuclear Isotope Dose: 10.2 mCi
SDS: 8
SRS: 4
SSS: 13
ST Depression (mm): 0 mm
Stress Nuclear Isotope Dose: 32.7 mCi
TID: 1

## 2022-02-03 MED ORDER — REGADENOSON 0.4 MG/5ML IV SOLN
0.4000 mg | Freq: Once | INTRAVENOUS | Status: AC
  Administered 2022-02-03: 0.4 mg via INTRAVENOUS

## 2022-02-03 MED ORDER — TECHNETIUM TC 99M TETROFOSMIN IV KIT
32.7000 | PACK | Freq: Once | INTRAVENOUS | Status: AC | PRN
  Administered 2022-02-03: 32.7 via INTRAVENOUS

## 2022-02-03 MED ORDER — TECHNETIUM TC 99M TETROFOSMIN IV KIT
10.2000 | PACK | Freq: Once | INTRAVENOUS | Status: AC | PRN
  Administered 2022-02-03: 10.2 via INTRAVENOUS

## 2022-02-06 ENCOUNTER — Telehealth: Payer: Self-pay | Admitting: Internal Medicine

## 2022-02-06 ENCOUNTER — Other Ambulatory Visit: Payer: Self-pay

## 2022-02-06 NOTE — Telephone Encounter (Signed)
Patient is returning call to discuss stress test results. 

## 2022-02-07 NOTE — Telephone Encounter (Signed)
Patient has been notified directly; all questions, if any, were answered. Patient voiced understanding.   

## 2022-02-14 ENCOUNTER — Other Ambulatory Visit: Payer: Self-pay | Admitting: Family Medicine

## 2022-02-14 ENCOUNTER — Other Ambulatory Visit: Payer: Self-pay

## 2022-02-14 ENCOUNTER — Other Ambulatory Visit (HOSPITAL_COMMUNITY): Payer: Self-pay

## 2022-02-14 MED ORDER — DULOXETINE HCL 30 MG PO CPEP
30.0000 mg | ORAL_CAPSULE | Freq: Every day | ORAL | 1 refills | Status: DC
  Filled 2022-02-14: qty 90, 90d supply, fill #0
  Filled 2022-05-11: qty 90, 90d supply, fill #1

## 2022-02-24 NOTE — Progress Notes (Unsigned)
Subjective:    Patient ID: Jeffery Wood, male    DOB: [birth year], 67 y.o.   MRN: [id number]  [07/22/13- Dr Jeffery Wood- The patient is a 67 year old male who I've been asked to see for management of obstructive sleep apnea.  He was apparently diagnosed 10 years ago, and was tried on CPAP with very poor tolerance. He tells me he was tried on all different types of machines, pressure settings, and different masks without success. He could simply not keep the mask on his face. This is gone untreated over the years, and he recently underwent a home sleep test in March of this year which showed moderate OSA. He had an AHI of 24 events per hour with desaturation as low as 85%. The patient states that he is still having loud snoring, and bed partner comments on witnessed apneas.  He has frequent awakenings at night, and is not rested in the mornings upon arising. He has significant daytime sleepiness with any inactivity, and will fall asleep in the evenings watching television or movies. His Epworth score today is 22, and the patient tells that he has lost 30 pounds over the last 2 years.] ------------------------------------------ 12/29/21- New referral courtesy of Dr Jeffery Wood 2 yoM former smoker with concern of shortness of breath Medical problem list includes HTN, DM, CAD, OSA, Insomnia,Asthma/ COPD, GERD, Neuromuscular Disorder, Psoriasis, Lumbar Spondylolisthesis, BPH, Non-Hodkins Lymphoma, Gout,  -Neb albuterol, Xopenex hfa, Spiriva handihaler,  PFT 11/23/21- Severe obstruction with signif response to BD, Nl DLCO Covid vax- 2 J&J Flu vax   had                                   Jeffery Wood had seen me for general pulmonary 20 years ago.  Worked as a Dealer.  I do not have those records.  He had distinct improvement in dyspnea on exertion at the time of cardiac stenting.  A few weeks after that he rather abruptly noted more dyspnea on exertion which has not improved.  No cough little wheeze.  He has a  rescue inhaler which he is not using.  Reports Spiriva "works great" when needed, used intermittently. He is concerned about nagging pain in right shoulder.  Discomfort in that shoulder had led to evaluation finding his lymphoma.  He is currently in short-term remission followed by oncology.  02/28/22- 66 yoM former smoker( 60 pk yrs) followed for COPD, complicated by HTN, DM, CAD, OSA, Insomnia, GERD, Neuromuscular Disorder, Psoriasis, Lumbar Spondylolisthesis, BPH, Non-Hodkins Lymphoma, Gout,  -Neb albuterol, Xopenex hfa, Spiriva handihaler, sampled Trelegy 100 -------Pt states he has been doing alright  Covid vax-2 J&J Flu vax-had He had like to Spiriva but found Trelegy sample worked much better.  He has gotten through the winter so far with no significant infection or acute issues. Discussed his cardiac stents.                                                                 PFT 11/23/21- Severe obstruction with signif response to BD, Nl DLCO Covid vax- 2 J&J Flu vax   had   CXR 12/29/21- IMPRESSION: 1. No acute abnormality. 2. Mildly progressive changes of COPD and  chronic bronchitis.                                                         //status of OSA//?   ROS-see HPI   + = positive Constitutional:    weight loss, night sweats, fevers, chills, fatigue, lassitude. HEENT:    headaches, difficulty swallowing, tooth/dental problems, sore throat,       sneezing, itching, ear ache, nasal congestion, post nasal drip, snoring CV:    chest pain, orthopnea, PND, swelling in lower extremities, anasarca,                                   dizziness, palpitations Resp:   +shortness of breath with exertion or at rest.                productive cough,   +non-productive cough, coughing up of blood.              change in color of mucus.  wheezing.   Skin:    rash or lesions. GI:  No-   heartburn, indigestion, abdominal pain, nausea, vomiting, diarrhea,                 change in bowel habits,  loss of appetite GU: dysuria, change in color of urine, no urgency or frequency.   flank pain. MS:   joint pain, stiffness, decreased range of motion, back pain. Neuro-     nothing unusual Psych:  change in mood or affect.  depression or anxiety.   memory loss.  OBJ- Physical Exam General- Alert, Oriented, Affect-appropriate, Distress- none acute, +full beard Skin- rash-none, lesions- none, excoriation- none Lymphadenopathy- none Head- atraumatic            Eyes- Gross vision intact, PERRLA, conjunctivae and secretions clear            Ears- Hearing, canals-normal            Nose- Clear, no-Septal dev, mucus, polyps, erosion, perforation             Throat- Mallampati II , mucosa clear , drainage- none, tonsils- atrophic Neck- flexible , trachea midline, no stridor , thyroid nl, carotid no bruit Chest - symmetrical excursion , unlabored           Heart/CV- RRR , no murmur , no gallop  , no rub, nl s1 s2                           - JVD- none , edema- none, stasis changes- none, varices- none           Lung- +diminished, wheeze- none, cough- none , dullness-none, rub- none           Chest wall-  Abd-  Br/ Gen/ Rectal- Not done, not indicated Extrem- cyanosis- none, clubbing, none, atrophy- none, strength- nl Neuro- grossly intact to observation     Assessment & Plan:

## 2022-02-25 ENCOUNTER — Other Ambulatory Visit: Payer: Self-pay | Admitting: Family Medicine

## 2022-02-25 ENCOUNTER — Other Ambulatory Visit (HOSPITAL_COMMUNITY): Payer: Self-pay

## 2022-02-27 ENCOUNTER — Other Ambulatory Visit (HOSPITAL_COMMUNITY): Payer: Self-pay

## 2022-02-27 ENCOUNTER — Other Ambulatory Visit: Payer: Self-pay

## 2022-02-27 MED ORDER — GLIMEPIRIDE 4 MG PO TABS
ORAL_TABLET | ORAL | 1 refills | Status: DC
  Filled 2022-02-27: qty 15, 30d supply, fill #0
  Filled 2022-03-27: qty 15, 30d supply, fill #1
  Filled 2022-04-27: qty 15, 30d supply, fill #2
  Filled 2022-05-29: qty 15, 30d supply, fill #3
  Filled 2022-06-23: qty 15, 30d supply, fill #4
  Filled 2022-07-24: qty 15, 30d supply, fill #5
  Filled 2022-08-25 (×2): qty 15, 30d supply, fill #6
  Filled 2022-09-20: qty 15, 30d supply, fill #7

## 2022-02-28 ENCOUNTER — Encounter: Payer: Self-pay | Admitting: Internal Medicine

## 2022-02-28 ENCOUNTER — Other Ambulatory Visit (HOSPITAL_COMMUNITY): Payer: Self-pay

## 2022-02-28 ENCOUNTER — Ambulatory Visit: Payer: Medicare PPO | Admitting: Internal Medicine

## 2022-02-28 VITALS — BP 124/76 | HR 65 | Ht 74.0 in | Wt 252.6 lb

## 2022-02-28 DIAGNOSIS — J449 Chronic obstructive pulmonary disease, unspecified: Secondary | ICD-10-CM

## 2022-02-28 DIAGNOSIS — I2510 Atherosclerotic heart disease of native coronary artery without angina pectoris: Secondary | ICD-10-CM | POA: Diagnosis not present

## 2022-02-28 MED ORDER — TRELEGY ELLIPTA 100-62.5-25 MCG/ACT IN AEPB
1.0000 | INHALATION_SPRAY | Freq: Every day | RESPIRATORY_TRACT | 12 refills | Status: DC
  Filled 2022-02-28 – 2022-04-08 (×2): qty 60, 30d supply, fill #0

## 2022-02-28 NOTE — Assessment & Plan Note (Signed)
Severe COPD but with little cough.  Symptoms fit emphysema best but he responds well to Trelegy. Plan-refill Trelegy

## 2022-02-28 NOTE — Patient Instructions (Signed)
Ok to continue Trelegy 1 puff, then rinse mouth, once daily. Script sent to [facility].  Please call if we can help

## 2022-02-28 NOTE — Assessment & Plan Note (Signed)
He says he felt quite well for a few weeks after stents were placed and then was reverted to previous status, although cardiology tells him stents are working well.  He continues to follow with cardiology.

## 2022-03-02 ENCOUNTER — Other Ambulatory Visit: Payer: Self-pay | Admitting: Family Medicine

## 2022-03-02 NOTE — Progress Notes (Signed)
Office Visit    Patient Name: Jeffery Wood Date of Encounter: 03/02/2022  Primary Care Provider:  [name], MD Primary Cardiologist:  [name], MD Primary Electrophysiologist: None  Chief Complaint    Jeffery Wood is a 67 y.o. male with PMH of CAD s/p DES to mid LAD, distal LAD, first diagonal ostium, large cell lymphoma, COPD, HTN, DM type II who presents today for 1 month follow-up of chest pain.  Past Medical History    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    CAD (coronary artery disease) 2023   5v   Cancer ([place])    basal cell of right temple; carcinoids   Complication of anesthesia    hard to wake up after bronchoscopy   Diabetes ([place])    type 2   Dyspnea    GERD (gastroesophageal reflux disease)    Gout    resolved, no current problem   Headache    History of hiatal hernia 05/16/2006   small noted on EGD   Hypertension    hx   Lung mass    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[place])    last chemo 05/2017, in remission-chest, but all over   Memory loss    mild - r/t chemo, 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   Neuromuscular disorder ([place])    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   Sleep apnea    does not use anything for OSA   Past Surgical History:  Procedure Laterality Date   BACK SURGERY     L4-L5 bracket and screws   BICEPS TENDON REPAIR Left    CARPAL TUNNEL RELEASE     COLONOSCOPY     CORONARY BALLOON ANGIOPLASTY N/A 06/10/2021   Procedure: CORONARY BALLOON ANGIOPLASTY;  Surgeon: Jettie Booze, MD;  Location: [facility];  Service: Cardiovascular;  Laterality: N/A;   CORONARY STENT INTERVENTION N/A 06/10/2021   Procedure: CORONARY STENT INTERVENTION;  Surgeon: Jettie Booze, MD;  Location: [facility];  Service: Cardiovascular;  Laterality: N/A;   FINGER SURGERY     INTRAVASCULAR ULTRASOUND/IVUS N/A 06/10/2021   Procedure: Intravascular  Ultrasound/IVUS;  Surgeon: Jettie Booze, MD;  Location: [facility];  Service: Cardiovascular;  Laterality: N/A;   KNEE ARTHROSCOPY     ACL tear   LEFT HEART CATH AND CORONARY ANGIOGRAPHY N/A 06/10/2021   Procedure: LEFT HEART CATH AND CORONARY ANGIOGRAPHY;  Surgeon: Jettie Booze, MD;  Location: [facility];  Service: Cardiovascular;  Laterality: N/A;   LEFT HEART CATHETERIZATION WITH CORONARY ANGIOGRAM N/A 12/07/2010   Procedure: LEFT HEART CATHETERIZATION WITH CORONARY ANGIOGRAM;  Surgeon: Leonie Man, MD;  Location: [facility];  Service: Cardiovascular;  Laterality: N/A;   LUMBAR LAMINECTOMY/DECOMPRESSION MICRODISCECTOMY Right 12/09/2020   Procedure: MICRODISCECTOMY, RIGHT LUMBAR TWO-THREE, LUMBAR THREE-FOUR;  Surgeon: Newman Pies, MD;  Location: [facility];  Service: Neurosurgery;  Laterality: Right;   PILONIDAL CYST EXCISION     PORT-A-CATH REMOVAL N/A 11/09/2017   Procedure: REMOVAL PORT-A-CATH;  Surgeon: [name], MD;  Location: [facility];  Service: General;  Laterality: N/A;  local   PORTACATH PLACEMENT Left 02/27/2017   Procedure: INSERTION PORT-A-CATH;  Surgeon: [name], MD;  Location: [facility];  Service: General;  Laterality: Left;   UPPER GASTROINTESTINAL ENDOSCOPY  05/16/2006   VIDEO BRONCHOSCOPY WITH ENDOBRONCHIAL ULTRASOUND  N/A 01/24/2017   Procedure: VIDEO BRONCHOSCOPY WITH ENDOBRONCHIAL ULTRASOUND with TRANSBRONCHIAL BIOPSY;  Surgeon: Grace Isaac, MD;  Location: [facility];  Service: Thoracic;  Laterality: N/A;   VIDEO MEDIASTINOSCOPY N/A 02/05/2017   Procedure: VIDEO MEDIASTINOSCOPY;  Surgeon: Grace Isaac, MD;  Location: [facility] OR;  Service: Thoracic;  Laterality: N/A;    Allergies  No Known Allergies  History of Present Illness    Jeffery Wood  is a 67 year old male with the above mention past medical history who presents today for complaint of chest pain.  Jeffery Wood underwent diagnostic LHC in 2012  for abnormal treadmill stress test that was normal.  He was seen by Dr. Harrington Challenger in 05/2021 after undergoing calcium scoring by his PCP that showed score of 294.  He also had complaints of intermittent chest discomfort.  He underwent left heart cath in 05/2010 with successful PCI of the mid LAD, distal LAD and first diagonal ostium.  He was started on DAPT for 6 months.  His EF was 55-65% by LV gram and patient was discharged on 06/11/2021.  He was seen in follow-up by Christen Bame, NP and reported doing much better but had occasional left-sided chest pains that occurred with and without exertion.  She discussed the importance of DAPT compliance  during visit.  He was seen by Dr. Benay Spice on 12/27 and reported chest discomfort that is reoccurring and worse with exertion.  He is currently not taking nitroglycerin and  recommendation was made for patient to be assessed in office.  He was seen on 01/27/2022 for complaint of waxing waning chest pain.  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  He was sent for [facility] to rule out any possible ischemic changes that revealed no evidence of ischemia or ST elevation.   Jeffery Wood presents today for 1 month follow-up for chest pain and to review Myoview results.  Since last being seen in the office patient reports that he is been feeling okay but still has occasional bouts of chest pain.  He reports that his pain is waxing and waning and is not associated with exertion.  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  His blood pressure today is controlled at 132/78 heart rate was 60 bpm.  I was able to discuss his case with our DOD regarding possibility of needing a repeat left heart cath and it was determined that his antianginal medicines would need to be titrated up further with continuation of pain prior to needing  a repeat cath.  He is compliant with his medications and does report an occasional missed dose that he is taking right away.  Patient denies chest pain, palpitations, dyspnea, PND, orthopnea, nausea, vomiting, dizziness, syncope, edema, weight gain, or early satiety.   Home Medications    Current Outpatient Medications  Medication Sig Dispense Refill   albuterol (PROVENTIL) (2.5 MG/3ML) 0.083% nebulizer solution Take 2.5 mg by nebulization every 4 (four) hours as needed for wheezing or shortness of breath. (Patient not taking: Reported on 02/28/2022)     aspirin EC 81 MG tablet Take 81 mg by mouth daily.     atorvastatin (LIPITOR) 40 MG tablet Take 1 tablet (40 mg total) by mouth daily. 90 tablet 3   benzonatate (TESSALON) 200 MG capsule Take 1 capsule by mouth 2 times daily as needed  for cough. (Patient not taking: Reported on 02/28/2022) 20 capsule 0   celecoxib (CELEBREX) 200 MG capsule Take 1 capsule (200 mg total) by mouth 2 (two) times daily as needed. 60 capsule 5   ciclopirox (PENLAC) 8 % solution Apply topically at bedtime. Apply over nail and surrounding skin. Apply daily over previous coat. After seven (7) days, may remove with alcohol and continue cycle. 6.6 mL 2   clopidogrel (PLAVIX) 75 MG tablet Take 1 tablet (75 mg total) by mouth daily with breakfast. 90 tablet 3   cyclobenzaprine (FLEXERIL) 10 MG tablet Take 1 tablet (10 mg total) by mouth 3 (three) times daily as needed for muscle spasms. 30 tablet 1   docusate sodium (COLACE) 100 MG capsule Take 1 capsule (100 mg total) by mouth 2 (two) times daily. 10 capsule 0   donepezil (ARICEPT) 5 MG tablet Take 1 tablet (5 mg total) by mouth daily. 30 tablet 1   DULoxetine (CYMBALTA) 30 MG capsule Take 1 capsule (30 mg total) by mouth at bedtime. 90 capsule 1   Fluticasone-Umeclidin-Vilant (TRELEGY ELLIPTA) 100-62.5-25 MCG/ACT AEPB Inhale 1 puff into the lungs daily. Rinse mouth after use 60 each 12   gabapentin (NEURONTIN) 300 MG capsule  Take 3 capsules (900 mg total) by mouth 3 (three) times daily. 810 capsule 3   glimepiride (AMARYL) 4 MG tablet Take 1/2 tablet by mouth before biggest meal of the day. 60 tablet 1   levalbuterol (XOPENEX HFA) 45 MCG/ACT inhaler Inhale 2 puffs into the lungs daily as needed for wheezing or shortness of breath. 15 g 1   loratadine (CLARITIN) 10 MG tablet Take 10 mg by mouth daily.     metFORMIN (GLUCOPHAGE) 1000 MG tablet Take 1 tablet (1,000 mg total) by mouth 2 (two) times daily. 180 tablet 0   metoprolol succinate (TOPROL XL) 25 MG 24 hr tablet Take 1 tablet (25 mg total) by mouth daily. 90 tablet 1   nitroGLYCERIN (NITROSTAT) 0.4 MG SL tablet Place 1 tablet (0.4 mg total) under the tongue every 5 (five) minutes as needed for chest pain. 25 tablet 3   pantoprazole (PROTONIX) 40 MG tablet Take 1 tablet (40 mg total) by mouth daily. 90 tablet 1   tamsulosin (FLOMAX) 0.4 MG CAPS capsule TAKE 1 CAPSULE (0.4 MG TOTAL) BY MOUTH DAILY. 90 capsule 1   traMADol (ULTRAM) 50 MG tablet Take 1 tablet (50 mg total) by mouth every 8 (eight) hours as needed. 50 tablet 1   traZODone (DESYREL) 50 MG tablet Take 0.5-1 tablets (25-50 mg total) by mouth at bedtime as needed for sleep. 30 tablet 3   No current facility-administered medications for this visit.     Review of Systems  Please see the history of present illness.    (+) Chest pain (+) Fatigue with heavy exertion, neuropathic foot pain  All other systems reviewed and are otherwise negative except as noted above.  Physical Exam    Wt Readings from Last 3 Encounters:  02/28/22 252 lb 9.6 oz (114.6 kg)  01/27/22 253 lb 6.4 oz (114.9 kg)  01/06/22 248 lb 6.4 oz (112.7 kg)   UX:NATFT were no vitals filed for this visit.,There is no height or weight on file to calculate BMI.  Constitutional:      Appearance: Healthy appearance. Not in distress.  Neck:     Vascular: JVD normal.  Pulmonary:     Effort: Pulmonary effort is normal.     Breath  sounds: No wheezing. No rales.  Diminished in the bases Cardiovascular:     Normal rate. Regular rhythm. Normal S1. Normal S2.      Murmurs: There is no murmur.  Edema:    Peripheral edema absent.  Abdominal:     Palpations: Abdomen is soft non tender. There is no hepatomegaly.  Skin:    General: Skin is warm and dry.  Neurological:     General: No focal deficit present.     Mental Status: Alert and oriented to person, place and time.     Cranial Nerves: Cranial nerves are intact.  EKG/LABS/Other Studies Reviewed    ECG personally reviewed by me today -none completed today  Lab Results  Component Value Date   WBC 7.3 01/06/2022   HGB 14.9 01/06/2022   HCT 43.8 01/06/2022   MCV 89.1 01/06/2022   PLT 240.0 01/06/2022   Lab Results  Component Value Date   CREATININE 0.72 01/06/2022   BUN 15 01/06/2022   NA 138 01/06/2022   K 4.4 01/06/2022   CL 101 01/06/2022   CO2 29 01/06/2022   Lab Results  Component Value Date   ALT 13 01/06/2022   AST 16 01/06/2022   ALKPHOS 71 01/06/2022   BILITOT 0.7 01/06/2022   Lab Results  Component Value Date   CHOL 110 11/10/2021   HDL 38.70 (L) 11/10/2021   LDLCALC 58 11/10/2021   TRIG 66.0 11/10/2021   CHOLHDL 3 11/10/2021    Lab Results  Component Value Date   HGBA1C 5.9 11/10/2021    Assessment & Plan    1.  Coronary artery disease: -s/p Successful PCI of the mid LAD, distal LAD and first diagonal ostium 05/2021 -Patient reports that he is still experiencing waxing and waning chest discomfort similar to his pain felt prior to his PCI. I discussed his case with Dr. Irish Lack our DOD who suggested that we add an additional antianginal medication. -We will add Imdur 15 mg daily -Continue GDMT with ASA 81 mg, Plavix 75 mg, and as needed Nitrostat  2.  Essential hypertension: -Patient's blood pressure today was well-controlled at 132/78 -Continue Toprol-XL 25 mg daily   3.  Hyperlipidemia: -Patient's last LDL cholesterol was 58  at goal -Continue Lipitor 40 mg daily   4.  COPD: -Continue current treatment plan per pulmonology  Disposition: Follow-up with [name], MD or APP in 3 months  Medication Adjustments/Labs and Tests Ordered: Current medicines are reviewed at length with the patient today.  Concerns regarding medicines are outlined above.   Signed, Mable Fill, Marissa Nestle, NP 03/02/2022, 11:08 AM [facility]  Note:  This document was prepared using Dragon voice recognition software and may include unintentional dictation errors.

## 2022-03-03 ENCOUNTER — Other Ambulatory Visit: Payer: Self-pay

## 2022-03-03 ENCOUNTER — Encounter: Payer: Self-pay | Admitting: Nurse Practitioner

## 2022-03-03 ENCOUNTER — Other Ambulatory Visit (HOSPITAL_COMMUNITY): Payer: Self-pay

## 2022-03-03 ENCOUNTER — Ambulatory Visit: Payer: Medicare PPO | Attending: Nurse Practitioner | Admitting: Nurse Practitioner

## 2022-03-03 VITALS — BP 132/78 | HR 60 | Ht 74.0 in | Wt 258.8 lb

## 2022-03-03 DIAGNOSIS — I10 Essential (primary) hypertension: Secondary | ICD-10-CM | POA: Diagnosis not present

## 2022-03-03 DIAGNOSIS — I2510 Atherosclerotic heart disease of native coronary artery without angina pectoris: Secondary | ICD-10-CM

## 2022-03-03 DIAGNOSIS — J449 Chronic obstructive pulmonary disease, unspecified: Secondary | ICD-10-CM | POA: Diagnosis not present

## 2022-03-03 DIAGNOSIS — R079 Chest pain, unspecified: Secondary | ICD-10-CM | POA: Diagnosis not present

## 2022-03-03 MED ORDER — ISOSORBIDE MONONITRATE ER 30 MG PO TB24
15.0000 mg | ORAL_TABLET | Freq: Every day | ORAL | 2 refills | Status: DC
  Filled 2022-03-03: qty 90, 180d supply, fill #0
  Filled 2022-03-27: qty 45, 90d supply, fill #0
  Filled 2022-03-31: qty 90, 180d supply, fill #0

## 2022-03-03 MED ORDER — DONEPEZIL HCL 5 MG PO TABS
5.0000 mg | ORAL_TABLET | Freq: Every day | ORAL | 1 refills | Status: DC
  Filled 2022-03-03: qty 30, 30d supply, fill #0
  Filled 2022-03-31 – 2022-04-01 (×2): qty 30, 30d supply, fill #1

## 2022-03-03 MED ORDER — TAMSULOSIN HCL 0.4 MG PO CAPS
0.4000 mg | ORAL_CAPSULE | Freq: Every day | ORAL | 1 refills | Status: DC
  Filled 2022-03-03: qty 90, 90d supply, fill #0
  Filled 2022-05-29: qty 90, 90d supply, fill #1

## 2022-03-03 MED ORDER — ISOSORBIDE MONONITRATE ER 30 MG PO TB24
15.0000 mg | ORAL_TABLET | Freq: Every day | ORAL | 1 refills | Status: DC
  Filled 2022-03-03: qty 30, 60d supply, fill #0

## 2022-03-03 NOTE — Patient Instructions (Addendum)
Medication Instructions:  START Imdur '15mg'$  take 1 tablet once a day  *If you need a refill on your cardiac medications before your next appointment, please call your pharmacy*   Lab Work: None ordered   Testing/Procedures: None ordered   Follow-Up: At [facility], you and your health needs are our priority.  As part of our continuing mission to provide you with exceptional heart care, we have created designated Provider Care Teams.  These Care Teams include your primary Cardiologist (physician) and Advanced Practice Providers (APPs -  Physician Assistants and Nurse Practitioners) who all work together to provide you with the care you need, when you need it.  We recommend signing up for the patient portal called "MyChart".  Sign up information is provided on this After Visit Summary.  MyChart is used to connect with patients for Virtual Visits (Telemedicine).  Patients are able to view lab/test results, encounter notes, upcoming appointments, etc.  Non-urgent messages can be sent to your provider as well.   To learn more about what you can do with MyChart, go to [email or web address].    Your next appointment:   3 month(s)  Provider:   [name], MD  or Ambrose Pancoast, NP   Other Instructions

## 2022-03-06 ENCOUNTER — Other Ambulatory Visit (HOSPITAL_COMMUNITY): Payer: Self-pay

## 2022-03-08 ENCOUNTER — Other Ambulatory Visit (HOSPITAL_COMMUNITY): Payer: Self-pay

## 2022-03-08 ENCOUNTER — Other Ambulatory Visit: Payer: Self-pay | Admitting: Family Medicine

## 2022-03-08 MED ORDER — METFORMIN HCL 1000 MG PO TABS
1000.0000 mg | ORAL_TABLET | Freq: Two times a day (BID) | ORAL | 0 refills | Status: DC
  Filled 2022-03-08: qty 180, 90d supply, fill #0

## 2022-03-09 ENCOUNTER — Other Ambulatory Visit: Payer: Self-pay

## 2022-03-09 ENCOUNTER — Other Ambulatory Visit (HOSPITAL_COMMUNITY): Payer: Self-pay

## 2022-03-27 ENCOUNTER — Other Ambulatory Visit: Payer: Self-pay | Admitting: Physician Assistant

## 2022-03-27 ENCOUNTER — Other Ambulatory Visit: Payer: Self-pay

## 2022-03-27 ENCOUNTER — Other Ambulatory Visit (HOSPITAL_COMMUNITY): Payer: Self-pay

## 2022-03-27 MED ORDER — CLOPIDOGREL BISULFATE 75 MG PO TABS
75.0000 mg | ORAL_TABLET | Freq: Every day | ORAL | 1 refills | Status: DC
  Filled 2022-03-27 – 2022-05-29 (×2): qty 90, 90d supply, fill #0
  Filled 2022-08-27: qty 90, 90d supply, fill #1

## 2022-04-01 ENCOUNTER — Other Ambulatory Visit: Payer: Self-pay | Admitting: Nurse Practitioner

## 2022-04-01 ENCOUNTER — Other Ambulatory Visit (HOSPITAL_COMMUNITY): Payer: Self-pay

## 2022-04-03 ENCOUNTER — Other Ambulatory Visit (HOSPITAL_COMMUNITY): Payer: Self-pay

## 2022-04-03 MED ORDER — ISOSORBIDE MONONITRATE ER 30 MG PO TB24
15.0000 mg | ORAL_TABLET | Freq: Every day | ORAL | 3 refills | Status: DC
  Filled 2022-04-03: qty 45, 90d supply, fill #0
  Filled 2022-04-15: qty 30, 60d supply, fill #0
  Filled 2022-06-03: qty 30, 60d supply, fill #1
  Filled 2022-08-13: qty 30, 60d supply, fill #2
  Filled 2022-10-08: qty 30, 60d supply, fill #3
  Filled 2022-12-07: qty 30, 60d supply, fill #4
  Filled 2023-02-05: qty 30, 60d supply, fill #5
  Filled 2023-04-03: qty 30, 60d supply, fill #6

## 2022-04-08 ENCOUNTER — Other Ambulatory Visit (HOSPITAL_COMMUNITY): Payer: Self-pay

## 2022-04-08 ENCOUNTER — Other Ambulatory Visit: Payer: Self-pay | Admitting: Family Medicine

## 2022-04-10 ENCOUNTER — Other Ambulatory Visit: Payer: Self-pay

## 2022-04-10 ENCOUNTER — Other Ambulatory Visit (HOSPITAL_COMMUNITY): Payer: Self-pay

## 2022-04-10 MED ORDER — DONEPEZIL HCL 5 MG PO TABS
5.0000 mg | ORAL_TABLET | Freq: Every day | ORAL | 1 refills | Status: DC
  Filled 2022-04-10 – 2022-04-30 (×2): qty 30, 30d supply, fill #0
  Filled 2022-05-29: qty 30, 30d supply, fill #1

## 2022-04-11 DIAGNOSIS — M4316 Spondylolisthesis, lumbar region: Secondary | ICD-10-CM | POA: Diagnosis not present

## 2022-04-13 ENCOUNTER — Other Ambulatory Visit (HOSPITAL_COMMUNITY): Payer: Self-pay

## 2022-04-15 ENCOUNTER — Encounter: Payer: Self-pay | Admitting: Oncology

## 2022-04-15 ENCOUNTER — Other Ambulatory Visit (HOSPITAL_COMMUNITY): Payer: Self-pay

## 2022-04-17 DIAGNOSIS — M1991 Primary osteoarthritis, unspecified site: Secondary | ICD-10-CM | POA: Diagnosis not present

## 2022-04-17 DIAGNOSIS — M5136 Other intervertebral disc degeneration, lumbar region: Secondary | ICD-10-CM | POA: Diagnosis not present

## 2022-04-17 DIAGNOSIS — M79644 Pain in right finger(s): Secondary | ICD-10-CM | POA: Diagnosis not present

## 2022-04-17 DIAGNOSIS — Z6833 Body mass index (BMI) 33.0-33.9, adult: Secondary | ICD-10-CM | POA: Diagnosis not present

## 2022-04-17 DIAGNOSIS — L409 Psoriasis, unspecified: Secondary | ICD-10-CM | POA: Diagnosis not present

## 2022-04-17 DIAGNOSIS — E669 Obesity, unspecified: Secondary | ICD-10-CM | POA: Diagnosis not present

## 2022-04-17 DIAGNOSIS — M79645 Pain in left finger(s): Secondary | ICD-10-CM | POA: Diagnosis not present

## 2022-04-17 DIAGNOSIS — M1A09X0 Idiopathic chronic gout, multiple sites, without tophus (tophi): Secondary | ICD-10-CM | POA: Diagnosis not present

## 2022-04-27 ENCOUNTER — Other Ambulatory Visit: Payer: Self-pay

## 2022-04-28 ENCOUNTER — Other Ambulatory Visit (HOSPITAL_COMMUNITY): Payer: Self-pay

## 2022-04-29 ENCOUNTER — Other Ambulatory Visit (HOSPITAL_COMMUNITY): Payer: Self-pay

## 2022-05-01 ENCOUNTER — Other Ambulatory Visit: Payer: Self-pay

## 2022-05-01 ENCOUNTER — Other Ambulatory Visit (HOSPITAL_COMMUNITY): Payer: Self-pay

## 2022-05-02 ENCOUNTER — Other Ambulatory Visit (HOSPITAL_COMMUNITY): Payer: Self-pay

## 2022-05-03 ENCOUNTER — Encounter: Payer: Self-pay | Admitting: Family Medicine

## 2022-05-04 ENCOUNTER — Inpatient Hospital Stay: Payer: Medicare PPO | Attending: Oncology | Admitting: Oncology

## 2022-05-04 ENCOUNTER — Other Ambulatory Visit: Payer: Self-pay | Admitting: *Deleted

## 2022-05-04 ENCOUNTER — Other Ambulatory Visit (HOSPITAL_COMMUNITY): Payer: Self-pay

## 2022-05-04 VITALS — BP 134/80 | HR 82 | Temp 97.9°F | Resp 18 | Ht 74.0 in | Wt 251.6 lb

## 2022-05-04 DIAGNOSIS — M545 Low back pain, unspecified: Secondary | ICD-10-CM | POA: Insufficient documentation

## 2022-05-04 DIAGNOSIS — C8330 Diffuse large B-cell lymphoma, unspecified site: Secondary | ICD-10-CM | POA: Diagnosis not present

## 2022-05-04 DIAGNOSIS — T451X5A Adverse effect of antineoplastic and immunosuppressive drugs, initial encounter: Secondary | ICD-10-CM | POA: Insufficient documentation

## 2022-05-04 DIAGNOSIS — Z87891 Personal history of nicotine dependence: Secondary | ICD-10-CM | POA: Diagnosis not present

## 2022-05-04 DIAGNOSIS — J4489 Other specified chronic obstructive pulmonary disease: Secondary | ICD-10-CM | POA: Insufficient documentation

## 2022-05-04 DIAGNOSIS — C8529 Mediastinal (thymic) large B-cell lymphoma, extranodal and solid organ sites: Secondary | ICD-10-CM | POA: Insufficient documentation

## 2022-05-04 DIAGNOSIS — K5903 Drug induced constipation: Secondary | ICD-10-CM | POA: Insufficient documentation

## 2022-05-04 DIAGNOSIS — M79601 Pain in right arm: Secondary | ICD-10-CM | POA: Diagnosis not present

## 2022-05-04 DIAGNOSIS — R42 Dizziness and giddiness: Secondary | ICD-10-CM | POA: Insufficient documentation

## 2022-05-04 DIAGNOSIS — E119 Type 2 diabetes mellitus without complications: Secondary | ICD-10-CM | POA: Diagnosis not present

## 2022-05-04 DIAGNOSIS — G8929 Other chronic pain: Secondary | ICD-10-CM | POA: Diagnosis not present

## 2022-05-04 DIAGNOSIS — M109 Gout, unspecified: Secondary | ICD-10-CM | POA: Diagnosis not present

## 2022-05-04 MED ORDER — AMOXICILLIN-POT CLAVULANATE 875-125 MG PO TABS
1.0000 | ORAL_TABLET | Freq: Two times a day (BID) | ORAL | 0 refills | Status: AC
  Filled 2022-05-04 – 2022-05-05 (×2): qty 20, 10d supply, fill #0

## 2022-05-04 NOTE — Progress Notes (Signed)
[facility] OFF[facility] PROGRESS NOTE   Diagnosis: Non-Hodgkin's lymphoma  INTERVAL HISTORY:   [name] returns as scheduled.  Good appetite.  No fever.  He has an occasional night sweat.  He is not monitoring his blood sugar.  He has an intermittent abnormal sensation at the upper anterior chest.  He is followed by cardiology.  Submandibular nodule is sometimes sore.  No new lymph nodes. He has recently developed dizziness when going from lying to standing. Objective:  Vital signs in last 24 hours:  Blood pressure 134/80, pulse 82, temperature 97.9 F (36.6 C), temperature source Oral, resp. rate 18, height 6\' 2"  (1.88 m), weight 251 lb 9.6 oz (114.1 kg), SpO2 98 %.    HEENT: 1.5 cm mobile nodular lesion in the right submandibular region Lymphatics: No cervical, supraclavicular, axillary, or inguinal nodes Resp: Bronchial sounds at the left posterior chest, no respiratory distress Cardio: Giller rate and rhythm GI: No hepatosplenomegaly Vascular: No leg edema   Lab Results:  Lab Results  Component Value Date   WBC 7.3 01/06/2022   HGB 14.9 01/06/2022   HCT 43.8 01/06/2022   MCV 89.1 01/06/2022   PLT 240.0 01/06/2022   NEUTROABS 4.2 12/02/2018    CMP  Lab Results  Component Value Date   NA 138 01/06/2022   K 4.4 01/06/2022   CL 101 01/06/2022   CO2 29 01/06/2022   GLUCOSE 94 01/06/2022   BUN 15 01/06/2022   CREATININE 0.72 01/06/2022   CALCIUM 9.3 01/06/2022   PROT 6.3 01/06/2022   ALBUMIN 4.1 01/06/2022   AST 16 01/06/2022   ALT 13 01/06/2022   ALKPHOS 71 01/06/2022   BILITOT 0.7 01/06/2022   GFRNONAA >60 06/11/2021   GFRAA >60 01/18/2018     Medications: I have reviewed the patient's current medications.   Assessment/Plan: High-grade B-cell non-Hodgkin's lymphoma, molecular studies pending, clinical stage IV, IPI- low intermediate risk, FISH panel revealed an [id number] fusion (MYC/IgH), negative for BCL 6 and BCL-2 Chest x-ray 01/15/2017  - medial right upper lobe/perihilar mass.   Chest CT 01/19/2017 - mass constricting the superior vena cava, subcarinal adenopathy, right suprahilar/mediastinal mass.   01/24/2017 status post bronchoscopy with transbronchial biopsy of level 7 node and biopsy of a right upper lobe lung mass by Dr. Servando Snare.  There was compression of the right mainstem bronchus.  A mass was noted in the right upper lobe.  Mediastinal lymph nodes were seen on EBUS.  Multiple biopsies of a level 7 node were obtained.  Brushings and a biopsy of the right upper lobe mass were obtained.  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  Final pathology was nondiagnostic.   PET scan on 02/02/2017 showed hypermetabolism corresponding to the right sided mediastinal mass, progressive since the CT 01/19/2017.  Metastatic disease/lymphoma within the bones, pericardium, stomach, bowel and abdominopelvic nodal stations.   Brain MRI 02/03/2017 showed no evidence of intracranial metastases or acute abnormality.   02/05/2017 status post mediastinoscopy.   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  Molecular studies are pending. Rituximab 02/08/2017 Cycle 1 CHOP 02/09/2017 Cycle 1 R-EPOCH 03/02/2017 Cycle 2 R-EPOCH 03/23/2017 Prophylactic intrathecal methotrexate 03/27/2017 Restaging PET scan 04/11/2017-significant decrease in FDG uptake associated with the chest, abdomen and pelvis and axial and proximal appendicular skeleton.  No new foci of abnormal radiotracer uptake identified. Cycle 3 R-EPOCH 04/13/2017 with intrathecal methotrexate prophylaxis Cycle 4 R-EPOCH 05/04/2017 with intrathecal methotrexate prophylaxis Cycle 5 R- [facility] 05/25/2017 with intrathecal  methotrexate prophylaxis   Chest/upper back and right arm pain secondary to #1- resolved. Exertional dyspnea secondary to #1 and COPD.  Improved. Asthma Diabetes mellitus Gout Remote history of "carcinoid syndrome" Remote history of tobacco use Basal  cell carcinoma removed from the right face Chronic low back pain status post epidural steroid injections-followed at a pain management clinic L4-5 decompression and fusion 01/17/2018 SVC syndrome secondary to #1- resolved Constipation secondary to narcotic analgesics and potentially lymphoma involving the GI tract-improved. 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      Disposition: Mr. Jewitt is in clinical remission from non-Hodgkin's lymphoma.  He will follow-up with Dr. Jerline Pain to evaluate the positional dizziness.  He will continue follow-up with cardiology for chest discomfort.  Mr. Tenorio would like to continue follow-up in the oncology clinic.  He will return for an office visit in 6 months.  Betsy Coder, MD  05/04/2022  8:15 AM

## 2022-05-04 NOTE — Telephone Encounter (Signed)
Ok to send in 1 refill of augmentin 875mg  bid x 10 days.  Needs office visit if not improving.  Algis Greenhouse. Jerline Pain, MD 05/04/2022 3:24 PM

## 2022-05-05 ENCOUNTER — Other Ambulatory Visit (HOSPITAL_COMMUNITY): Payer: Self-pay

## 2022-05-07 ENCOUNTER — Other Ambulatory Visit: Payer: Self-pay | Admitting: Family Medicine

## 2022-05-08 ENCOUNTER — Other Ambulatory Visit: Payer: Self-pay

## 2022-05-08 MED ORDER — TRAZODONE HCL 50 MG PO TABS
25.0000 mg | ORAL_TABLET | Freq: Every evening | ORAL | 3 refills | Status: DC | PRN
  Filled 2022-05-08: qty 30, 30d supply, fill #0
  Filled 2022-06-03: qty 30, 30d supply, fill #1
  Filled 2022-07-09: qty 30, 30d supply, fill #2
  Filled 2022-08-22: qty 30, 30d supply, fill #3

## 2022-05-11 ENCOUNTER — Other Ambulatory Visit (HOSPITAL_COMMUNITY): Payer: Self-pay

## 2022-05-11 ENCOUNTER — Other Ambulatory Visit: Payer: Self-pay

## 2022-05-11 ENCOUNTER — Other Ambulatory Visit: Payer: Self-pay | Admitting: Family Medicine

## 2022-05-11 MED ORDER — LEVALBUTEROL TARTRATE 45 MCG/ACT IN AERO
2.0000 | INHALATION_SPRAY | Freq: Every day | RESPIRATORY_TRACT | 1 refills | Status: DC | PRN
  Filled 2022-05-11: qty 15, 25d supply, fill #0
  Filled 2022-07-09: qty 15, 25d supply, fill #1

## 2022-05-15 ENCOUNTER — Other Ambulatory Visit: Payer: Self-pay

## 2022-05-15 ENCOUNTER — Encounter: Payer: Self-pay | Admitting: Family Medicine

## 2022-05-15 ENCOUNTER — Other Ambulatory Visit (HOSPITAL_COMMUNITY): Payer: Self-pay

## 2022-05-15 ENCOUNTER — Ambulatory Visit: Payer: Medicare PPO | Admitting: Family Medicine

## 2022-05-15 VITALS — BP 125/77 | HR 65 | Temp 98.4°F | Ht 74.0 in | Wt 253.2 lb

## 2022-05-15 DIAGNOSIS — J309 Allergic rhinitis, unspecified: Secondary | ICD-10-CM | POA: Diagnosis not present

## 2022-05-15 DIAGNOSIS — I152 Hypertension secondary to endocrine disorders: Secondary | ICD-10-CM | POA: Diagnosis not present

## 2022-05-15 DIAGNOSIS — R739 Hyperglycemia, unspecified: Secondary | ICD-10-CM

## 2022-05-15 DIAGNOSIS — E1159 Type 2 diabetes mellitus with other circulatory complications: Secondary | ICD-10-CM | POA: Diagnosis not present

## 2022-05-15 LAB — POCT GLYCOSYLATED HEMOGLOBIN (HGB A1C): Hemoglobin A1C: 5.7 % — AB (ref 4.0–5.6)

## 2022-05-15 MED ORDER — AZELASTINE HCL 0.1 % NA SOLN
2.0000 | Freq: Two times a day (BID) | NASAL | 12 refills | Status: DC
  Filled 2022-05-15: qty 30, 50d supply, fill #0
  Filled 2022-07-09: qty 30, 50d supply, fill #1
  Filled 2022-09-20: qty 30, 50d supply, fill #2
  Filled 2022-11-06: qty 30, 50d supply, fill #3
  Filled 2023-05-13: qty 30, 50d supply, fill #4

## 2022-05-15 NOTE — Patient Instructions (Signed)
It was very nice to see you today!  You are doing great!  No medication changes today.   We will see you back in 6 months for your annual physical with labs. Come back sooner if needed.   Take care, Dr Jimmey Ralph  PLEASE NOTE:  If you had any lab tests, please let us know if you have not heard back within a few days. You may see your results on mychart before we have a chance to review them but we will give you a call once they are reviewed by [place].   If we ordered any referrals today, please let us know if you have not heard from their office within the next week.   If you had any urgent prescriptions sent in today, please check with the pharmacy within an hour of our visit to make sure the prescription was transmitted appropriately.   Please try these tips to maintain a healthy lifestyle:  Eat at least 3 REAL meals and 1-2 snacks per day.  Aim for no more than 5 hours between eating.  If you eat breakfast, please do so within one hour of getting up.   Each meal should contain half fruits/vegetables, one quarter protein, and one quarter carbs (no bigger than a computer mouse)  Cut down on sweet beverages. This includes juice, soda, and sweet tea.   Drink at least 1 glass of water with each meal and aim for at least 8 glasses per day  Exercise at least 150 minutes every week.

## 2022-05-15 NOTE — Assessment & Plan Note (Signed)
Not controlled.  He is taking over-the-counter Claritin.  Will add on Astelin.  He will let us know if not improving over the next 1 to 2 weeks.

## 2022-05-15 NOTE — Assessment & Plan Note (Signed)
At goal today on Imdur 15 mg daily and metoprolol succinate 25 mg daily.

## 2022-05-15 NOTE — Progress Notes (Signed)
   Jeffery Wood is a 67 y.o. male who presents today for an office visit.  Assessment/Plan:  Chronic Problems Addressed Today: Hyperglycemia A1c 5.7.  Doing well with metformin 1000 mg twice daily and amaryl 2mg  daily.   We discussed lifestyle modifications.  We will recheck in 6 months at CPE.  Hypertension associated with diabetes (HCC) At goal today on Imdur 15 mg daily and metoprolol succinate 25 mg daily.   Allergic rhinitis Not controlled.  He is taking over-the-counter Claritin.  Will add on Astelin.  He will let us know if not improving over the next 1 to 2 weeks.     Subjective:  HPI:  See A/p for status of chronic conditions.  He has no acute concerns today.       Objective:  Physical Exam: BP 125/77   Pulse 65   Temp 98.4 F (36.9 C) (Temporal)   Ht 6\' 2"  (1.88 m)   Wt 253 lb 3.2 oz (114.9 kg)   SpO2 97%   BMI 32.51 kg/m   Gen: No acute distress, resting comfortably CV: Regular rate and rhythm with no murmurs appreciated Pulm: Normal work of breathing, clear to auscultation bilaterally with no crackles, wheezes, or rhonchi Neuro: Grossly normal, moves all extremities Psych: Normal affect and thought content      Azora Bonzo M. Jimmey Ralph, MD 05/15/2022 7:48 AM

## 2022-05-15 NOTE — Assessment & Plan Note (Addendum)
A1c 5.7.  Doing well with metformin 1000 mg twice daily and amaryl 2mg  daily.   We discussed lifestyle modifications.  We will recheck in 6 months at CPE.

## 2022-05-29 ENCOUNTER — Other Ambulatory Visit: Payer: Self-pay | Admitting: Internal Medicine

## 2022-05-29 ENCOUNTER — Other Ambulatory Visit: Payer: Self-pay

## 2022-05-29 ENCOUNTER — Other Ambulatory Visit (HOSPITAL_COMMUNITY): Payer: Self-pay

## 2022-05-29 MED ORDER — PANTOPRAZOLE SODIUM 40 MG PO TBEC
40.0000 mg | DELAYED_RELEASE_TABLET | Freq: Every day | ORAL | 1 refills | Status: DC
  Filled 2022-05-29: qty 90, 90d supply, fill #0
  Filled 2022-08-27: qty 90, 90d supply, fill #1

## 2022-06-03 ENCOUNTER — Other Ambulatory Visit (HOSPITAL_COMMUNITY): Payer: Self-pay

## 2022-06-05 ENCOUNTER — Other Ambulatory Visit (HOSPITAL_COMMUNITY): Payer: Self-pay

## 2022-06-05 ENCOUNTER — Other Ambulatory Visit: Payer: Self-pay

## 2022-06-05 MED ORDER — TRAMADOL HCL 50 MG PO TABS
50.0000 mg | ORAL_TABLET | Freq: Three times a day (TID) | ORAL | 1 refills | Status: DC | PRN
  Filled 2022-06-05: qty 50, 17d supply, fill #0
  Filled 2022-07-15: qty 50, 17d supply, fill #1

## 2022-06-06 ENCOUNTER — Other Ambulatory Visit: Payer: Self-pay | Admitting: Family Medicine

## 2022-06-07 ENCOUNTER — Other Ambulatory Visit (HOSPITAL_COMMUNITY): Payer: Self-pay

## 2022-06-07 ENCOUNTER — Other Ambulatory Visit (HOSPITAL_BASED_OUTPATIENT_CLINIC_OR_DEPARTMENT_OTHER): Payer: Self-pay

## 2022-06-07 MED ORDER — METFORMIN HCL 1000 MG PO TABS
1000.0000 mg | ORAL_TABLET | Freq: Two times a day (BID) | ORAL | 0 refills | Status: DC
  Filled 2022-06-07: qty 180, 90d supply, fill #0

## 2022-06-08 ENCOUNTER — Ambulatory Visit: Payer: Medicare PPO | Admitting: Internal Medicine

## 2022-06-23 ENCOUNTER — Other Ambulatory Visit: Payer: Self-pay | Admitting: Family Medicine

## 2022-06-23 ENCOUNTER — Other Ambulatory Visit (HOSPITAL_COMMUNITY): Payer: Self-pay

## 2022-06-23 ENCOUNTER — Other Ambulatory Visit: Payer: Self-pay | Admitting: Nurse Practitioner

## 2022-06-23 MED ORDER — METOPROLOL SUCCINATE ER 25 MG PO TB24
25.0000 mg | ORAL_TABLET | Freq: Every day | ORAL | 2 refills | Status: DC
  Filled 2022-06-23 – 2022-07-24 (×2): qty 90, 90d supply, fill #0

## 2022-06-24 ENCOUNTER — Other Ambulatory Visit (HOSPITAL_COMMUNITY): Payer: Self-pay

## 2022-06-27 ENCOUNTER — Other Ambulatory Visit: Payer: Self-pay

## 2022-06-27 ENCOUNTER — Other Ambulatory Visit: Payer: Self-pay | Admitting: Nurse Practitioner

## 2022-06-27 ENCOUNTER — Other Ambulatory Visit (HOSPITAL_COMMUNITY): Payer: Self-pay

## 2022-06-27 MED ORDER — ATORVASTATIN CALCIUM 40 MG PO TABS
40.0000 mg | ORAL_TABLET | Freq: Every day | ORAL | 2 refills | Status: DC
  Filled 2022-06-27: qty 90, 90d supply, fill #0
  Filled 2022-09-24: qty 90, 90d supply, fill #1
  Filled 2022-12-19: qty 90, 90d supply, fill #2

## 2022-06-27 MED ORDER — DONEPEZIL HCL 5 MG PO TABS
5.0000 mg | ORAL_TABLET | Freq: Every day | ORAL | 1 refills | Status: DC
  Filled 2022-06-27: qty 30, 30d supply, fill #0
  Filled 2022-07-24: qty 30, 30d supply, fill #1

## 2022-06-27 MED ORDER — DULOXETINE HCL 30 MG PO CPEP
30.0000 mg | ORAL_CAPSULE | Freq: Every day | ORAL | 1 refills | Status: DC
  Filled 2022-06-27 – 2022-08-09 (×4): qty 90, 90d supply, fill #0
  Filled 2022-11-07: qty 90, 90d supply, fill #1

## 2022-07-03 ENCOUNTER — Other Ambulatory Visit (HOSPITAL_COMMUNITY): Payer: Self-pay

## 2022-07-03 ENCOUNTER — Other Ambulatory Visit: Payer: Self-pay

## 2022-07-03 ENCOUNTER — Ambulatory Visit: Payer: Medicare PPO | Admitting: Podiatry

## 2022-07-03 DIAGNOSIS — M2041 Other hammer toe(s) (acquired), right foot: Secondary | ICD-10-CM

## 2022-07-03 DIAGNOSIS — M2031 Hallux varus (acquired), right foot: Secondary | ICD-10-CM

## 2022-07-03 DIAGNOSIS — B351 Tinea unguium: Secondary | ICD-10-CM | POA: Diagnosis not present

## 2022-07-03 DIAGNOSIS — L97511 Non-pressure chronic ulcer of other part of right foot limited to breakdown of skin: Secondary | ICD-10-CM

## 2022-07-03 DIAGNOSIS — M21611 Bunion of right foot: Secondary | ICD-10-CM | POA: Diagnosis not present

## 2022-07-03 DIAGNOSIS — M21961 Unspecified acquired deformity of right lower leg: Secondary | ICD-10-CM

## 2022-07-03 MED ORDER — CEPHALEXIN 500 MG PO CAPS
500.0000 mg | ORAL_CAPSULE | Freq: Three times a day (TID) | ORAL | 0 refills | Status: DC
  Filled 2022-07-03 – 2022-07-06 (×4): qty 21, 7d supply, fill #0

## 2022-07-03 NOTE — Patient Instructions (Signed)
Pre-Operative Instructions  Congratulations, you have decided to take an important step to improving your quality of life.  You can be assured that the doctors of [facility] will be with you every step of the way.  Plan to be at the surgery center/hospital at least 1 (one) hour prior to your scheduled time unless otherwise directed by the surgical center/hospital staff.  You must have a responsible adult accompany you, remain during the surgery and drive you home.  Make sure you have directions to the surgical center/hospital and know how to get there on time. For hospital based surgery you will need to obtain a history and physical form from your family physician within 1 month prior to the date of surgery- we will give you a form for you primary physician.  We make every effort to accommodate the date you request for surgery.  There are however, times where surgery dates or times have to be moved.  We will contact you as soon as possible if a change in schedule is required.   No Aspirin/Ibuprofen for one week before surgery.  If you are on aspirin, any non-steroidal anti-inflammatory medications (Mobic, Aleve, Ibuprofen) you should stop taking it 7 days prior to your surgery.  You make take Tylenol  For pain prior to surgery.  Medications- If you are taking daily heart and blood pressure medications, seizure, reflux, allergy, asthma, anxiety, pain or diabetes medications, make sure the surgery center/hospital is aware before the day of surgery so they may notify you which medications to take or avoid the day of surgery. No food or drink after midnight the night before surgery unless directed otherwise by surgical center/hospital staff. No alcoholic beverages 24 hours prior to surgery.  No smoking 24 hours prior to or 24 hours after surgery. Wear loose pants or shorts- loose enough to fit over bandages, boots, and casts. No slip on shoes, sneakers are best. Bring your boot with you to the  surgery center/hospital.  Also bring crutches or a walker if your physician has prescribed it for you.  If you do not have this equipment, it will be provided for you after surgery. If you have not been contracted by the surgery center/hospital by the day before your surgery, call to confirm the date and time of your surgery. Leave-time from work may vary depending on the type of surgery you have.  Appropriate arrangements should be made prior to surgery with your employer. Prescriptions will be provided immediately following surgery by your doctor.  Have these filled as soon as possible after surgery and take the medication as directed. Remove nail polish on the operative foot. Wash the night before surgery.  The night before surgery wash the foot and leg well with the antibacterial soap provided and water paying special attention to beneath the toenails and in between the toes.  Rinse thoroughly with water and dry well with a towel.  Perform this wash unless told not to do so by your physician.  Enclosed: 1 Ice pack (please put in freezer the night before surgery)   1 Hibiclens skin cleaner   Pre-op Instructions  If you have any questions regarding the instructions, do not hesitate to call our office at any point during this process.   [place]: [address] [place], [place] [address] [phone]  [place]: [address] [place], [place] [address] [phone]  Dr. Andi Mahaffy, DPM  

## 2022-07-03 NOTE — Progress Notes (Addendum)
Subjective: Chief Complaint  Patient presents with   Bunions    Right foot    67 year old male concerns.  He been getting discussed wearing For surgery.  He gets pain on the bunion area but also goes along the foot points on the second, third MPJ area.  No recent injury or changes.  Tried shoe modifications, offloading without any significant improvement.  Nails are still thick.  He tried trimming nails and cut himself on the toe.  Objective: AAO x3, NAD DP/PT pulses palpable bilaterally, CRT less than 3 seconds Bunions present to right foot with hallux malleus present.  Hammertoes are present lesser digits and medial tenderness on the second, third MPJ.  There is no erythema point tenderness otherwise.  Flexor, extensor tendons appear to be intact.  MMT 5/5. He tried trimming his nails and has superficial abrasion on the right second toe without any erythema or warmth.  There is minimal edema.  Also small superficial skin tear on the plantar aspect the foot. No pain with calf compression, swelling, warmth, erythema  Assessment: 67 year old male with bunion, metatarsal deformity, hammertoes;  onychomycosis  Plan: -All treatment options discussed with the patient including all alternatives, risks, complications.  -I again reviewed the x-rays with him.  3 views of the right foot were reviewed. There is a moderate bunion deformity with intermetatarsal angle of 11 degrees.  Hallux malleus is present.  There is cystic changes present on medial aspect of first metatarsal head. -We both discussed conservative versus surgical options.  He has been having ongoing foot pain for quite some tim, greater been 3 months.  I had previously seen him as well in October 2023 for the same issue. He is attempted numerous conservative treatments including shoe modifications, offloading, anti-inflammatories and despite this he continues to have pain and difficulty walking because of this.   -I do think some of this  could be neuropathy other issues.  Still gets pain but it is musculoskeletal in nature as well.  I discussed with him Serafina Royals with IPJ fusion as well as hammertoe repair of the lesser digits and metatarsal osteotomy of the second and third.  After discussion he wishes to proceed. -The incision placement as well as the postoperative course was discussed with the patient. I discussed risks of the surgery which include, but not limited to, infection, bleeding, pain, swelling, need for further surgery, delayed or nonhealing, painful or ugly scar, numbness or sensation changes, over/under correction, recurrence, transfer lesions, further deformity, hardware failure, DVT/PE, loss of toe/foot. Patient understands these risks and wishes to proceed with surgery. The surgical consent was reviewed with the patient all 3 pages were signed. No promises or guarantees were given to the outcome of the procedure. All questions were answered to the best of my ability. Before the surgery the patient was encouraged to call the office if there is any further questions. The surgery will be performed at the [facility] on an outpatient basis. -Debrided the nails as a movement for culture, pathology -Given the superficial laceration of the second toe as well as the plantar foot start Keflex.  Medical nutrition to use daily. -Monitor for any clinical signs or symptoms of infection and directed to call the office immediately should any occur or go to the ER. -Patient encouraged to call the office with any questions, concerns, change in symptoms.   [name] DPM

## 2022-07-04 LAB — OPHTHALMOLOGY REPORT-SCANNED

## 2022-07-06 ENCOUNTER — Other Ambulatory Visit: Payer: Self-pay

## 2022-07-06 ENCOUNTER — Other Ambulatory Visit (HOSPITAL_BASED_OUTPATIENT_CLINIC_OR_DEPARTMENT_OTHER): Payer: Self-pay

## 2022-07-06 ENCOUNTER — Other Ambulatory Visit (HOSPITAL_COMMUNITY): Payer: Self-pay

## 2022-07-06 ENCOUNTER — Encounter: Payer: Self-pay | Admitting: Oncology

## 2022-07-06 ENCOUNTER — Telehealth: Payer: Self-pay | Admitting: Internal Medicine

## 2022-07-06 ENCOUNTER — Encounter: Payer: Self-pay | Admitting: Podiatry

## 2022-07-06 NOTE — Telephone Encounter (Signed)
     Pre-operative Risk Assessment    Patient Name: Jeffery Wood  DOB: 15-Jan-1956 MRN: [id number]      Request for Surgical Clearance    Procedure:   austin bunionectomy with IPJ fusion hammer toe repair metatarsal osteotomy 2nd and 3rd toe right foot   Date of Surgery:  Clearance 08/16/22                                 Surgeon:  Dr. Jillyn Ledger  Surgeon's Group or Practice Name:  [facility] Phone number:  [phone] Fax number:  [phone]   Type of Clearance Requested:   - Medical  - Pharmacy:  Hold defer to cards       Type of Anesthesia:   CHOICE   Additional requests/questions:    Signed, Noe Gens   07/06/2022, 2:26 PM

## 2022-07-07 ENCOUNTER — Telehealth: Payer: Self-pay | Admitting: *Deleted

## 2022-07-07 NOTE — Telephone Encounter (Signed)
Pt has been scheduled for tele pre op appt 07/24/22 @ 9 am. Med rec and consent are done. Pt states his surgery may get moved up if they get a cancellation. I stated to the pt that I will put in my notes for the requesting office if surgery is moved up to please call our office ASAP as we may need to move tele appt for pre op clearance. Pt will also call and let us know if surgery has been moved up.

## 2022-07-07 NOTE — Telephone Encounter (Signed)
Pt contacted to schedule tele for preop. No answer lvm

## 2022-07-07 NOTE — Telephone Encounter (Signed)
   Name: [name]  DOB: [birth year]  MRN: [id number]  Primary Cardiologist: Dietrich Pates, MD  Chart reviewed as part of pre-operative protocol coverage. Because of Demarko Zeimet Rud's past medical history and time since last visit, he will require a follow-up telephone visit in order to better assess preoperative cardiovascular risk.  Pre-op covering staff: - Please schedule appointment and call patient to inform them. If patient already had an upcoming appointment within acceptable timeframe, please add "pre-op clearance" to the appointment notes so provider is aware. - Please contact requesting surgeon's office via preferred method (i.e, phone, fax) to inform them of need for appointment prior to surgery.  With last PCI 05/2021, if patient is asymptomatic at the time of phone call can hold Plavix x 5 days prior to the procedure.  Would prefer continuing aspirin throughout.  Sharlene Dory, PA-C  07/07/2022, 8:22 AM

## 2022-07-07 NOTE — Telephone Encounter (Signed)
Pt has been scheduled for tele pre op appt 07/24/22 @ 9 am. Med rec and consent are done. Pt states his surgery may get moved up if they get a cancellation. I stated to the pt that I will put in my notes for the requesting office if surgery is moved up to please call our office ASAP as we may need to move tele appt for pre op clearance. Pt will also call and let us know if surgery has been moved up.      Patient Consent for Virtual Visit        Jeffery Wood has provided verbal consent on 07/07/2022 for a virtual visit (video or telephone).   CONSENT FOR VIRTUAL VISIT FOR:  Jeffery Wood  By participating in this virtual visit I agree to the following:  I hereby voluntarily request, consent and authorize [facility] and its employed or contracted physicians, physician assistants, nurse practitioners or other licensed health care professionals (the Practitioner), to provide me with telemedicine health care services (the "Services") as deemed necessary by the treating Practitioner. I acknowledge and consent to receive the Services by the Practitioner via telemedicine. I understand that the telemedicine visit will involve communicating with the Practitioner through live audiovisual communication technology and the disclosure of certain medical information by electronic transmission. I acknowledge that I have been given the opportunity to request an in-person assessment or other available alternative prior to the telemedicine visit and am voluntarily participating in the telemedicine visit.  I understand that I have the right to withhold or withdraw my consent to the use of telemedicine in the course of my care at any time, without affecting my right to future care or treatment, and that the Practitioner or I may terminate the telemedicine visit at any time. I understand that I have the right to inspect all information obtained and/or recorded in the course of the telemedicine visit and may  receive copies of available information for a reasonable fee.  I understand that some of the potential risks of receiving the Services via telemedicine include:  Delay or interruption in medical evaluation due to technological equipment failure or disruption; Information transmitted may not be sufficient (e.g. poor resolution of images) to allow for appropriate medical decision making by the Practitioner; and/or  In rare instances, security protocols could fail, causing a breach of personal health information.  Furthermore, I acknowledge that it is my responsibility to provide information about my medical history, conditions and care that is complete and accurate to the best of my ability. I acknowledge that Practitioner's advice, recommendations, and/or decision may be based on factors not within their control, such as incomplete or inaccurate data provided by me or distortions of diagnostic images or specimens that may result from electronic transmissions. I understand that the practice of medicine is not an exact science and that Practitioner makes no warranties or guarantees regarding treatment outcomes. I acknowledge that a copy of this consent can be made available to me via my patient portal [facility] MyChart), or I can request a printed copy by calling the office of [facility].    I understand that my insurance will be billed for this visit.   I have read or had this consent read to me. I understand the contents of this consent, which adequately explains the benefits and risks of the Services being provided via telemedicine.  I have been provided ample opportunity to ask questions regarding this consent and the Services  and have had my questions answered to my satisfaction. I give my informed consent for the services to be provided through the use of telemedicine in my medical care

## 2022-07-10 ENCOUNTER — Other Ambulatory Visit: Payer: Self-pay

## 2022-07-11 DIAGNOSIS — L821 Other seborrheic keratosis: Secondary | ICD-10-CM | POA: Diagnosis not present

## 2022-07-11 DIAGNOSIS — L570 Actinic keratosis: Secondary | ICD-10-CM | POA: Diagnosis not present

## 2022-07-11 DIAGNOSIS — Z85828 Personal history of other malignant neoplasm of skin: Secondary | ICD-10-CM | POA: Diagnosis not present

## 2022-07-11 DIAGNOSIS — D692 Other nonthrombocytopenic purpura: Secondary | ICD-10-CM | POA: Diagnosis not present

## 2022-07-17 ENCOUNTER — Telehealth: Payer: Self-pay | Admitting: *Deleted

## 2022-07-17 ENCOUNTER — Other Ambulatory Visit: Payer: Self-pay

## 2022-07-17 NOTE — Telephone Encounter (Signed)
[facility] surgery clearance faxed today to [phone] Form placed to be scan in patient chart

## 2022-07-18 ENCOUNTER — Encounter: Payer: Self-pay | Admitting: Podiatry

## 2022-07-20 ENCOUNTER — Encounter: Payer: Self-pay | Admitting: Podiatry

## 2022-07-22 ENCOUNTER — Other Ambulatory Visit: Payer: Self-pay | Admitting: Family Medicine

## 2022-07-22 ENCOUNTER — Other Ambulatory Visit (HOSPITAL_COMMUNITY): Payer: Self-pay

## 2022-07-24 ENCOUNTER — Other Ambulatory Visit (HOSPITAL_COMMUNITY): Payer: Self-pay

## 2022-07-24 ENCOUNTER — Ambulatory Visit (INDEPENDENT_AMBULATORY_CARE_PROVIDER_SITE_OTHER): Payer: Medicare PPO

## 2022-07-24 ENCOUNTER — Other Ambulatory Visit: Payer: Self-pay

## 2022-07-24 ENCOUNTER — Telehealth: Payer: Self-pay | Admitting: Internal Medicine

## 2022-07-24 DIAGNOSIS — Z01810 Encounter for preprocedural cardiovascular examination: Secondary | ICD-10-CM

## 2022-07-24 MED ORDER — BETAMETHASONE DIPROPIONATE AUG 0.05 % EX CREA
1.0000 | TOPICAL_CREAM | CUTANEOUS | 99 refills | Status: DC
  Filled 2022-07-24: qty 50, 30d supply, fill #1
  Filled 2022-07-24: qty 50, 30d supply, fill #0
  Filled 2022-09-20: qty 50, 30d supply, fill #1

## 2022-07-24 NOTE — Telephone Encounter (Signed)
Pt had an Telephone Office Visit with [facility] Pre-Op Clearance but he missed it and is requesting a callback to get another appt. Please advise.

## 2022-07-24 NOTE — Telephone Encounter (Signed)
I s/w pre op APP today Edd Fabian, FNP who states the pt will need to reschedule his tele appt. Pt has been rescheduled to 07/27/22 @ 9:20.

## 2022-07-24 NOTE — Progress Notes (Signed)
Attempted to contact patient x 4 as part of preoperative protocol.  Voice messages left with each call.  Patient will need to reschedule preoperative phone appointment.  Thomasene Ripple. Treyvonne Tata NP-C     07/24/2022, 10:40 AM [facility] [address] Office [phone] Fax [phone]

## 2022-07-25 ENCOUNTER — Other Ambulatory Visit: Payer: Self-pay

## 2022-07-27 ENCOUNTER — Ambulatory Visit: Payer: Medicare PPO | Attending: Cardiology

## 2022-07-27 DIAGNOSIS — Z01810 Encounter for preprocedural cardiovascular examination: Secondary | ICD-10-CM | POA: Diagnosis not present

## 2022-07-27 NOTE — Progress Notes (Signed)
Virtual Visit via Telephone Note   Because of Jeffery Wood's co-morbid illnesses, he is at least at moderate risk for complications without adequate follow up.  This format is felt to be most appropriate for this patient at this time.  The patient did not have access to video technology/had technical difficulties with video requiring transitioning to audio format only (telephone).  All issues noted in this document were discussed and addressed.  No physical exam could be performed with this format.  Please refer to the patient's chart for his consent to telehealth for [name].  Evaluation Performed:  Preoperative cardiovascular risk assessment _____________   Date:  07/27/2022   Patient ID:  Jeffery Wood, DOB Mar 28, 1955, MRN [id number] Patient Location:  Home Provider location:   Office  Primary Care Provider:  Ardith Dark, MD Primary Cardiologist:  Dietrich Pates, MD  Chief Complaint / Patient Profile   67 y.o. y/o male with a h/o CAD s/p DES to mid LAD, distal LAD, first diagonal ostium, large cell lymphoma, COPD, HTN, DM type II  who is pending bunionectomy and presents today for telephonic preoperative cardiovascular risk assessment.  History of Present Illness    Jeffery Wood is a 67 y.o. male who presents via audio/video conferencing for a telehealth visit today.  Pt was last seen in cardiology clinic on 03/03/2022 by Robin Searing, NP. At that time Jeffery Wood was doing well with occasional bouts of chest pain.  He had Imdur 15 mg added to his medication regimen. The patient is now pending procedure as outlined above. Since his last visit, he has been doing well and has noticed a significant difference with isosorbide.  He is staying active and is able to do activities such as cut his grass and carry items such as bottled water without anginal pain.   Past Medical History    Past Medical History:  Diagnosis Date   Arthritis    Asthma    Basal cell  carcinoma (BCC) of right temple region    CAD (coronary artery disease) 2023   5v   Cancer (HCC)    basal cell of right temple; carcinoids   Complication of anesthesia    hard to wake up after bronchoscopy   Diabetes (HCC)    type 2   Dyspnea    GERD (gastroesophageal reflux disease)    Gout    resolved, no current problem   Headache    History of hiatal hernia 05/16/2006   small noted on EGD   Hypertension    hx   Lung mass    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, in remission-chest, but all over   Memory loss    mild - r/t chemo, 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   Neuromuscular disorder (HCC)    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   Sleep apnea    does not use anything for OSA   Past Surgical History:  Procedure Laterality Date   BACK SURGERY     L4-L5 bracket and screws   BICEPS TENDON REPAIR Left    CARPAL TUNNEL RELEASE     COLONOSCOPY     CORONARY BALLOON ANGIOPLASTY N/A 06/10/2021   Procedure: CORONARY BALLOON ANGIOPLASTY;  Surgeon: Corky Crafts, MD;  Location: [facility] INVASIVE CV LAB;  Service: Cardiovascular;  Laterality: N/A;   CORONARY STENT INTERVENTION N/A 06/10/2021   Procedure: CORONARY STENT INTERVENTION;  Surgeon: Corky Crafts, MD;  Location: [facility] INVASIVE CV LAB;  Service: Cardiovascular;  Laterality: N/A;   CORONARY ULTRASOUND/IVUS N/A 06/10/2021   Procedure: Intravascular Ultrasound/IVUS;  Surgeon: Corky Crafts, MD;  Location: [facility] INVASIVE CV LAB;  Service: Cardiovascular;  Laterality: N/A;   FINGER SURGERY     KNEE ARTHROSCOPY     ACL tear   LEFT HEART CATH AND CORONARY ANGIOGRAPHY N/A 06/10/2021   Procedure: LEFT HEART CATH AND CORONARY ANGIOGRAPHY;  Surgeon: Corky Crafts, MD;  Location: [facility] INVASIVE CV LAB;  Service: Cardiovascular;  Laterality: N/A;   LEFT HEART CATHETERIZATION WITH CORONARY ANGIOGRAM N/A 12/07/2010   Procedure: LEFT HEART CATHETERIZATION WITH CORONARY ANGIOGRAM;  Surgeon: Marykay Lex, MD;   Location: [facility] CATH LAB;  Service: Cardiovascular;  Laterality: N/A;   LUMBAR LAMINECTOMY/DECOMPRESSION MICRODISCECTOMY Right 12/09/2020   Procedure: MICRODISCECTOMY, RIGHT LUMBAR TWO-THREE, LUMBAR THREE-FOUR;  Surgeon: Tressie Stalker, MD;  Location: [facility] OR;  Service: Neurosurgery;  Laterality: Right;   PILONIDAL CYST EXCISION     PORT-A-CATH REMOVAL N/A 11/09/2017   Procedure: REMOVAL PORT-A-CATH;  Surgeon: Luretha Murphy, MD;  Location: [facility];  Service: General;  Laterality: N/A;  local   PORTACATH PLACEMENT Left 02/27/2017   Procedure: INSERTION PORT-A-CATH;  Surgeon: Luretha Murphy, MD;  Location: [facility] ORS;  Service: General;  Laterality: Left;   UPPER GASTROINTESTINAL ENDOSCOPY  05/16/2006   VIDEO BRONCHOSCOPY WITH ENDOBRONCHIAL ULTRASOUND N/A 01/24/2017   Procedure: VIDEO BRONCHOSCOPY WITH ENDOBRONCHIAL ULTRASOUND with TRANSBRONCHIAL BIOPSY;  Surgeon: Delight Ovens, MD;  Location: [facility] OR;  Service: Thoracic;  Laterality: N/A;   VIDEO MEDIASTINOSCOPY N/A 02/05/2017   Procedure: VIDEO MEDIASTINOSCOPY;  Surgeon: Delight Ovens, MD;  Location: [facility] OR;  Service: Thoracic;  Laterality: N/A;    Allergies  No Known Allergies  Home Medications    Prior to Admission medications   Medication Sig Start Date End Date Taking? Authorizing Provider  albuterol (PROVENTIL) (2.5 MG/3ML) 0.083% nebulizer solution Take 2.5 mg by nebulization every 4 (four) hours as needed for wheezing or shortness of breath. Patient not taking: Reported on 05/04/2022    [provider]  ALBUTEROL SULFATE HFA IN Inhale into the lungs. AS DIRECTED PRN    [provider]  aspirin EC 81 MG tablet Take 81 mg by mouth daily.    [provider]  atorvastatin (LIPITOR) 40 MG tablet Take 1 tablet (40 mg total) by mouth daily. 06/27/22   Swinyer, Zachary George, NP  augmented betamethasone dipropionate (DIPROLENE-AF) 0.05 % cream Apply 1 Application topically to affected areas on skin  as directed. 07/24/22   Ardith Dark, MD  azelastine (ASTELIN) 0.1 % nasal spray Place 2 sprays into both nostrils 2 (two) times daily. 05/15/22   Ardith Dark, MD  benzonatate (TESSALON) 200 MG capsule Take 1 capsule by mouth 2 times daily as needed for cough. Patient not taking: Reported on 05/04/2022 09/20/21   Ardith Dark, MD  celecoxib (CELEBREX) 200 MG capsule Take 1 capsule (200 mg total) by mouth 2 (two) times daily as needed. 08/29/21   Ardith Dark, MD  cephALEXin (KEFLEX) 500 MG capsule Take 1 capsule (500 mg total) by mouth 3 (three) times daily. 07/03/22   [name], DPM  ciclopirox (PENLAC) 8 % solution Apply topically at bedtime. Apply over nail and surrounding skin. Apply daily over previous coat. After seven (7) days, may remove with alcohol and continue cycle. 11/22/21   [name], DPM  clopidogrel (PLAVIX) 75 MG tablet Take 1 tablet (  75 mg total) by mouth daily with breakfast. 03/27/22   Pricilla Riffle, MD  cyclobenzaprine (FLEXERIL) 10 MG tablet Take 1 tablet (10 mg total) by mouth 3 (three) times daily as needed for muscle spasms. 12/09/20   Val Eagle D, NP  docusate sodium (COLACE) 100 MG capsule Take 1 capsule (100 mg total) by mouth 2 (two) times daily. 12/09/20   Val Eagle D, NP  donepezil (ARICEPT) 5 MG tablet Take 1 tablet (5 mg total) by mouth daily. 06/27/22   Ardith Dark, MD  DULoxetine (CYMBALTA) 30 MG capsule Take 1 capsule (30 mg total) by mouth at bedtime. 06/27/22   Ardith Dark, MD  Fluticasone-Umeclidin-Vilant (TRELEGY ELLIPTA) 100-62.5-25 MCG/ACT AEPB Inhale 1 puff into the lungs daily. Rinse mouth after use Patient not taking: Reported on 07/07/2022 02/28/22   Waymon Budge, MD  gabapentin (NEURONTIN) 300 MG capsule Take 3 capsules (900 mg total) by mouth 3 (three) times daily. Patient taking differently: Take 900 mg by mouth in the morning, at noon, in the evening, and at bedtime. 11/16/21 11/16/22  Ardith Dark, MD   glimepiride (AMARYL) 4 MG tablet Take 1/2 tablet by mouth before biggest meal of the day. 02/27/22   Ardith Dark, MD  isosorbide mononitrate (IMDUR) 30 MG 24 hr tablet Take 1/2 tablet (15 mg total) by mouth daily. 04/03/22   Gaston Islam., NP  levalbuterol [facility] HFA) 45 MCG/ACT inhaler Inhale 2 puffs into the lungs daily as needed for wheezing or shortness of breath. Patient not taking: Reported on 07/07/2022 05/11/22   Ardith Dark, MD  loratadine (CLARITIN) 10 MG tablet Take 10 mg by mouth daily.    [provider]  metFORMIN (GLUCOPHAGE) 1000 MG tablet Take 1 tablet (1,000 mg total) by mouth 2 (two) times daily. 06/07/22   Ardith Dark, MD  metoprolol succinate (TOPROL XL) 25 MG 24 hr tablet Take 1 tablet (25 mg total) by mouth daily. 06/23/22   Pricilla Riffle, MD  Multiple Vitamin (MULTIVITAMIN) capsule Take 1 capsule by mouth daily.    [provider]  nitroGLYCERIN (NITROSTAT) 0.4 MG SL tablet Place 1 tablet (0.4 mg total) under the tongue every 5 (five) minutes as needed for chest pain. Patient not taking: Reported on 05/04/2022 06/11/21   Azalee Course, PA  OVER THE COUNTER MEDICATION Take 1 tablet by mouth daily. Pharmacist, community, Historical, MD  pantoprazole (PROTONIX) 40 MG tablet Take 1 tablet (40 mg total) by mouth daily. 05/29/22   Ardith Dark, MD  tamsulosin (FLOMAX) 0.4 MG CAPS capsule Take 1 capsule (0.4 mg total) by mouth daily. 03/03/22 03/03/23  Ardith Dark, MD  Tiotropium Bromide Monohydrate (SPIRIVA HANDIHALER IN) Inhale into the lungs. AS DIRECTED ONCE A DAY    [provider]  traMADol (ULTRAM) 50 MG tablet Take 1 tablet (50 mg total) by mouth every 8 (eight) hours as needed. 06/05/22     traZODone (DESYREL) 50 MG tablet Take 0.5-1 tablets (25-50 mg total) by mouth at bedtime as needed for sleep. 05/08/22   Ardith Dark, MD    Physical Exam    Vital Signs:  Jeffery Wood does not have vital signs available for review  today.  Given telephonic nature of communication, physical exam is limited. AAOx3. NAD. Normal affect.  Speech and respirations are unlabored.  Accessory Clinical Findings    None  Assessment & Plan    1.  Preoperative  Cardiovascular Risk Assessment:  Patient's RCRI score is 0.9%  The patient affirms he has been doing well without any new cardiac symptoms. They are able to achieve 5 METS without cardiac limitations. Therefore, based on ACC/AHA guidelines, the patient would be at acceptable risk for the planned procedure without further cardiovascular testing. The patient was advised that if he develops new symptoms prior to surgery to contact our office to arrange for a follow-up visit, and he verbalized understanding.   The patient was advised that if he develops new symptoms prior to surgery to contact our office to arrange for a follow-up visit, and he verbalized understanding.  Patient instructed to hold Plavix 5 days prior to procedure and should restart postprocedure when surgically safe and hemostasis is achieved.  A copy of this note will be routed to requesting surgeon.  Time:   Today, I have spent 7 minutes with the patient with telehealth technology discussing medical history, symptoms, and management plan.     Napoleon Form, Leodis Rains, NP  07/27/2022, 6:55 AM

## 2022-08-10 ENCOUNTER — Other Ambulatory Visit (HOSPITAL_COMMUNITY): Payer: Self-pay

## 2022-08-14 ENCOUNTER — Other Ambulatory Visit: Payer: Self-pay

## 2022-08-15 ENCOUNTER — Telehealth: Payer: Self-pay | Admitting: Podiatry

## 2022-08-15 NOTE — Telephone Encounter (Signed)
DOS -  08/16/22   [place] BUNIONECTOMY RT - 78295  [employer] MEDICARE EFFECTIVE DATE - 12/30/2020  DED - N/A OOP - $3000 W/ $2935 REMAINING COINS - 0%  PER BARBARA B AT [employer] NUMBER [id number], CPT CODE 84696 IS APPROVED FOR SERVICE.   REFERENCE NUMBER [id number]

## 2022-08-16 ENCOUNTER — Other Ambulatory Visit: Payer: Self-pay

## 2022-08-16 ENCOUNTER — Other Ambulatory Visit (HOSPITAL_COMMUNITY): Payer: Self-pay

## 2022-08-16 ENCOUNTER — Telehealth: Payer: Self-pay | Admitting: Podiatry

## 2022-08-16 ENCOUNTER — Other Ambulatory Visit: Payer: Self-pay | Admitting: Podiatry

## 2022-08-16 DIAGNOSIS — M2041 Other hammer toe(s) (acquired), right foot: Secondary | ICD-10-CM | POA: Diagnosis not present

## 2022-08-16 DIAGNOSIS — M2011 Hallux valgus (acquired), right foot: Secondary | ICD-10-CM | POA: Diagnosis not present

## 2022-08-16 DIAGNOSIS — G8918 Other acute postprocedural pain: Secondary | ICD-10-CM | POA: Diagnosis not present

## 2022-08-16 DIAGNOSIS — M21611 Bunion of right foot: Secondary | ICD-10-CM | POA: Diagnosis not present

## 2022-08-16 DIAGNOSIS — M205X1 Other deformities of toe(s) (acquired), right foot: Secondary | ICD-10-CM | POA: Diagnosis not present

## 2022-08-16 MED ORDER — OXYCODONE-ACETAMINOPHEN 5-325 MG PO TABS
1.0000 | ORAL_TABLET | Freq: Four times a day (QID) | ORAL | 0 refills | Status: DC | PRN
  Filled 2022-08-16: qty 25, 3d supply, fill #0

## 2022-08-16 MED ORDER — PROMETHAZINE HCL 25 MG PO TABS
25.0000 mg | ORAL_TABLET | Freq: Three times a day (TID) | ORAL | 0 refills | Status: DC | PRN
  Filled 2022-08-16: qty 20, 7d supply, fill #0

## 2022-08-16 MED ORDER — CEPHALEXIN 500 MG PO CAPS
500.0000 mg | ORAL_CAPSULE | Freq: Three times a day (TID) | ORAL | 2 refills | Status: DC
  Filled 2022-08-16: qty 21, 7d supply, fill #0

## 2022-08-16 NOTE — Telephone Encounter (Signed)
hi this pt called and said that the medication is not at the pharmacy for them to pick up

## 2022-08-21 ENCOUNTER — Ambulatory Visit (INDEPENDENT_AMBULATORY_CARE_PROVIDER_SITE_OTHER): Payer: Medicare PPO | Admitting: Podiatry

## 2022-08-21 ENCOUNTER — Other Ambulatory Visit (HOSPITAL_COMMUNITY): Payer: Self-pay

## 2022-08-21 ENCOUNTER — Encounter: Payer: Self-pay | Admitting: Podiatry

## 2022-08-21 ENCOUNTER — Ambulatory Visit (INDEPENDENT_AMBULATORY_CARE_PROVIDER_SITE_OTHER): Payer: Medicare PPO

## 2022-08-21 DIAGNOSIS — M21619 Bunion of unspecified foot: Secondary | ICD-10-CM

## 2022-08-21 DIAGNOSIS — M21611 Bunion of right foot: Secondary | ICD-10-CM | POA: Diagnosis not present

## 2022-08-21 DIAGNOSIS — Z9889 Other specified postprocedural states: Secondary | ICD-10-CM | POA: Diagnosis not present

## 2022-08-21 MED ORDER — OXYCODONE-ACETAMINOPHEN 5-325 MG PO TABS
1.0000 | ORAL_TABLET | Freq: Four times a day (QID) | ORAL | 0 refills | Status: AC | PRN
Start: 2022-08-21 — End: ?
  Filled 2022-08-21: qty 20, 3d supply, fill #0

## 2022-08-21 MED ORDER — CEPHALEXIN 500 MG PO CAPS
500.0000 mg | ORAL_CAPSULE | Freq: Three times a day (TID) | ORAL | 0 refills | Status: DC
  Filled 2022-08-21 – 2022-08-25 (×3): qty 21, 7d supply, fill #0

## 2022-08-21 NOTE — Progress Notes (Signed)
Subjective: Chief Complaint  Patient presents with   Bunions    Pt came in today for a post op. Bunions right foot.    67 year old male presents the office today for above concerns status post right foot Jeffery Wood anatomy with first IPJ fusion date of surgery August 16, 2022.  He is still having pain requiring pain medication.  Does not report any fevers or chills.  Cam boot is heavy.  Objective: AAO x3, NAD DP/PT pulses palpable bilaterally, CRT less than 3 seconds Right foot: Incision well coapted with sutures intact.  There is mild to moderate swelling there is ecchymosis noted with some mild erythema around the incision without any ascending cellulitis.  There is no fluctuation or crepitation, malodor.  There is no purulence.  Tenderness palpation to the surgical site.  Toes rectus. No pain with calf compression, swelling, warmth, erythema  Assessment: Status post right foot surgery  Plan: -All treatment options discussed with the patient including all alternatives, risks, complications.  -X-rays obtained reviewed.  3 views of the foot were obtained.  Hardware intact for any complicated fractures. -Small amount of antibiotic ointment was applied followed by dressing.  He can keep the dressing clean, dry, intact -Remain in cam boot.  Dispensed a surgical shoe for nighttime for immobilization to help facilitate healing. -Given erythema will continue Keflex. -Pain medication as needed -Ice, elevation -Patient encouraged to call the office with any questions, concerns, change in symptoms.   Jeffery Wood DPM

## 2022-08-22 ENCOUNTER — Other Ambulatory Visit: Payer: Self-pay

## 2022-08-22 ENCOUNTER — Other Ambulatory Visit (HOSPITAL_COMMUNITY): Payer: Self-pay

## 2022-08-25 ENCOUNTER — Other Ambulatory Visit: Payer: Self-pay

## 2022-08-25 ENCOUNTER — Other Ambulatory Visit (HOSPITAL_COMMUNITY): Payer: Self-pay

## 2022-08-25 ENCOUNTER — Other Ambulatory Visit: Payer: Self-pay | Admitting: Family Medicine

## 2022-08-25 MED ORDER — DONEPEZIL HCL 5 MG PO TABS
5.0000 mg | ORAL_TABLET | Freq: Every day | ORAL | 1 refills | Status: DC
  Filled 2022-08-25 (×2): qty 30, 30d supply, fill #0
  Filled 2022-09-20: qty 30, 30d supply, fill #1

## 2022-08-26 NOTE — Progress Notes (Signed)
Subjective:    Patient ID: Jeffery Wood, male    DOB: Jun 21, 1955, 67 y.o.   MRN: [id number]  [07/22/13- Dr Shelle Iron- From review ol notes: The patient is a 67 year old male who I've been asked to see for management of obstructive sleep apnea.  He was apparently diagnosed 10 years ago, and was tried on CPAP with very poor tolerance. He tells me he was tried on all different types of machines, pressure settings, and different masks without success. He could simply not keep the mask on his face. This is gone untreated over the years, and he recently underwent a home sleep test in March of this year which showed moderate OSA. He had an AHI of 24 events per hour with desaturation as low as 85%. The patient states that he is still having loud snoring, and bed partner comments on witnessed apneas.  He has frequent awakenings at night, and is not rested in the mornings upon arising. He has significant daytime sleepiness with any inactivity, and will fall asleep in the evenings watching television or movies. His Epworth score today is 22, and the patient tells that he has lost 30 pounds over the last 2 years.] PFT 11/23/21- Severe obstruction with signif response to BD, Nl DLCO ------------------------------------------   02/28/22- 67 yoM former smoker( 60 pk yrs) followed for COPD, complicated by HTN, DM, CAD, OSA/ failed CPAP, Insomnia, GERD, Neuromuscular Disorder, Psoriasis, Lumbar Spondylolisthesis, BPH, Non-Hodkins Lymphoma, Gout,  -Neb albuterol, Xopenex hfa, Spiriva handihaler, sampled Trelegy 100 -------Pt states he has been doing alright  Covid vax-2 J&J Flu vax-had He had like to Spiriva but found Trelegy sample worked much better.  He has gotten through the winter so far with no significant infection or acute issues. Discussed his cardiac stents.                                                                 PFT 11/23/21- Severe obstruction with signif response to BD, Nl DLCO Covid vax- 2  J&J Flu vax   had   CXR 12/29/21- IMPRESSION: 1. No acute abnormality. 2. Mildly progressive changes of COPD and chronic bronchitis.                                                         //status of OSA//?  08/29/22- 67 yoM former smoker( 60 pk yrs) followed for COPD, complicated by HTN, DM, CAD, OSA/ failed CPAP, Insomnia, GERD, Neuromuscular Disorder, Psoriasis, Lumbar Spondylolisthesis, BPH, Non-Hodkins Lymphoma, Gout,  -Neb albuterol, Xopenex hfa,  Pharmacy told him Trelegy affected memory, switched to Advair 100 We discussed Advair. Advised him to ask his PCP for Neurology referral to discuss memory concerns.   ROS-see HPI   + = positive Constitutional:    weight loss, night sweats, fevers, chills, fatigue, lassitude. HEENT:    headaches, difficulty swallowing, tooth/dental problems, sore throat,       sneezing, itching, ear ache, nasal congestion, post nasal drip, snoring CV:    chest pain, orthopnea, PND, swelling in lower extremities, anasarca,  dizziness, palpitations Resp:   +shortness of breath with exertion or at rest.                productive cough,   +non-productive cough, coughing up of blood.              change in color of mucus.  wheezing.   Skin:    rash or lesions. GI:  No-   heartburn, indigestion, abdominal pain, nausea, vomiting, diarrhea,                 change in bowel habits, loss of appetite GU: dysuria, change in color of urine, no urgency or frequency.   flank pain. MS:   joint pain, stiffness, decreased range of motion, back pain. Neuro-     nothing unusual Psych:  change in mood or affect.  depression or anxiety.   memory loss.  OBJ- Physical Exam General- Alert, Oriented, Affect-appropriate, Distress- none acute, +full beard Skin- rash-none, lesions- none, excoriation- none Lymphadenopathy- none Head- atraumatic            Eyes- Gross vision intact, PERRLA, conjunctivae and secretions clear            Ears-  Hearing, canals-normal            Nose- Clear, no-Septal dev, mucus, polyps, erosion, perforation             Throat- Mallampati II , mucosa clear , drainage- none, tonsils- atrophic Neck- flexible , trachea midline, no stridor , thyroid nl, carotid no bruit Chest - symmetrical excursion , unlabored           Heart/CV- RRR , no murmur , no gallop  , no rub, nl s1 s2                           - JVD- none , edema- none, stasis changes- none, varices- none           Lung- +diminished, wheeze- none, cough- none , dullness-none, rub- none           Chest wall-  Abd-  Br/ Gen/ Rectal- Not done, not indicated Extrem- cyanosis- none, clubbing, none, atrophy- none, strength- nl Neuro- grossly intact to observation     Assessment & Plan:

## 2022-08-27 ENCOUNTER — Other Ambulatory Visit (HOSPITAL_COMMUNITY): Payer: Self-pay

## 2022-08-29 ENCOUNTER — Other Ambulatory Visit: Payer: Self-pay

## 2022-08-29 ENCOUNTER — Ambulatory Visit: Payer: Medicare PPO | Admitting: Internal Medicine

## 2022-08-29 ENCOUNTER — Encounter: Payer: Self-pay | Admitting: Internal Medicine

## 2022-08-29 VITALS — BP 118/68 | HR 85 | Ht 75.0 in | Wt 250.6 lb

## 2022-08-29 DIAGNOSIS — R413 Other amnesia: Secondary | ICD-10-CM

## 2022-08-29 DIAGNOSIS — J449 Chronic obstructive pulmonary disease, unspecified: Secondary | ICD-10-CM | POA: Diagnosis not present

## 2022-08-29 MED ORDER — FLUTICASONE-SALMETEROL 100-50 MCG/ACT IN AEPB
1.0000 | INHALATION_SPRAY | Freq: Two times a day (BID) | RESPIRATORY_TRACT | 12 refills | Status: DC
  Filled 2022-08-29: qty 60, 30d supply, fill #0
  Filled 2022-09-24: qty 60, 30d supply, fill #1

## 2022-08-29 NOTE — Patient Instructions (Signed)
Script sent refilling Advair 100  Ask your primary doctor about seeing a Neurologist for memory concerns

## 2022-08-31 ENCOUNTER — Ambulatory Visit (INDEPENDENT_AMBULATORY_CARE_PROVIDER_SITE_OTHER): Payer: Medicare PPO

## 2022-08-31 ENCOUNTER — Ambulatory Visit (INDEPENDENT_AMBULATORY_CARE_PROVIDER_SITE_OTHER): Payer: Medicare PPO | Admitting: Podiatry

## 2022-08-31 DIAGNOSIS — M21611 Bunion of right foot: Secondary | ICD-10-CM

## 2022-08-31 DIAGNOSIS — M2031 Hallux varus (acquired), right foot: Secondary | ICD-10-CM

## 2022-08-31 DIAGNOSIS — M21619 Bunion of unspecified foot: Secondary | ICD-10-CM

## 2022-08-31 NOTE — Progress Notes (Signed)
Subjective: Chief Complaint  Patient presents with   Routine Post Op    R foot DOS 08/16/22    67 year old male presents the office today for above concerns status post right foot [place] anatomy with first IPJ fusion date of surgery August 16, 2022.  States he is doing better.  He did try to work with your short-term however he was on the lawnmower and the vibration hurt his foot.  He was also helping a neighbor with a tire although he was not physically helping he was standing and it was sore.  Taking with his big toe and now the swelling is down.  No fevers or chills  Objective: AAO x3, NAD DP/PT pulses palpable bilaterally, CRT less than 3 seconds Right foot: Incision well coapted with sutures intact.  There is still some edema present left first and BJ on the surgical site however appears to be improved.  There is no significant cellulitis.  There is no drainage or pus.  There is some maceration along the incision but there is no dehiscence evident.  No significant pain on exam.  Toe is rectus.  No pain with MPJ range of motion. No pain with calf compression, swelling, warmth, erythema  Assessment: Status post right foot surgery  Plan: -All treatment options discussed with the patient including all alternatives, risks, complications.  -X-rays obtained reviewed.  3 views of the foot were obtained.  Hardware intact for any complicated fractures. -Small amount of betadine was applied to the incision site followed by dry sterile dressing.  Discussed he can change the bandage with a similar dressing but do not to the foot wet for now. -Pain medication as needed -Ice, elevation -Patient encouraged to call the office with any questions, concerns, change in symptoms.   Return in about 2 weeks (around 09/14/2022) for post-op, x-ray.  [name] DPM

## 2022-09-04 ENCOUNTER — Other Ambulatory Visit: Payer: Self-pay | Admitting: Family Medicine

## 2022-09-04 ENCOUNTER — Other Ambulatory Visit (HOSPITAL_COMMUNITY): Payer: Self-pay

## 2022-09-04 ENCOUNTER — Other Ambulatory Visit: Payer: Self-pay

## 2022-09-04 MED ORDER — METFORMIN HCL 1000 MG PO TABS
1000.0000 mg | ORAL_TABLET | Freq: Two times a day (BID) | ORAL | 0 refills | Status: DC
  Filled 2022-09-04: qty 180, 90d supply, fill #0

## 2022-09-10 NOTE — Progress Notes (Unsigned)
Cardiology Office Note:    Date:  09/13/2022   ID:  Jeffery Wood, DOB [birth year], MRN [id number]  PCP:  Ardith Dark, MD   [facility] Providers Cardiologist:  Dietrich Pates, MD     Follow up of CAD      History of Present Illness:    Jeffery Wood is a 67 y.o. male with a hx of CAD s/p PCI/DES to mid LAD, distal LAD, and first diagonal ostium, diabetes, large cell lymphoma (in remission), COPD I saw the pt in May 2023 when he was sent for Ca score of 294 74th percentile for age/sex).  He complained of intermittent chest discomfort with and without activity, worse with activity.  Also complaine of "just giving out". Reported that he thought he had a cath many years ago but no specific details given. He underewent LHC on 06/10/21 revealed 95% distal LAD lesion treated with DES, 80% mid LAD lesion treated with DES, 90% D1 lesion treated with balloon angioplasty reducing the blockage down to 40%, distal RPDA occlusion with left-to-right collaterals, EF 55 to 65% by LV gram. DAPT with aspirin and clopidogrel x6 months, plan for clopidogrel monotherapy due to diffuse disease noted in the RCA after 6 months.  He was discharged home on 06/11/2021.  The pt says for the first week after the procedure he felt good, more energy   Then he had discomfort (mild)  Not associated with exertion   when he saw Benjamine Sprague in clinic in June 2023 he did not complain of DOE   He did complain of worsening fatigue      I saw the pt in clinic in Sept 2023    Myoview in Jan 2024 showed nomrla perfusion.  He was seen by [facility] since then, last in Feb 2024  Patient says taht his memory has been a problem   Stopped Trilogy   Seen by C Young  Started on Advair  Not as good  Breathing is OK  Started Primal mind fields   Helps energy     Also on Aricept 5 mg   No CP   Occasional "discomfort " in chest   this am when getting ready   Occurs every now and then   No change in frequency     Past Medical History:   Diagnosis Date   Arthritis    Asthma    Basal cell carcinoma (BCC) of right temple region    CAD (coronary artery disease) 2023   5v   Cancer (HCC)    basal cell of right temple; carcinoids   Complication of anesthesia    hard to wake up after bronchoscopy   Diabetes (HCC)    type 2   Dyspnea    GERD (gastroesophageal reflux disease)    Gout    resolved, no current problem   Headache    History of hiatal hernia 05/16/2006   small noted on EGD   Hypertension    hx   Lung mass    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, in remission-chest, but all over   Memory loss    mild - r/t chemo, 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   Neuromuscular disorder (HCC)    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   Sleep apnea    does not use anything for OSA    Past Surgical History:  Procedure Laterality Date   BACK SURGERY     L4-L5 bracket  and screws   BICEPS TENDON REPAIR Left    CARPAL TUNNEL RELEASE     COLONOSCOPY     CORONARY BALLOON ANGIOPLASTY N/A 06/10/2021   Procedure: CORONARY BALLOON ANGIOPLASTY;  Surgeon: Corky Crafts, MD;  Location: [facility] INVASIVE CV LAB;  Service: Cardiovascular;  Laterality: N/A;   CORONARY STENT INTERVENTION N/A 06/10/2021   Procedure: CORONARY STENT INTERVENTION;  Surgeon: Corky Crafts, MD;  Location: [facility] INVASIVE CV LAB;  Service: Cardiovascular;  Laterality: N/A;   CORONARY ULTRASOUND/IVUS N/A 06/10/2021   Procedure: Intravascular Ultrasound/IVUS;  Surgeon: Corky Crafts, MD;  Location: [facility] INVASIVE CV LAB;  Service: Cardiovascular;  Laterality: N/A;   FINGER SURGERY     KNEE ARTHROSCOPY     ACL tear   LEFT HEART CATH AND CORONARY ANGIOGRAPHY N/A 06/10/2021   Procedure: LEFT HEART CATH AND CORONARY ANGIOGRAPHY;  Surgeon: Corky Crafts, MD;  Location: [facility] INVASIVE CV LAB;  Service: Cardiovascular;  Laterality: N/A;   LEFT HEART CATHETERIZATION WITH CORONARY ANGIOGRAM N/A 12/07/2010   Procedure: LEFT HEART CATHETERIZATION  WITH CORONARY ANGIOGRAM;  Surgeon: Marykay Lex, MD;  Location: [facility] CATH LAB;  Service: Cardiovascular;  Laterality: N/A;   LUMBAR LAMINECTOMY/DECOMPRESSION MICRODISCECTOMY Right 12/09/2020   Procedure: MICRODISCECTOMY, RIGHT LUMBAR TWO-THREE, LUMBAR THREE-FOUR;  Surgeon: Tressie Stalker, MD;  Location: [facility] OR;  Service: Neurosurgery;  Laterality: Right;   PILONIDAL CYST EXCISION     PORT-A-CATH REMOVAL N/A 11/09/2017   Procedure: REMOVAL PORT-A-CATH;  Surgeon: Luretha Murphy, MD;  Location: [facility];  Service: General;  Laterality: N/A;  local   PORTACATH PLACEMENT Left 02/27/2017   Procedure: INSERTION PORT-A-CATH;  Surgeon: Luretha Murphy, MD;  Location: [facility] ORS;  Service: General;  Laterality: Left;   UPPER GASTROINTESTINAL ENDOSCOPY  05/16/2006   VIDEO BRONCHOSCOPY WITH ENDOBRONCHIAL ULTRASOUND N/A 01/24/2017   Procedure: VIDEO BRONCHOSCOPY WITH ENDOBRONCHIAL ULTRASOUND with TRANSBRONCHIAL BIOPSY;  Surgeon: Delight Ovens, MD;  Location: [facility] OR;  Service: Thoracic;  Laterality: N/A;   VIDEO MEDIASTINOSCOPY N/A 02/05/2017   Procedure: VIDEO MEDIASTINOSCOPY;  Surgeon: Delight Ovens, MD;  Location: [facility] OR;  Service: Thoracic;  Laterality: N/A;    Current Medications: Current Meds  Medication Sig   albuterol (PROVENTIL) (2.5 MG/3ML) 0.083% nebulizer solution Take 2.5 mg by nebulization every 4 (four) hours as needed for wheezing or shortness of breath.   ALBUTEROL SULFATE HFA IN Inhale into the lungs. AS DIRECTED PRN   aspirin EC 81 MG tablet Take 81 mg by mouth daily.   atorvastatin (LIPITOR) 40 MG tablet Take 1 tablet (40 mg total) by mouth daily.   augmented betamethasone dipropionate (DIPROLENE-AF) 0.05 % cream Apply 1 Application topically to affected areas on skin as directed.   azelastine (ASTELIN) 0.1 % nasal spray Place 2 sprays into both nostrils 2 (two) times daily.   benzonatate (TESSALON) 200 MG capsule Take 1 capsule by mouth 2 times daily as needed  for cough.   celecoxib (CELEBREX) 200 MG capsule Take 1 capsule (200 mg total) by mouth 2 (two) times daily as needed.   cephALEXin (KEFLEX) 500 MG capsule Take 1 capsule (500 mg) by mouth 3 times daily.   ciclopirox (PENLAC) 8 % solution Apply topically at bedtime. Apply over nail and surrounding skin. Apply daily over previous coat. After seven (7) days, may remove with alcohol and continue cycle.   clopidogrel (PLAVIX) 75 MG tablet Take 1 tablet (75 mg total) by mouth daily with breakfast.   cyclobenzaprine (FLEXERIL) 10 MG tablet Take 1  tablet (10 mg total) by mouth 3 (three) times daily as needed for muscle spasms.   docusate sodium (COLACE) 100 MG capsule Take 1 capsule (100 mg total) by mouth 2 (two) times daily.   donepezil (ARICEPT) 5 MG tablet Take 1 tablet (5 mg) by mouth daily.   DULoxetine (CYMBALTA) 30 MG capsule Take 1 capsule (30 mg total) by mouth at bedtime.   fluticasone-salmeterol (ADVAIR) 100-50 MCG/ACT AEPB Inhale 1 puff into the lungs 2 (two) times daily.   gabapentin (NEURONTIN) 300 MG capsule Take 3 capsules (900 mg total) by mouth 3 (three) times daily. (Patient taking differently: Take 900 mg by mouth in the morning, at noon, in the evening, and at bedtime.)   glimepiride (AMARYL) 4 MG tablet Take 1/2 tablet by mouth before biggest meal of the day.   isosorbide mononitrate (IMDUR) 30 MG 24 hr tablet Take 1/2 tablet (15 mg total) by mouth daily.   levalbuterol (XOPENEX HFA) 45 MCG/ACT inhaler Inhale 2 puffs into the lungs daily as needed for wheezing or shortness of breath.   loratadine (CLARITIN) 10 MG tablet Take 10 mg by mouth daily.   metFORMIN (GLUCOPHAGE) 1000 MG tablet Take 1 tablet (1,000 mg total) by mouth 2 (two) times daily.   metoprolol succinate (TOPROL XL) 25 MG 24 hr tablet Take 1 tablet (25 mg total) by mouth daily.   Multiple Vitamin (MULTIVITAMIN) capsule Take 1 capsule by mouth daily.   nitroGLYCERIN (NITROSTAT) 0.4 MG SL tablet Place 1 tablet (0.4 mg  total) under the tongue every 5 (five) minutes as needed for chest pain.   OVER THE COUNTER MEDICATION Take 1 tablet by mouth daily. Primal Mind Fuel   oxyCODONE-acetaminophen (PERCOCET/ROXICET) 5-325 MG tablet Take 1 - 2 tablets by mouth every 6 hours as needed for severe pain.   pantoprazole (PROTONIX) 40 MG tablet Take 1 tablet (40 mg total) by mouth daily.   promethazine (PHENERGAN) 25 MG tablet Take 1 tablet (25 mg total) by mouth every 8 (eight) hours as needed for nausea or vomiting.   tamsulosin (FLOMAX) 0.4 MG CAPS capsule Take 1 capsule (0.4 mg) by mouth daily.   Tiotropium Bromide Monohydrate (SPIRIVA HANDIHALER IN) Inhale into the lungs. AS DIRECTED ONCE A DAY   traMADol (ULTRAM) 50 MG tablet Take 1 tablet (50 mg total) by mouth every 8 (eight) hours as needed.   traZODone (DESYREL) 50 MG tablet Take 1/2 - 1 tablet (25 - 50 mg) by mouth at bedtime as needed for sleep.     Allergies:   Patient has no known allergies.   Social History   Socioeconomic History   Marital status: Married    Spouse name: Not on file   Number of children: 2   Years of education: Not on file   Highest education level: Some college, no degree  Occupational History   Occupation: retired  Tobacco Use   Smoking status: Former    Current packs/day: 0.00    Average packs/day: 3.0 packs/day for 20.0 years (60.0 ttl pk-yrs)    Types: Cigarettes    Start date: 01/31/1968    Quit date: 01/31/1988    Years since quitting: 34.6   Smokeless tobacco: Never  Vaping Use   Vaping status: Never Used  Substance and Sexual Activity   Alcohol use: No   Drug use: No   Sexual activity: Yes    Partners: Female    Birth control/protection: None  Other Topics Concern   Not on file  Social History  Narrative   1 grand and 1 due 08/2021   Social Determinants of Health   Financial Resource Strain: Low Risk  (05/11/2022)   Overall Financial Resource Strain (CARDIA)    Difficulty of Paying Living Expenses: Not hard at  all  Food Insecurity: No Food Insecurity (05/11/2022)   Hunger Vital Sign    Worried About Running Out of Food in the Last Year: Never true    Ran Out of Food in the Last Year: Never true  Transportation Needs: No Transportation Needs (05/11/2022)   PRAPARE - Administrator, Civil Service (Medical): No    Lack of Transportation (Non-Medical): No  Physical Activity: Inactive (01/03/2022)   Exercise Vital Sign    Days of Exercise per Week: 0 days    Minutes of Exercise per Session: 0 min  Stress: Patient Declined (05/11/2022)   [employer] of Occupational Health - Occupational Stress Questionnaire    Feeling of Stress : Patient declined  Social Connections: Unknown (05/11/2022)   Social Connection and Isolation Panel [NHANES]    Frequency of Communication with Friends and Family: More than three times a week    Frequency of Social Gatherings with Friends and Family: Patient declined    Attends Religious Services: Patient declined    Database administrator or Organizations: Patient declined    Attends Banker Meetings: Never    Marital Status: Married     Family History: The patient's family history includes Asthma in his mother; Emphysema in his mother; Heart disease in his father.  ROS:   Please see the history of present illness.    + fatigue + chest pain All other systems reviewed and are negative.  Labs/Other Studies Reviewed:    The following studies were reviewed today:  LHC 06/10/21    Dist LAD lesion is 95% stenosed.  A drug-eluting stent was successfully placed using a STENT ONYX FRONTIER 2.0X30, postdilated to 2.25 mm.   Post intervention, there is a 0% residual stenosis.   Mid LAD lesion is 80% stenosed.  A drug-eluting stent was successfully placed using a STENT ONYX FRONTIER 3.5X15, postdilated to 3.75 mm and optimized with intravascular ultrasound.   Post intervention, there is a 0% residual stenosis.   1st Diag lesion is 90% stenosed.   Balloon angioplasty was performed using a BALLN SAPPHIRE 2.0X10.   Post intervention, there is a 40% residual stenosis.   distal RPDA lesion is 100% stenosed.  Left to right collaterals.   The left ventricular systolic function is normal.   LV end diastolic pressure is normal.   The left ventricular ejection fraction is 55-65% by visual estimate.   There is no aortic valve stenosis.   High bifurcation of right radial from brachial artery.  Very small radial which will not accept a 6 French sheath.  4 French catheters did fit but intervention was done from the right femoral artery.   In the future, if cath is considered, could try right ulnar artery if radial remains patent.   Successful PCI of the mid LAD, distal LAD and first diagonal ostium.  Continue dual antiplatelet therapy for at least 6 months.  After 6 months, would consider clopidogrel monotherapy due to diffuse disease noted in the RCA.    CT Cardiac Score 05/19/21  Coronary arteries: Normal origins.   Coronary Calcium Score:   Left main: 64   Left anterior descending artery: 223   Left circumflex artery: 1   Right coronary artery: 6  Total: 294   Percentile: 74th   Pericardium: Normal.   Ascending Aorta: Dilated ascending aorta measuring 43mm   Non-cardiac: See separate report from [facility].   IMPRESSION: 1. Coronary calcium score of 294. This was 74th percentile for age-, race-, and sex-matched controls.   2. Dilated ascending aorta measuring 43mm   3. Dilated main pulmonary artery measuring 30mm  Echo 02/08/2017    - Left ventricle: The cavity size was normal. There was mild    concentric hypertrophy. Systolic function was normal. The    estimated ejection fraction was in the range of 60% to 65%.    Although no diagnostic regional wall motion abnormality was    identified, this possibility cannot be completely excluded on the    basis of this study. Doppler parameters are consistent with     abnormal left ventricular relaxation (grade 1 diastolic    dysfunction).  - Ventricular septum: Septal motion showed &quot;bounce&quot;. These  changes   are consistent with RV-LV interaction.  - Aortic valve: Mildly calcified annulus. Trileaflet; normal    thickness leaflets.   Complications:  Echo was requested to be completed quickly for  chemo and port access.   Recent Labs: 01/06/2022: ALT 13; BUN 15; Creatinine, Ser 0.72; Hemoglobin 14.9; Platelets 240.0; Potassium 4.4; Sodium 138; TSH 0.87  Recent Lipid Panel    Component Value Date/Time   CHOL 110 11/10/2021 0819   CHOL 120 09/06/2021 0728   TRIG 66.0 11/10/2021 0819   HDL 38.70 (L) 11/10/2021 0819   HDL 41 09/06/2021 0728   CHOLHDL 3 11/10/2021 0819   VLDL 13.2 11/10/2021 0819   LDLCALC 58 11/10/2021 0819   LDLCALC 65 09/06/2021 0728   LDLCALC 119 (H) 12/16/2019 0847     Risk Assessment/Calculations:       Physical Exam:    VS:  BP 102/62   Pulse 86   Ht 6\' 3"  (1.905 m)   Wt 251 lb 12.8 oz (114.2 kg)   SpO2 96%   BMI 31.47 kg/m     Wt Readings from Last 3 Encounters:  09/13/22 251 lb 12.8 oz (114.2 kg)  08/29/22 250 lb 9.6 oz (113.7 kg)  05/15/22 253 lb 3.2 oz (114.9 kg)     GEN:  Obese 67 yo in no acute distress HEENT: Normal NECK: No JVD; No carotid bruit CARDIAC: RRR, no murmur  RESPIRATORY:  Clear to auscultation ABDOMEN: Soft, non-tender, non-distended MUSCULOSKELETAL:  No edema;    EKG:  EKG  is not  ordered today.     Assessment and Plan:     CAD s/p DES:   Pt with severe diffuse CAD.  He underwent  PCI/DES to the distal LAD, mid LAD, D1.   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    Now on Aricept and OTC agents     Follow up in 6 months   3  COPD   PFTs ordered      F/U based on review of test results     Medication Adjustments/Labs and Tests Ordered: Current medicines are reviewed at length with the patient today.  Concerns regarding medicines are outlined above.  No orders of the defined types were placed in this encounter.  No orders of the defined types were placed in this encounter.   There are no Patient Instructions on file for this visit.   Signed, Dietrich Pates, MD  09/13/2022 8:24 AM    [facility] =

## 2022-09-11 ENCOUNTER — Other Ambulatory Visit (HOSPITAL_COMMUNITY): Payer: Self-pay

## 2022-09-11 ENCOUNTER — Other Ambulatory Visit: Payer: Self-pay | Admitting: Family Medicine

## 2022-09-11 ENCOUNTER — Other Ambulatory Visit: Payer: Self-pay

## 2022-09-11 MED ORDER — TAMSULOSIN HCL 0.4 MG PO CAPS
0.4000 mg | ORAL_CAPSULE | Freq: Every day | ORAL | 1 refills | Status: DC
  Filled 2022-09-11: qty 90, 90d supply, fill #0
  Filled 2022-12-05: qty 90, 90d supply, fill #1

## 2022-09-11 MED ORDER — TRAZODONE HCL 50 MG PO TABS
25.0000 mg | ORAL_TABLET | Freq: Every evening | ORAL | 3 refills | Status: DC | PRN
  Filled 2022-09-11 – 2022-09-15 (×2): qty 30, 30d supply, fill #0
  Filled 2022-10-16: qty 30, 30d supply, fill #1
  Filled 2022-11-14: qty 30, 30d supply, fill #2
  Filled 2022-12-19: qty 30, 30d supply, fill #3

## 2022-09-12 ENCOUNTER — Other Ambulatory Visit: Payer: Self-pay

## 2022-09-12 ENCOUNTER — Other Ambulatory Visit (HOSPITAL_COMMUNITY): Payer: Self-pay

## 2022-09-12 MED ORDER — TRAMADOL HCL 50 MG PO TABS
ORAL_TABLET | ORAL | 1 refills | Status: DC
  Filled 2022-09-12: qty 50, 17d supply, fill #0
  Filled 2022-11-01: qty 50, 17d supply, fill #1

## 2022-09-13 ENCOUNTER — Encounter: Payer: Self-pay | Admitting: Internal Medicine

## 2022-09-13 ENCOUNTER — Ambulatory Visit: Payer: Medicare PPO | Attending: Internal Medicine | Admitting: Internal Medicine

## 2022-09-13 ENCOUNTER — Other Ambulatory Visit: Payer: Self-pay | Admitting: Internal Medicine

## 2022-09-13 ENCOUNTER — Other Ambulatory Visit (HOSPITAL_COMMUNITY): Payer: Self-pay

## 2022-09-13 VITALS — BP 102/62 | HR 86 | Ht 75.0 in | Wt 251.8 lb

## 2022-09-13 DIAGNOSIS — E785 Hyperlipidemia, unspecified: Secondary | ICD-10-CM | POA: Diagnosis not present

## 2022-09-13 DIAGNOSIS — I2510 Atherosclerotic heart disease of native coronary artery without angina pectoris: Secondary | ICD-10-CM | POA: Diagnosis not present

## 2022-09-13 DIAGNOSIS — I10 Essential (primary) hypertension: Secondary | ICD-10-CM | POA: Diagnosis not present

## 2022-09-13 MED ORDER — NITROGLYCERIN 0.4 MG SL SUBL
0.4000 mg | SUBLINGUAL_TABLET | SUBLINGUAL | 3 refills | Status: DC | PRN
  Filled 2022-09-13: qty 25, 8d supply, fill #0

## 2022-09-13 NOTE — Patient Instructions (Signed)
Medication Instructions:  Continue Plavix 75 mg a day Stop Aspirin  Stop Metoprolol   *If you need a refill on your cardiac medications before your next appointment, please call your pharmacy*   Lab Work: Nmr, cmet, cbc   If you have labs (blood work) drawn today and your tests are completely normal, you will receive your results only by: MyChart Message (if you have MyChart) OR A paper copy in the mail If you have any lab test that is abnormal or we need to change your treatment, we will call you to review the results.   Testing/Procedures:    Follow-Up: At [facility], you and your health needs are our priority.  As part of our continuing mission to provide you with exceptional heart care, we have created designated Provider Care Teams.  These Care Teams include your primary Cardiologist (physician) and Advanced Practice Providers (APPs -  Physician Assistants and Nurse Practitioners) who all work together to provide you with the care you need, when you need it.  We recommend signing up for the patient portal called "MyChart".  Sign up information is provided on this After Visit Summary.  MyChart is used to connect with patients for Virtual Visits (Telemedicine).  Patients are able to view lab/test results, encounter notes, upcoming appointments, etc.  Non-urgent messages can be sent to your provider as well.   To learn more about what you can do with MyChart, go to [email or web address].    Your next appointment:   6 month(s)  Provider:   Dietrich Pates, MD     Other Instructions

## 2022-09-14 ENCOUNTER — Ambulatory Visit (INDEPENDENT_AMBULATORY_CARE_PROVIDER_SITE_OTHER): Payer: Medicare PPO | Admitting: Podiatry

## 2022-09-14 ENCOUNTER — Encounter: Payer: Self-pay | Admitting: Pharmacist

## 2022-09-14 ENCOUNTER — Other Ambulatory Visit (HOSPITAL_COMMUNITY): Payer: Self-pay

## 2022-09-14 ENCOUNTER — Ambulatory Visit: Payer: Medicare PPO

## 2022-09-14 ENCOUNTER — Other Ambulatory Visit: Payer: Self-pay

## 2022-09-14 VITALS — Temp 96.6°F

## 2022-09-14 DIAGNOSIS — M21611 Bunion of right foot: Secondary | ICD-10-CM | POA: Diagnosis not present

## 2022-09-14 DIAGNOSIS — M21619 Bunion of unspecified foot: Secondary | ICD-10-CM

## 2022-09-14 LAB — NMR, LIPOPROFILE
Cholesterol, Total: 114 mg/dL (ref 100–199)
HDL Particle Number: 26.2 umol/L — ABNORMAL LOW (ref >=30.5)
HDL-C: 39 mg/dL — ABNORMAL LOW (ref >39)
LDL Particle Number: 582 nmol/L (ref <1000)
LDL Size: 19.9 nm — ABNORMAL LOW (ref >20.5)
LDL-C (NIH Calc): 45 mg/dL (ref 0–99)
LP-IR Score: 64 — ABNORMAL HIGH (ref <=45)
Small LDL Particle Number: 446 nmol/L (ref <=527)
Triglycerides: 181 mg/dL — ABNORMAL HIGH (ref 0–149)

## 2022-09-14 LAB — NO SPECIMEN RECEIVED

## 2022-09-14 MED ORDER — CEPHALEXIN 500 MG PO CAPS
500.0000 mg | ORAL_CAPSULE | Freq: Four times a day (QID) | ORAL | 0 refills | Status: DC
  Filled 2022-09-14: qty 40, 10d supply, fill #0

## 2022-09-14 NOTE — Progress Notes (Signed)
Subjective: Chief Complaint  Patient presents with   Routine Post Op    POV # 3 DOS 08/16/22 --- RIGHT FOOT SURGICAL CORRECTION OF BUNION ([place]) BIG TOE IPJ FUSION, HAMMERTOE 2-5, SHORTENING OF 2,52 MET    67 year old male presents the office today for above concerns status post right foot [place] anatomy with first IPJ fusion date of surgery August 16, 2022.  States has been on his feet quite a bit recently.  He has been bush hogging as well.  States the swelling is improved.  Does not report any fevers or chills.  No significant pain.   Objective: AAO x3, NAD DP/PT pulses palpable bilaterally, CRT less than 3 seconds Right foot: Incision well coapted and the incision is healing. A scar is forming. There is no evidence of dehiscence.  There is still localized edema on the surgical site there is a mild erythema.  There is no increased temperature with this and there is no drainage or pus or any fluctuation, crepitus.  No pain with calf compression, swelling, warmth, erythema  Assessment: Status post right foot surgery  Plan: -All treatment options discussed with the patient including all alternatives, risks, complications.  -Incision appears to be healing well and still concern about swelling or redness.  Has been on his feet quite a bit and I discussed the importance of staying off his foot, icing and elevating off the yard.  I discussed in fact which could lead to amputation if he is not careful.  I think that the erythema is more from inflammation as opposed to infection but we will start antibiotics.  Prescribed cephalexin. -Pain medication as needed -Ice, elevation -Patient encouraged to call the office with any questions, concerns, change in symptoms.   Return in about 2 weeks (around 09/14/2022) for post-op, x-ray.  [name] DPM

## 2022-09-15 ENCOUNTER — Other Ambulatory Visit (HOSPITAL_COMMUNITY): Payer: Self-pay

## 2022-09-15 ENCOUNTER — Other Ambulatory Visit: Payer: Self-pay

## 2022-09-18 ENCOUNTER — Encounter: Payer: Self-pay | Admitting: Internal Medicine

## 2022-09-18 LAB — CBC
Hematocrit: 42.7 % (ref 37.5–51.0)
Hemoglobin: 14.8 g/dL (ref 13.0–17.7)
MCH: 31.1 pg (ref 26.6–33.0)
MCHC: 34.7 g/dL (ref 31.5–35.7)
MCV: 90 fL (ref 79–97)
Platelets: 242 10*3/uL (ref 150–450)
RBC: 4.76 x10E6/uL (ref 4.14–5.80)
RDW: 12.7 % (ref 11.6–15.4)
WBC: 6.6 10*3/uL (ref 3.4–10.8)

## 2022-09-18 LAB — COMPREHENSIVE METABOLIC PANEL
ALT: 18 IU/L (ref 0–44)
AST: 20 IU/L (ref 0–40)
Albumin: 4.3 g/dL (ref 3.9–4.9)
Alkaline Phosphatase: 106 IU/L (ref 44–121)
BUN/Creatinine Ratio: 14 (ref 10–24)
BUN: 13 mg/dL (ref 8–27)
Bilirubin Total: 0.6 mg/dL (ref 0.0–1.2)
CO2: 24 mmol/L (ref 20–29)
Calcium: 9.7 mg/dL (ref 8.6–10.2)
Chloride: 102 mmol/L (ref 96–106)
Creatinine, Ser: 0.94 mg/dL (ref 0.76–1.27)
Globulin, Total: 2.1 g/dL (ref 1.5–4.5)
Glucose: 149 mg/dL — ABNORMAL HIGH (ref 70–99)
Potassium: 4.7 mmol/L (ref 3.5–5.2)
Sodium: 141 mmol/L (ref 134–144)
Total Protein: 6.4 g/dL (ref 6.0–8.5)
eGFR: 89 mL/min/{1.73_m2} (ref >59)

## 2022-09-18 LAB — NMR, LIPOPROFILE

## 2022-09-18 NOTE — Assessment & Plan Note (Signed)
Im not concerned about his inhalers causing memory loss. Uncontrolled dyspnea would be a greater hazard. Plan- discuss with PCP- possible Neurology referral  to assess memory.

## 2022-09-18 NOTE — Assessment & Plan Note (Signed)
Discussed PFT from last year and available med types. Plan- Advair refilled. Emphasize regular use.

## 2022-09-20 ENCOUNTER — Other Ambulatory Visit: Payer: Self-pay | Admitting: Family Medicine

## 2022-09-21 ENCOUNTER — Other Ambulatory Visit (HOSPITAL_COMMUNITY): Payer: Self-pay

## 2022-09-21 ENCOUNTER — Other Ambulatory Visit: Payer: Self-pay

## 2022-09-21 MED ORDER — GABAPENTIN 300 MG PO CAPS
900.0000 mg | ORAL_CAPSULE | Freq: Three times a day (TID) | ORAL | 3 refills | Status: DC
  Filled 2022-09-21 – 2022-10-11 (×2): qty 810, 90d supply, fill #0

## 2022-09-21 MED ORDER — CELECOXIB 200 MG PO CAPS
200.0000 mg | ORAL_CAPSULE | Freq: Two times a day (BID) | ORAL | 5 refills | Status: DC | PRN
  Filled 2022-09-21: qty 60, 30d supply, fill #0
  Filled 2022-11-23: qty 60, 30d supply, fill #1
  Filled 2022-12-19: qty 60, 30d supply, fill #2
  Filled 2023-01-31: qty 60, 30d supply, fill #3
  Filled 2023-03-01: qty 60, 30d supply, fill #4
  Filled 2023-04-07: qty 60, 30d supply, fill #5

## 2022-09-24 ENCOUNTER — Other Ambulatory Visit (HOSPITAL_COMMUNITY): Payer: Self-pay

## 2022-09-26 ENCOUNTER — Ambulatory Visit (INDEPENDENT_AMBULATORY_CARE_PROVIDER_SITE_OTHER): Payer: Medicare PPO | Admitting: Podiatry

## 2022-09-26 ENCOUNTER — Encounter: Payer: Self-pay | Admitting: Podiatry

## 2022-09-26 ENCOUNTER — Ambulatory Visit (INDEPENDENT_AMBULATORY_CARE_PROVIDER_SITE_OTHER): Payer: Medicare PPO

## 2022-09-26 DIAGNOSIS — M21619 Bunion of unspecified foot: Secondary | ICD-10-CM

## 2022-09-26 DIAGNOSIS — M21611 Bunion of right foot: Secondary | ICD-10-CM

## 2022-09-26 NOTE — Progress Notes (Signed)
Subjective: Chief Complaint  Patient presents with   Routine Post Op    RM13: POV # 3 DOS 08/16/22 --- RIGHT FOOT SURGICAL CORRECTION OF BUNION ([place]) BIG TOE IPJ FUSION, HAMMERTOE 2-5, SHORTENING OF 2,3 MET Patient states foot gets swollen and dark red with walking and movement no severe pain     67 year old male presents the office today for above concerns status post right foot [place] anatomy with first IPJ fusion date of surgery August 16, 2022.  States that he is doing better.  Some antibiotics without any side effects.  He still has a little bit about this he has missed a couple doses.  He is on his feet quite a bit and been on them all day.  The more is on his feet he will get swollen and dark red however the ice and elevate stiffness resolves.  He has not had any significant pain to the foot.  He does not report any fevers or chills.  Denies any drainage or pus in the incision.   Objective: AAO x3, NAD DP/PT pulses palpable bilaterally, CRT less than 3 seconds Right foot: Incision well coapted and the incision is healing.  Scars are forming.  There was report appears to be old scab on the incision.  There is some localized edema and erythema to the incision but there is no ascending cellulitis.  There is no fluctuation, capitation, malodor.  Toe is rectus.  No pain on exam.  There is no increased temperature noted.  No pain with calf compression, swelling, warmth, erythema  Assessment: Status post right foot surgery  Plan: -All treatment options discussed with the patient including all alternatives, risks, complications.  -X-rays obtained reviewed.  3 views of the foot were obtained.  Status post hallux IPJ fusion as well as osteopenia reviewed.  Hardware intact and complicating factors.  Soft tissue edema noted.  No destructive changes suggest osteomyelitis and soft tissue edema. -Continue with surgical shoe.  Continue ice, elevate as well as compression.  He states that compression  makes her swell more.  Rarely needs an Ace bandage to help for now.  Discussed compression sock as well.  Discussed limiting activity states that he has a lot to do has been on his feet.  I do that has a lot to do with the swelling.  The erythema think is more from inflammation as opposed to infection because it does fluctuate.  Will finish up a course of antibiotics and should he have any recurrence or worsening redness to let me know immediately. -Pain medication as needed -Ice, elevation -Patient encouraged to call the office with any questions, concerns, change in symptoms.   Return in about 2 weeks (around 10/10/2022) for post-op, x-ray.  [name] DPM

## 2022-10-08 ENCOUNTER — Other Ambulatory Visit (HOSPITAL_COMMUNITY): Payer: Self-pay

## 2022-10-09 ENCOUNTER — Other Ambulatory Visit: Payer: Self-pay

## 2022-10-09 ENCOUNTER — Other Ambulatory Visit: Payer: Self-pay | Admitting: Podiatry

## 2022-10-09 ENCOUNTER — Telehealth: Payer: Self-pay | Admitting: Podiatry

## 2022-10-09 ENCOUNTER — Other Ambulatory Visit (HOSPITAL_COMMUNITY): Payer: Self-pay

## 2022-10-09 MED ORDER — CEPHALEXIN 500 MG PO CAPS
500.0000 mg | ORAL_CAPSULE | Freq: Four times a day (QID) | ORAL | 0 refills | Status: DC
  Filled 2022-10-09: qty 40, 10d supply, fill #0

## 2022-10-09 NOTE — Telephone Encounter (Signed)
I received a request refill for the antibiotic.  I called the patient back he states that his foot is infected.  He was wearing flip-flops and he hit his foot and he got it caught and he noticed bleeding. He thinks it is not that bad but his wife states he needs an antibiotic. I have sent this over for him. I am going to have him come in tomorrow at 1:15pm and not wait until Friday. He denies any fevers or chills.   Dawn, can you change this appointment? Pt is aware .

## 2022-10-10 ENCOUNTER — Ambulatory Visit (INDEPENDENT_AMBULATORY_CARE_PROVIDER_SITE_OTHER): Payer: Medicare PPO

## 2022-10-10 ENCOUNTER — Ambulatory Visit (INDEPENDENT_AMBULATORY_CARE_PROVIDER_SITE_OTHER): Payer: Medicare PPO | Admitting: Podiatry

## 2022-10-10 VITALS — Temp 98.1°F

## 2022-10-10 DIAGNOSIS — M21611 Bunion of right foot: Secondary | ICD-10-CM | POA: Diagnosis not present

## 2022-10-10 DIAGNOSIS — M21619 Bunion of unspecified foot: Secondary | ICD-10-CM

## 2022-10-10 NOTE — Progress Notes (Signed)
Subjective: 67 year old male presents the office today for above concerns status post right foot Eliberto Ivory anatomy with first IPJ fusion date of surgery August 16, 2022.  He presents today for an acute appointment.  He states over the weekend from flip-flops in the garage and he cut his foot.  Did not realize and they looked down the sole a lot of blood.  He states that his tetanus is up-to-date.  He is easily on his feet a lot since surgery and even the day after surgery he states that he had to help a neighbor deliver something.  He does not report any fevers or chills.  He started cephalexin yesterday.   Objective: AAO x3, NAD DP/PT pulses palpable bilaterally, CRT less than 3 seconds Right foot: Incision well coapted and the incision is healing.  Scars are forming.  Superficial laceration are starting to scab over the dorsal lateral aspect of the foot and just adjacent to the incision on the distal medial aspect.  There is no drainage or pus noted today.  There is no areas of fluctuation or crepitation there is no odor.  On the first digit there is some localized edema and erythema present on the dorsal aspect does not extend plantarly or medially there is no ascending cellulitis.  There is no increased temperature.  He states that when he ices and elevates the color returns to normal. No pain with calf compression, swelling, warmth, erythema  Assessment: Status post right foot surgery  Plan: -All treatment options discussed with the patient including all alternatives, risks, complications.  -X-rays obtained reviewed.  3 views of the foot were obtained.  Status post hallux IPJ fusion as well as osteopenia reviewed.  Hardware intact and complicating factors.  Edema is present. -1 continue antibiotic ointment dressing changes to the lacerations daily.  No need to suture them today as they are superficial and already starting to scab over.  Continue cephalexin.  Continue surgical shoe and offloading and  limiting activity.  Continue elevation and icing. -Monitor for any clinical signs or symptoms of infection and directed to call the office immediately should any occur or go to the ER.  We again discussed today that if infection were to recur or persist then there if he is at risk of amputation.   No follow-ups on file.  [name] DPM

## 2022-10-11 ENCOUNTER — Telehealth: Payer: Self-pay | Admitting: Family Medicine

## 2022-10-11 ENCOUNTER — Other Ambulatory Visit: Payer: Self-pay | Admitting: Family Medicine

## 2022-10-11 ENCOUNTER — Other Ambulatory Visit (HOSPITAL_COMMUNITY): Payer: Self-pay

## 2022-10-11 NOTE — Telephone Encounter (Signed)
Patient requests a new RX for the following medication with dosage increase to take 4 capsules 3 x per day (Patient states he has been taking 4 capsules 3 x per day for over a year)  Prescription Request  10/11/2022  LOV: 05/15/2022  What is the name of the medication or equipment? gabapentin (NEURONTIN) 300 MG capsule   Have you contacted your pharmacy to request a refill? Yes   Which pharmacy would you like this sent to?  [place] [facility] [address] [place] [address] Phone: [phone] Fax: [phone]    Patient notified that their request is being sent to the clinical staff for review and that they should receive a response within 2 business days.   Please advise at Mobile [phone] (mobile)

## 2022-10-12 ENCOUNTER — Other Ambulatory Visit (HOSPITAL_COMMUNITY): Payer: Self-pay

## 2022-10-12 ENCOUNTER — Other Ambulatory Visit: Payer: Self-pay

## 2022-10-12 MED ORDER — GABAPENTIN 300 MG PO CAPS
1200.0000 mg | ORAL_CAPSULE | Freq: Three times a day (TID) | ORAL | 3 refills | Status: DC
Start: 2022-10-12 — End: 2023-05-16
  Filled 2022-10-12: qty 1080, 90d supply, fill #0
  Filled 2022-10-18: qty 60, 5d supply, fill #0
  Filled 2022-10-18: qty 1080, 90d supply, fill #0
  Filled 2022-10-19 – 2022-10-26 (×2): qty 1080, 90d supply, fill #1
  Filled 2023-01-19: qty 1080, 90d supply, fill #2
  Filled 2023-04-19: qty 360, 30d supply, fill #3
  Filled 2023-05-14 – 2023-05-17 (×2): qty 360, 30d supply, fill #4

## 2022-10-12 NOTE — Telephone Encounter (Signed)
Pharmacy comment: pt says he takes 4 capsules TID. Please verify and send new rx.

## 2022-10-12 NOTE — Telephone Encounter (Signed)
Rx was send to pharmacy  

## 2022-10-13 ENCOUNTER — Ambulatory Visit: Payer: Medicare PPO | Admitting: Podiatry

## 2022-10-17 ENCOUNTER — Other Ambulatory Visit (HOSPITAL_COMMUNITY): Payer: Self-pay

## 2022-10-17 ENCOUNTER — Other Ambulatory Visit: Payer: Self-pay | Admitting: Family Medicine

## 2022-10-17 ENCOUNTER — Other Ambulatory Visit: Payer: Self-pay

## 2022-10-17 MED ORDER — DONEPEZIL HCL 5 MG PO TABS
5.0000 mg | ORAL_TABLET | Freq: Every day | ORAL | 1 refills | Status: DC
  Filled 2022-10-17: qty 30, 30d supply, fill #0
  Filled 2022-11-18: qty 30, 30d supply, fill #1

## 2022-10-18 ENCOUNTER — Encounter: Payer: Self-pay | Admitting: Oncology

## 2022-10-18 ENCOUNTER — Other Ambulatory Visit (HOSPITAL_COMMUNITY): Payer: Self-pay

## 2022-10-18 ENCOUNTER — Other Ambulatory Visit: Payer: Self-pay

## 2022-10-19 ENCOUNTER — Other Ambulatory Visit (HOSPITAL_COMMUNITY): Payer: Self-pay

## 2022-10-19 ENCOUNTER — Other Ambulatory Visit: Payer: Self-pay

## 2022-10-19 ENCOUNTER — Encounter: Payer: Self-pay | Admitting: Oncology

## 2022-10-19 NOTE — Telephone Encounter (Signed)
Patient called in stating pharmacy informed him that insurance informed they the medication will not be covered under the new frequency of 3 pills TID. Patient states he was told by pharmacy that insurance company would have to be talked to to get this approved. I recommended pt call either pharmacy or insurance company to see if they could send over a form that may need to be completed or find out more details.   In the meantime, patient wants to know if PCP could switch medication frequency back to being 3 pills TID so it will be covered by insurance for now.

## 2022-10-23 NOTE — Telephone Encounter (Signed)
Please advise 

## 2022-10-24 NOTE — Telephone Encounter (Signed)
Can we clarify? It was already sent in for three times daily dosing.   Katina Degree. Jimmey Ralph, MD 10/24/2022 7:36 AM

## 2022-10-25 NOTE — Telephone Encounter (Signed)
Patient requests to be called re: status of RX

## 2022-10-26 ENCOUNTER — Other Ambulatory Visit (HOSPITAL_COMMUNITY): Payer: Self-pay

## 2022-10-26 NOTE — Telephone Encounter (Signed)
Approved today by [facility] NCPDP 2017 PA Case: [id number], Status: Approved, Coverage Starts on: 01/30/2022 12:00:00 AM, Coverage Ends on: 01/30/2023 12:00:00 AM. Patient notified

## 2022-10-26 NOTE — Telephone Encounter (Signed)
Spoke with patient, stated pharmacy send PA   (KeyAnnabell Howells) - [id number] Gabapentin 300MG  capsules status: PA Request Sent: September 26th, 2024 Waiting for determination

## 2022-10-27 ENCOUNTER — Ambulatory Visit (INDEPENDENT_AMBULATORY_CARE_PROVIDER_SITE_OTHER): Payer: Medicare PPO

## 2022-10-27 ENCOUNTER — Ambulatory Visit: Payer: Medicare PPO | Admitting: Podiatry

## 2022-10-27 DIAGNOSIS — M1991 Primary osteoarthritis, unspecified site: Secondary | ICD-10-CM | POA: Diagnosis not present

## 2022-10-27 DIAGNOSIS — L409 Psoriasis, unspecified: Secondary | ICD-10-CM | POA: Diagnosis not present

## 2022-10-27 DIAGNOSIS — M5136 Other intervertebral disc degeneration, lumbar region: Secondary | ICD-10-CM | POA: Diagnosis not present

## 2022-10-27 DIAGNOSIS — M1A09X0 Idiopathic chronic gout, multiple sites, without tophus (tophi): Secondary | ICD-10-CM | POA: Diagnosis not present

## 2022-10-27 DIAGNOSIS — M79645 Pain in left finger(s): Secondary | ICD-10-CM | POA: Diagnosis not present

## 2022-10-27 DIAGNOSIS — M79644 Pain in right finger(s): Secondary | ICD-10-CM | POA: Diagnosis not present

## 2022-10-27 DIAGNOSIS — M25511 Pain in right shoulder: Secondary | ICD-10-CM | POA: Diagnosis not present

## 2022-10-27 DIAGNOSIS — M21611 Bunion of right foot: Secondary | ICD-10-CM | POA: Diagnosis not present

## 2022-10-27 NOTE — Progress Notes (Signed)
Subjective: 67 year old male presents the office today for follow up evaluation status post right foot Eliberto Ivory anatomy with first IPJ fusion date of surgery August 16, 2022.  States he is doing better.  He finished course of antibiotics.  No redness or warmth.  Some swelling and occasional tenderness but overall improving he reports.  Does not report any fevers or chills.  The cuts on the foot from prior injury are healing well no drainage or pus he notes.   Objective: AAO x3, NAD DP/PT pulses palpable bilaterally, CRT less than 3 seconds Right foot: Incision well coapted and the incision is healing.  There are still some mild edema present but there is no significant cellulitis.  Small Mehta scabbing still present from the previous laceration from the last appointment they appear to be healing well without any drainage or pus.  There is no areas of fluctuation or crepitation.  There is no malodor.  There is no obvious signs of infection today.  There is good range of motion first MTPJ.  Hammertoe contractures noted of the lesser digits. No pain with calf compression, swelling, warmth, erythema    Assessment: Status post right foot surgery  Plan: -All treatment options discussed with the patient including all alternatives, risks, complications.  -X-rays obtained reviewed.  3 views of the foot were obtained.  Status post hallux IPJ fusion as well as osteopenia reviewed. There is mild elevation on the lateral view which has been noted previously. -Overall he is doing much better.  Discussed range of motion exercises.  Continue supportive shoe gear.  He likes to wear flip-flops a lot discussed more support will be more beneficial for him.  Dispensed toe crest to help with the hammertoes and pressure mostly on the lesser digits. -Discussed range of motion exercises for the hammertoes. -Continue ice, elevation as well as compression of any residual edema. -Monitor for any clinical signs or symptoms of  infection and directed to call the office immediately should any occur or go to the ER.  Return in about 6 weeks (around 12/08/2022), or if symptoms worsen or fail to improve, for post-op, x-ray.  [name] DPM

## 2022-10-31 ENCOUNTER — Emergency Department (HOSPITAL_COMMUNITY)
Admission: EM | Admit: 2022-10-31 | Discharge: 2022-10-31 | Disposition: A | Discharge status: Home / Self Care | Payer: Medicare PPO | Attending: Emergency Medicine | Admitting: Emergency Medicine

## 2022-10-31 ENCOUNTER — Other Ambulatory Visit: Payer: Self-pay

## 2022-10-31 ENCOUNTER — Emergency Department (HOSPITAL_COMMUNITY): Payer: Medicare PPO

## 2022-10-31 DIAGNOSIS — R112 Nausea with vomiting, unspecified: Secondary | ICD-10-CM | POA: Diagnosis not present

## 2022-10-31 DIAGNOSIS — I10 Essential (primary) hypertension: Secondary | ICD-10-CM | POA: Diagnosis not present

## 2022-10-31 DIAGNOSIS — R231 Pallor: Secondary | ICD-10-CM | POA: Diagnosis not present

## 2022-10-31 DIAGNOSIS — R110 Nausea: Secondary | ICD-10-CM | POA: Diagnosis not present

## 2022-10-31 DIAGNOSIS — Z85828 Personal history of other malignant neoplasm of skin: Secondary | ICD-10-CM | POA: Insufficient documentation

## 2022-10-31 DIAGNOSIS — Z7984 Long term (current) use of oral hypoglycemic drugs: Secondary | ICD-10-CM | POA: Insufficient documentation

## 2022-10-31 DIAGNOSIS — D72829 Elevated white blood cell count, unspecified: Secondary | ICD-10-CM | POA: Insufficient documentation

## 2022-10-31 DIAGNOSIS — Z7902 Long term (current) use of antithrombotics/antiplatelets: Secondary | ICD-10-CM | POA: Insufficient documentation

## 2022-10-31 DIAGNOSIS — E119 Type 2 diabetes mellitus without complications: Secondary | ICD-10-CM | POA: Diagnosis not present

## 2022-10-31 DIAGNOSIS — G4489 Other headache syndrome: Secondary | ICD-10-CM | POA: Diagnosis not present

## 2022-10-31 DIAGNOSIS — R0602 Shortness of breath: Secondary | ICD-10-CM | POA: Diagnosis not present

## 2022-10-31 DIAGNOSIS — R29818 Other symptoms and signs involving the nervous system: Secondary | ICD-10-CM | POA: Diagnosis not present

## 2022-10-31 DIAGNOSIS — R079 Chest pain, unspecified: Secondary | ICD-10-CM | POA: Diagnosis not present

## 2022-10-31 DIAGNOSIS — R9089 Other abnormal findings on diagnostic imaging of central nervous system: Secondary | ICD-10-CM | POA: Diagnosis not present

## 2022-10-31 DIAGNOSIS — Z8572 Personal history of non-Hodgkin lymphomas: Secondary | ICD-10-CM | POA: Diagnosis not present

## 2022-10-31 DIAGNOSIS — J449 Chronic obstructive pulmonary disease, unspecified: Secondary | ICD-10-CM | POA: Insufficient documentation

## 2022-10-31 DIAGNOSIS — J9811 Atelectasis: Secondary | ICD-10-CM | POA: Diagnosis not present

## 2022-10-31 DIAGNOSIS — I2510 Atherosclerotic heart disease of native coronary artery without angina pectoris: Secondary | ICD-10-CM | POA: Insufficient documentation

## 2022-10-31 DIAGNOSIS — Z79899 Other long term (current) drug therapy: Secondary | ICD-10-CM | POA: Diagnosis not present

## 2022-10-31 DIAGNOSIS — I6782 Cerebral ischemia: Secondary | ICD-10-CM | POA: Diagnosis not present

## 2022-10-31 DIAGNOSIS — R42 Dizziness and giddiness: Secondary | ICD-10-CM | POA: Diagnosis not present

## 2022-10-31 LAB — CBC
HCT: 44.9 % (ref 39.0–52.0)
Hemoglobin: 15.1 g/dL (ref 13.0–17.0)
MCH: 29.7 pg (ref 26.0–34.0)
MCHC: 33.6 g/dL (ref 30.0–36.0)
MCV: 88.4 fL (ref 80.0–100.0)
Platelets: 277 10*3/uL (ref 150–400)
RBC: 5.08 MIL/uL (ref 4.22–5.81)
RDW: 13.4 % (ref 11.5–15.5)
WBC: 11.4 10*3/uL — ABNORMAL HIGH (ref 4.0–10.5)
nRBC: 0 % (ref 0.0–0.2)

## 2022-10-31 LAB — BASIC METABOLIC PANEL
Anion gap: 12 (ref 5–15)
BUN: 12 mg/dL (ref 8–23)
CO2: 23 mmol/L (ref 22–32)
Calcium: 9.3 mg/dL (ref 8.9–10.3)
Chloride: 102 mmol/L (ref 98–111)
Creatinine, Ser: 0.72 mg/dL (ref 0.61–1.24)
GFR, Estimated: >60 mL/min (ref >60)
Glucose, Bld: 148 mg/dL — ABNORMAL HIGH (ref 70–99)
Potassium: 4 mmol/L (ref 3.5–5.1)
Sodium: 137 mmol/L (ref 135–145)

## 2022-10-31 LAB — TROPONIN I (HIGH SENSITIVITY)
Troponin I (High Sensitivity): 3 ng/L (ref <18)
Troponin I (High Sensitivity): 4 ng/L (ref <18)

## 2022-10-31 MED ORDER — DIPHENHYDRAMINE HCL 50 MG/ML IJ SOLN
25.0000 mg | Freq: Once | INTRAMUSCULAR | Status: AC
  Administered 2022-10-31: 25 mg via INTRAVENOUS
  Filled 2022-10-31: qty 1

## 2022-10-31 MED ORDER — SODIUM CHLORIDE 0.9 % IV BOLUS
500.0000 mL | Freq: Once | INTRAVENOUS | Status: AC
  Administered 2022-10-31: 500 mL via INTRAVENOUS

## 2022-10-31 MED ORDER — DIAZEPAM 5 MG PO TABS: 5.0000 mg | ORAL_TABLET | Freq: Two times a day (BID) | ORAL | 0 refills | Status: DC | PRN

## 2022-10-31 MED ORDER — METOCLOPRAMIDE HCL 10 MG PO TABS: 10.0000 mg | ORAL_TABLET | Freq: Four times a day (QID) | ORAL | 0 refills | Status: DC | PRN

## 2022-10-31 MED ORDER — MECLIZINE HCL 25 MG PO TABS
25.0000 mg | ORAL_TABLET | Freq: Once | ORAL | Status: AC
  Administered 2022-10-31: 25 mg via ORAL
  Filled 2022-10-31: qty 1

## 2022-10-31 MED ORDER — METOCLOPRAMIDE HCL 5 MG/ML IJ SOLN
10.0000 mg | Freq: Once | INTRAMUSCULAR | Status: AC
  Administered 2022-10-31: 10 mg via INTRAVENOUS
  Filled 2022-10-31: qty 2

## 2022-10-31 MED ORDER — ONDANSETRON HCL 4 MG/2ML IJ SOLN
4.0000 mg | Freq: Once | INTRAMUSCULAR | Status: AC
  Administered 2022-10-31: 4 mg via INTRAVENOUS
  Filled 2022-10-31: qty 2

## 2022-10-31 NOTE — Discharge Instructions (Addendum)
As discussed, suspect your symptoms are likely secondary to peripheral cause of vertigo.  Recommend continued use of medication as needed for dizziness.  Will send in referral for ENT specialist in outpatient setting for reassessment.  Please do not hesitate to return to emergency department for worrisome signs and symptoms we discussed become apparent.

## 2022-10-31 NOTE — ED Triage Notes (Signed)
Pt began having central non radiating chest pain this morning. Pt has cardiac history. Denies MI. Denies SOB. Two nitroglycerin did not help. Received 324mg  asa with EMS. AND 8 MG OF MORPHINE

## 2022-10-31 NOTE — ED Provider Notes (Signed)
[place] EMERGENCY DEPARTMENT AT [facility] Provider Note   CSN: [id number] Arrival date & time: 10/31/22  1420     History  Chief Complaint  Patient presents with   Chest Pain    BRITTANY AMIRAULT is a 67 y.o. male.   Chest Pain   67 year old male presents emergency department with complaints of dizziness.  Patient states that he has been having positional worsening dizziness over the past week or so.  States that he feels that whenever he sits up from a seated laying down position or standing up from a seated position.  States that today, had intense dizzy sensation described as room spinning when he laid down to work on his side-by-side.  States that he laid there for a couple of minutes and symptoms resolved.  States that he went to move again and symptoms worsen.  Reportedly became pale appearing, sweaty and had a couple episodes of vomiting.  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  States that this feels different.  Denies any visual disturbance from baseline, difficulty speaking, swallowing, weakness/sensory deficit in upper lower extremities.  Does report his gait has been different from his baseline though reports baseline gait instability.  Patient describing also some chest discomfort.  States that he has had intermittent chest discomfort ever since cardiac catheterization and stents placed in May 2023.  States that it does not feel different than what he has been experiencing since then.  Denies any shortness of breath.  Past medical history significant for CAD, non-Hodgkin's lymphoma in remission, GERD, basal cell carcinoma, diabetes mellitus, neuromuscular disorder, COPD, hyperlipidemia, hypertension  Home Medications Prior to Admission medications   Medication Sig Start Date End Date Taking? Authorizing Provider  diazepam (VALIUM) 5 MG tablet Take 1 tablet (5 mg total) by mouth every 12 (twelve) hours as needed  (dizziness). 10/31/22  Yes Sherian Maroon A, PA  metoCLOPramide (REGLAN) 10 MG tablet Take 1 tablet (10 mg total) by mouth every 6 (six) hours as needed for nausea. 10/31/22  Yes Sherian Maroon A, PA  albuterol (PROVENTIL) (2.5 MG/3ML) 0.083% nebulizer solution Take 2.5 mg by nebulization every 4 (four) hours as needed for wheezing or shortness of breath.    [provider]  ALBUTEROL SULFATE HFA IN Inhale into the lungs. AS DIRECTED PRN    [provider]  atorvastatin (LIPITOR) 40 MG tablet Take 1 tablet (40 mg total) by mouth daily. 06/27/22   Swinyer, Zachary George, NP  augmented betamethasone dipropionate (DIPROLENE-AF) 0.05 % cream Apply 1 Application topically to affected areas on skin as directed. 07/24/22   Ardith Dark, MD  azelastine (ASTELIN) 0.1 % nasal spray Place 2 sprays into both nostrils 2 (two) times daily. 05/15/22   Ardith Dark, MD  benzonatate (TESSALON) 200 MG capsule Take 1 capsule by mouth 2 times daily as needed for cough. 09/20/21   Ardith Dark, MD  celecoxib (CELEBREX) 200 MG capsule Take 1 capsule (200 mg total) by mouth 2 (two) times daily as needed. 09/21/22   Ardith Dark, MD  cephALEXin (KEFLEX) 500 MG capsule Take 1 capsule (500 mg total) by mouth 4 (four) times daily. 10/09/22   [name], DPM  ciclopirox (PENLAC) 8 % solution Apply topically at bedtime. Apply over nail and surrounding skin. Apply daily over previous coat. After seven (7) days, may remove with alcohol and continue cycle. 11/22/21   [name], DPM  clopidogrel (PLAVIX) 75 MG tablet Take 1 tablet (75 mg total) by mouth daily with breakfast. 03/27/22   Pricilla Riffle, MD  cyclobenzaprine (FLEXERIL) 10 MG tablet Take 1 tablet (10 mg total) by mouth 3 (three) times daily as needed for muscle spasms. 12/09/20   Val Eagle D, NP  docusate sodium (COLACE) 100 MG capsule Take 1 capsule (100 mg total) by mouth 2 (two) times daily. 12/09/20   Val Eagle D, NP   donepezil (ARICEPT) 5 MG tablet Take 1 tablet (5 mg total) by mouth daily. 10/17/22   Ardith Dark, MD  DULoxetine (CYMBALTA) 30 MG capsule Take 1 capsule (30 mg total) by mouth at bedtime. 06/27/22   Ardith Dark, MD  fluticasone-salmeterol (ADVAIR) 100-50 MCG/ACT AEPB Inhale 1 puff into the lungs 2 (two) times daily. 08/29/22   Jetty Duhamel D, MD  gabapentin (NEURONTIN) 300 MG capsule Take 4 capsules (1,200 mg total) by mouth 3 (three) times daily. 10/12/22 10/12/23  Ardith Dark, MD  glimepiride (AMARYL) 4 MG tablet Take 1/2 tablet by mouth before biggest meal of the day. 02/27/22   Ardith Dark, MD  isosorbide mononitrate (IMDUR) 30 MG 24 hr tablet Take 1/2 tablet (15 mg total) by mouth daily. 04/03/22   Gaston Islam., NP  levalbuterol [facility] HFA) 45 MCG/ACT inhaler Inhale 2 puffs into the lungs daily as needed for wheezing or shortness of breath. 05/11/22   Ardith Dark, MD  loratadine (CLARITIN) 10 MG tablet Take 10 mg by mouth daily.    [provider]  metFORMIN (GLUCOPHAGE) 1000 MG tablet Take 1 tablet (1,000 mg total) by mouth 2 (two) times daily. 09/04/22   Ardith Dark, MD  Multiple Vitamin (MULTIVITAMIN) capsule Take 1 capsule by mouth daily.    [provider]  nitroGLYCERIN (NITROSTAT) 0.4 MG SL tablet Place 1 tablet (0.4 mg total) under the tongue every 5 (five) minutes as needed for chest pain. If no relief after 3 doses call 911. 09/13/22   Pricilla Riffle, MD  OVER THE COUNTER MEDICATION Take 1 tablet by mouth daily. Pharmacist, community, Historical, MD  oxyCODONE-acetaminophen (PERCOCET/ROXICET) 5-325 MG tablet Take 1 - 2 tablets by mouth every 6 hours as needed for severe pain. 08/21/22   [name], DPM  pantoprazole (PROTONIX) 40 MG tablet Take 1 tablet (40 mg total) by mouth daily. 05/29/22   Ardith Dark, MD  promethazine (PHENERGAN) 25 MG tablet Take 1 tablet (25 mg total) by mouth every 8 (eight) hours as needed for nausea or  vomiting. 08/16/22   [name], DPM  tamsulosin (FLOMAX) 0.4 MG CAPS capsule Take 1 capsule (0.4 mg) by mouth daily. 09/11/22   Ardith Dark, MD  traMADol (ULTRAM) 50 MG tablet Take 1 tablet (50 mg total) by mouth every 8 (eight) hours as needed. 09/11/22     traZODone (DESYREL) 50 MG tablet Take 1/2 - 1 tablet (25 - 50 mg) by mouth at bedtime as needed for sleep. 09/11/22   Ardith Dark, MD      Allergies    Patient has no known allergies.    Review of Systems   Review of Systems  Cardiovascular:  Positive for chest pain.  All other systems reviewed and are negative.   Physical Exam Updated Vital Signs BP 129/84 (BP Location: Right Arm)   Pulse 69   Temp 97.7 F (36.5 C) (Oral)   Resp 17  Ht 6\' 3"  (1.905 m)   Wt 108.9 kg   SpO2 99%   BMI 30.00 kg/m  Physical Exam Vitals and nursing note reviewed.  Constitutional:      General: He is not in acute distress.    Appearance: He is well-developed.  HENT:     Head: Normocephalic and atraumatic.  Eyes:     Conjunctiva/sclera: Conjunctivae normal.  Cardiovascular:     Rate and Rhythm: Normal rate and regular rhythm.     Heart sounds: No murmur heard. Pulmonary:     Effort: Pulmonary effort is normal. No respiratory distress.     Comments: Faint wheeze auscultated bilateral lung fields. Abdominal:     Palpations: Abdomen is soft.     Tenderness: There is no abdominal tenderness.  Musculoskeletal:        General: No swelling.     Cervical back: Neck supple.     Right lower leg: No edema.     Left lower leg: No edema.  Skin:    General: Skin is warm and dry.     Capillary Refill: Capillary refill takes less than 2 seconds.  Neurological:     Mental Status: He is alert.     Comments: Alert and oriented to self, place, time and event.   Speech is fluent, clear without dysarthria or dysphasia.   Strength 5/5 in upper/lower extremities   Sensation intact in upper/lower extremities   Patient unable to take 2  steps independently without feeling as if you are going to fall.  Reports worsening from baseline of gait instability. No pronator drift.  Normal finger-to-nose and feet tapping.  CN I not tested  CN II not tested CN III, IV, VI PERRLA and EOMs intact bilaterally.  Slight horizontal rightward beating nystagmus on EOMs. CN V Intact sensation to sharp and light touch to the face  CN VII facial movements symmetric  CN VIII not tested  CN IX, X no uvula deviation, symmetric rise of soft palate  CN XI 5/5 SCM and trapezius strength bilaterally  CN XII Midline tongue protrusion, symmetric L/R movements     Psychiatric:        Mood and Affect: Mood normal.     ED Results / Procedures / Treatments   Labs (all labs ordered are listed, but only abnormal results are displayed) Labs Reviewed  BASIC METABOLIC PANEL - Abnormal; Notable for the following components:      Result Value   Glucose, Bld 148 (*)    All other components within normal limits  CBC - Abnormal; Notable for the following components:   WBC 11.4 (*)    All other components within normal limits  TROPONIN I (HIGH SENSITIVITY)  TROPONIN I (HIGH SENSITIVITY)    EKG None  Radiology MR BRAIN WO CONTRAST  Result Date: 10/31/2022 CLINICAL DATA:  Acute neurologic deficit.  Chest pain. EXAM: MRI HEAD WITHOUT CONTRAST TECHNIQUE: Multiplanar, multiecho pulse sequences of the brain and surrounding structures were obtained without intravenous contrast. COMPARISON:  02/03/2017 FINDINGS: Brain: No acute infarct, mass effect or extra-axial collection. No chronic microhemorrhage or siderosis. There is multifocal hyperintense T2-weighted signal within the Das matter. Generalized volume loss. The midline structures are normal. Vascular: Abnormal left vertebral artery flow void, unchanged. The other flow voids are normal. Skull and upper cervical spine: Normal marrow signal. Sinuses/Orbits: Negative. Other: None. IMPRESSION: 1. No acute  intracranial abnormality. 2. 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 3. Findings of chronic small vessel ischemia  and volume loss. Electronically Signed   By: Deatra Robinson M.D.   On: 10/31/2022 20:14   DG Chest 2 View  Result Date: 10/31/2022 CLINICAL DATA:  chest pain.  Shortness of breath.  Nausea/vomiting. EXAM: CHEST - 2 VIEW COMPARISON:  12/29/2021. FINDINGS: Redemonstration of linear area of scarring/atelectasis overlying the right mid lung zone. Bilateral lung fields are otherwise clear. No acute consolidation or lung collapse. Bilateral costophrenic angles are clear. Normal cardio-mediastinal silhouette. No acute osseous abnormalities. The soft tissues are within normal limits. IMPRESSION: No active cardiopulmonary disease. Electronically Signed   By: Jules Schick M.D.   On: 10/31/2022 16:42    Procedures Procedures    Medications Ordered in ED Medications  ondansetron (ZOFRAN) injection 4 mg (4 mg Intravenous Given 10/31/22 1533)  sodium chloride 0.9 % bolus 500 mL (0 mLs Intravenous Stopped 10/31/22 2031)  meclizine (ANTIVERT) tablet 25 mg (25 mg Oral Given 10/31/22 1534)  metoCLOPramide (REGLAN) injection 10 mg (10 mg Intravenous Given 10/31/22 1806)  diphenhydrAMINE (BENADRYL) injection 25 mg (25 mg Intravenous Given 10/31/22 1806)    ED Course/ Medical Decision Making/ A&P                                 Medical Decision Making Amount and/or Complexity of Data Reviewed Labs: ordered. Radiology: ordered.  Risk Prescription drug management.   This patient presents to the ED for concern of dizziness, this involves an extensive number of treatment options, and is a complaint that carries with it a high risk of complications and morbidity.  The differential diagnosis includes CVA, Mnire's disease, BPPV, labyrinthitis, medication side effect, ACS, PE, arterial dissection   Co morbidities that complicate the patient evaluation  See  HPI   Additional history obtained:  Additional history obtained from EMR External records from outside source obtained and reviewed including hospital records   Lab Tests:  I Ordered, and personally interpreted labs.  The pertinent results include: Leukocytosis of 11.40.  No evidence of anemia.  Platelets within range.  No Electra abnormalities.  No renal dysfunction.  Initial troponin of 3 with repeat for   Imaging Studies ordered:  I ordered imaging studies including chest x-ray, MRI brain I independently visualized and interpreted imaging which showed  Chest x-ray: No acute cardiopulmonary abnormality MRI brain: No acute intracranial abnormality.  Unchanged abnormal left vertebral artery flow void likely occlusion.  Chronic small vessel ischemic and volume loss. I agree with the radiologist interpretation  Cardiac Monitoring: / EKG:  The patient was maintained on a cardiac monitor.  I personally viewed and interpreted the cardiac monitored which showed an underlying rhythm of: Sinus rhythm   Consultations Obtained:  I requested consultation with attending physician Dr. Rubin Payor who is in agreement  Problem List / ED Course / Critical interventions / Medication management  Dizziness I ordered medication including 5 cc normal saline, Antivert, Zofran, Reglan, Benadryl   Reevaluation of the patient after these medicines showed that the patient resolved I have reviewed the patients home medicines and have made adjustments as needed   Social Determinants of Health:  Former cigarette use.  Denies illicit drug use.   Test / Admission - Considered:  Dizziness Vitals signs within normal range and stable throughout visit. Laboratory/imaging studies significant for: See above 67 year old male presents emergency department with complaints of dizziness.  Patient reporting vague dizzy type sensations over the past week or so with acute worsening today.  Symptoms seem to worsen  when he was changing positions of his head when he is working on his motor vehicle.  On exam initially, patient unable to ambulate independently and with appreciable right-sided horizontal nystagmus but otherwise, neuroexam nonfocal.  Patient initially screened by triage staff with concerns for chest pain.  Patient per chart review has been having intermittent chest discomfort ever since cardiac cath and stent placement in 06/10/2021 and patient subsequently had stress test on 02/03/2022 that was reassuring.  Today, patient with delta negative troponin, lack of acute ischemic change on EKG.  Low suspicion for ACS.  Patient states that this chest discomfort is not any different from his baseline ever since prior cardiac catheterization.  Regarding dizziness, symptoms refractory to antiemetic as well as meclizine administered at first.  Given patient's inability to ambulate independently, concern for central cause of vertigo.  MRI was subsequently obtained which was negative for any acute abnormality but did show evidence of patient's known left vertebral artery flow void that was present in 2013 and appears unchanged.  Patient treated with Reglan and noted complete resolution of symptoms.  Suspect patient's symptoms are likely secondary to peripheral cause of vertigo.  Recommend continued use of as needed medication in the outpatient setting and follow-up with the ENT.  Treatment plan discussed at length with patient and he acknowledged understanding was agreeable to said plan.  Patient able to ambulate independently without difficulty, tolerating p.o. without repeat bouts of vomiting.  Patient overall well-appearing, afebrile in no acute distress. Worrisome signs and symptoms were discussed with the patient, and the patient acknowledged understanding to return to the ED if noticed. Patient was stable upon discharge.          Final Clinical Impression(s) / ED Diagnoses Final diagnoses:  Vertigo    Rx / DC  Orders ED Discharge Orders          Ordered    metoCLOPramide (REGLAN) 10 MG tablet  Every 6 hours PRN        10/31/22 2102    diazepam (VALIUM) 5 MG tablet  Every 12 hours PRN        10/31/22 2102              Peter Garter, [place] 10/31/22 2247    Benjiman Core, MD 11/01/22 [phone]

## 2022-11-01 ENCOUNTER — Other Ambulatory Visit: Payer: Self-pay | Admitting: Family Medicine

## 2022-11-01 ENCOUNTER — Other Ambulatory Visit (HOSPITAL_COMMUNITY): Payer: Self-pay

## 2022-11-02 ENCOUNTER — Other Ambulatory Visit (HOSPITAL_COMMUNITY): Payer: Self-pay

## 2022-11-02 ENCOUNTER — Other Ambulatory Visit: Payer: Self-pay

## 2022-11-02 MED ORDER — GLIMEPIRIDE 4 MG PO TABS
2.0000 mg | ORAL_TABLET | Freq: Every day | ORAL | 1 refills | Status: DC
  Filled 2022-11-02: qty 45, 90d supply, fill #0
  Filled 2023-01-31: qty 45, 90d supply, fill #1
  Filled 2023-04-25: qty 30, 60d supply, fill #2

## 2022-11-02 MED ORDER — LEVALBUTEROL TARTRATE 45 MCG/ACT IN AERO
2.0000 | INHALATION_SPRAY | Freq: Every day | RESPIRATORY_TRACT | 1 refills | Status: DC | PRN
  Filled 2022-11-02: qty 15, 100d supply, fill #0

## 2022-11-03 ENCOUNTER — Encounter: Payer: Self-pay | Admitting: Family Medicine

## 2022-11-03 ENCOUNTER — Encounter: Payer: Self-pay | Admitting: Oncology

## 2022-11-03 ENCOUNTER — Ambulatory Visit: Payer: Medicare PPO | Admitting: Family Medicine

## 2022-11-03 ENCOUNTER — Encounter: Payer: Self-pay | Admitting: Physician Assistant

## 2022-11-03 VITALS — BP 119/70 | HR 92 | Temp 97.8°F | Ht 75.0 in | Wt 244.8 lb

## 2022-11-03 DIAGNOSIS — Z23 Encounter for immunization: Secondary | ICD-10-CM

## 2022-11-03 DIAGNOSIS — I152 Hypertension secondary to endocrine disorders: Secondary | ICD-10-CM | POA: Diagnosis not present

## 2022-11-03 DIAGNOSIS — R413 Other amnesia: Secondary | ICD-10-CM | POA: Diagnosis not present

## 2022-11-03 DIAGNOSIS — J449 Chronic obstructive pulmonary disease, unspecified: Secondary | ICD-10-CM | POA: Diagnosis not present

## 2022-11-03 DIAGNOSIS — R42 Dizziness and giddiness: Secondary | ICD-10-CM | POA: Diagnosis not present

## 2022-11-03 DIAGNOSIS — E1159 Type 2 diabetes mellitus with other circulatory complications: Secondary | ICD-10-CM

## 2022-11-03 NOTE — Assessment & Plan Note (Signed)
Currently on Aricept 5 mg daily.  He is tolerating this well although still is frequent memory issues.  He attributes this mostly to his chemotherapy from several years ago.  Will be referred to neurology as above for further evaluation.

## 2022-11-03 NOTE — Assessment & Plan Note (Signed)
Following with pulmonology.  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  He recently changed from Trelegy due to concern it causing memory issues.  Symptoms have worsened significantly since then.  He will call to schedule appointment with pulmonology to discuss alternatives.

## 2022-11-03 NOTE — Assessment & Plan Note (Signed)
Blood pressure at goal today on Imdur 15 mg daily

## 2022-11-03 NOTE — Progress Notes (Signed)
Jeffery Wood is a 67 y.o. male who presents today for an office visit.  Assessment/Plan:  New/Acute Problems: Vertigo Likely BPPV based on history however differential does include TIA especially with his MRI showing occluded left vertebral artery.  Patient noted that symptoms significantly worsened when extending his neck fully but did not have any change in symptoms when rotating head to the left or right.  It is possible he could have had a transiently occluded right vertebral artery that was exacerbated by full neck extension.  He has not had any recurrence of symptoms the last few days.  Reassuring neurologic exam today.  Will refer to vestibular rehab to treat likely BPPV however based on MRI findings we will also refer to neurology for further evaluation for his occluded left vertebral artery.  We discussed reasons return to care and seek emergent care.  Chest Discomfort  He has been following with cardiology for this and had a Myoview 9 months ago that did not show any significant ischemia.  He recently changed his control on her blood pulmonology and symptoms have worsened significantly.  He is also noticed improvement in symptoms with using albuterol.  Symptoms are likely secondary to bronchospasm or other pulmonary etiology.  Advised him to call to schedule appointment with his pulmonologist to discuss change to his treatment plan.  Chronic Problems Addressed Today: COPD mixed type [facility]) Following with pulmonology.  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  He recently changed from Trelegy due to concern it causing memory issues.  Symptoms have worsened significantly since then.  He will call to schedule appointment with pulmonology to discuss alternatives.  Memory impairment Currently on Aricept 5 mg daily.  He is tolerating this well although still is frequent memory issues.  He attributes this mostly to his chemotherapy from several years  ago.  Will be referred to neurology as above for further evaluation.  Hypertension associated with diabetes (HCC) Blood pressure at goal today on Imdur 15 mg daily  Preventative health care-flu shot given today.    Subjective:  HPI:  Patient here today for ED follow-up.  Went to the ED 3 days ago with sudden onset dizziness that had been going on for about a week or so.  Patient was having positional dizziness and time he was going from a sitting position to standing or standing up from his lying down.  He had intense room spinning sensation resulting in diaphoretic episode with a couple episodes of vomiting.  EMS was called and he was transported to the ED.  He has also been having intermittent chest discomfort.  In the ED he was initially unable to independently ambulate and was noted to have significant right-sided horizontal nystagmus.  He had extensive workup in the ED.  His EKG was reassuring and delta troponin was negative.  He was given antiemetics and meclizine for his dizziness however symptoms did not improve.  MRI was obtained which was negative for acute abnormalities however did show abnormal left vertebral artery flow that was unchanged from prior exam.  He was given Reglan and symptoms resolved.  He was discharged home to follow-up with ENT.  Patient does note that his exacerbation of symptoms occurred when extending his neck fully backwards however did not have any symptoms when rotating to the left or right.  Over the last few days he has been improving. He has not had any recurrence of dizziness.  No weakness or numbness.  He  still having intermittent chest discomfort.  Worse with exertion.  He thinks it may be related to his breathing.  He did stop Trelegy several months ago due to concern of may be worsening his mental function.  His pulmonologist switch him back to Advair.  Since then he has been using albuterol multiple times per day.  He does note that his chest discomfort  improves after using the albuterol.       Objective:  Physical Exam: BP 119/70   Pulse 92   Temp 97.8 F (36.6 C) (Temporal)   Ht 6\' 3"  (1.905 m)   Wt 244 lb 12.8 oz (111 kg)   SpO2 96%   BMI 30.60 kg/m   Gen: No acute distress, resting comfortably CV: Regular rate and rhythm with no murmurs appreciated Pulm: Normal work of breathing, several scattered wheezes and faint rhonchi noted throughout all lung fields. Neuro: Current nerves II through XII intact.  Finger-nose-finger testing intact bilaterally.  Rapid alternating movement intact bilaterally.  Strength 5 out of 5 throughout.  Sensation light touch intact throughout.  Reflexes 2+ and symmetric bilaterally. Psych: Normal affect and thought content  Time Spent: 45 minutes of total time was spent on the date of the encounter performing the following actions: chart review prior to seeing the patient including recent Emergency Department visit, obtaining history, performing a medically necessary exam, counseling on the treatment plan, placing orders, and documenting in our EHR.        Katina Degree. Jimmey Ralph, MD 11/03/2022 10:57 AM

## 2022-11-03 NOTE — Patient Instructions (Addendum)
It was very nice to see you today!  We will refer you to see the neurologist  We will refer you to see the vestibular rehab specialist.   Please discuss changing your inhalers with your pulmonologist soon.  Return if symptoms worsen or fail to improve.   Take care, Dr Jimmey Ralph  PLEASE NOTE:  If you had any lab tests, please let us know if you have not heard back within a few days. You may see your results on mychart before we have a chance to review them but we will give you a call once they are reviewed by [place].   If we ordered any referrals today, please let us know if you have not heard from their office within the next week.   If you had any urgent prescriptions sent in today, please check with the pharmacy within an hour of our visit to make sure the prescription was transmitted appropriately.   Please try these tips to maintain a healthy lifestyle:  Eat at least 3 REAL meals and 1-2 snacks per day.  Aim for no more than 5 hours between eating.  If you eat breakfast, please do so within one hour of getting up.   Each meal should contain half fruits/vegetables, one quarter protein, and one quarter carbs (no bigger than a computer mouse)  Cut down on sweet beverages. This includes juice, soda, and sweet tea.   Drink at least 1 glass of water with each meal and aim for at least 8 glasses per day  Exercise at least 150 minutes every week.

## 2022-11-05 NOTE — Progress Notes (Signed)
Subjective:    Patient ID: Jeffery Wood, male    DOB: 11-13-1955, 67 y.o.   MRN: [id number]  [07/22/13- Dr Shelle Iron- From review ol notes: The patient is a 67 year old male who I've been asked to see for management of obstructive sleep apnea.  He was apparently diagnosed 10 years ago, and was tried on CPAP with very poor tolerance. He tells me he was tried on all different types of machines, pressure settings, and different masks without success. He could simply not keep the mask on his face. This is gone untreated over the years, and he recently underwent a home sleep test in March of this year which showed moderate OSA. He had an AHI of 24 events per hour with desaturation as low as 85%. The patient states that he is still having loud snoring, and bed partner comments on witnessed apneas.  He has frequent awakenings at night, and is not rested in the mornings upon arising. He has significant daytime sleepiness with any inactivity, and will fall asleep in the evenings watching television or movies. His Epworth score today is 22, and the patient tells that he has lost 30 pounds over the last 2 years.] PFT 11/23/21- Severe obstruction with signif response to BD, Nl DLCO ------------------------------------------                                                          //status of OSA//?  08/29/22- 66 yoM former smoker( 60 pk yrs) followed for COPD, complicated by HTN, DM, CAD, OSA/ failed CPAP, Insomnia, GERD, Neuromuscular Disorder, Psoriasis, Lumbar Spondylolisthesis, BPH, Non-Hodkins Lymphoma, Gout,  -Neb albuterol, Xopenex hfa,  Pharmacy told him Trelegy affected memory, switched to Advair 100 We discussed Advair. Advised him to ask his PCP for Neurology referral to discuss memory concerns.  11/07/22-  36 yoM former smoker( 60 pk yrs) followed for COPD, complicated by HTN, DM, CAD, OSA/ failed CPAP, Insomnia, GERD, Neuromuscular Disorder, Psoriasis, Lumbar Spondylolisthesis, BPH, Non-Hodkins  Lymphoma, Gout,  -Neb albuterol, albuterol hfa, Advair 100, Spiriva,  ------Breathing has not been the best  ACT 14 Had flu vax. Trelegy worked best for him as a maintenance bronchodilator but a pharmacist told him it might affect his memory, which is a concern for him.  He is now only using his Xopenex HFA inhaler.  We discussed options.  He will be seeing neurology next month for memory assessment. CXR 10/31/22- IMPRESSION: No active cardiopulmonary disease.    ROS-see HPI   + = positive Constitutional:    weight loss, night sweats, fevers, chills, fatigue, lassitude. HEENT:    headaches, difficulty swallowing, tooth/dental problems, sore throat,       sneezing, itching, ear ache, nasal congestion, post nasal drip, snoring CV:    chest pain, orthopnea, PND, swelling in lower extremities, anasarca,                                   dizziness, palpitations Resp:   +shortness of breath with exertion or at rest.                productive cough,   +non-productive cough, coughing up of blood.              change  in color of mucus.  wheezing.   Skin:    rash or lesions. GI:  No-   heartburn, indigestion, abdominal pain, nausea, vomiting, diarrhea,                 change in bowel habits, loss of appetite GU: dysuria, change in color of urine, no urgency or frequency.   flank pain. MS:   joint pain, stiffness, decreased range of motion, back pain. Neuro-     nothing unusual Psych:  change in mood or affect.  depression or anxiety.   memory loss.  OBJ- Physical Exam General- Alert, Oriented, Affect-appropriate, Distress- none acute, +full beard Skin- rash-none, lesions- none, excoriation- none Lymphadenopathy- none Head- atraumatic            Eyes- Gross vision intact, PERRLA, conjunctivae and secretions clear            Ears- Hearing, canals-normal            Nose- Clear, no-Septal dev, mucus, polyps, erosion, perforation             Throat- Mallampati II , mucosa clear , drainage- none,  tonsils- atrophic Neck- flexible , trachea midline, no stridor , thyroid nl, carotid no bruit Chest - symmetrical excursion , unlabored           Heart/CV- RRR , no murmur , no gallop  , no rub, nl s1 s2                           - JVD- none , edema- none, stasis changes- none, varices- none           Lung- +diminished, wheeze+R back, cough- none , dullness-none, rub- none           Chest wall-  Abd-  Br/ Gen/ Rectal- Not done, not indicated Extrem- cyanosis- none, clubbing, none, atrophy- none, strength- nl Neuro- grossly intact to observation     Assessment & Plan:

## 2022-11-06 ENCOUNTER — Inpatient Hospital Stay: Payer: Medicare PPO | Attending: Oncology | Admitting: Oncology

## 2022-11-06 ENCOUNTER — Other Ambulatory Visit (HOSPITAL_COMMUNITY): Payer: Self-pay

## 2022-11-06 ENCOUNTER — Encounter: Payer: Self-pay | Admitting: Oncology

## 2022-11-06 VITALS — BP 118/79 | HR 82 | Temp 98.1°F | Resp 18 | Ht 75.0 in | Wt 240.0 lb

## 2022-11-06 DIAGNOSIS — Z87891 Personal history of nicotine dependence: Secondary | ICD-10-CM | POA: Diagnosis not present

## 2022-11-06 DIAGNOSIS — C8330 Diffuse large B-cell lymphoma, unspecified site: Secondary | ICD-10-CM | POA: Diagnosis not present

## 2022-11-06 DIAGNOSIS — G8929 Other chronic pain: Secondary | ICD-10-CM | POA: Insufficient documentation

## 2022-11-06 DIAGNOSIS — K5900 Constipation, unspecified: Secondary | ICD-10-CM | POA: Insufficient documentation

## 2022-11-06 DIAGNOSIS — Z79899 Other long term (current) drug therapy: Secondary | ICD-10-CM | POA: Insufficient documentation

## 2022-11-06 DIAGNOSIS — R413 Other amnesia: Secondary | ICD-10-CM | POA: Insufficient documentation

## 2022-11-06 DIAGNOSIS — M109 Gout, unspecified: Secondary | ICD-10-CM | POA: Diagnosis not present

## 2022-11-06 DIAGNOSIS — C851A Unspecified b-cell lymphoma, in remission: Secondary | ICD-10-CM | POA: Diagnosis not present

## 2022-11-06 DIAGNOSIS — I6502 Occlusion and stenosis of left vertebral artery: Secondary | ICD-10-CM | POA: Diagnosis not present

## 2022-11-06 DIAGNOSIS — M79621 Pain in right upper arm: Secondary | ICD-10-CM | POA: Insufficient documentation

## 2022-11-06 DIAGNOSIS — R079 Chest pain, unspecified: Secondary | ICD-10-CM | POA: Insufficient documentation

## 2022-11-06 DIAGNOSIS — M545 Low back pain, unspecified: Secondary | ICD-10-CM | POA: Diagnosis not present

## 2022-11-06 DIAGNOSIS — R200 Anesthesia of skin: Secondary | ICD-10-CM | POA: Insufficient documentation

## 2022-11-06 DIAGNOSIS — E119 Type 2 diabetes mellitus without complications: Secondary | ICD-10-CM | POA: Diagnosis not present

## 2022-11-06 DIAGNOSIS — R42 Dizziness and giddiness: Secondary | ICD-10-CM | POA: Diagnosis not present

## 2022-11-06 DIAGNOSIS — J4489 Other specified chronic obstructive pulmonary disease: Secondary | ICD-10-CM | POA: Insufficient documentation

## 2022-11-06 DIAGNOSIS — C7951 Secondary malignant neoplasm of bone: Secondary | ICD-10-CM | POA: Diagnosis not present

## 2022-11-06 NOTE — Progress Notes (Signed)
[facility] OFFICE PROGRESS NOTE   Diagnosis: Non-Hodgkin's lymphoma  INTERVAL HISTORY:   Jeffery Wood returns as scheduled.  No fever, night sweats, or palpable lymph nodes.  He reports a good appetite.  He relates weight loss to changing his diet.  He has intermittent pain in the right upper arm. Jeffery Wood reports memory loss, irregular bowel habits, and wheezing.  He reports the wheezing progressed when he was taken off of Trelegy.  He is scheduled for follow-up with pulmonary medicine.  He was seen emergency room 10/31/2022 with dizziness and chest pain.  These symptoms have resolved.  He has been referred to neurology to evaluate for vertigo.  An MRI of the brain revealed unchanged occlusion of the left vertebral artery.    Objective:  Vital signs in last 24 hours:  Blood pressure 118/79, pulse 82, temperature 98.1 F (36.7 C), temperature source Temporal, resp. rate 18, height 6\' 3"  (1.905 m), weight 240 lb (108.9 kg), SpO2 98%.    Lymphatics: No cervical, supraclavicular, axillary, or inguinal nodes Resp: Lungs clear bilaterally Cardio: Regular rate and rhythm GI: No hepatosplenomegaly Vascular: No leg edema Musculoskeletal: Examination of the right upper arm is unremarkable.  No mass.    Lab Results:  Lab Results  Component Value Date   WBC 11.4 (H) 10/31/2022   HGB 15.1 10/31/2022   HCT 44.9 10/31/2022   MCV 88.4 10/31/2022   PLT 277 10/31/2022   NEUTROABS 4.2 12/02/2018    CMP  Lab Results  Component Value Date   NA 137 10/31/2022   K 4.0 10/31/2022   CL 102 10/31/2022   CO2 23 10/31/2022   GLUCOSE 148 (H) 10/31/2022   BUN 12 10/31/2022   CREATININE 0.72 10/31/2022   CALCIUM 9.3 10/31/2022   PROT 6.4 09/13/2022   ALBUMIN 4.3 09/13/2022   AST 20 09/13/2022   ALT 18 09/13/2022   ALKPHOS 106 09/13/2022   BILITOT 0.6 09/13/2022   GFRNONAA >60 10/31/2022   GFRAA >60 01/18/2018    Medications: I have reviewed the patient's current  medications.   Assessment/Plan: High-grade B-cell non-Hodgkin's lymphoma, molecular studies pending, clinical stage IV, IPI- low intermediate risk, FISH panel revealed an [id number] fusion (MYC/IgH), negative for BCL 6 and BCL-2 Chest x-ray 01/15/2017 - medial right upper lobe/perihilar mass.   Chest CT 01/19/2017 - mass constricting the superior vena cava, subcarinal adenopathy, right suprahilar/mediastinal mass.   01/24/2017 status post bronchoscopy with transbronchial biopsy of level 7 node and biopsy of a right upper lobe lung mass by Dr. Tyrone Sage.  There was compression of the right mainstem bronchus.  A mass was noted in the right upper lobe.  Mediastinal lymph nodes were seen on EBUS.  Multiple biopsies of a level 7 node were obtained.  Brushings and a biopsy of the right upper lobe mass were obtained.  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  Final pathology was nondiagnostic.   PET scan on 02/02/2017 showed hypermetabolism corresponding to the right sided mediastinal mass, progressive since the CT 01/19/2017.  Metastatic disease/lymphoma within the bones, pericardium, stomach, bowel and abdominopelvic nodal stations.   Brain MRI 02/03/2017 showed no evidence of intracranial metastases or acute abnormality.   02/05/2017 status post mediastinoscopy.   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  Molecular studies are pending. Rituximab 02/08/2017 Cycle 1 CHOP 02/09/2017 Cycle 1 R-EPOCH 03/02/2017 Cycle 2 R-EPOCH 03/23/2017 Prophylactic intrathecal methotrexate 03/27/2017 Restaging PET scan 04/11/2017-significant decrease in FDG uptake associated with  the chest, abdomen and pelvis and axial and proximal appendicular skeleton.  No new foci of abnormal radiotracer uptake identified. Cycle 3 R-EPOCH 04/13/2017 with intrathecal methotrexate prophylaxis Cycle 4 R-EPOCH 05/04/2017 with intrathecal methotrexate prophylaxis Cycle 5 R- EPOCH 05/25/2017 with intrathecal  methotrexate prophylaxis   Chest/upper back and right arm pain secondary to #1- resolved. Exertional dyspnea secondary to #1 and COPD.  Improved. 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     Disposition: Jeffery Wood remains in clinical remission from non-Hodgkin's lymphoma.  He is now greater than 5 years out from diagnosis.  He has a good prognosis for a long-term disease-free survival.  He has multiple medical conditions and will continue follow-up with Dr Jimmey Ralph, pulmonary medicine, and neurology.  He would like to continue follow-up at the cancer center.  He will return for an office visit in 6 months.  I am available to see him sooner as needed.  Thornton Papas, MD  11/06/2022  8:14 AM

## 2022-11-07 ENCOUNTER — Ambulatory Visit: Payer: Medicare PPO | Admitting: Internal Medicine

## 2022-11-07 ENCOUNTER — Other Ambulatory Visit (HOSPITAL_COMMUNITY): Payer: Self-pay

## 2022-11-07 ENCOUNTER — Other Ambulatory Visit: Payer: Self-pay

## 2022-11-07 ENCOUNTER — Encounter: Payer: Self-pay | Admitting: Internal Medicine

## 2022-11-07 VITALS — BP 127/75 | HR 84 | Ht 75.0 in | Wt 246.0 lb

## 2022-11-07 DIAGNOSIS — R413 Other amnesia: Secondary | ICD-10-CM

## 2022-11-07 DIAGNOSIS — J449 Chronic obstructive pulmonary disease, unspecified: Secondary | ICD-10-CM

## 2022-11-07 MED ORDER — TRELEGY ELLIPTA 100-62.5-25 MCG/ACT IN AEPB
INHALATION_SPRAY | RESPIRATORY_TRACT | 12 refills | Status: DC
  Filled 2022-11-07: qty 60, fill #0
  Filled 2022-11-07: qty 60, 30d supply, fill #0
  Filled 2022-12-02: qty 60, 30d supply, fill #1
  Filled 2023-01-01: qty 60, 30d supply, fill #2
  Filled 2023-01-31: qty 60, 30d supply, fill #3
  Filled 2023-03-12: qty 60, 30d supply, fill #4
  Filled 2023-04-25: qty 60, 30d supply, fill #5
  Filled 2023-05-29: qty 60, 30d supply, fill #6
  Filled 2023-06-27: qty 60, 30d supply, fill #7
  Filled 2023-08-28: qty 60, 30d supply, fill #8
  Filled 2023-10-10: qty 60, 30d supply, fill #9

## 2022-11-07 NOTE — Patient Instructions (Signed)
Script sent for trelegy 100   inhale 1 puff then rinse your mouth,once daily  You can ask the Neurologist about any memory concerns possibly related to your meds.  Please call if we can help

## 2022-11-14 ENCOUNTER — Other Ambulatory Visit (HOSPITAL_COMMUNITY): Payer: Self-pay

## 2022-11-15 ENCOUNTER — Ambulatory Visit (INDEPENDENT_AMBULATORY_CARE_PROVIDER_SITE_OTHER): Payer: Medicare PPO | Admitting: Family Medicine

## 2022-11-15 ENCOUNTER — Encounter: Payer: Self-pay | Admitting: Family Medicine

## 2022-11-15 VITALS — BP 110/73 | HR 78 | Temp 97.3°F | Ht 75.0 in | Wt 244.4 lb

## 2022-11-15 DIAGNOSIS — I152 Hypertension secondary to endocrine disorders: Secondary | ICD-10-CM | POA: Diagnosis not present

## 2022-11-15 DIAGNOSIS — R739 Hyperglycemia, unspecified: Secondary | ICD-10-CM

## 2022-11-15 DIAGNOSIS — M25511 Pain in right shoulder: Secondary | ICD-10-CM

## 2022-11-15 DIAGNOSIS — E785 Hyperlipidemia, unspecified: Secondary | ICD-10-CM | POA: Diagnosis not present

## 2022-11-15 DIAGNOSIS — N400 Enlarged prostate without lower urinary tract symptoms: Secondary | ICD-10-CM | POA: Diagnosis not present

## 2022-11-15 DIAGNOSIS — Z0000 Encounter for general adult medical examination without abnormal findings: Secondary | ICD-10-CM | POA: Diagnosis not present

## 2022-11-15 DIAGNOSIS — R413 Other amnesia: Secondary | ICD-10-CM

## 2022-11-15 DIAGNOSIS — J449 Chronic obstructive pulmonary disease, unspecified: Secondary | ICD-10-CM

## 2022-11-15 DIAGNOSIS — G4700 Insomnia, unspecified: Secondary | ICD-10-CM | POA: Diagnosis not present

## 2022-11-15 LAB — LIPID PANEL
Cholesterol: 111 mg/dL (ref 0–200)
HDL: 43.7 mg/dL (ref >39.00)
LDL Cholesterol: 55 mg/dL (ref 0–99)
NonHDL: 67.52
Total CHOL/HDL Ratio: 3
Triglycerides: 62 mg/dL (ref 0.0–149.0)
VLDL: 12.4 mg/dL (ref 0.0–40.0)

## 2022-11-15 LAB — CBC
HCT: 45.7 % (ref 39.0–52.0)
Hemoglobin: 14.8 g/dL (ref 13.0–17.0)
MCHC: 32.4 g/dL (ref 30.0–36.0)
MCV: 92.8 fL (ref 78.0–100.0)
Platelets: 235 10*3/uL (ref 150.0–400.0)
RBC: 4.92 Mil/uL (ref 4.22–5.81)
RDW: 14.9 % (ref 11.5–15.5)
WBC: 8.1 10*3/uL (ref 4.0–10.5)

## 2022-11-15 LAB — TSH: TSH: 1.36 u[IU]/mL (ref 0.35–5.50)

## 2022-11-15 LAB — COMPREHENSIVE METABOLIC PANEL
ALT: 18 U/L (ref 0–53)
AST: 24 U/L (ref 0–37)
Albumin: 4 g/dL (ref 3.5–5.2)
Alkaline Phosphatase: 87 U/L (ref 39–117)
BUN: 16 mg/dL (ref 6–23)
CO2: 27 meq/L (ref 19–32)
Calcium: 9.3 mg/dL (ref 8.4–10.5)
Chloride: 103 meq/L (ref 96–112)
Creatinine, Ser: 0.71 mg/dL (ref 0.40–1.50)
GFR: 95.42 mL/min (ref >60.00)
Glucose, Bld: 108 mg/dL — ABNORMAL HIGH (ref 70–99)
Potassium: 4.3 meq/L (ref 3.5–5.1)
Sodium: 138 meq/L (ref 135–145)
Total Bilirubin: 0.6 mg/dL (ref 0.2–1.2)
Total Protein: 6.1 g/dL (ref 6.0–8.3)

## 2022-11-15 LAB — PSA: PSA: 0.65 ng/mL (ref 0.10–4.00)

## 2022-11-15 LAB — VITAMIN B12: Vitamin B-12: 819 pg/mL (ref 211–911)

## 2022-11-15 LAB — HEMOGLOBIN A1C: Hgb A1c MFr Bld: 5.8 % (ref 4.6–6.5)

## 2022-11-15 LAB — MICROALBUMIN / CREATININE URINE RATIO
Creatinine,U: 62.8 mg/dL
Microalb Creat Ratio: 1.1 mg/g (ref 0.0–30.0)
Microalb, Ur: <0.7 mg/dL (ref 0.0–1.9)

## 2022-11-15 NOTE — Assessment & Plan Note (Signed)
Stable on trazodone 25-50 mg nightly.

## 2022-11-15 NOTE — Patient Instructions (Addendum)
It was very nice to see you today!  We will check blood work today.  Please work on the shoulder exercises.  Let me know if you need referral or if you like to come back for a cortisone shot.  Please continue to work on diet and exercise.  Return in about 1 year (around 11/15/2023) for Annual Physical.   Take care, Dr Jimmey Ralph  PLEASE NOTE:  If you had any lab tests, please let us know if you have not heard back within a few days. You may see your results on mychart before we have a chance to review them but we will give you a call once they are reviewed by [place].   If we ordered any referrals today, please let us know if you have not heard from their office within the next week.   If you had any urgent prescriptions sent in today, please check with the pharmacy within an hour of our visit to make sure the prescription was transmitted appropriately.   Please try these tips to maintain a healthy lifestyle:  Eat at least 3 REAL meals and 1-2 snacks per day.  Aim for no more than 5 hours between eating.  If you eat breakfast, please do so within one hour of getting up.   Each meal should contain half fruits/vegetables, one quarter protein, and one quarter carbs (no bigger than a computer mouse)  Cut down on sweet beverages. This includes juice, soda, and sweet tea.   Drink at least 1 glass of water with each meal and aim for at least 8 glasses per day  Exercise at least 150 minutes every week  Preventive Care 65 Years and Older, Male Preventive care refers to lifestyle choices and visits with your health care provider that can promote health and wellness. Preventive care visits are also called wellness exams. What can I expect for my preventive care visit? Counseling During your preventive care visit, your health care provider may ask about your: Medical history, including: Past medical problems. Family medical history. History of falls. Current health, including: Emotional  well-being. Home life and relationship well-being. Sexual activity. Memory and ability to understand (cognition). Lifestyle, including: Alcohol, nicotine or tobacco, and drug use. Access to firearms. Diet, exercise, and sleep habits. Work and work Astronomer. Sunscreen use. Safety issues such as seatbelt and bike helmet use. Physical exam Your health care provider will check your: Height and weight. These may be used to calculate your BMI (body mass index). BMI is a measurement that tells if you are at a healthy weight. Waist circumference. This measures the distance around your waistline. This measurement also tells if you are at a healthy weight and may help predict your risk of certain diseases, such as type 2 diabetes and high blood pressure. Heart rate and blood pressure. Body temperature. Skin for abnormal spots. What immunizations do I need?  Vaccines are usually given at various ages, according to a schedule. Your health care provider will recommend vaccines for you based on your age, medical history, and lifestyle or other factors, such as travel or where you work. What tests do I need? Screening Your health care provider may recommend screening tests for certain conditions. This may include: Lipid and cholesterol levels. Diabetes screening. This is done by checking your blood sugar (glucose) after you have not eaten for a while (fasting). Hepatitis C test. Hepatitis B test. HIV (human immunodeficiency virus) test. STI (sexually transmitted infection) testing, if you are at risk. Lung cancer  screening. Colorectal cancer screening. Prostate cancer screening. Abdominal aortic aneurysm (AAA) screening. You may need this if you are a current or former smoker. Talk with your health care provider about your test results, treatment options, and if necessary, the need for more tests. Follow these instructions at home: Eating and drinking  Eat a diet that includes fresh fruits  and vegetables, whole grains, lean protein, and low-fat dairy products. Limit your intake of foods with high amounts of sugar, saturated fats, and salt. Take vitamin and mineral supplements as recommended by your health care provider. Do not drink alcohol if your health care provider tells you not to drink. If you drink alcohol: Limit how much you have to 0-2 drinks a day. Know how much alcohol is in your drink. In the [place] one drink equals one 12 oz bottle of beer (355 mL), one 5 oz glass of wine (148 mL), or one 1 oz glass of hard liquor (44 mL). Lifestyle Brush your teeth every morning and night with fluoride toothpaste. Floss one time each day. Exercise for at least 30 minutes 5 or more days each week. Do not use any products that contain nicotine or tobacco. These products include cigarettes, chewing tobacco, and vaping devices, such as e-cigarettes. If you need help quitting, ask your health care provider. Do not use drugs. If you are sexually active, practice safe sex. Use a condom or other form of protection to prevent STIs. Take aspirin only as told by your health care provider. Make sure that you understand how much to take and what form to take. Work with your health care provider to find out whether it is safe and beneficial for you to take aspirin daily. Ask your health care provider if you need to take a cholesterol-lowering medicine (statin). Find healthy ways to manage stress, such as: Meditation, yoga, or listening to music. Journaling. Talking to a trusted person. Spending time with friends and family. Safety Always wear your seat belt while driving or riding in a vehicle. Do not drive: If you have been drinking alcohol. Do not ride with someone who has been drinking. When you are tired or distracted. While texting. If you have been using any mind-altering substances or drugs. Wear a helmet and other protective equipment during sports activities. If you have firearms in  your house, make sure you follow all gun safety procedures. Minimize exposure to UV radiation to reduce your risk of skin cancer. What's next? Visit your health care provider once a year for an annual wellness visit. Ask your health care provider how often you should have your eyes and teeth checked. Stay up to date on all vaccines. This information is not intended to replace advice given to you by your health care provider. Make sure you discuss any questions you have with your health care provider. Document Revised: 07/14/2020 Document Reviewed: 07/14/2020 Elsevier Patient Education  2024 [employer].

## 2022-11-15 NOTE — Assessment & Plan Note (Signed)
Recently saw pulmonology and restarted on trelegy. This seems to be working well.

## 2022-11-15 NOTE — Progress Notes (Signed)
Chief Complaint:  Jeffery Wood is a 67 y.o. male who presents today for his annual comprehensive physical exam.    Assessment/Plan:  Chronic Problems Addressed Today: Hyperglycemia Check A1c. He is on metformin 1000mg  twice daily and amaryl 2 mg daily.   Dyslipidemia Check lipids. He is on Lipitor 40 mg daily.   Hypertension associated with diabetes (HCC) Blood pressure at goal today on imdur 15 mg daily.   BPH (benign prostatic hyperplasia) Symptoms are stable on Flomax 0.4 mg daily. Check PSA.   Insomnia Stable on trazodone 25-50 mg nightly.   COPD mixed type [facility]) Recently saw pulmonology and restarted on trelegy. This seems to be working well.   Memory impairment Currently on aricept 5 mg. Tolerating well though still having some issues with short-term memory lapses.  We are checking labs today.  He was already been referred to neurology for this and will be seeing them in a few weeks.  Right shoulder pain Likely has underlying osteoarthritis.  Also some tenderness on resisted rotator cuff examination today.  We discussed referral to PT or sports medicine however declined for now.  We discussed home exercises and handout was given.  We also discussed cortisone shot which he can come back to have this done if he wishes.  He will let us know if not improving in the next few weeks.  Preventative Healthcare: Check labs.  Will be getting colonoscopy later this year.  Up-to-date on vaccines.  Patient Counseling(The following topics were reviewed and/or handout was given):  -Nutrition: Stressed importance of moderation in sodium/caffeine intake, saturated fat and cholesterol, caloric balance, sufficient intake of fresh fruits, vegetables, and fiber.  -Stressed the importance of regular exercise.   -Substance Abuse: Discussed cessation/primary prevention of tobacco, alcohol, or other drug use; driving or other dangerous activities under the influence; availability of treatment  for abuse.   -Injury prevention: Discussed safety belts, safety helmets, smoke detector, smoking near bedding or upholstery.   -Sexuality: Discussed sexually transmitted diseases, partner selection, use of condoms, avoidance of unintended pregnancy and contraceptive alternatives.   -Dental health: Discussed importance of regular tooth brushing, flossing, and dental visits.  -Health maintenance and immunizations reviewed. Please refer to Health maintenance section.  Return to care in 1 year for next preventative visit.     Subjective:  HPI:  He has no acute complaints today.   He was last seen here about 2 weeks ago for Emergency Department follow up for vertigo and chest discomfort. 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 He will be seeing them later this week.   He was also having some issues with chest discomfort and wheezing. HE followed back up with his pulmonologist and was restarted on trelegy. HE has had some improvement in symptoms since then.   He has been having intermittent right shoulder pain for the last several years. Comes and goes. No obvious injuries or precipitating events.   Lifestyle Diet: Balanced. Plenty of fruits and vegetables.  Exercise: Very active at home.      11/15/2022    7:57 AM  Depression screen PHQ 2/9  Decreased Interest 3  Down, Depressed, Hopeless 0  PHQ - 2 Score 3  Altered sleeping 3  Tired, decreased energy 0  Change in appetite 0  Feeling bad or failure about yourself  0  Trouble concentrating 0  Moving slowly or fidgety/restless 0  Suicidal thoughts 0  PHQ-9 Score 6  Difficult doing work/chores Not difficult at all    Health Maintenance Due  Topic Date Due   FOOT EXAM  Never done   Diabetic kidney evaluation - Urine ACR  Never done   OPHTHALMOLOGY EXAM  03/29/2022   HEMOGLOBIN A1C  11/14/2022   Medicare Annual Wellness (AWV)  01/04/2023     ROS: Per HPI,  otherwise a complete review of systems was negative.   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 chemo 05/2017, in remission-chest, but all over   Memory loss    mild - r/t chemo, 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   Neuromuscular disorder (HCC)    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   Sleep apnea    does not use anything for OSA   Patient Active Problem List   Diagnosis Date Noted   Allergic rhinitis 05/15/2022   Memory impairment 01/06/2022   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   Right shoulder pain 08/29/2021   CAD (coronary artery disease) 06/10/2021   Onychomycosis 06/09/2021   Microscopic hematuria 04/04/2021   Dyslipidemia 11/08/2020   Hallux valgus 09/07/2020   Insomnia 12/16/2019   Spondylolisthesis of lumbar region 01/17/2018   Hypertension associated with diabetes (HCC) 11/29/2017   Asthma 11/29/2017   BPH (benign prostatic hyperplasia) 11/29/2017   Chronic low back pain 11/29/2017   Osteoarthritis 11/29/2017   Psoriasis 11/29/2017   GERD (gastroesophageal reflux disease) 11/29/2017   Neuropathic pain 04/14/2017   Constipation 03/04/2017   Lymphoma, 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   NHL (non-Hodgkin's lymphoma) (HCC) 02/07/2017   OSA (obstructive sleep apnea) 07/22/2013   Hyperglycemia 12/07/2010   Past Surgical History:  Procedure Laterality Date   BACK SURGERY     L4-L5 bracket and screws    BICEPS TENDON REPAIR Left    CARPAL TUNNEL RELEASE     COLONOSCOPY     CORONARY BALLOON ANGIOPLASTY N/A 06/10/2021   Procedure: CORONARY BALLOON ANGIOPLASTY;  Surgeon: Corky Crafts, MD;  Location: [facility] INVASIVE CV LAB;  Service: Cardiovascular;  Laterality: N/A;   CORONARY STENT INTERVENTION N/A 06/10/2021   Procedure: CORONARY STENT INTERVENTION;  Surgeon: Corky Crafts, MD;  Location: [facility] INVASIVE CV LAB;  Service: Cardiovascular;  Laterality: N/A;   CORONARY ULTRASOUND/IVUS N/A 06/10/2021   Procedure: Intravascular Ultrasound/IVUS;  Surgeon: Corky Crafts, MD;  Location: [facility] INVASIVE CV LAB;  Service: Cardiovascular;  Laterality: N/A;   FINGER SURGERY     KNEE ARTHROSCOPY     ACL tear   LEFT HEART CATH AND CORONARY ANGIOGRAPHY N/A 06/10/2021   Procedure: LEFT HEART CATH AND CORONARY ANGIOGRAPHY;  Surgeon: Corky Crafts, MD;  Location: [facility] INVASIVE CV LAB;  Service: Cardiovascular;  Laterality: N/A;   LEFT HEART  CATHETERIZATION WITH CORONARY ANGIOGRAM N/A 12/07/2010   Procedure: LEFT HEART CATHETERIZATION WITH CORONARY ANGIOGRAM;  Surgeon: Marykay Lex, MD;  Location[facility] CATH LAB;  Service: Cardiovascular;  Laterality: N/A;   LUMBAR LAMINECTOMY/DECOMPRESSION MICRODISCECTOMY Right 12/09/2020   Procedure: MICRODISCECTOMY, RIGHT LUMBAR TWO-THREE, LUMBAR THREE-FOUR;  Surgeon: Tressie Stalker, MD;  Location: [facility] OR;  Service: Neurosurgery;  Laterality: Right;   PILONIDAL CYST EXCISION     PORT-A-CATH REMOVAL N/A 11/09/2017   Procedure: REMOVAL PORT-A-CATH;  Surgeon: Luretha Murphy, MD;  Location: [facility];  Service: General;  Laterality: N/A;  local   PORTACATH PLACEMENT Left 02/27/2017   Procedure: INSERTION PORT-A-CATH;  Surgeon: Luretha Murphy, MD;  Location: [facility] ORS;  Service: General;  Laterality: Left;   UPPER GASTROINTESTINAL ENDOSCOPY  05/16/2006   VIDEO BRONCHOSCOPY WITH ENDOBRONCHIAL ULTRASOUND N/A 01/24/2017   Procedure: VIDEO BRONCHOSCOPY  WITH ENDOBRONCHIAL ULTRASOUND with TRANSBRONCHIAL BIOPSY;  Surgeon: Delight Ovens, MD;  Location: [facility] OR;  Service: Thoracic;  Laterality: N/A;   VIDEO MEDIASTINOSCOPY N/A 02/05/2017   Procedure: VIDEO MEDIASTINOSCOPY;  Surgeon: Delight Ovens, MD;  Location: [facility] OR;  Service: Thoracic;  Laterality: N/A;    Family History  Problem Relation Age of Onset   Emphysema Mother    Asthma Mother    Heart disease Father     Medications- reviewed and updated Current Outpatient Medications  Medication Sig Dispense Refill   albuterol (PROVENTIL) (2.5 MG/3ML) 0.083% nebulizer solution Take 2.5 mg by nebulization every 4 (four) hours as needed for wheezing or shortness of breath.     ALBUTEROL SULFATE HFA IN Inhale into the lungs. AS DIRECTED PRN     atorvastatin (LIPITOR) 40 MG tablet Take 1 tablet (40 mg total) by mouth daily. 90 tablet 2   augmented betamethasone dipropionate (DIPROLENE-AF) 0.05 % cream Apply 1 Application topically to affected areas on skin as directed. 50 g PRN   azelastine (ASTELIN) 0.1 % nasal spray Place 2 sprays into both nostrils 2 (two) times daily. 30 mL 12   celecoxib (CELEBREX) 200 MG capsule Take 1 capsule (200 mg total) by mouth 2 (two) times daily as needed. 60 capsule 5   ciclopirox (PENLAC) 8 % solution Apply topically at bedtime. Apply over nail and surrounding skin. Apply daily over previous coat. After seven (7) days, may remove with alcohol and continue cycle. 6.6 mL 2   clopidogrel (PLAVIX) 75 MG tablet Take 1 tablet (75 mg total) by mouth daily with breakfast. 90 tablet 1   cyclobenzaprine (FLEXERIL) 10 MG tablet Take 1 tablet (10 mg total) by mouth 3 (three) times daily as needed for muscle spasms. 30 tablet 1   diazepam (VALIUM) 5 MG tablet Take 1 tablet (5 mg total) by mouth every 12 (twelve) hours as needed (dizziness). 10 tablet 0   donepezil (ARICEPT) 5 MG tablet Take 1 tablet (5 mg total) by mouth daily. 30 tablet 1   DULoxetine (CYMBALTA) 30 MG  capsule Take 1 capsule (30 mg total) by mouth at bedtime. 90 capsule 1   Fluticasone-Umeclidin-Vilant (TRELEGY ELLIPTA) 100-62.5-25 MCG/ACT AEPB Inhale 1 puff then rinse mouth, once daily 60 each 12   gabapentin (NEURONTIN) 300 MG capsule Take 4 capsules (1,200 mg total) by mouth 3 (three) times daily. 1080 capsule 3   glimepiride (AMARYL) 4 MG tablet Take 0.5 tablets (2 mg total) by mouth daily before largest meal of the day. 60 tablet 1   isosorbide mononitrate (IMDUR) 30 MG 24 hr tablet Take 1/2 tablet (15 mg total)  by mouth daily. 90 tablet 3   levalbuterol (XOPENEX HFA) 45 MCG/ACT inhaler Inhale 2 puffs into the lungs daily as needed for wheezing or shortness of breath. 15 g 1   loratadine (CLARITIN) 10 MG tablet Take 10 mg by mouth daily.     metFORMIN (GLUCOPHAGE) 1000 MG tablet Take 1 tablet (1,000 mg total) by mouth 2 (two) times daily. 180 tablet 0   metoCLOPramide (REGLAN) 10 MG tablet Take 1 tablet (10 mg total) by mouth every 6 (six) hours as needed for nausea. 30 tablet 0   Multiple Vitamin (MULTIVITAMIN) capsule Take 1 capsule by mouth daily.     OVER THE COUNTER MEDICATION Take 1 tablet by mouth daily. [employer] Fuel     oxyCODONE-acetaminophen (PERCOCET/ROXICET) 5-325 MG tablet Take 1 - 2 tablets by mouth every 6 hours as needed for severe pain. 20 tablet 0   pantoprazole (PROTONIX) 40 MG tablet Take 1 tablet (40 mg total) by mouth daily. 90 tablet 1   tamsulosin (FLOMAX) 0.4 MG CAPS capsule Take 1 capsule (0.4 mg) by mouth daily. 90 capsule 1   traMADol (ULTRAM) 50 MG tablet Take 1 tablet (50 mg total) by mouth every 8 (eight) hours as needed. 50 tablet 1   traZODone (DESYREL) 50 MG tablet Take 1/2 - 1 tablet (25 - 50 mg) by mouth at bedtime as needed for sleep. 30 tablet 3   nitroGLYCERIN (NITROSTAT) 0.4 MG SL tablet Place 1 tablet (0.4 mg total) under the tongue every 5 (five) minutes as needed for chest pain. If no relief after 3 doses call 911. (Patient not taking: Reported on  11/15/2022) 25 tablet 3   No current facility-administered medications for this visit.    Allergies-reviewed and updated No Known Allergies  Social History   Socioeconomic History   Marital status: Married    Spouse name: Not on file   Number of children: 2   Years of education: Not on file   Highest education level: Some college, no degree  Occupational History   Occupation: retired  Tobacco Use   Smoking status: Former    Current packs/day: 0.00    Average packs/day: 3.0 packs/day for 20.0 years (60.0 ttl pk-yrs)    Types: Cigarettes    Start date: 01/31/1968    Quit date: 01/31/1988    Years since quitting: 34.8   Smokeless tobacco: Never  Vaping Use   Vaping status: Never Used  Substance and Sexual Activity   Alcohol use: No   Drug use: No   Sexual activity: Yes    Partners: Female    Birth control/protection: None  Other Topics Concern   Not on file  Social History Narrative   1 grand and 1 due 08/2021   Social Determinants of Health   Financial Resource Strain: Low Risk  (05/11/2022)   Overall Financial Resource Strain (CARDIA)    Difficulty of Paying Living Expenses: Not hard at all  Food Insecurity: No Food Insecurity (05/11/2022)   Hunger Vital Sign    Worried About Running Out of Food in the Last Year: Never true    Ran Out of Food in the Last Year: Never true  Transportation Needs: No Transportation Needs (05/11/2022)   PRAPARE - Administrator, Civil Service (Medical): No    Lack of Transportation (Non-Medical): No  Physical Activity: Inactive (01/03/2022)   Exercise Vital Sign    Days of Exercise per Week: 0 days    Minutes of Exercise per Session: 0  min  Stress: Patient Declined (05/11/2022)   [employer] of Occupational Health - Occupational Stress Questionnaire    Feeling of Stress : Patient declined  Social Connections: Unknown (05/11/2022)   Social Connection and Isolation Panel [NHANES]    Frequency of Communication with Friends  and Family: More than three times a week    Frequency of Social Gatherings with Friends and Family: Patient declined    Attends Religious Services: Patient declined    Database administrator or Organizations: Patient declined    Attends Banker Meetings: Never    Marital Status: Married        Objective:  Physical Exam: BP 110/73   Pulse 78   Temp (!) 97.3 F (36.3 C) (Temporal)   Ht 6\' 3"  (1.905 m)   Wt 244 lb 6.4 oz (110.9 kg)   SpO2 97%   BMI 30.55 kg/m   Body mass index is 30.55 kg/m. Wt Readings from Last 3 Encounters:  11/15/22 244 lb 6.4 oz (110.9 kg)  11/07/22 246 lb (111.6 kg)  11/06/22 240 lb (108.9 kg)   Gen: NAD, resting comfortably HEENT: TMs normal bilaterally. OP clear. No thyromegaly noted.  CV: RRR with no murmurs appreciated Pulm: NWOB, CTAB with no crackles, wheezes, or rhonchi GI: Normal bowel sounds present. Soft, Nontender, Nondistended. MSK: no edema, cyanosis, or clubbing noted - Right Shoulder: No deformities.  Full range of motion.  Pain along proximal biceps tendon.  Pain elicited with resisted supraspinatus testing and resisted external rotation.  Neurovascular intact distally. Skin: warm, dry Neuro: CN2-12 grossly intact. Strength 5/5 in upper and lower extremities. Reflexes symmetric and intact bilaterally.  Psych: Normal affect and thought content     Jahel Wavra M. Jimmey Ralph, MD 11/15/2022 8:24 AM

## 2022-11-15 NOTE — Assessment & Plan Note (Signed)
Currently on aricept 5 mg. Tolerating well though still having some issues with short-term memory lapses.  We are checking labs today.  He was already been referred to neurology for this and will be seeing them in a few weeks.

## 2022-11-15 NOTE — Assessment & Plan Note (Signed)
Symptoms are stable on Flomax 0.4 mg daily. Check PSA.

## 2022-11-15 NOTE — Assessment & Plan Note (Signed)
Likely has underlying osteoarthritis.  Also some tenderness on resisted rotator cuff examination today.  We discussed referral to PT or sports medicine however declined for now.  We discussed home exercises and handout was given.  We also discussed cortisone shot which he can come back to have this done if he wishes.  He will let us know if not improving in the next few weeks.

## 2022-11-15 NOTE — Assessment & Plan Note (Signed)
Check lipids.  He is on Lipitor 40 mg daily.

## 2022-11-15 NOTE — Assessment & Plan Note (Signed)
Blood pressure at goal today on imdur 15 mg daily.

## 2022-11-15 NOTE — Assessment & Plan Note (Signed)
Check A1c. He is on metformin 1000mg  twice daily and amaryl 2 mg daily.

## 2022-11-17 ENCOUNTER — Ambulatory Visit: Payer: Medicare PPO | Attending: Family Medicine | Admitting: Physical Therapy

## 2022-11-17 ENCOUNTER — Other Ambulatory Visit: Payer: Self-pay

## 2022-11-17 DIAGNOSIS — R42 Dizziness and giddiness: Secondary | ICD-10-CM | POA: Insufficient documentation

## 2022-11-17 NOTE — Therapy (Signed)
OUTPATIENT PHYSICAL THERAPY VESTIBULAR EVALUATION   Patient Name: Jeffery Wood MRN: [id number] DOB:1955-02-14, 67 y.o., male Today's Date: 11/17/2022  END OF SESSION:  PT End of Session - 11/17/22 0804     Visit Number 1    Number of Visits 1    Authorization Type [employer]    PT Start Time 0805    PT Stop Time 0845    PT Time Calculation (min) 40 min             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    CAD (coronary artery disease) 2023   5v   Cancer (HCC)    basal cell of right temple; carcinoids   Complication of anesthesia    hard to wake up after bronchoscopy   Diabetes (HCC)    type 2   Dyspnea    GERD (gastroesophageal reflux disease)    Gout    resolved, no current problem   Headache    History of hiatal hernia 05/16/2006   small noted on EGD   Hypertension    hx   Lung mass    Lymphoma in remission    last chemo 05/2017, in remission-chest, but all over   Memory loss    mild - r/t chemo, 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   Neuromuscular disorder (HCC)    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   Sleep apnea    does not use anything for OSA   Past Surgical History:  Procedure Laterality Date   BACK SURGERY     L4-L5 bracket and screws   BICEPS TENDON REPAIR Left    CARPAL TUNNEL RELEASE     COLONOSCOPY     CORONARY BALLOON ANGIOPLASTY N/A 06/10/2021   Procedure: CORONARY BALLOON ANGIOPLASTY;  Surgeon: Corky Crafts, MD;  Location: [facility] INVASIVE CV LAB;  Service: Cardiovascular;  Laterality: N/A;   CORONARY STENT INTERVENTION N/A 06/10/2021   Procedure: CORONARY STENT INTERVENTION;  Surgeon: Corky Crafts, MD;  Location: [facility] INVASIVE CV LAB;  Service: Cardiovascular;  Laterality: N/A;   CORONARY ULTRASOUND/IVUS N/A 06/10/2021   Procedure: Intravascular Ultrasound/IVUS;  Surgeon: Corky Crafts, MD;  Location: [facility] INVASIVE CV LAB;  Service:  Cardiovascular;  Laterality: N/A;   FINGER SURGERY     KNEE ARTHROSCOPY     ACL tear   LEFT HEART CATH AND CORONARY ANGIOGRAPHY N/A 06/10/2021   Procedure: LEFT HEART CATH AND CORONARY ANGIOGRAPHY;  Surgeon: Corky Crafts, MD;  Location: [facility] INVASIVE CV LAB;  Service: Cardiovascular;  Laterality: N/A;   LEFT HEART CATHETERIZATION WITH CORONARY ANGIOGRAM N/A 12/07/2010   Procedure: LEFT HEART CATHETERIZATION WITH CORONARY ANGIOGRAM;  Surgeon: Marykay Lex, MD;  Location: [facility] CATH LAB;  Service: Cardiovascular;  Laterality: N/A;   LUMBAR LAMINECTOMY/DECOMPRESSION MICRODISCECTOMY Right 12/09/2020   Procedure: MICRODISCECTOMY, RIGHT LUMBAR TWO-THREE, LUMBAR THREE-FOUR;  Surgeon: Tressie Stalker, MD;  Location: [facility] OR;  Service: Neurosurgery;  Laterality: Right;   PILONIDAL CYST EXCISION     PORT-A-CATH REMOVAL N/A 11/09/2017   Procedure: REMOVAL PORT-A-CATH;  Surgeon: Luretha Murphy, MD;  Location: [facility];  Service: General;  Laterality: N/A;  local   PORTACATH PLACEMENT Left 02/27/2017   Procedure: INSERTION PORT-A-CATH;  Surgeon: Luretha Murphy, MD;  Location: [facility] ORS;  Service: General;  Laterality: Left;   UPPER GASTROINTESTINAL ENDOSCOPY  05/16/2006   VIDEO BRONCHOSCOPY WITH ENDOBRONCHIAL  ULTRASOUND N/A 01/24/2017   Procedure: VIDEO BRONCHOSCOPY WITH ENDOBRONCHIAL ULTRASOUND with TRANSBRONCHIAL BIOPSY;  Surgeon: Delight Ovens, MD;  Location: [facility] OR;  Service: Thoracic;  Laterality: N/A;   VIDEO MEDIASTINOSCOPY N/A 02/05/2017   Procedure: VIDEO MEDIASTINOSCOPY;  Surgeon: Delight Ovens, MD;  Location: [facility] OR;  Service: Thoracic;  Laterality: N/A;   Patient Active Problem List   Diagnosis Date Noted   Allergic rhinitis 05/15/2022   Memory impairment 01/06/2022   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   Right shoulder pain 08/29/2021   CAD (coronary artery disease) 06/10/2021   Onychomycosis 06/09/2021   Microscopic hematuria 04/04/2021   Dyslipidemia  11/08/2020   Hallux valgus 09/07/2020   Insomnia 12/16/2019   Spondylolisthesis of lumbar region 01/17/2018   Hypertension associated with diabetes (HCC) 11/29/2017   Asthma 11/29/2017   BPH (benign prostatic hyperplasia) 11/29/2017   Chronic low back pain 11/29/2017   Osteoarthritis 11/29/2017   Psoriasis 11/29/2017   GERD (gastroesophageal reflux disease) 11/29/2017   Neuropathic pain 04/14/2017   Constipation 03/04/2017   Lymphoma, 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  ONSET DATE: ~1 week ago was the worst  Rationale for Evaluation and Treatment: Rehabilitation  SUBJECTIVE:   SUBJECTIVE STATEMENT: Pt reports he has been having dizzy spells when he lays down. Pt states it happened twice at the shop but the worst one occurred ~1 week ago. He states at that time he was not able to get up and get it to go away and the ambulance had to be called. He went to the ED but there were no findings on MRI and heart evaluation was (-). Pt reports he had a feeling yesterday that it was about to happen but it didn't. Pt states it's been happening for a long time and it comes and goes. Feels it most when bending over and laying back.  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 No  PRECAUTIONS: None  RED FLAGS: None   WEIGHT BEARING RESTRICTIONS: No  FALLS: Has patient fallen in last 6 months? No  LIVING ENVIRONMENT: Lives with: lives with their spouse Lives in: House/apartment  PLOF: Independent  PATIENT GOALS: Find out what the dizziness is and fix it  OBJECTIVE:  Note: Objective measures were  completed at Evaluation unless otherwise noted.  DIAGNOSTIC FINDINGS: MRI 10/31/22 IMPRESSION: 1. No acute intracranial abnormality. 2. 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 3. Findings of chronic small vessel ischemia and volume loss.  COGNITION: Overall cognitive status: Within functional limits for tasks assessed and "chemo brain"   SENSATION: Decreased bilat from chemo   Cervical ROM:  WNL, mildly limited in extension  Active A/PROM (deg) eval  Flexion   Extension   Right lateral flexion   Left lateral flexion   Right rotation   Left rotation   (Blank rows = not tested)  STRENGTH: Did not assess   BED MOBILITY:  Independent  TRANSFERS: Independent  GAIT: Distance walked: Into clinic Assistive device utilized: None Level of assistance: Complete Independence Comments: Normal reciprocal pattern  FUNCTIONAL TESTS:  Did not assess  PATIENT SURVEYS:  FOTO 59; predicted 64  VESTIBULAR ASSESSMENT:  GENERAL OBSERVATION: WNL   SYMPTOM BEHAVIOR:  Subjective history: Pt has had inner ear issues and he had to go to [facility] but he states he is not sure what it was. 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  Non-Vestibular symptoms: headaches, tinnitus, and nausea/vomiting  Type of dizziness: Spinning/Vertigo  Frequency: "it varies"  Duration: "I don't know..last time it was for a while and I couldn't get up from laying down" but states other instances are normally "pretty brief"  Aggravating factors: Induced by position change: lying supine and Induced by motion: looking up at the ceiling and bending down to the ground  Relieving factors: rest and slow movements  Progression of symptoms: better  OCULOMOTOR EXAM:  Ocular Alignment: normal  Ocular ROM: No Limitations  Spontaneous Nystagmus: absent  Gaze-Induced Nystagmus: absent  Smooth Pursuits: intact  Saccades: intact  FRENZEL - FIXATION SUPRESSED:  Did  not assess  VESTIBULAR - OCULAR REFLEX:   Slow VOR: Normal  Fast VOR: Normal   POSITIONAL TESTING: Right Dix-Hallpike: no nystagmus Left Dix-Hallpike: no nystagmus Right Roll Test: no nystagmus Left Roll Test: no nystagmus Right Sidelying: no nystagmus Left Sidelying: no nystagmus  MOTION SENSITIVITY:  Motion Sensitivity Quotient Intensity: 0 = none, 1 = Lightheaded, 2 = Mild, 3 = Moderate, 4 = Severe, 5 = Vomiting  Intensity  1. Sitting to supine 0  2. Supine to L side 0  3. Supine to R side 0  4. Supine to sitting 0  5. L Hallpike-Dix 0  6. Up from L  0  7. R Hallpike-Dix 0  8. Up from R  0  9. Sitting, head tipped to L knee 0  10. Head up from L knee 0  11. Sitting, head tipped to R knee 0  12. Head up from R knee 0  13. Sitting head turns x5 0  14.Sitting head nods x5 0  15. In stance, 180 turn to L    16. In stance, 180 turn to R     OTHOSTATICS: not done  FUNCTIONAL GAIT: Did not assess   VESTIBULAR TREATMENT:                                                                                                   DATE: 11/17/22 Education on how to check at home and how to perform self canalith repositioning   PATIENT EDUCATION: Education details: Exam findings, BPPV education Person educated: Patient Education method: Explanation, Demonstration, and Handouts Education comprehension: verbalized understanding, returned demonstration, and needs further education  HOME EXERCISE PROGRAM: Self canalith repositioning  GOALS: Goals reviewed with patient? Yes  SHORT TERM GOALS: Target date: 11/17/2022  Pt will verbalize understanding of BPPV, how to check and how to treat at home with wife assist Baseline: Goal status: MET   ASSESSMENT:  CLINICAL IMPRESSION: Patient is a 67 y.o. M who was seen today for physical therapy evaluation and treatment for dizziness. Pt reports he had the worst bout of dizziness ~1 week ago; however, H&P shows he  was in the ED  10/31/22. Has not noted any instances of dizziness but near dizziness since. Assessment was not significant for any BPPV during canalith testing today. No overt abnormalities found during vestibular screening. Pt notes baseline bilat LE weakness and decreased balance from his chemo but otherwise does not demonstrate any PT needs. Provided pt education on how to check and treat BPPV at home. Pt verbalizes understanding.   OBJECTIVE IMPAIRMENTS: dizziness.   ACTIVITY LIMITATIONS: transfers and bending  PARTICIPATION LIMITATIONS: occupation  PERSONAL FACTORS: Past/current experiences and Time since onset of injury/illness/exacerbation are also affecting patient's functional outcome.   REHAB POTENTIAL: Good  CLINICAL DECISION MAKING: Stable/uncomplicated  EVALUATION COMPLEXITY: Low   PLAN:  PT FREQUENCY: one time visit  PT DURATION: n/a  PLANNED INTERVENTIONS: 97164- PT Re-evaluation, 97110-Therapeutic exercises, 97530- Therapeutic activity, [id number]- Neuromuscular re-education, 97535- Self Care, 16109- Manual therapy, and 95992- Canalith repositioning  PLAN FOR NEXT SESSION: N/a. One time visit for education only   [facility] April [place] [address] [place], PT 11/17/2022, 8:49 AM

## 2022-11-18 ENCOUNTER — Other Ambulatory Visit (HOSPITAL_COMMUNITY): Payer: Self-pay

## 2022-11-20 NOTE — Progress Notes (Signed)
His A1c is borderline elevated but stable compared to previous values.  The rest of his labs are all stable.  Do not need to make any changes to treatment plan.  He should continue to work on diet and exercise and we can recheck everything in a year or so.

## 2022-11-21 ENCOUNTER — Other Ambulatory Visit: Payer: Self-pay

## 2022-11-21 ENCOUNTER — Other Ambulatory Visit: Payer: Self-pay | Admitting: Family Medicine

## 2022-11-21 MED ORDER — PANTOPRAZOLE SODIUM 40 MG PO TBEC
40.0000 mg | DELAYED_RELEASE_TABLET | Freq: Every day | ORAL | 1 refills | Status: DC
  Filled 2022-11-21: qty 90, 90d supply, fill #0
  Filled 2023-02-19: qty 90, 90d supply, fill #1

## 2022-11-23 ENCOUNTER — Other Ambulatory Visit: Payer: Self-pay

## 2022-11-24 ENCOUNTER — Encounter: Payer: Self-pay | Admitting: Family Medicine

## 2022-11-24 ENCOUNTER — Encounter: Payer: Self-pay | Admitting: Internal Medicine

## 2022-11-24 NOTE — Telephone Encounter (Signed)
Ok to send in zpack but he needs appointment if not improving.  Jeffery Wood. Jimmey Ralph, MD 11/24/2022 1:08 PM

## 2022-11-24 NOTE — Assessment & Plan Note (Signed)
He is going to try Trelegy again for respiratory symptom relief and wait to discuss his memory concerns with neurology next month.

## 2022-11-24 NOTE — Assessment & Plan Note (Signed)
Concerns of not been a big issue with Trelegy in my experience.  I suggested that he bring this issue specifically up when he sees neurology next month.

## 2022-11-24 NOTE — Telephone Encounter (Signed)
Would you like pt to schedule an appt.? Please advise

## 2022-11-27 ENCOUNTER — Other Ambulatory Visit: Payer: Self-pay | Admitting: Family Medicine

## 2022-11-27 ENCOUNTER — Other Ambulatory Visit: Payer: Self-pay

## 2022-11-27 ENCOUNTER — Other Ambulatory Visit: Payer: Self-pay | Admitting: *Deleted

## 2022-11-27 MED ORDER — AZITHROMYCIN 250 MG PO TABS
ORAL_TABLET | ORAL | 0 refills | Status: AC
  Filled 2022-11-27 – 2022-11-28 (×2): qty 6, 5d supply, fill #0

## 2022-11-28 ENCOUNTER — Other Ambulatory Visit (HOSPITAL_COMMUNITY): Payer: Self-pay

## 2022-11-28 ENCOUNTER — Other Ambulatory Visit: Payer: Self-pay

## 2022-11-28 MED ORDER — METFORMIN HCL 1000 MG PO TABS
1000.0000 mg | ORAL_TABLET | Freq: Two times a day (BID) | ORAL | 0 refills | Status: DC
  Filled 2022-11-28: qty 180, 90d supply, fill #0

## 2022-11-29 ENCOUNTER — Telehealth: Payer: Self-pay | Admitting: *Deleted

## 2022-11-29 DIAGNOSIS — Z860100 Personal history of colon polyps, unspecified: Secondary | ICD-10-CM | POA: Diagnosis not present

## 2022-11-29 DIAGNOSIS — R194 Change in bowel habit: Secondary | ICD-10-CM | POA: Diagnosis not present

## 2022-11-29 NOTE — Telephone Encounter (Signed)
Pt has been scheduled for tele pre op appt 12/20/22. Pt states he is going to be out of town the week after and prefers 12/20/22. Med rec and consent are done.      Patient Consent for Virtual Visit        Jeffery Wood has provided verbal consent on 11/29/2022 for a virtual visit (video or telephone).   CONSENT FOR VIRTUAL VISIT FOR:  Jeffery Wood  By participating in this virtual visit I agree to the following:  I hereby voluntarily request, consent and authorize [facility] and its employed or contracted physicians, physician assistants, nurse practitioners or other licensed health care professionals (the Practitioner), to provide me with telemedicine health care services (the "Services") as deemed necessary by the treating Practitioner. I acknowledge and consent to receive the Services by the Practitioner via telemedicine. I understand that the telemedicine visit will involve communicating with the Practitioner through live audiovisual communication technology and the disclosure of certain medical information by electronic transmission. I acknowledge that I have been given the opportunity to request an in-person assessment or other available alternative prior to the telemedicine visit and am voluntarily participating in the telemedicine visit.  I understand that I have the right to withhold or withdraw my consent to the use of telemedicine in the course of my care at any time, without affecting my right to future care or treatment, and that the Practitioner or I may terminate the telemedicine visit at any time. I understand that I have the right to inspect all information obtained and/or recorded in the course of the telemedicine visit and may receive copies of available information for a reasonable fee.  I understand that some of the potential risks of receiving the Services via telemedicine include:  Delay or interruption in medical evaluation due to technological equipment  failure or disruption; Information transmitted may not be sufficient (e.g. poor resolution of images) to allow for appropriate medical decision making by the Practitioner; and/or  In rare instances, security protocols could fail, causing a breach of personal health information.  Furthermore, I acknowledge that it is my responsibility to provide information about my medical history, conditions and care that is complete and accurate to the best of my ability. I acknowledge that Practitioner's advice, recommendations, and/or decision may be based on factors not within their control, such as incomplete or inaccurate data provided by me or distortions of diagnostic images or specimens that may result from electronic transmissions. I understand that the practice of medicine is not an exact science and that Practitioner makes no warranties or guarantees regarding treatment outcomes. I acknowledge that a copy of this consent can be made available to me via my patient portal [facility] MyChart), or I can request a printed copy by calling the office of [facility].    I understand that my insurance will be billed for this visit.   I have read or had this consent read to me. I understand the contents of this consent, which adequately explains the benefits and risks of the Services being provided via telemedicine.  I have been provided ample opportunity to ask questions regarding this consent and the Services and have had my questions answered to my satisfaction. I give my informed consent for the services to be provided through the use of telemedicine in my medical care

## 2022-11-29 NOTE — Telephone Encounter (Signed)
Pre-operative Risk Assessment    Patient Name: Jeffery Wood  DOB: Aug 26, 1955 MRN: [id number]  DATE OF LAST VISIT: 09/13/22 DR. ROSS DATE OF NEXT VISIT: NONE   Request for Surgical Clearance    Procedure:   COLONOSCOPY  Date of Surgery:  Clearance 01/11/23                                 Surgeon:  DR. MARC MAGOD Surgeon's Group or Practice Name:  [facility] Phone number:  [phone] Fax number:  [phone]   Type of Clearance Requested:   - Medical  - Pharmacy:  Hold Clopidogrel (Plavix) x 3 DAYS PRIOR   Type of Anesthesia:   PROPOFOL   Additional requests/questions:    Elpidio Anis   11/29/2022, 12:16 PM

## 2022-11-29 NOTE — Telephone Encounter (Signed)
Name: Jeffery Wood  DOB: Apr 07, 1955  MRN: [id number]  Primary Cardiologist: Dietrich Pates, MD   Preoperative team, please contact this patient and set up a phone call appointment for further preoperative risk assessment. Please obtain consent and complete medication review. Thank you for your help.  I confirm that guidance regarding antiplatelet and oral anticoagulation therapy has been completed and, if necessary, noted below.  Per protocol patient can hold Plavix 5 days prior to procedure and should restart postprocedure when surgically safe.  I also confirmed the patient resides in the state of [place]. As per [facility] Medical Board telemedicine laws, the patient must reside in the state in which the provider is licensed.   Napoleon Form, Leodis Rains, NP 11/29/2022, 12:26 PM [facility]

## 2022-11-29 NOTE — Telephone Encounter (Signed)
Pt has been scheduled for tele pre op appt 12/20/22. Pt states he is going to be out of town the week after and prefers 12/20/22. Med rec and consent are done.

## 2022-12-03 NOTE — Progress Notes (Signed)
Assessment/Plan:   Jeffery Wood is a very pleasant 67 y.o. 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 MoCA today is /30.***.  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  Patient is able to participate on his IADLs and continues to drive without significant difficulties.***    Memory Impairment of unclear etiology  Recommend CT angio of the head and neck for abnormal vertebral artery, vertigo. Neurocognitive testing to further evaluate cognitive concerns and determine other underlying cause of memory changes, including potential contribution from sleep, anxiety, attention, or depression Recommend referral to ENT for BPPV versus other ear abnormalities such as Mnire's disease versus labyrinthitis, etc. Check B12, TSH Continue to control mood as per PCP Continue Cymbalta, gabapentin for neuropathy as per PCP Recommend good control of cardiovascular risk factors.  Continue Plavix Folllow up in 1-2  months***  Subjective:   The patient is accompanied by ***  who supplements the history.   How long did patient have memory difficulties? For the last ***.  Reports some difficulty remembering new information, conversations and names.  Long-term memory is good. repeats oneself?  Endorsed Disoriented when walking into a room?  Patient denies except occasionally not remembering what patient came to the room for ***  Leaving objects in unusual places? Denies.   Wandering behavior?  denies .  Any personality changes?  Denies.    Any history of depression?:  Denies   Hallucinations or paranoia?  Denies   Seizures?  Denies    Any sleep changes?   Sleeps well with trazodone, denies vivid dreams, REM behavior or sleepwalking   Sleep apnea?  Endorsed, but is completely intolerant to CPAP for at least 10 years, does not want to try again. Any hygiene concerns?  Denies   Independent of bathing and dressing?  Endorsed  Does the patient needs help with medications? Patient is in charge *** Who is in charge of the finances? Patient is in charge   *** Any changes in appetite?  Denies ***   Patient have trouble swallowing? Denies.   Does the patient cook? ***Not much. Any kitchen accidents such as leaving the stove on? Denies.   Any history of headaches?   Denies.   Chronic pain ? Denies.   Ambulates with difficulty?  Denies. *** Recent falls or head injuries? Denies.   Vision changes? Denies.   Unilateral weakness, numbness or tingling? Denies.   Any tremors?   Denies.   Any anosmia?  Denies.   Any incontinence of urine? Denies.   Any bowel dysfunction? Denies.      Patient lives with  *** History of heavy alcohol intake? Denies.   History of heavy tobacco use? Denies.   Family history of dementia? Denies.  Does patient drive? Yes ***  Recent labs October 2024 showed normal lipid panel, B12 819, and TSH 1.36, A1c 5.8 normal chemistries, normal CBC 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    CAD (coronary artery disease) 2023   5v   Cancer (HCC)  basal cell of right temple; carcinoids   Complication of anesthesia    hard to wake up after bronchoscopy   Diabetes (HCC)    type 2   Dyspnea    GERD (gastroesophageal reflux disease)    Gout    resolved, no current problem   Headache    History of hiatal hernia 05/16/2006   small noted on EGD   Hypertension    hx   Lung mass    Lymphoma in remission    last chemo 05/2017, in  remission-chest, but all over   Memory loss    mild - r/t chemo, 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   Neuromuscular disorder (HCC)    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   Sleep apnea    does not use anything for OSA     Past Surgical History:  Procedure Laterality Date   BACK SURGERY     L4-L5 bracket and screws   BICEPS TENDON REPAIR Left    CARPAL TUNNEL RELEASE     COLONOSCOPY     CORONARY BALLOON ANGIOPLASTY N/A 06/10/2021   Procedure: CORONARY BALLOON ANGIOPLASTY;  Surgeon: Corky Crafts, MD;  Location: [facility] INVASIVE CV LAB;  Service: Cardiovascular;  Laterality: N/A;   CORONARY STENT INTERVENTION N/A 06/10/2021   Procedure: CORONARY STENT INTERVENTION;  Surgeon: Corky Crafts, MD;  Location: [facility] INVASIVE CV LAB;  Service: Cardiovascular;  Laterality: N/A;   CORONARY ULTRASOUND/IVUS N/A 06/10/2021   Procedure: Intravascular Ultrasound/IVUS;  Surgeon: Corky Crafts, MD;  Location: [facility] INVASIVE CV LAB;  Service: Cardiovascular;  Laterality: N/A;   FINGER SURGERY     KNEE ARTHROSCOPY     ACL tear   LEFT HEART CATH AND CORONARY ANGIOGRAPHY N/A 06/10/2021   Procedure: LEFT HEART CATH AND CORONARY ANGIOGRAPHY;  Surgeon: Corky Crafts, MD;  Location: [facility] INVASIVE CV LAB;  Service: Cardiovascular;  Laterality: N/A;   LEFT HEART CATHETERIZATION WITH CORONARY ANGIOGRAM N/A 12/07/2010   Procedure: LEFT HEART CATHETERIZATION WITH CORONARY ANGIOGRAM;  Surgeon: Marykay Lex, MD;  Location: [facility] CATH LAB;  Service: Cardiovascular;  Laterality: N/A;   LUMBAR LAMINECTOMY/DECOMPRESSION MICRODISCECTOMY Right 12/09/2020   Procedure: MICRODISCECTOMY, RIGHT LUMBAR TWO-THREE, LUMBAR THREE-FOUR;  Surgeon: Tressie Stalker, MD;  Location: [facility] OR;  Service: Neurosurgery;  Laterality: Right;   PILONIDAL CYST EXCISION     PORT-A-CATH REMOVAL N/A 11/09/2017   Procedure: REMOVAL PORT-A-CATH;  Surgeon: Luretha Murphy, MD;  Location: [facility];  Service: General;   Laterality: N/A;  local   PORTACATH PLACEMENT Left 02/27/2017   Procedure: INSERTION PORT-A-CATH;  Surgeon: Luretha Murphy, MD;  Location: [facility] ORS;  Service: General;  Laterality: Left;   UPPER GASTROINTESTINAL ENDOSCOPY  05/16/2006   VIDEO BRONCHOSCOPY WITH ENDOBRONCHIAL ULTRASOUND N/A 01/24/2017   Procedure: VIDEO BRONCHOSCOPY WITH ENDOBRONCHIAL ULTRASOUND with TRANSBRONCHIAL BIOPSY;  Surgeon: Delight Ovens, MD;  Location: [facility] OR;  Service: Thoracic;  Laterality: N/A;   VIDEO MEDIASTINOSCOPY N/A 02/05/2017   Procedure: VIDEO MEDIASTINOSCOPY;  Surgeon: Delight Ovens, MD;  Location: [facility] OR;  Service: Thoracic;  Laterality: N/A;     No Known Allergies  Current Outpatient Medications  Medication Instructions   albuterol (PROVENTIL) 2.5 mg, Nebulization, Every 4 hours PRN   ALBUTEROL SULFATE HFA IN Inhalation, AS DIRECTED PRN   atorvastatin (LIPITOR) 40 mg, Oral, Daily   augmented betamethasone dipropionate (DIPROLENE-AF) 0.05 % cream Apply 1 Application topically to affected areas on skin as directed.   azelastine (ASTELIN) 0.1 % nasal spray  2 sprays, Each Nare, 2 times daily   azithromycin (ZITHROMAX) 250 MG tablet Take 2 tablets on day 1, then 1 tablet daily on days 2 through 5   celecoxib (CELEBREX) 200 mg, Oral, 2 times daily PRN   ciclopirox (PENLAC) 8 % solution Topical, Daily at bedtime, Apply over nail and surrounding skin. Apply daily over previous coat. After seven (7) days, may remove with alcohol and continue cycle.   clopidogrel (PLAVIX) 75 mg, Oral, Daily with breakfast   cyclobenzaprine (FLEXERIL) 10 mg, Oral, 3 times daily PRN   diazepam (VALIUM) 5 mg, Oral, Every 12 hours PRN   donepezil (ARICEPT) 5 mg, Oral, Daily   DULoxetine (CYMBALTA) 30 mg, Oral, Daily at bedtime   Fluticasone-Umeclidin-Vilant (TRELEGY ELLIPTA) 100-62.5-25 MCG/ACT AEPB Inhale 1 puff then rinse mouth, once daily   gabapentin (NEURONTIN) 1,200 mg, Oral, 3 times daily   glimepiride (AMARYL) 4 MG  tablet Take 0.5 tablets (2 mg total) by mouth daily before largest meal of the day.   isosorbide mononitrate (IMDUR) 30 MG 24 hr tablet Take 1/2 tablet (15 mg total) by mouth daily.   levalbuterol (XOPENEX HFA) 45 MCG/ACT inhaler 2 puffs, Inhalation, Daily PRN   loratadine (CLARITIN) 10 mg, Oral, Daily,     metFORMIN (GLUCOPHAGE) 1,000 mg, Oral, 2 times daily   metoCLOPramide (REGLAN) 10 mg, Oral, Every 6 hours PRN   Multiple Vitamin (MULTIVITAMIN) capsule 1 capsule, Oral, Daily   nitroGLYCERIN (NITROSTAT) 0.4 mg, Sublingual, Every 5 min PRN, If no relief after 3 doses call 911.   OVER THE COUNTER MEDICATION 1 tablet, Oral, Daily, [employer]    oxyCODONE-acetaminophen (PERCOCET/ROXICET) 5-325 MG tablet Take 1 - 2 tablets by mouth every 6 hours as needed for severe pain.   pantoprazole (PROTONIX) 40 mg, Oral, Daily   tamsulosin (FLOMAX) 0.4 MG CAPS capsule Take 1 capsule (0.4 mg) by mouth daily.   traMADol (ULTRAM) 50 MG tablet Take 1 tablet (50 mg total) by mouth every 8 (eight) hours as needed.   traZODone (DESYREL) 50 MG tablet Take 1/2 - 1 tablet (25 - 50 mg) by mouth at bedtime as needed for sleep.     VITALS:  There were no vitals filed for this visit.    PHYSICAL EXAM   HEENT:  Normocephalic, atraumatic. The superficial temporal arteries are without ropiness or tenderness. Cardiovascular: Regular rate and rhythm. Lungs: Clear to auscultation bilaterally. Neck: There are no carotid bruits noted bilaterally.  NEUROLOGICAL:     No data to display              No data to display           Orientation:  Alert and oriented to person, place and time. No aphasia or dysarthria. Fund of knowledge is appropriate. Recent memory impaired and remote memory intact.  Attention and concentration are normal.  Able to name objects and repeat phrases. Delayed recall  /5 Cranial nerves: There is good facial symmetry. Extraocular muscles are intact and visual fields are full to  confrontational testing.***Speech is fluent and clear. No tongue deviation. Hearing is intact to conversational tone. Tone: Tone is good throughout. Sensation: Sensation is intact to light touch. Vibration is intact at the bilateral big toe.  Coordination: The patient has no difficulty with RAM's or FNF bilaterally. Normal finger to nose  Motor: Strength is 5/5 in the bilateral upper and lower extremities. There is no pronator drift. There are no fasciculations noted. DTR's: Deep tendon reflexes are 2/4 bilaterally.  Gait and Station: The patient is able to ambulate without difficulty The patient is able to heel toe walk . Gait is cautious and narrow. The patient is able to ambulate in a tandem fashion.       Thank you for allowing [place] the opportunity to participate in the care of this nice patient. Please do not hesitate to contact us for any questions or concerns.   Total time spent on today's visit was *** minutes dedicated to this patient today, preparing to see patient, examining the patient, ordering tests and/or medications and counseling the patient, documenting clinical information in the EHR or other health record, independently interpreting results and communicating results to the patient/family, discussing treatment and goals, answering patient's questions and coordinating care.  Cc:  Ardith Dark, MD  Marlowe Kays 12/03/2022 12:54 PM

## 2022-12-04 ENCOUNTER — Other Ambulatory Visit: Payer: Self-pay

## 2022-12-05 ENCOUNTER — Other Ambulatory Visit: Payer: Self-pay

## 2022-12-05 ENCOUNTER — Encounter: Payer: Self-pay | Admitting: Physician Assistant

## 2022-12-05 ENCOUNTER — Ambulatory Visit: Payer: Medicare PPO | Admitting: Physician Assistant

## 2022-12-05 VITALS — BP 127/79 | HR 84 | Resp 18 | Ht 75.0 in | Wt 240.0 lb

## 2022-12-05 DIAGNOSIS — R413 Other amnesia: Secondary | ICD-10-CM | POA: Diagnosis not present

## 2022-12-05 NOTE — Patient Instructions (Addendum)
It was a pleasure to see you today at our office.   Recommendations:  Follow up in 3 months   Recommend increasing donepezil 5 mg daily   Neuropsych evaluation  in the future   For assessment of decision of mental capacity and competency:  Call Dr. Erick Blinks, geriatric psychiatrist at [phone]  Whom to call: Memory  decline, memory medications: Call our office [phone]  For psychiatric meds, mood meds: Please have your primary care physician manage these medications.  If you have any severe symptoms of a stroke, or other severe issues such as confusion,severe chills or fever, etc call 911 or go to the ER as you may need to be evaluated further   [email or web address]   RECOMMENDATIONS FOR ALL PATIENTS WITH MEMORY PROBLEMS: 1. Continue to exercise (Recommend 30 minutes of walking everyday, or 3 hours every week) 2. Increase social interactions - continue going to [place] and enjoy social gatherings with friends and family 3. Eat healthy, avoid fried foods and eat more fruits and vegetables 4. Maintain adequate blood pressure, blood sugar, and blood cholesterol level. Reducing the risk of stroke and cardiovascular disease also helps promoting better memory. 5. Avoid stressful situations. Live a simple life and avoid aggravations. Organize your time and prepare for the next day in anticipation. 6. Sleep well, avoid any interruptions of sleep and avoid any distractions in the bedroom that may interfere with adequate sleep quality 7. Avoid sugar, avoid sweets as there is a strong link between excessive sugar intake, diabetes, and cognitive impairment We discussed the Mediterranean diet, which has been shown to help patients reduce the risk of progressive memory disorders and reduces cardiovascular risk. This includes eating fish, eat fruits and green leafy vegetables, nuts like almonds and hazelnuts, walnuts, and also use olive oil.  Avoid fast foods and fried foods as much as possible. Avoid sweets and sugar as sugar use has been linked to worsening of memory function.  There is always a concern of gradual progression of memory problems. If this is the case, then we may need to adjust level of care according to patient needs. Support, both to the patient and caregiver, should then be put into place.    The Alzheimer's Association is here all day, every day for people facing Alzheimer's disease through our free 24/7 Helpline: [phone]. The Helpline provides reliable information and support to all those who need assistance, such as individuals living with memory loss, Alzheimer's or other dementia, caregivers, health care professionals and the public.  Our highly trained and knowledgeable staff can help you with: Understanding memory loss, dementia and Alzheimer's  Medications and other treatment options  General information about aging and brain health  Skills to provide quality care and to find the best care from professionals  Legal, financial and living-arrangement decisions Our Helpline also features: Confidential care consultation provided by master's level clinicians who can help with decision-making support, crisis assistance and education on issues families face every day  Help in a caller's preferred language using our translation service that features more than 200 languages and dialects  Referrals to local community programs, services and ongoing support     FALL PRECAUTIONS: Be cautious when walking. Scan the area for obstacles that may increase the risk of trips and falls. When getting up in the mornings, sit up at the edge of the bed for a few minutes before getting out of bed. Consider elevating the bed at the head end to avoid drop of blood pressure  when getting up. Walk always in a well-lit room (use night lights in the walls). Avoid area rugs or power cords from appliances in the middle of the walkways.  Use a walker or a cane if necessary and consider physical therapy for balance exercise. Get your eyesight checked regularly.  FINANCIAL OVERSIGHT: Supervision, especially oversight when making financial decisions or transactions is also recommended.  HOME SAFETY: Consider the safety of the kitchen when operating appliances like stoves, microwave oven, and blender. Consider having supervision and share cooking responsibilities until no longer able to participate in those. Accidents with firearms and other hazards in the house should be identified and addressed as well.   ABILITY TO BE LEFT ALONE: If patient is unable to contact 911 operator, consider using LifeLine, or when the need is there, arrange for someone to stay with patients. Smoking is a fire hazard, consider supervision or cessation. Risk of wandering should be assessed by caregiver and if detected at any point, supervision and safe proof recommendations should be instituted.  MEDICATION SUPERVISION: Inability to self-administer medication needs to be constantly addressed. Implement a mechanism to ensure safe administration of the medications.   DRIVING: Regarding driving, in patients with progressive memory problems, driving will be impaired. We advise to have someone else do the driving if trouble finding directions or if minor accidents are reported. Independent driving assessment is available to determine safety of driving.   If you are interested in the driving assessment, you can contact the following:  [employer] in [place] [phone]   [facility] [phone] or [phone]      Mediterranean Diet A Mediterranean diet refers to food and lifestyle choices that are based on the traditions of countries located on the [employer]. This way of eating has been shown to help prevent certain conditions and improve outcomes for people who have chronic diseases,  like kidney disease and heart disease. What are tips for following this plan? Lifestyle  Cook and eat meals together with your family, when possible. Drink enough fluid to keep your urine clear or pale yellow. Be physically active every day. This includes: Aerobic exercise like running or swimming. Leisure activities like gardening, walking, or housework. Get 7-8 hours of sleep each night. If recommended by your health care provider, drink red wine in moderation. This means 1 glass a day for nonpregnant women and 2 glasses a day for men. A glass of wine equals 5 oz (150 mL). Reading food labels  Check the serving size of packaged foods. For foods such as rice and pasta, the serving size refers to the amount of cooked product, not dry. Check the total fat in packaged foods. Avoid foods that have saturated fat or trans fats. Check the ingredients list for added sugars, such as corn syrup. Shopping  At the grocery store, buy most of your food from the areas near the walls of the store. This includes: Fresh fruits and vegetables (produce). Grains, beans, nuts, and seeds. Some of these may be available in unpackaged forms or large amounts (in bulk). Fresh seafood. Poultry and eggs. Low-fat dairy products. Buy whole ingredients instead of prepackaged foods. Buy fresh fruits and vegetables in-season from local farmers markets. Buy frozen fruits and vegetables in resealable bags. If you do not have access to quality fresh seafood, buy precooked frozen shrimp or canned fish, such as tuna, salmon, or sardines. Buy small amounts of raw or cooked vegetables, salads, or olives from the  deli or salad bar at your store. Stock your pantry so you always have certain foods on hand, such as olive oil, canned tuna, canned tomatoes, rice, pasta, and beans. Cooking  Cook foods with extra-virgin olive oil instead of using butter or other vegetable oils. Have meat as a side dish, and have vegetables or grains  as your main dish. This means having meat in small portions or adding small amounts of meat to foods like pasta or stew. Use beans or vegetables instead of meat in common dishes like chili or lasagna. Experiment with different cooking methods. Try roasting or broiling vegetables instead of steaming or sauteing them. Add frozen vegetables to soups, stews, pasta, or rice. Add nuts or seeds for added healthy fat at each meal. You can add these to yogurt, salads, or vegetable dishes. Marinate fish or vegetables using olive oil, lemon juice, garlic, and fresh herbs. Meal planning  Plan to eat 1 vegetarian meal one day each week. Try to work up to 2 vegetarian meals, if possible. Eat seafood 2 or more times a week. Have healthy snacks readily available, such as: Vegetable sticks with hummus. Greek yogurt. Fruit and nut trail mix. Eat balanced meals throughout the week. This includes: Fruit: 2-3 servings a day Vegetables: 4-5 servings a day Low-fat dairy: 2 servings a day Fish, poultry, or lean meat: 1 serving a day Beans and legumes: 2 or more servings a week Nuts and seeds: 1-2 servings a day Whole grains: 6-8 servings a day Extra-virgin olive oil: 3-4 servings a day Limit red meat and sweets to only a few servings a month What are my food choices? Mediterranean diet Recommended Grains: Whole-grain pasta. Brown rice. Bulgar wheat. Polenta. Couscous. Whole-wheat bread. Orpah Cobb. Vegetables: Artichokes. Beets. Broccoli. Cabbage. Carrots. Eggplant. Green beans. Chard. Kale. Spinach. Onions. Leeks. Peas. Squash. Tomatoes. Peppers. Radishes. Fruits: Apples. Apricots. Avocado. Berries. Bananas. Cherries. Dates. Figs. Grapes. Lemons. Melon. Oranges. Peaches. Plums. Pomegranate. Meats and other protein foods: Beans. Almonds. Sunflower seeds. Pine nuts. Peanuts. Cod. Salmon. Scallops. Shrimp. Tuna. Tilapia. Clams. Oysters. Eggs. Dairy: Low-fat milk. Cheese. Greek yogurt. Beverages: Water.  Red wine. Herbal tea. Fats and oils: Extra virgin olive oil. Avocado oil. Grape seed oil. Sweets and desserts: [place] yogurt with honey. Baked apples. Poached pears. Trail mix. Seasoning and other foods: Basil. Cilantro. Coriander. Cumin. Mint. Parsley. Sage. Rosemary. Tarragon. Garlic. Oregano. Thyme. Pepper. Balsalmic vinegar. Tahini. Hummus. Tomato sauce. Olives. Mushrooms. Limit these Grains: Prepackaged pasta or rice dishes. Prepackaged cereal with added sugar. Vegetables: Deep fried potatoes (french fries). Fruits: Fruit canned in syrup. Meats and other protein foods: Beef. Pork. Lamb. Poultry with skin. Hot dogs. Tomasa Blase. Dairy: Ice cream. Sour cream. Whole milk. Beverages: Juice. Sugar-sweetened soft drinks. Beer. Liquor and spirits. Fats and oils: Butter. Canola oil. Vegetable oil. Beef fat (tallow). Lard. Sweets and desserts: Cookies. Cakes. Pies. Candy. Seasoning and other foods: Mayonnaise. Premade sauces and marinades. The items listed may not be a complete list. Talk with your dietitian about what dietary choices are right for you. Summary The Mediterranean diet includes both food and lifestyle choices. Eat a variety of fresh fruits and vegetables, beans, nuts, seeds, and whole grains. Limit the amount of red meat and sweets that you eat. Talk with your health care provider about whether it is safe for you to drink red wine in moderation. This means 1 glass a day for nonpregnant women and 2 glasses a day for men. A glass of wine equals 5 oz (150 mL). This  information is not intended to replace advice given to you by your health care provider. Make sure you discuss any questions you have with your health care provider. Document Released: 09/09/2015 Document Revised: 10/12/2015 Document Reviewed: 09/09/2015 Elsevier Interactive Patient Education  2017 [employer].

## 2022-12-05 NOTE — Addendum Note (Signed)
Addended by: Allean Found R on: 12/05/2022 12:53 PM   Modules accepted: Orders

## 2022-12-07 ENCOUNTER — Other Ambulatory Visit (HOSPITAL_COMMUNITY): Payer: Self-pay

## 2022-12-14 ENCOUNTER — Encounter: Payer: Self-pay | Admitting: Family Medicine

## 2022-12-15 ENCOUNTER — Telehealth: Payer: Medicare PPO | Admitting: Family Medicine

## 2022-12-15 ENCOUNTER — Other Ambulatory Visit (HOSPITAL_COMMUNITY): Payer: Self-pay

## 2022-12-15 ENCOUNTER — Ambulatory Visit: Payer: Medicare PPO | Admitting: Podiatry

## 2022-12-15 DIAGNOSIS — J40 Bronchitis, not specified as acute or chronic: Secondary | ICD-10-CM

## 2022-12-15 DIAGNOSIS — J019 Acute sinusitis, unspecified: Secondary | ICD-10-CM

## 2022-12-15 DIAGNOSIS — B9689 Other specified bacterial agents as the cause of diseases classified elsewhere: Secondary | ICD-10-CM

## 2022-12-15 MED ORDER — DOXYCYCLINE HYCLATE 100 MG PO TABS
100.0000 mg | ORAL_TABLET | Freq: Two times a day (BID) | ORAL | 0 refills | Status: AC
  Filled 2022-12-15: qty 20, 10d supply, fill #0

## 2022-12-15 NOTE — Progress Notes (Signed)
Virtual Visit Consent   Jeffery Wood, you are scheduled for a virtual visit with a [facility] provider today. Just as with appointments in the office, your consent must be obtained to participate. Your consent will be active for this visit and any virtual visit you may have with one of our providers in the next 365 days. If you have a MyChart account, a copy of this consent can be sent to you electronically.  As this is a virtual visit, video technology does not allow for your provider to perform a traditional examination. This may limit your provider's ability to fully assess your condition. If your provider identifies any concerns that need to be evaluated in person or the need to arrange testing (such as labs, EKG, etc.), we will make arrangements to do so. Although advances in technology are sophisticated, we cannot ensure that it will always work on either your end or our end. If the connection with a video visit is poor, the visit may have to be switched to a telephone visit. With either a video or telephone visit, we are not always able to ensure that we have a secure connection.  By engaging in this virtual visit, you consent to the provision of healthcare and authorize for your insurance to be billed (if applicable) for the services provided during this visit. Depending on your insurance coverage, you may receive a charge related to this service.  I need to obtain your verbal consent now. Are you willing to proceed with your visit today? Jeffery Wood has provided verbal consent on 12/15/2022 for a virtual visit (video or telephone). Jeffery Curio, FNP  Date: 12/15/2022 11:20 AM  Virtual Visit via Video Note   I, Jeffery Wood, connected with  Jeffery SKALICKY  ([id number], [birth year]) on 12/15/22 at 11:15 AM EST by a video-enabled telemedicine application and verified that I am speaking with the correct person using two identifiers.  Location: Patient: Virtual Visit Location Patient:  Home Provider: Virtual Visit Location Provider: Home Office   I discussed the limitations of evaluation and management by telemedicine and the availability of in person appointments. The patient expressed understanding and agreed to proceed.    History of Present Illness: Jeffery HIGGASON is a 67 y.o. who identifies as a male who was assigned male at birth, and is being seen today for sinus pressure and pain, post nasal drainage, green mucus, no fever, cough, mild wheezing, continues on inhaler, declines prednisone. Marland Kitchen  HPI: HPI  Problems:  Patient Active Problem List   Diagnosis Date Noted   Allergic rhinitis 05/15/2022   Memory impairment 01/06/2022   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   Right shoulder pain 08/29/2021   CAD (coronary artery disease) 06/10/2021   Onychomycosis 06/09/2021   Microscopic hematuria 04/04/2021   Dyslipidemia 11/08/2020   Hallux valgus 09/07/2020   Insomnia 12/16/2019   Spondylolisthesis of lumbar region 01/17/2018   Hypertension associated with diabetes (HCC) 11/29/2017   Asthma 11/29/2017   BPH (benign prostatic hyperplasia) 11/29/2017   Chronic low back pain 11/29/2017   Osteoarthritis 11/29/2017   Psoriasis 11/29/2017   GERD (gastroesophageal reflux disease) 11/29/2017   Neuropathic pain 04/14/2017   Constipation 03/04/2017   Lymphoma, 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   NHL (non-Hodgkin's lymphoma) (HCC) 02/07/2017   OSA (obstructive sleep apnea) 07/22/2013   Hyperglycemia 12/07/2010    Allergies: No Known Allergies Medications:  Current Outpatient Medications:    albuterol (PROVENTIL) (2.5 MG/3ML) 0.083% nebulizer solution, Take 2.5 mg by  nebulization every 4 (four) hours as needed for wheezing or shortness of breath., Disp: , Rfl:    ALBUTEROL SULFATE HFA IN, Inhale into the lungs. AS DIRECTED PRN, Disp: , Rfl:    atorvastatin (LIPITOR) 40 MG tablet, Take 1 tablet (40 mg total) by mouth daily., Disp: 90 tablet, Rfl: 2   augmented betamethasone  dipropionate (DIPROLENE-AF) 0.05 % cream, Apply 1 Application topically to affected areas on skin as directed., Disp: 50 g, Rfl: PRN   azelastine (ASTELIN) 0.1 % nasal spray, Place 2 sprays into both nostrils 2 (two) times daily., Disp: 30 mL, Rfl: 12   celecoxib (CELEBREX) 200 MG capsule, Take 1 capsule (200 mg total) by mouth 2 (two) times daily as needed., Disp: 60 capsule, Rfl: 5   ciclopirox (PENLAC) 8 % solution, Apply topically at bedtime. Apply over nail and surrounding skin. Apply daily over previous coat. After seven (7) days, may remove with alcohol and continue cycle., Disp: 6.6 mL, Rfl: 2   clopidogrel (PLAVIX) 75 MG tablet, Take 1 tablet (75 mg total) by mouth daily with breakfast., Disp: 90 tablet, Rfl: 1   cyclobenzaprine (FLEXERIL) 10 MG tablet, Take 1 tablet (10 mg total) by mouth 3 (three) times daily as needed for muscle spasms., Disp: 30 tablet, Rfl: 1   diazepam (VALIUM) 5 MG tablet, Take 1 tablet (5 mg total) by mouth every 12 (twelve) hours as needed (dizziness)., Disp: 10 tablet, Rfl: 0   donepezil (ARICEPT) 5 MG tablet, Take 1 tablet (5 mg total) by mouth daily., Disp: 30 tablet, Rfl: 1   DULoxetine (CYMBALTA) 30 MG capsule, Take 1 capsule (30 mg total) by mouth at bedtime., Disp: 90 capsule, Rfl: 1   Fluticasone-Umeclidin-Vilant (TRELEGY ELLIPTA) 100-62.5-25 MCG/ACT AEPB, Inhale 1 puff then rinse mouth, once daily, Disp: 60 each, Rfl: 12   gabapentin (NEURONTIN) 300 MG capsule, Take 4 capsules (1,200 mg total) by mouth 3 (three) times daily., Disp: 1080 capsule, Rfl: 3   glimepiride (AMARYL) 4 MG tablet, Take 0.5 tablets (2 mg total) by mouth daily before largest meal of the day., Disp: 60 tablet, Rfl: 1   isosorbide mononitrate (IMDUR) 30 MG 24 hr tablet, Take 1/2 tablet (15 mg total) by mouth daily., Disp: 90 tablet, Rfl: 3   levalbuterol (XOPENEX HFA) 45 MCG/ACT inhaler, Inhale 2 puffs into the lungs daily as needed for wheezing or shortness of breath., Disp: 15 g, Rfl: 1    loratadine (CLARITIN) 10 MG tablet, Take 10 mg by mouth daily., Disp: , Rfl:    metFORMIN (GLUCOPHAGE) 1000 MG tablet, Take 1 tablet (1,000 mg total) by mouth 2 (two) times daily., Disp: 180 tablet, Rfl: 0   metoCLOPramide (REGLAN) 10 MG tablet, Take 1 tablet (10 mg total) by mouth every 6 (six) hours as needed for nausea., Disp: 30 tablet, Rfl: 0   Multiple Vitamin (MULTIVITAMIN) capsule, Take 1 capsule by mouth daily., Disp: , Rfl:    nitroGLYCERIN (NITROSTAT) 0.4 MG SL tablet, Place 1 tablet (0.4 mg total) under the tongue every 5 (five) minutes as needed for chest pain. If no relief after 3 doses call 911., Disp: 25 tablet, Rfl: 3   OVER THE COUNTER MEDICATION, Take 1 tablet by mouth daily. [employer], Disp: , Rfl:    oxyCODONE-acetaminophen (PERCOCET/ROXICET) 5-325 MG tablet, Take 1 - 2 tablets by mouth every 6 hours as needed for severe pain., Disp: 20 tablet, Rfl: 0   pantoprazole (PROTONIX) 40 MG tablet, Take 1 tablet (40 mg total) by mouth  daily., Disp: 90 tablet, Rfl: 1   tamsulosin (FLOMAX) 0.4 MG CAPS capsule, Take 1 capsule (0.4 mg) by mouth daily., Disp: 90 capsule, Rfl: 1   traMADol (ULTRAM) 50 MG tablet, Take 1 tablet (50 mg total) by mouth every 8 (eight) hours as needed., Disp: 50 tablet, Rfl: 1   traZODone (DESYREL) 50 MG tablet, Take 1/2 - 1 tablet (25 - 50 mg) by mouth at bedtime as needed for sleep., Disp: 30 tablet, Rfl: 3  Observations/Objective: Patient is well-developed, well-nourished in no acute distress.  Resting comfortably  at home.  Head is normocephalic, atraumatic.  No labored breathing.  Speech is clear and coherent with logical content.  Patient is alert and oriented at baseline.    Assessment and Plan: 1. Acute bacterial sinusitis  2. Bronchitis  Increase fluids, humidifier at night, tylenol as directed, uc if sx worsen.   Follow Up Instructions: I discussed the assessment and treatment plan with the patient. The patient was provided an  opportunity to ask questions and all were answered. The patient agreed with the plan and demonstrated an understanding of the instructions.  A copy of instructions were sent to the patient via MyChart unless otherwise noted below.     The patient was advised to call back or seek an in-person evaluation if the symptoms worsen or if the condition fails to improve as anticipated.    Jeffery Curio, FNP

## 2022-12-15 NOTE — Telephone Encounter (Signed)
Spoke with pt. He insisted provider would just call something in. Advised they be seen by a provider. Pt refused. Stated if he needs it, he will schedule a virtual.

## 2022-12-15 NOTE — Patient Instructions (Signed)

## 2022-12-15 NOTE — Telephone Encounter (Signed)
Patient need OV for symptoms

## 2022-12-19 ENCOUNTER — Other Ambulatory Visit: Payer: Self-pay | Admitting: Internal Medicine

## 2022-12-19 ENCOUNTER — Other Ambulatory Visit (HOSPITAL_COMMUNITY): Payer: Self-pay

## 2022-12-20 ENCOUNTER — Ambulatory Visit: Payer: Medicare PPO | Attending: Cardiovascular Disease | Admitting: Cardiology

## 2022-12-20 ENCOUNTER — Other Ambulatory Visit: Payer: Self-pay

## 2022-12-20 ENCOUNTER — Other Ambulatory Visit (HOSPITAL_COMMUNITY): Payer: Self-pay

## 2022-12-20 DIAGNOSIS — Z01810 Encounter for preprocedural cardiovascular examination: Secondary | ICD-10-CM | POA: Diagnosis not present

## 2022-12-20 MED ORDER — CLOPIDOGREL BISULFATE 75 MG PO TABS
75.0000 mg | ORAL_TABLET | Freq: Every day | ORAL | 1 refills | Status: DC
  Filled 2022-12-20: qty 90, 90d supply, fill #0
  Filled 2023-03-17: qty 90, 90d supply, fill #1

## 2022-12-20 NOTE — Progress Notes (Signed)
Virtual Visit via Telephone Note   Because of Jeffery Wood's co-morbid illnesses, he is at least at moderate risk for complications without adequate follow up.  This format is felt to be most appropriate for this patient at this time.  The patient did not have access to video technology/had technical difficulties with video requiring transitioning to audio format only (telephone).  All issues noted in this document were discussed and addressed.  No physical exam could be performed with this format.  Please refer to the patient's chart for his consent to telehealth for [facility].  Evaluation Performed:  Preoperative cardiovascular risk assessment _____________   Date:  12/20/2022   Patient ID:  Jeffery Wood, DOB 1955-04-11, MRN [id number] Patient Location:  Home Provider location:   Office  Primary Care Provider:  Ardith Dark, MD Primary Cardiologist:  Dietrich Pates, MD  Chief Complaint / Patient Profile   67 y.o. y/o male with a h/o CAD s/p DES to mid LAD, distal LAD, first diagonal ostium, large cell lymphoma, COPD, hypertension, diabetes mellitus type 2 who is pending Colonoscopy on 01/11/2023 with Dr. Vida Rigger and presents today for telephonic preoperative cardiovascular risk assessment.  History of Present Illness    Jeffery Wood is a 67 y.o. male who presents via audio/video conferencing for a telehealth visit today.  Pt was last seen in cardiology clinic on 09/13/22 by Dr. Tenny Craw.  At that time Jeffery Wood was doing well.  The patient is now pending procedure as outlined above. Since his last visit, he has remained stable from a cardiac perspective.  He denies chest pain, palpitations, dyspnea, pnd, orthopnea, n, v, dizziness, syncope, edema, weight gain, or early satiety.  Past Medical History    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    CAD (coronary artery disease) 2023   5v    Cancer (HCC)    basal cell of right temple; carcinoids   Complication of anesthesia    hard to wake up after bronchoscopy   Diabetes (HCC)    type 2   Dyspnea    GERD (gastroesophageal reflux disease)    Gout    resolved, no current problem   Headache    History of hiatal hernia 05/16/2006   small noted on EGD   Hypertension    hx   Lung mass    Lymphoma in remission    last chemo 05/2017, in remission-chest, but all over   Memory loss    mild - r/t chemo, 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   Neuromuscular disorder (HCC)    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   Sleep apnea    does not use anything for OSA   Past Surgical History:  Procedure Laterality Date   BACK SURGERY     L4-L5 bracket and screws   BICEPS TENDON REPAIR Left    CARPAL TUNNEL RELEASE     COLONOSCOPY     CORONARY BALLOON ANGIOPLASTY N/A 06/10/2021   Procedure: CORONARY BALLOON ANGIOPLASTY;  Surgeon: Corky Crafts, MD;  Location: [facility] INVASIVE CV LAB;  Service: Cardiovascular;  Laterality: N/A;   CORONARY STENT INTERVENTION N/A 06/10/2021   Procedure: CORONARY STENT INTERVENTION;  Surgeon: Corky Crafts, MD;  Location: [facility] INVASIVE CV LAB;  Service: Cardiovascular;  Laterality: N/A;   CORONARY ULTRASOUND/IVUS N/A 06/10/2021   Procedure: Intravascular Ultrasound/IVUS;  Surgeon: Corky Crafts, MD;  Location: [facility] INVASIVE CV LAB;  Service: Cardiovascular;  Laterality: N/A;   FINGER SURGERY     KNEE ARTHROSCOPY     ACL tear   LEFT HEART CATH AND CORONARY ANGIOGRAPHY N/A 06/10/2021   Procedure: LEFT HEART CATH AND CORONARY ANGIOGRAPHY;  Surgeon: Corky Crafts, MD;  Location: [facility] INVASIVE CV LAB;  Service: Cardiovascular;  Laterality: N/A;   LEFT HEART CATHETERIZATION WITH CORONARY ANGIOGRAM N/A 12/07/2010   Procedure: LEFT HEART CATHETERIZATION WITH CORONARY ANGIOGRAM;  Surgeon: Marykay Lex, MD;  Location: [facility] CATH LAB;  Service: Cardiovascular;  Laterality: N/A;   LUMBAR  LAMINECTOMY/DECOMPRESSION MICRODISCECTOMY Right 12/09/2020   Procedure: MICRODISCECTOMY, RIGHT LUMBAR TWO-THREE, LUMBAR THREE-FOUR;  Surgeon: Tressie Stalker, MD;  Location: [facility] OR;  Service: Neurosurgery;  Laterality: Right;   PILONIDAL CYST EXCISION     PORT-A-CATH REMOVAL N/A 11/09/2017   Procedure: REMOVAL PORT-A-CATH;  Surgeon: Luretha Murphy, MD;  Location: [facility];  Service: General;  Laterality: N/A;  local   PORTACATH PLACEMENT Left 02/27/2017   Procedure: INSERTION PORT-A-CATH;  Surgeon: Luretha Murphy, MD;  Location: [facility] ORS;  Service: General;  Laterality: Left;   UPPER GASTROINTESTINAL ENDOSCOPY  05/16/2006   VIDEO BRONCHOSCOPY WITH ENDOBRONCHIAL ULTRASOUND N/A 01/24/2017   Procedure: VIDEO BRONCHOSCOPY WITH ENDOBRONCHIAL ULTRASOUND with TRANSBRONCHIAL BIOPSY;  Surgeon: Delight Ovens, MD;  Location: [facility] OR;  Service: Thoracic;  Laterality: N/A;   VIDEO MEDIASTINOSCOPY N/A 02/05/2017   Procedure: VIDEO MEDIASTINOSCOPY;  Surgeon: Delight Ovens, MD;  Location: [facility] OR;  Service: Thoracic;  Laterality: N/A;    Allergies  No Known Allergies  Home Medications    Prior to Admission medications   Medication Sig Start Date End Date Taking? Authorizing Provider  albuterol (PROVENTIL) (2.5 MG/3ML) 0.083% nebulizer solution Take 2.5 mg by nebulization every 4 (four) hours as needed for wheezing or shortness of breath.    [provider]  ALBUTEROL SULFATE HFA IN Inhale into the lungs. AS DIRECTED PRN    [provider]  atorvastatin (LIPITOR) 40 MG tablet Take 1 tablet (40 mg total) by mouth daily. 06/27/22   Swinyer, Zachary George, NP  augmented betamethasone dipropionate (DIPROLENE-AF) 0.05 % cream Apply 1 Application topically to affected areas on skin as directed. 07/24/22   Ardith Dark, MD  azelastine (ASTELIN) 0.1 % nasal spray Place 2 sprays into both nostrils 2 (two) times daily. 05/15/22   Ardith Dark, MD  celecoxib (CELEBREX) 200 MG  capsule Take 1 capsule (200 mg total) by mouth 2 (two) times daily as needed. 09/21/22   Ardith Dark, MD  ciclopirox [facility]) 8 % solution Apply topically at bedtime. Apply over nail and surrounding skin. Apply daily over previous coat. After seven (7) days, may remove with alcohol and continue cycle. 11/22/21   [name], DPM  clopidogrel (PLAVIX) 75 MG tablet Take 1 tablet (75 mg total) by mouth daily with breakfast. 12/20/22   Pricilla Riffle, MD  cyclobenzaprine (FLEXERIL) 10 MG tablet Take 1 tablet (10 mg total) by mouth 3 (three) times daily as needed for muscle spasms. 12/09/20   Val Eagle D, NP  diazepam (VALIUM) 5 MG tablet Take 1 tablet (5 mg total) by mouth every 12 (twelve) hours as needed (dizziness). 10/31/22   Sherian Maroon A, PA  donepezil (ARICEPT) 5 MG tablet Take 1 tablet (5 mg total) by mouth daily. 10/17/22   Ardith Dark, MD  doxycycline (VIBRA-TABS) 100 MG tablet Take 1  tablet (100 mg total) by mouth 2 (two) times daily for 10 days. 12/15/22 12/25/22  Delorse Lek, FNP  DULoxetine (CYMBALTA) 30 MG capsule Take 1 capsule (30 mg total) by mouth at bedtime. 06/27/22   Ardith Dark, MD  Fluticasone-Umeclidin-Vilant (TRELEGY ELLIPTA) 100-62.5-25 MCG/ACT AEPB Inhale 1 puff then rinse mouth, once daily 11/07/22   Jetty Duhamel D, MD  gabapentin (NEURONTIN) 300 MG capsule Take 4 capsules (1,200 mg total) by mouth 3 (three) times daily. 10/12/22 10/12/23  Ardith Dark, MD  glimepiride (AMARYL) 4 MG tablet Take 0.5 tablets (2 mg total) by mouth daily before largest meal of the day. 11/02/22   Ardith Dark, MD  isosorbide mononitrate (IMDUR) 30 MG 24 hr tablet Take 1/2 tablet (15 mg total) by mouth daily. 04/03/22   Gaston Islam., NP  levalbuterol [facility] HFA) 45 MCG/ACT inhaler Inhale 2 puffs into the lungs daily as needed for wheezing or shortness of breath. 11/02/22   Ardith Dark, MD  loratadine (CLARITIN) 10 MG tablet Take 10 mg by mouth daily.     [provider]  metFORMIN (GLUCOPHAGE) 1000 MG tablet Take 1 tablet (1,000 mg total) by mouth 2 (two) times daily. 11/28/22   Ardith Dark, MD  metoCLOPramide (REGLAN) 10 MG tablet Take 1 tablet (10 mg total) by mouth every 6 (six) hours as needed for nausea. 10/31/22   Peter Garter, PA  Multiple Vitamin (MULTIVITAMIN) capsule Take 1 capsule by mouth daily.    [provider]  nitroGLYCERIN (NITROSTAT) 0.4 MG SL tablet Place 1 tablet (0.4 mg total) under the tongue every 5 (five) minutes as needed for chest pain. If no relief after 3 doses call 911. 09/13/22   Pricilla Riffle, MD  OVER THE COUNTER MEDICATION Take 1 tablet by mouth daily. Pharmacist, community, Historical, MD  oxyCODONE-acetaminophen (PERCOCET/ROXICET) 5-325 MG tablet Take 1 - 2 tablets by mouth every 6 hours as needed for severe pain. 08/21/22   [name], DPM  pantoprazole (PROTONIX) 40 MG tablet Take 1 tablet (40 mg total) by mouth daily. 11/21/22   Ardith Dark, MD  tamsulosin (FLOMAX) 0.4 MG CAPS capsule Take 1 capsule (0.4 mg) by mouth daily. 09/11/22   Ardith Dark, MD  traMADol (ULTRAM) 50 MG tablet Take 1 tablet (50 mg total) by mouth every 8 (eight) hours as needed. 09/11/22     traZODone (DESYREL) 50 MG tablet Take 1/2 - 1 tablet (25 - 50 mg) by mouth at bedtime as needed for sleep. 09/11/22   Ardith Dark, MD    Physical Exam    Vital Signs:  Jeffery Wood does not have vital signs available for review today.  Given telephonic nature of communication, physical exam is limited. AAOx3. NAD. Normal affect.  Speech and respirations are unlabored.  Accessory Clinical Findings    None  Assessment & Plan    1.  Preoperative Cardiovascular Risk Assessment: Colonoscopy on 01/11/2023 with Dr. Vida Rigger  Jeffery Wood's perioperative risk of a major cardiac event is 0.9% according to the Revised Cardiac Risk Index (RCRI).  Therefore, he is at low risk for perioperative  complications. His functional capacity is good at 6.36 METs according to the Duke Activity Status Index (DASI). Recommendations: According to ACC/AHA guidelines, no further cardiovascular testing needed.  The patient may proceed to surgery at acceptable risk.   Antiplatelet and/or Anticoagulation Recommendations: Per protocol patient can hold Plavix 5  days prior to procedure and should restart postprocedure when surgically safe to do so.   The patient was advised that if he develops new symptoms prior to surgery to contact our office to arrange for a follow-up visit, and he verbalized understanding.  A copy of this note will be routed to requesting surgeon.  Time:   Today, I have spent 6 minutes with the patient with telehealth technology discussing medical history, symptoms, and management plan.    Rip Harbour, NP  12/20/2022, 3:39 PM

## 2022-12-22 ENCOUNTER — Other Ambulatory Visit (HOSPITAL_COMMUNITY): Payer: Self-pay

## 2022-12-22 MED ORDER — TRAMADOL HCL 50 MG PO TABS
50.0000 mg | ORAL_TABLET | Freq: Three times a day (TID) | ORAL | 1 refills | Status: DC
  Filled 2022-12-22: qty 50, 17d supply, fill #0
  Filled 2023-02-16: qty 50, 17d supply, fill #1

## 2022-12-26 ENCOUNTER — Ambulatory Visit: Payer: Medicare PPO | Admitting: Family Medicine

## 2022-12-26 ENCOUNTER — Telehealth: Payer: Self-pay | Admitting: Family Medicine

## 2022-12-26 ENCOUNTER — Other Ambulatory Visit (HOSPITAL_COMMUNITY): Payer: Self-pay

## 2022-12-26 ENCOUNTER — Other Ambulatory Visit: Payer: Self-pay

## 2022-12-26 ENCOUNTER — Encounter: Payer: Self-pay | Admitting: Family Medicine

## 2022-12-26 ENCOUNTER — Other Ambulatory Visit: Payer: Self-pay | Admitting: Podiatry

## 2022-12-26 VITALS — BP 120/80 | HR 82 | Temp 98.0°F | Ht 75.0 in | Wt 246.2 lb

## 2022-12-26 DIAGNOSIS — E1159 Type 2 diabetes mellitus with other circulatory complications: Secondary | ICD-10-CM | POA: Diagnosis not present

## 2022-12-26 DIAGNOSIS — J449 Chronic obstructive pulmonary disease, unspecified: Secondary | ICD-10-CM

## 2022-12-26 DIAGNOSIS — R059 Cough, unspecified: Secondary | ICD-10-CM

## 2022-12-26 DIAGNOSIS — I152 Hypertension secondary to endocrine disorders: Secondary | ICD-10-CM | POA: Diagnosis not present

## 2022-12-26 DIAGNOSIS — M25511 Pain in right shoulder: Secondary | ICD-10-CM | POA: Diagnosis not present

## 2022-12-26 MED ORDER — AMOXICILLIN-POT CLAVULANATE 875-125 MG PO TABS
1.0000 | ORAL_TABLET | Freq: Two times a day (BID) | ORAL | 0 refills | Status: DC
  Filled 2022-12-26: qty 20, 10d supply, fill #0

## 2022-12-26 MED ORDER — CICLOPIROX 8 % EX SOLN
Freq: Every day | CUTANEOUS | 2 refills | Status: AC
  Filled 2022-12-26: qty 6.6, 30d supply, fill #0
  Filled 2023-05-14: qty 6.6, 30d supply, fill #1

## 2022-12-26 MED ORDER — METHYLPREDNISOLONE ACETATE 80 MG/ML IJ SUSP
80.0000 mg | Freq: Once | INTRAMUSCULAR | Status: AC
  Administered 2022-12-26: 80 mg via INTRA_ARTICULAR

## 2022-12-26 NOTE — Addendum Note (Signed)
Addended by: Ardith Dark on: 12/26/2022 01:41 PM   Modules accepted: Orders

## 2022-12-26 NOTE — Assessment & Plan Note (Signed)
Mild flare on exam today related to recent URI.  No signs of respiratory distress and satting at 96% on room air.  Did not have much improved with doxycycline.  Will try Augmentin.  He declined oral prednisone due to history of bad side effects.  He did receive a intra-articular injection today which should also give him some systemic benefit hopefully.  He will let us know if not improving in the next few days.  We discussed reasons to return to care.

## 2022-12-26 NOTE — Patient Instructions (Addendum)
It was very nice to see you today!  I think you have a bronchitis flareup.  Will give you Augmentin.  Please continue taking inhalers.  Let us know if not improving.  We injected your shoulder today.  Please let us know if this is not proving in the next few days and I can refer you to sports medicine orthopedics.  Return if symptoms worsen or fail to improve.   Take care, Dr Jimmey Ralph  PLEASE NOTE:  If you had any lab tests, please let us know if you have not heard back within a few days. You may see your results on mychart before we have a chance to review them but we will give you a call once they are reviewed by [place].   If we ordered any referrals today, please let us know if you have not heard from their office within the next week.   If you had any urgent prescriptions sent in today, please check with the pharmacy within an hour of our visit to make sure the prescription was transmitted appropriately.   Please try these tips to maintain a healthy lifestyle:  Eat at least 3 REAL meals and 1-2 snacks per day.  Aim for no more than 5 hours between eating.  If you eat breakfast, please do so within one hour of getting up.   Each meal should contain half fruits/vegetables, one quarter protein, and one quarter carbs (no bigger than a computer mouse)  Cut down on sweet beverages. This includes juice, soda, and sweet tea.   Drink at least 1 glass of water with each meal and aim for at least 8 glasses per day  Exercise at least 150 minutes every week.

## 2022-12-26 NOTE — Telephone Encounter (Signed)
Patient called re: RX for Augmentin has not been received by the following Pharmacy. Please send RX to  [facility] Phone: [phone]  Fax: [phone]

## 2022-12-26 NOTE — Assessment & Plan Note (Signed)
He has had a flareup the last couple of days.  This is been a chronic issue for him on and off for several months.  He was given home exercises at her last visit about a month or so ago.  He would like to have a cortisone shot done today.  This was performed.  See below procedure note.  He tolerated well.  He will continue working on home exercises as well.  If not improving would consider referral to PT or sports medicine.

## 2022-12-26 NOTE — Telephone Encounter (Signed)
I called [facility] pharmacy, per the pharmacist his medication for Augmentin will be mailed to him by the end of the day or tomorrow. I called pt to let him know, he expressed clear understanding.

## 2022-12-26 NOTE — Addendum Note (Signed)
Addended by: Dyann Kief on: 12/26/2022 11:32 AM   Modules accepted: Orders

## 2022-12-26 NOTE — Progress Notes (Signed)
Jeffery Wood is a 67 y.o. male who presents today for an office visit.  Assessment/Plan:  New/Acute Problems: Cough No signs of respiratory distress on exam.  Still has quite a bit of rhonchi and wheezing.  Likely has continued COPD flare.  We did discuss prednisone burst however he declined.  He will be getting a intra-articular steroid injection as below which should give him some systemic benefit.  We will treat with course of Augmentin.  He can continue taking his home inhalers.  He will let us know if not improving in the next several days.  We discussed reasons to return to care and seek emergent care.  Chronic Problems Addressed Today: COPD mixed type (HCC) Mild flare on exam today related to recent URI.  No signs of respiratory distress and satting at 96% on room air.  Did not have much improved with doxycycline.  Will try Augmentin.  He declined oral prednisone due to history of bad side effects.  He did receive a intra-articular injection today which should also give him some systemic benefit hopefully.  He will let us know if not improving in the next few days.  We discussed reasons to return to care.  Right shoulder pain He has had a flareup the last couple of days.  This is been a chronic issue for him on and off for several months.  He was given home exercises at her last visit about a month or so ago.  He would like to have a cortisone shot done today.  This was performed.  See below procedure note.  He tolerated well.  He will continue working on home exercises as well.  If not improving would consider referral to PT or sports medicine.     Subjective:  HPI:  See Assessment / plan for status of chronic conditions.  Patient is here today with cough and congestion for the last 10 days.  He did a virtual visit about a week and half ago with a different provider. He was started on doxycycline which has helped but symptoms have still persisted. He is having a lot of cough. He has  been using his xopenex which helps.   He has had worsening pain in his right shoulder the last few days.  This is a chronic issue but has flared up recently. He would like to have a steroid injection for this today. No obvious injuries or precipitating events.        Objective:  Physical Exam: BP 120/80   Pulse 82   Temp 98 F (36.7 C) (Temporal)   Ht 6\' 3"  (1.905 m)   Wt 246 lb 3.2 oz (111.7 kg)   SpO2 96%   BMI 30.77 kg/m   Gen: No acute distress, resting comfortably CV: Regular rate and rhythm with no murmurs appreciated Pulm: Normal work of breathing, several coarse rhonchi noted throughout all lung fields. MUSCULOSKELETAL: Right shoulder with limited range of motion due to pain.  Pain with abduction and flexion.  Neurovascular intact distally. Neuro: Grossly normal, moves all extremities Psych: Normal affect and thought content  Shoulder Injection Procedure Note  Pre-operative Diagnosis: Right Shoulder Pain  Post-operative Diagnosis: same  Indications: Pain Relief   Anesthesia: Topical ethyl chloride  Procedure Details   Verbal consent was obtained for the procedure after discussion of risk versus benefits as well as alternative treatments. The shoulder was prepped with iodine and the skin was anesthetized with topical ethyl choride. Using a 25 gauge needle the  subacromial space was injected with 3 mL 1% lidocaine and 1 mL of 80cc/ml depomedrol under the posterior aspect of the acromion. The injection site was cleansed with topical isopropyl alcohol and a dressing was applied.  Complications:  None; patient tolerated the procedure well.       Katina Degree. Jimmey Ralph, MD 12/26/2022 11:25 AM

## 2023-01-01 ENCOUNTER — Other Ambulatory Visit: Payer: Self-pay

## 2023-01-08 ENCOUNTER — Encounter: Payer: Self-pay | Admitting: Oncology

## 2023-01-08 ENCOUNTER — Other Ambulatory Visit (HOSPITAL_COMMUNITY): Payer: Self-pay

## 2023-01-08 MED ORDER — PEG 3350-KCL-NA BICARB-NACL 420 G PO SOLR
ORAL | 0 refills | Status: DC
  Filled 2023-01-08: qty 4000, 1d supply, fill #0

## 2023-01-08 MED ORDER — BISACODYL 5 MG PO TBEC
DELAYED_RELEASE_TABLET | ORAL | 0 refills | Status: DC
  Filled 2023-01-08: qty 4, 1d supply, fill #0

## 2023-01-11 DIAGNOSIS — K635 Polyp of colon: Secondary | ICD-10-CM | POA: Diagnosis not present

## 2023-01-11 DIAGNOSIS — Z83719 Family history of colon polyps, unspecified: Secondary | ICD-10-CM | POA: Diagnosis not present

## 2023-01-11 DIAGNOSIS — Z860100 Personal history of colon polyps, unspecified: Secondary | ICD-10-CM | POA: Diagnosis not present

## 2023-01-11 DIAGNOSIS — K5730 Diverticulosis of large intestine without perforation or abscess without bleeding: Secondary | ICD-10-CM | POA: Diagnosis not present

## 2023-01-11 DIAGNOSIS — Z09 Encounter for follow-up examination after completed treatment for conditions other than malignant neoplasm: Secondary | ICD-10-CM | POA: Diagnosis not present

## 2023-01-11 LAB — HM COLONOSCOPY

## 2023-01-15 DIAGNOSIS — K635 Polyp of colon: Secondary | ICD-10-CM | POA: Diagnosis not present

## 2023-01-17 ENCOUNTER — Other Ambulatory Visit: Payer: Self-pay | Admitting: Family Medicine

## 2023-01-18 ENCOUNTER — Other Ambulatory Visit: Payer: Self-pay | Admitting: Family Medicine

## 2023-01-18 ENCOUNTER — Other Ambulatory Visit: Payer: Self-pay

## 2023-01-18 MED ORDER — TRAZODONE HCL 50 MG PO TABS
25.0000 mg | ORAL_TABLET | Freq: Every evening | ORAL | 3 refills | Status: DC | PRN
  Filled 2023-01-18: qty 30, 30d supply, fill #0
  Filled 2023-03-02: qty 30, 30d supply, fill #1
  Filled 2023-03-31: qty 30, 30d supply, fill #2
  Filled 2023-04-30: qty 30, 30d supply, fill #3

## 2023-01-19 ENCOUNTER — Other Ambulatory Visit: Payer: Self-pay

## 2023-01-19 NOTE — Telephone Encounter (Signed)
We saw him a month ago for upper respiratory infection (URI). Recommend he come back in if he is still having symptoms.

## 2023-01-20 ENCOUNTER — Other Ambulatory Visit (HOSPITAL_COMMUNITY): Payer: Self-pay

## 2023-01-21 ENCOUNTER — Telehealth: Payer: Medicare PPO | Admitting: Family Medicine

## 2023-01-21 DIAGNOSIS — R6889 Other general symptoms and signs: Secondary | ICD-10-CM

## 2023-01-21 MED ORDER — OSELTAMIVIR PHOSPHATE 75 MG PO CAPS: 75.0000 mg | ORAL_CAPSULE | Freq: Two times a day (BID) | ORAL | 0 refills | Status: DC

## 2023-01-21 NOTE — Progress Notes (Signed)

## 2023-01-25 ENCOUNTER — Other Ambulatory Visit: Payer: Self-pay | Admitting: Family Medicine

## 2023-01-25 NOTE — Telephone Encounter (Signed)
Copied from CRM [id number]. Topic: Clinical - Medication Refill >> Jan 25, 2023  2:47 PM Corin V wrote: Most Recent Primary Care Visit:  Provider: Ardith Dark  Department: [facility]  Visit Type: OFFICE VISIT  Date: 12/26/2022  Medication: amoxicillin-clavulanate (AUGMENTIN) 875-125 MG tablet  Has the patient contacted their pharmacy? Yes, no refills available. Patient stated pharmacy told him Dr. Jimmey Ralph denied/ (Agent: If no, request that the patient contact the pharmacy for the refill. If patient does not wish to contact the pharmacy document the reason why and proceed with request.) (Agent: If yes, when and what did the pharmacy advise?)  Is this the correct pharmacy for this prescription? Yes If no, delete pharmacy and type the correct one.  This is the patient's preferred pharmacy:  [facility] [address] [place] [address] Phone: [phone] Fax: [phone]  Has the prescription been filled recently? No  Is the patient out of the medication? Yes  Has the patient been seen for an appointment in the last year OR does the patient have an upcoming appointment? Yes  Can we respond through MyChart? No  Agent: Please be advised that Rx refills may take up to 3 business days. We ask that you follow-up with your pharmacy.

## 2023-01-25 NOTE — Telephone Encounter (Signed)
Copied from CRM [id number]. Topic: Clinical - Prescription Issue >> Jan 25, 2023  2:52 PM Corin V wrote: Reason for CRM: Patient called in to request refill on his augmentin Rx. Epic resolved refill request instead of routing to clinic. Please reference CRM# [id number] for additional information.  Spoke with patient patient aware need OV  OV was schedule for tomorrow

## 2023-01-26 ENCOUNTER — Other Ambulatory Visit: Payer: Self-pay

## 2023-01-26 ENCOUNTER — Ambulatory Visit: Payer: Medicare PPO | Admitting: Family

## 2023-01-26 ENCOUNTER — Other Ambulatory Visit (HOSPITAL_COMMUNITY): Payer: Self-pay

## 2023-01-26 ENCOUNTER — Encounter: Payer: Self-pay | Admitting: Family

## 2023-01-26 VITALS — BP 137/83 | HR 82 | Temp 97.8°F | Ht 75.0 in | Wt 244.2 lb

## 2023-01-26 DIAGNOSIS — J209 Acute bronchitis, unspecified: Secondary | ICD-10-CM

## 2023-01-26 LAB — CBC WITH DIFFERENTIAL/PLATELET
Basophils Absolute: 0.1 10*3/uL (ref 0.0–0.1)
Basophils Relative: 0.7 % (ref 0.0–3.0)
Eosinophils Absolute: 0.2 10*3/uL (ref 0.0–0.7)
Eosinophils Relative: 2.8 % (ref 0.0–5.0)
HCT: 45.2 % (ref 39.0–52.0)
Hemoglobin: 15.2 g/dL (ref 13.0–17.0)
Lymphocytes Relative: 17.6 % (ref 12.0–46.0)
Lymphs Abs: 1.4 10*3/uL (ref 0.7–4.0)
MCHC: 33.7 g/dL (ref 30.0–36.0)
MCV: 91.9 fL (ref 78.0–100.0)
Monocytes Absolute: 0.6 10*3/uL (ref 0.1–1.0)
Monocytes Relative: 7.7 % (ref 3.0–12.0)
Neutro Abs: 5.5 10*3/uL (ref 1.4–7.7)
Neutrophils Relative %: 71.2 % (ref 43.0–77.0)
Platelets: 257 10*3/uL (ref 150.0–400.0)
RBC: 4.92 Mil/uL (ref 4.22–5.81)
RDW: 14.7 % (ref 11.5–15.5)
WBC: 7.7 10*3/uL (ref 4.0–10.5)

## 2023-01-26 MED ORDER — METHYLPREDNISOLONE ACETATE 80 MG/ML IJ SUSP
80.0000 mg | Freq: Once | INTRAMUSCULAR | Status: AC
  Administered 2023-01-26: 80 mg via INTRAMUSCULAR

## 2023-01-26 MED ORDER — LEVOFLOXACIN 750 MG PO TABS
750.0000 mg | ORAL_TABLET | Freq: Every day | ORAL | 0 refills | Status: DC
  Filled 2023-01-26: qty 5, 5d supply, fill #0

## 2023-01-26 MED ORDER — IPRATROPIUM-ALBUTEROL 0.5-2.5 (3) MG/3ML IN SOLN
3.0000 mL | Freq: Four times a day (QID) | RESPIRATORY_TRACT | 0 refills | Status: AC | PRN
  Filled 2023-01-26 (×2): qty 180, 15d supply, fill #0

## 2023-01-26 NOTE — Progress Notes (Signed)
Patient ID: Jeffery Wood, male    DOB: 09/24/1955, 67 y.o.   MRN: [id number]  Chief Complaint  Patient presents with   Cough    Cough with green mucus, SOB, fatigue, headaches. Prescribed z-pack and tamiflu which did not help sx. Also tried Augmentin which did help. SX. SX present for about a month.        Discussed the use of AI scribe software for clinical note transcription with the patient, who gave verbal consent to proceed.  History of Present Illness   Jeffery Wood, a patient with a history of large B cell non-Hodgkin's lymphoma, asthma, and diabetes, presents with persistent symptoms of sinus and chest congestion. The symptoms began in November and have not improved despite multiple courses of antibiotics, including Z-Pak and Augmentin, and a course of Tamiflu. The patient describes the congestion as constant and producing a large amount of mucus. The patient also reports a scratchy throat, which he attributes to the constant coughing and drainage. The patient denies any ear pain or pressure but does report having headaches. The patient also mentions a history of COPD-asthma , for which he uses Trelegy and an albuterol rescue inhaler. He states he has a nebulizer machine at home, but the medicine for it is at least 67 years old. He also has diabetes, which is managed with metformin and glipizide, last A1C wnl.    Assessment & Plan:     Upper Respiratory Infection - Persistent sinus and chest congestion with colored mucus despite multiple courses of antibiotics (Z-Pak, Augmentin, Tamiflu). No known exposure to flu. Ex-smoker since 1990. Currently using Astelin nasal spray and Mucinex with partial relief. Lungs congested with expiratory wheezing. Pt reports SE with taking prednisone. -Administer Depo Medrol 80mg   injection today. -Prescribe Levaquin 750mg  x5d as next step antibiotic. -Prescribe Duoneb (albuterol and ipratropium) for nebulizer use in the morning, at night, and as needed during  the day. -Order CBC w/diff today. -Consider chest x-ray if necessary.     Subjective:    Outpatient Medications Prior to Visit  Medication Sig Dispense Refill   albuterol (PROVENTIL) (2.5 MG/3ML) 0.083% nebulizer solution Take 2.5 mg by nebulization every 4 (four) hours as needed for wheezing or shortness of breath.     ALBUTEROL SULFATE HFA IN Inhale into the lungs. AS DIRECTED PRN     atorvastatin (LIPITOR) 40 MG tablet Take 1 tablet (40 mg total) by mouth daily. 90 tablet 2   augmented betamethasone dipropionate (DIPROLENE-AF) 0.05 % cream Apply 1 Application topically to affected areas on skin as directed. 50 g PRN   azelastine (ASTELIN) 0.1 % nasal spray Place 2 sprays into both nostrils 2 (two) times daily. 30 mL 12   bisacodyl (DULCOLAX) 5 MG EC tablet Take by mouth as directed. 4 tablet 0   celecoxib (CELEBREX) 200 MG capsule Take 1 capsule (200 mg total) by mouth 2 (two) times daily as needed. 60 capsule 5   ciclopirox (PENLAC) 8 % solution Apply topically at bedtime. Apply over nail and surrounding skin. Apply daily over previous coat. After seven (7) days, may remove with alcohol and continue cycle. 6.6 mL 2   clopidogrel (PLAVIX) 75 MG tablet Take 1 tablet (75 mg total) by mouth daily with breakfast. 90 tablet 1   cyclobenzaprine (FLEXERIL) 10 MG tablet Take 1 tablet (10 mg total) by mouth 3 (three) times daily as needed for muscle spasms. 30 tablet 1   diazepam (VALIUM) 5 MG tablet Take 1 tablet (5 mg  total) by mouth every 12 (twelve) hours as needed (dizziness). 10 tablet 0   Fluticasone-Umeclidin-Vilant (TRELEGY ELLIPTA) 100-62.5-25 MCG/ACT AEPB Inhale 1 puff then rinse mouth, once daily 60 each 12   gabapentin (NEURONTIN) 300 MG capsule Take 4 capsules (1,200 mg total) by mouth 3 (three) times daily. 1080 capsule 3   glimepiride (AMARYL) 4 MG tablet Take 0.5 tablets (2 mg total) by mouth daily before largest meal of the day. 60 tablet 1   isosorbide mononitrate (IMDUR) 30 MG 24  hr tablet Take 1/2 tablet (15 mg total) by mouth daily. 90 tablet 3   levalbuterol (XOPENEX HFA) 45 MCG/ACT inhaler Inhale 2 puffs into the lungs daily as needed for wheezing or shortness of breath. 15 g 1   loratadine (CLARITIN) 10 MG tablet Take 10 mg by mouth daily.     metFORMIN (GLUCOPHAGE) 1000 MG tablet Take 1 tablet (1,000 mg total) by mouth 2 (two) times daily. 180 tablet 0   metoCLOPramide (REGLAN) 10 MG tablet Take 1 tablet (10 mg total) by mouth every 6 (six) hours as needed for nausea. 30 tablet 0   Multiple Vitamin (MULTIVITAMIN) capsule Take 1 capsule by mouth daily.     nitroGLYCERIN (NITROSTAT) 0.4 MG SL tablet Place 1 tablet (0.4 mg total) under the tongue every 5 (five) minutes as needed for chest pain. If no relief after 3 doses call 911. 25 tablet 3   OVER THE COUNTER MEDICATION Take 1 tablet by mouth daily. [employer]     oxyCODONE-acetaminophen (PERCOCET/ROXICET) 5-325 MG tablet Take 1 - 2 tablets by mouth every 6 hours as needed for severe pain. 20 tablet 0   pantoprazole (PROTONIX) 40 MG tablet Take 1 tablet (40 mg total) by mouth daily. 90 tablet 1   polyethylene glycol-electrolytes (NULYTELY) 420 g solution Use as Directed 4000 mL 0   tamsulosin (FLOMAX) 0.4 MG CAPS capsule Take 1 capsule (0.4 mg) by mouth daily. 90 capsule 1   traMADol (ULTRAM) 50 MG tablet Take 1 tablet (50 mg total) by mouth every 8 (eight) hours as needed 50 tablet 1   traZODone (DESYREL) 50 MG tablet Take 1/2 - 1 tablet (25 - 50 mg) by mouth at bedtime as needed for sleep. 30 tablet 3   amoxicillin-clavulanate (AUGMENTIN) 875-125 MG tablet Take 1 tablet by mouth 2 (two) times daily. (Patient not taking: Reported on 01/26/2023) 20 tablet 0   donepezil (ARICEPT) 5 MG tablet Take 1 tablet (5 mg total) by mouth daily. (Patient not taking: Reported on 01/26/2023) 30 tablet 1   DULoxetine (CYMBALTA) 30 MG capsule Take 1 capsule (30 mg total) by mouth at bedtime. (Patient not taking: Reported on  01/26/2023) 90 capsule 1   oseltamivir (TAMIFLU) 75 MG capsule Take 1 capsule (75 mg total) by mouth 2 (two) times daily for 5 days. (Patient not taking: Reported on 01/26/2023) 10 capsule 0   No facility-administered medications prior to visit.   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    CAD (coronary artery disease) 2023   5v   Cancer (HCC)    basal cell of right temple; carcinoids   Complication of anesthesia    hard to wake up after bronchoscopy   Diabetes (HCC)    type 2   Dyspnea    GERD (gastroesophageal reflux disease)    Gout    resolved, no current problem   Headache  History of hiatal hernia 05/16/2006   small noted on EGD   Hypertension    hx   Lung mass    Lymphoma in remission    last chemo 05/2017, in remission-chest, but all over   Memory loss    mild - r/t chemo, 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   Neuromuscular disorder (HCC)    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   Sleep apnea    does not use anything for OSA   Past Surgical History:  Procedure Laterality Date   BACK SURGERY     L4-L5 bracket and screws   BICEPS TENDON REPAIR Left    CARPAL TUNNEL RELEASE     COLONOSCOPY     CORONARY BALLOON ANGIOPLASTY N/A 06/10/2021   Procedure: CORONARY BALLOON ANGIOPLASTY;  Surgeon: Corky Crafts, MD;  Location: [facility] INVASIVE CV LAB;  Service: Cardiovascular;  Laterality: N/A;   CORONARY STENT INTERVENTION N/A 06/10/2021   Procedure: CORONARY STENT INTERVENTION;  Surgeon: Corky Crafts, MD;  [facility] INVAS[facility] CV LAB;  Service: Cardiovascular;  Laterality: N/A;   CORONARY ULTRASOUND/IVUS N/A 06/10/2021   Procedure: Intravascular Ultrasound/IVUS;  Surgeon: Corky Crafts, MD;  Location: [facility] INVASIVE CV LAB;  Service: Cardiovascular;  Laterality: N/A;   FINGER SURGERY     KNEE ARTHROSCOPY     ACL tear   LEFT HEART CATH AND CORONARY ANGIOGRAPHY N/A 06/10/2021    Procedure: LEFT HEART CATH AND CORONARY ANGIOGRAPHY;  Surgeon: Corky Crafts, MD;  Location: [facility] INVASIVE CV LAB;  Service: Cardiovascular;  Laterality: N/A;   LEFT HEART CATHETERIZATION WITH CORONARY ANGIOGRAM N/A 12/07/2010   Procedure: LEFT HEART CATHETERIZATION WITH CORONARY ANGIOGRAM;  Surgeon: Marykay Lex, MD;  Location: [facility] CATH LAB;  Service: Cardiovascular;  Laterality: N/A;   LUMBAR LAMINECTOMY/DECOMPRESSION MICRODISCECTOMY Right 12/09/2020   Procedure: MICRODISCECTOMY, RIGHT LUMBAR TWO-THREE, LUMBAR THREE-FOUR;  Surgeon: Tressie Stalker, MD;  Location: [facility] OR;  Service: Neurosurgery;  Laterality: Right;   PILONIDAL CYST EXCISION     PORT-A-CATH REMOVAL N/A 11/09/2017   Procedure: REMOVAL PORT-A-CATH;  Surgeon: Luretha Murphy, MD;  Location: [facility];  Service: General;  Laterality: N/A;  local   PORTACATH PLACEMENT Left 02/27/2017   Procedure: INSERTION PORT-A-CATH;  Surgeon: Luretha Murphy, MD;  Location: [facility] ORS;  Service: General;  Laterality: Left;   UPPER GASTROINTESTINAL ENDOSCOPY  05/16/2006   VIDEO BRONCHOSCOPY WITH ENDOBRONCHIAL ULTRASOUND N/A 01/24/2017   Procedure: VIDEO BRONCHOSCOPY WITH ENDOBRONCHIAL ULTRASOUND with TRANSBRONCHIAL BIOPSY;  Surgeon: Delight Ovens, MD;  Location: [facility] OR;  Service: Thoracic;  Laterality: N/A;   VIDEO MEDIASTINOSCOPY N/A 02/05/2017   Procedure: VIDEO MEDIASTINOSCOPY;  Surgeon: Delight Ovens, MD;  Location: [facility] OR;  Service: Thoracic;  Laterality: N/A;   No Known Allergies    Objective:    Physical Exam Vitals and nursing note reviewed.  Constitutional:      General: He is not in acute distress.    Appearance: Normal appearance.  HENT:     Head: Normocephalic.     Right Ear: Tympanic membrane and ear canal normal.     Left Ear: Tympanic membrane and ear canal normal.     Nose:     Right Sinus: Frontal sinus tenderness present. No maxillary sinus tenderness.     Left Sinus: Frontal sinus tenderness  present. No maxillary sinus tenderness.     Mouth/Throat:     Mouth: Mucous membranes are moist.     Pharynx: No pharyngeal swelling, oropharyngeal exudate, posterior  oropharyngeal erythema or uvula swelling.     Tonsils: No tonsillar exudate or tonsillar abscesses.  Cardiovascular:     Rate and Rhythm: Normal rate and regular rhythm.  Pulmonary:     Effort: Pulmonary effort is normal.     Breath sounds: Examination of the right-upper field reveals decreased breath sounds and wheezing. Examination of the left-upper field reveals decreased breath sounds and wheezing. Examination of the right-middle field reveals decreased breath sounds and wheezing. Examination of the left-middle field reveals decreased breath sounds and wheezing. Examination of the right-lower field reveals decreased breath sounds. Examination of the left-lower field reveals decreased breath sounds. Decreased breath sounds and wheezing (expiratory) present.  Musculoskeletal:        General: Normal range of motion.     Cervical back: Normal range of motion.  Lymphadenopathy:     Head:     Right side of head: No preauricular or posterior auricular adenopathy.     Left side of head: No preauricular or posterior auricular adenopathy.     Cervical: No cervical adenopathy.  Skin:    General: Skin is warm and dry.  Neurological:     Mental Status: He is alert and oriented to person, place, and time.  Psychiatric:        Mood and Affect: Mood normal.    BP 137/83 (BP Location: Left Arm, Patient Position: Sitting, Cuff Size: Large)   Pulse 82   Temp 97.8 F (36.6 C) (Temporal)   Ht 6\' 3"  (1.905 m)   Wt 244 lb 4 oz (110.8 kg)   SpO2 97%   BMI 30.53 kg/m  Wt Readings from Last 3 Encounters:  01/26/23 244 lb 4 oz (110.8 kg)  12/26/22 246 lb 3.2 oz (111.7 kg)  12/05/22 240 lb (108.9 kg)      Dulce Sellar, NP

## 2023-01-29 ENCOUNTER — Other Ambulatory Visit (HOSPITAL_COMMUNITY): Payer: Self-pay

## 2023-01-30 ENCOUNTER — Encounter: Payer: Self-pay | Admitting: Family

## 2023-01-31 ENCOUNTER — Other Ambulatory Visit (HOSPITAL_COMMUNITY): Payer: Self-pay

## 2023-02-01 NOTE — Telephone Encounter (Signed)
 no, he has been on 3 antibiotics already & he didn't want prednisone - give a few more days, is he using the nebulizer medication? keep using 2-3 x/day for breathing - f/u with Jimmey Ralph if sx persist or worsen - may need pulm referral

## 2023-02-01 NOTE — Telephone Encounter (Signed)
 See note

## 2023-02-05 ENCOUNTER — Other Ambulatory Visit (HOSPITAL_COMMUNITY): Payer: Self-pay

## 2023-02-17 ENCOUNTER — Other Ambulatory Visit (HOSPITAL_COMMUNITY): Payer: Self-pay

## 2023-02-19 ENCOUNTER — Other Ambulatory Visit: Payer: Self-pay

## 2023-02-25 ENCOUNTER — Other Ambulatory Visit: Payer: Self-pay | Admitting: Family Medicine

## 2023-02-26 ENCOUNTER — Other Ambulatory Visit: Payer: Self-pay

## 2023-02-26 MED ORDER — METFORMIN HCL 1000 MG PO TABS
1000.0000 mg | ORAL_TABLET | Freq: Two times a day (BID) | ORAL | 0 refills | Status: DC
  Filled 2023-02-26: qty 180, 90d supply, fill #0

## 2023-03-02 ENCOUNTER — Other Ambulatory Visit: Payer: Self-pay

## 2023-03-03 ENCOUNTER — Other Ambulatory Visit (HOSPITAL_COMMUNITY): Payer: Self-pay

## 2023-03-07 DIAGNOSIS — H5711 Ocular pain, right eye: Secondary | ICD-10-CM | POA: Diagnosis not present

## 2023-03-12 ENCOUNTER — Other Ambulatory Visit (HOSPITAL_COMMUNITY): Payer: Self-pay

## 2023-03-15 ENCOUNTER — Ambulatory Visit: Payer: Medicare PPO | Admitting: Physician Assistant

## 2023-03-17 ENCOUNTER — Other Ambulatory Visit: Payer: Self-pay | Admitting: Family Medicine

## 2023-03-17 ENCOUNTER — Other Ambulatory Visit (HOSPITAL_COMMUNITY): Payer: Self-pay

## 2023-03-17 ENCOUNTER — Other Ambulatory Visit: Payer: Self-pay | Admitting: Nurse Practitioner

## 2023-03-19 ENCOUNTER — Other Ambulatory Visit (HOSPITAL_COMMUNITY): Payer: Self-pay

## 2023-03-19 MED ORDER — TAMSULOSIN HCL 0.4 MG PO CAPS
0.4000 mg | ORAL_CAPSULE | Freq: Every day | ORAL | 1 refills | Status: DC
  Filled 2023-03-19: qty 90, 90d supply, fill #0
  Filled 2023-06-13: qty 90, 90d supply, fill #1

## 2023-03-20 ENCOUNTER — Other Ambulatory Visit (HOSPITAL_COMMUNITY): Payer: Self-pay

## 2023-03-20 ENCOUNTER — Other Ambulatory Visit: Payer: Self-pay

## 2023-03-20 ENCOUNTER — Ambulatory Visit: Payer: Medicare PPO | Admitting: Internal Medicine

## 2023-03-20 MED ORDER — ATORVASTATIN CALCIUM 40 MG PO TABS
40.0000 mg | ORAL_TABLET | Freq: Every day | ORAL | 2 refills | Status: DC
  Filled 2023-03-20: qty 90, 90d supply, fill #0
  Filled 2023-06-17: qty 90, 90d supply, fill #1
  Filled 2023-06-19 – 2023-09-11 (×2): qty 90, 90d supply, fill #2

## 2023-03-31 ENCOUNTER — Other Ambulatory Visit (HOSPITAL_COMMUNITY): Payer: Self-pay

## 2023-04-03 ENCOUNTER — Other Ambulatory Visit (HOSPITAL_COMMUNITY): Payer: Self-pay

## 2023-04-03 NOTE — Progress Notes (Signed)
 Cardiology Office Note:    Date:  04/04/2023  ID:  Jeffery Wood, DOB 15-Jan-1956, MRN [id number] PCP: Ardith Dark, MD  [facility] Providers Cardiologist:  Dietrich Pates, MD       Patient Profile:      Coronary artery disease  S/p 3.5 x 15 mm DES to mLAD, 2 x 30 mm DES to dLAD, POBA to oD1 in 05/2021 LHC 06/10/21: mLAD 80, dLAD 95, D1 90; dRPDA 100 w L-R collats; EF 55-65 Myoview 02/03/22: inf defect - mostly diaph atten w prob mild superimposed ischemia, EF 61; low risk  Diabetes mellitus  Hypertension  Hyperlipidemia  OSA Large Cell Lymphoma TTE 02/08/17: mild conc (LVH) Left Ventricular Hypertrophy, EF 60-65, Gr 1 DD Chronic Obstructive Pulmonary Disease          Discussed the use of AI scribe software for clinical note transcription with the patient, who gave verbal consent to proceed. History of Present Illness Jeffery Wood is a 68 y.o. male who returns for follow up of CAD. He was last seen by Dr. Tenny Craw in 08/2022.   He experiences occasional chest discomfort described as a 'little discomfort in my chest' that occurs occasionally and resolves quickly. These symptoms are improved compared to before his stent placement. He occasionally experiences these symptoms during emotional stress or excitement but not consistently with physical activity. He has used nitroglycerin a couple of times without significant relief.  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 He experiences cramps at night, which he manages with mustard, and notes that his balance has been affected since chemotherapy. No significant pain or claudication symptoms in his legs.  He recalls an episode two weeks ago where he woke up with chest pain and sweating, which resolved after a few minutes. He attributes some of his symptoms to his back pain, which has been persistent since his back surgery. He  describes his back pain as radiating to his shoulder and arm, which has been present since his cancer treatment. No recent episodes of passing out or significant changes in his symptoms.   Review of Systems  Gastrointestinal:  Negative for hematochezia and melena.  Genitourinary:  Negative for hematuria.  -See HPI    Studies Reviewed:   EKG Interpretation Date/Time:  Wednesday April 04 2023 08:47:35 EST Ventricular Rate:  81 PR Interval:  198 QRS Duration:  84 QT Interval:  382 QTC Calculation: 443 R Axis:   65  Text Interpretation: Normal sinus rhythm Low voltage QRS Cannot rule out Anterior infarct No significant change since last tracing Confirmed by Tereso Newcomer [phone]) on 04/04/2023 8:50:37 AM   Results LABS - Chart Review Total cholesterol: 111 (11/15/2022) HDL: 43.7 (11/15/2022) LDL: 55 (11/15/2022) Triglyceride: 62 (11/15/2022) Creatinine: 0.71 (11/15/2022) Potassium: 4.3 (11/15/2022) ALT: 18 (11/15/2022) Hemoglobin: 15.2 (01/26/2023)    Risk Assessment/Calculations:             Physical Exam:   VS:  BP 130/80   Pulse 78   Ht 6\' 3"  (1.905 m)   Wt 247 lb 9.6 oz (112.3 kg)   SpO2 96%   BMI 30.95 kg/m    Wt Readings from Last 3 Encounters:  04/04/23 247 lb 9.6 oz (112.3 kg)  01/26/23 244 lb 4 oz (110.8 kg)  12/26/22 246 lb 3.2 oz (111.7 kg)    Constitutional:      Appearance: Healthy appearance. Not in  distress.  Neck:     Vascular: No JVR. JVD normal.  Pulmonary:     Breath sounds: Normal breath sounds. No wheezing. No rales.  Cardiovascular:     Normal rate. Regular rhythm.     Murmurs: There is no murmur.  Pulses:    Comments: DP/PT 2+ bilat Edema:    Peripheral edema absent.  Abdominal:     Palpations: Abdomen is soft.        Assessment and Plan:   Assessment & Plan Coronary Artery Disease with Angina Coronary artery disease status post drug-eluting stents to the mid and distal LAD and balloon angioplasty to the diagonal in May 2023.  Residual disease includes a 100% occluded distal RPDA with good left to right collaterals. Nuclear stress test in January 2024 demonstrated an inferior defect, mostly due to diaphragmatic attenuation with probable mild superimposed ischemia, overall low risk.  The patient reports chronic anginal symptoms with occasional chest discomfort.  Overall his chest symptoms have improved since stenting. Recently he experienced brief substernal chest discomfort with diaphoresis two weeks ago.  This awoke him from sleep.  He did not take nitroglycerin or seek medical attention. - Arrange stress PET to assess for significant ischemia. - Arrange echocardiogram to evaluate EF and rule out wall motion abnormalities. - Follow up in three months or sooner if stress testing and/or echocardiogram are abnormal. - Continue atorvastatin 40 mg daily. - Continue clopidogrel 75 mg daily. - Continue nitroglycerin as needed. - Increase isosorbide mononitrate to 30 mg daily.  Hyperlipidemia LDL cholesterol is optimal with current atorvastatin therapy. Recent lab results: total cholesterol 111 mg/dL, HDL 82.9 mg/dL, LDL 55 mg/dL, triglycerides 62 mg/dL. - Continue atorvastatin 40 mg daily.  Hypertension Blood pressure is well controlled. Isosorbide mononitrate is also used for anti-anginal control. - Increase isosorbide mononitrate to 30 mg daily for anti-anginal control.  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 No symptoms consistent with claudication at this time. - If symptoms become consistent with claudication, perform ankle-brachial index (ABI) testing.     Informed Consent   Shared Decision Making/Informed Consent The risks [chest pain, shortness of breath, cardiac arrhythmias, dizziness, blood pressure fluctuations, myocardial infarction, stroke/transient ischemic attack, nausea, vomiting, allergic reaction, radiation exposure, metallic  taste sensation and life-threatening complications (estimated to be 1 in 10,000)], benefits (risk stratification, diagnosing coronary artery disease, treatment guidance) and alternatives of a cardiac PET stress test were discussed in detail with Mr. Quadros and he agrees to proceed.     Dispo:  Return in about 3 months (around 07/05/2023) for Routine Follow Up, w/ Dr. Tenny Craw, or Tereso Newcomer, PA-C.  Signed, Tereso Newcomer, PA-C

## 2023-04-04 ENCOUNTER — Ambulatory Visit: Payer: Medicare PPO | Attending: Physician Assistant | Admitting: Physician Assistant

## 2023-04-04 ENCOUNTER — Other Ambulatory Visit (HOSPITAL_COMMUNITY): Payer: Self-pay

## 2023-04-04 ENCOUNTER — Encounter: Payer: Self-pay | Admitting: Physician Assistant

## 2023-04-04 VITALS — BP 130/80 | HR 78 | Ht 75.0 in | Wt 247.6 lb

## 2023-04-04 DIAGNOSIS — I25119 Atherosclerotic heart disease of native coronary artery with unspecified angina pectoris: Secondary | ICD-10-CM

## 2023-04-04 DIAGNOSIS — R29898 Other symptoms and signs involving the musculoskeletal system: Secondary | ICD-10-CM

## 2023-04-04 DIAGNOSIS — I2510 Atherosclerotic heart disease of native coronary artery without angina pectoris: Secondary | ICD-10-CM

## 2023-04-04 DIAGNOSIS — R079 Chest pain, unspecified: Secondary | ICD-10-CM

## 2023-04-04 DIAGNOSIS — I10 Essential (primary) hypertension: Secondary | ICD-10-CM

## 2023-04-04 DIAGNOSIS — E785 Hyperlipidemia, unspecified: Secondary | ICD-10-CM

## 2023-04-04 MED ORDER — ISOSORBIDE MONONITRATE ER 30 MG PO TB24
30.0000 mg | ORAL_TABLET | Freq: Every day | ORAL | 3 refills | Status: DC
  Filled 2023-04-04 – 2023-05-18 (×4): qty 90, 90d supply, fill #0
  Filled 2023-08-12: qty 90, 90d supply, fill #1
  Filled 2023-11-18: qty 90, 90d supply, fill #2
  Filled 2024-02-13: qty 90, 90d supply, fill #3

## 2023-04-04 NOTE — Patient Instructions (Addendum)
 Medication Instructions:  Your physician has recommended you make the following change in your medication:   INCREASE the Isosorbide (Imdur) to 30 mg taking 1 a day  *If you need a refill on your cardiac medications before your next appointment, please call your pharmacy*   Lab Work: None ordered  If you have labs (blood work) drawn today and your tests are completely normal, you will receive your results only by: MyChart Message (if you have MyChart) OR A paper copy in the mail If you have any lab test that is abnormal or we need to change your treatment, we will call you to review the results.   Testing/Procedures: Your physician has requested that you have an echocardiogram. Echocardiography is a painless test that uses sound waves to create images of your heart. It provides your doctor with information about the size and shape of your heart and how well your heart's chambers and valves are working. This procedure takes approximately one hour. There are no restrictions for this procedure. Please do NOT wear cologne, perfume, aftershave, or lotions (deodorant is allowed). Please arrive 15 minutes prior to your appointment time.  Please note: We ask at that you not bring children with you during ultrasound (echo/ vascular) testing. Due to room size and safety concerns, children are not allowed in the ultrasound rooms during exams. Our front office staff cannot provide observation of children in our lobby area while testing is being conducted. An adult accompanying a patient to their appointment will only be allowed in the ultrasound room at the discretion of the ultrasound technician under special circumstances. We apologize for any inconvenience.      Please report to Radiology at the [facility] Main Entrance 30 minutes early for your test.  [address] [place], [place] [address]                         OR   Please report to Radiology at [facility] Main Entrance, medical mall, 30 mins prior to your test.  [address]  [place], [place]  How to Prepare for Your Cardiac PET/CT Stress Test:  Nothing to eat or drink, except water, 3 hours prior to arrival time.  NO caffeine/decaffeinated products, or chocolate 12 hours prior to arrival. (Please note decaffeinated beverages (teas/coffees) still contain caffeine).  If you have caffeine within 12 hours prior, the test will need to be rescheduled.  Medication instructions: Do not take erectile dysfunction medications for 72 hours prior to test (sildenafil, tadalafil) Do not take nitrates (isosorbide mononitrate, the day before or day of test Do not take tamsulosin the day before or morning of test Hold theophylline containing medications for 12 hours. Hold Dipyridamole 48 hours prior to the test.  Diabetic Preparation: If able to eat breakfast prior to 3 hour fasting, you may take all medications, including your insulin. Do not worry if you miss your breakfast dose of insulin - start at your next meal. If you do not eat prior to 3 hour fast-Hold all diabetes (oral and insulin) medications. Patients who wear a continuous glucose monitor MUST remove the device prior to scanning.  You may take your remaining medications with water.  NO perfume, cologne or lotion on chest or abdomen area.  Total time is 1 to 2 hours; you may want to bring reading material for the waiting time.  IF YOU THINK YOU MAY BE PREGNANT, OR ARE NURSING PLEASE  INFORM THE TECHNOLOGIST.  In preparation for your appointment, medication and supplies will be purchased.  Appointment availability is limited, so if you need to cancel or reschedule, please call the Radiology Department Scheduler at [phone] 24 hours in advance to avoid a cancellation fee of $100.00  What to Expect When you Arrive:  Once you arrive and check in for your appointment, you will be taken to a preparation room within the  Radiology Department.  A technologist or Nurse will obtain your medical history, verify that you are correctly prepped for the exam, and explain the procedure.  Afterwards, an IV will be started in your arm and electrodes will be placed on your skin for EKG monitoring during the stress portion of the exam. Then you will be escorted to the PET/CT scanner.  There, staff will get you positioned on the scanner and obtain a blood pressure and EKG.  During the exam, you will continue to be connected to the EKG and blood pressure machines.  A small, safe amount of a radioactive tracer will be injected in your IV to obtain a series of pictures of your heart along with an injection of a stress agent.    After your Exam:  It is recommended that you eat a meal and drink a caffeinated beverage to counter act any effects of the stress agent.  Drink plenty of fluids for the remainder of the day and urinate frequently for the first couple of hours after the exam.  Your doctor will inform you of your test results within 7-10 business days.  For more information and frequently asked questions, please visit our website: [email or web address]  For questions about your test or how to prepare for your test, please call: Cardiac Imaging Nurse Navigators Office: [phone]    Follow-Up: At [facility], you and your health needs are our priority.  As part of our continuing mission to provide you with exceptional heart care, we have created designated Provider Care Teams.  These Care Teams include your primary Cardiologist (physician) and Advanced Practice Providers (APPs -  Physician Assistants and Nurse Practitioners) who all work together to provide you with the care you need, when you need it.  We recommend signing up for the patient portal called "MyChart".  Sign up information is provided on this After Visit Summary.  MyChart is used to connect with patients for Virtual Visits (Telemedicine).   Patients are able to view lab/test results, encounter notes, upcoming appointments, etc.  Non-urgent messages can be sent to your provider as well.   To learn more about what you can do with MyChart, go to [email or web address].    Your next appointment:   3 month(s)  Provider:   Dietrich Pates, MD   or Tereso Newcomer  Other Instructions     1st Floor: - Lobby - Registration  - Pharmacy  - Lab - Cafe  2nd Floor: - PV Lab - Diagnostic Testing (echo, CT, nuclear med)  3rd Floor: - Vacant  4th Floor: - TCTS (cardiothoracic surgery) - AFib Clinic - Structural Heart Clinic - Vascular Surgery  - Vascular Ultrasound  5th Floor: - [facility] (general and EP) - Clinical Pharmacy for coumadin, hypertension, lipid, weight-loss medications, and med management appointments    Valet parking services will be available as well.

## 2023-04-07 ENCOUNTER — Other Ambulatory Visit (HOSPITAL_COMMUNITY): Payer: Self-pay

## 2023-04-09 ENCOUNTER — Other Ambulatory Visit (HOSPITAL_COMMUNITY): Payer: Self-pay

## 2023-04-10 ENCOUNTER — Other Ambulatory Visit (HOSPITAL_COMMUNITY): Payer: Self-pay

## 2023-04-10 DIAGNOSIS — M4316 Spondylolisthesis, lumbar region: Secondary | ICD-10-CM | POA: Diagnosis not present

## 2023-04-10 DIAGNOSIS — Z6830 Body mass index (BMI) 30.0-30.9, adult: Secondary | ICD-10-CM | POA: Diagnosis not present

## 2023-04-10 MED ORDER — TRAMADOL HCL 50 MG PO TABS
50.0000 mg | ORAL_TABLET | Freq: Three times a day (TID) | ORAL | 1 refills | Status: DC | PRN
  Filled 2023-04-10: qty 50, 17d supply, fill #0
  Filled 2023-05-26: qty 50, 17d supply, fill #1

## 2023-04-10 MED ORDER — METHYLPREDNISOLONE 4 MG PO TBPK
ORAL_TABLET | ORAL | 0 refills | Status: DC
  Filled 2023-04-10: qty 21, 6d supply, fill #0

## 2023-04-10 MED ORDER — CYCLOBENZAPRINE HCL 10 MG PO TABS
10.0000 mg | ORAL_TABLET | Freq: Three times a day (TID) | ORAL | 1 refills | Status: AC | PRN
  Filled 2023-04-10: qty 50, 17d supply, fill #0
  Filled 2024-01-28: qty 50, 17d supply, fill #1

## 2023-04-10 MED ORDER — HYDROCODONE-ACETAMINOPHEN 5-325 MG PO TABS
1.0000 | ORAL_TABLET | ORAL | 0 refills | Status: DC | PRN
  Filled 2023-04-10: qty 30, 5d supply, fill #0

## 2023-04-18 ENCOUNTER — Ambulatory Visit (INDEPENDENT_AMBULATORY_CARE_PROVIDER_SITE_OTHER): Payer: Medicare PPO

## 2023-04-18 VITALS — Ht 75.0 in | Wt 247.0 lb

## 2023-04-18 DIAGNOSIS — Z0000 Encounter for general adult medical examination without abnormal findings: Secondary | ICD-10-CM

## 2023-04-18 NOTE — Patient Instructions (Signed)
 [name] , Thank you for taking time to come for your Medicare Wellness Visit. I appreciate your ongoing commitment to your health goals. Please review the following plan we discussed and let me know if I can assist you in the future.   Referrals/Orders/Follow-Ups/Clinician Recommendations: Each day, aim for 6 glasses of water, plenty of protein in your diet and try to get up and walk/ stretch every hour for 5-10 minutes at a time.  Continue to stay active   This is a list of the screening recommended for you and due dates:  Health Maintenance  Topic Date Due   Complete foot exam   Never done   Eye exam for diabetics  03/29/2022   Medicare Annual Wellness Visit  01/04/2023   Hemoglobin A1C  05/16/2023   Yearly kidney function blood test for diabetes  11/15/2023   Yearly kidney health urinalysis for diabetes  11/15/2023   DTaP/Tdap/Td vaccine (2 - Td or Tdap) 10/10/2028   Colon Cancer Screening  01/10/2033   Pneumonia Vaccine  Completed   Flu Shot  Completed   Hepatitis C Screening  Completed   Zoster (Shingles) Vaccine  Completed   HPV Vaccine  Aged Out   COVID-19 Vaccine  Discontinued    Advanced directives: (Copy Requested) Please bring a copy of your health care power of attorney and living will to the office to be added to your chart at your convenience. You can mail to [facility] [address] [place], [place] [address] or email to [email or web address]  Next Medicare Annual Wellness Visit scheduled for next year: Yes

## 2023-04-18 NOTE — Progress Notes (Signed)
 Subjective:   Jeffery Wood is a 69 y.o. who presents for a Medicare Wellness preventive visit.  Visit Complete: Virtual I connected with  Jeffery Wood on 04/18/23 by a audio enabled telemedicine application and verified that I am speaking with the correct person using two identifiers.  Patient Location: Home  Provider Location: Home Office  I discussed the limitations of evaluation and management by telemedicine. The patient expressed understanding and agreed to proceed.  Vital Signs: Because this visit was a virtual/telehealth visit, some criteria may be missing or patient reported. Any vitals not documented were not able to be obtained and vitals that have been documented are patient reported.  VideoDeclined- This patient declined Librarian, academic. Therefore the visit was completed with audio only.  Persons Participating in Visit: Patient.  AWV Questionnaire: No: Patient Medicare AWV questionnaire was not completed prior to this visit.  Cardiac Risk Factors include: advanced age (>72men, >29 women);hypertension;dyslipidemia;diabetes mellitus;male gender;obesity (BMI >30kg/m2)     Objective:    Today's Vitals   04/18/23 0806  Weight: 247 lb (112 kg)  Height: 6\' 3"  (1.905 m)  PainSc: 5    Body mass index is 30.87 kg/m.     04/18/2023    8:13 AM 12/05/2022    8:11 AM 11/17/2022    8:08 AM 11/06/2022    8:08 AM 10/31/2022    2:33 PM 05/04/2022    8:10 AM 01/03/2022    8:07 AM  Advanced Directives  Does Patient Have a Medical Advance Directive? Yes Yes Yes No No Yes Yes  Type of Estate agent of [place];Living will Healthcare Power of [employer] of [place];Living will Living will;Healthcare Power of [employer] Power of [place];Living will Healthcare Power of [place];Living will  Does patient want to make changes to medical advance directive?  No - Patient declined No - Patient declined No -  Patient declined  No - Patient declined   Copy of Healthcare Power of Attorney in Chart? No - copy requested No - copy requested    No - copy requested No - copy requested  Would patient like information on creating a medical advance directive?    No - Patient declined       Current Medications (verified) Outpatient Encounter Medications as of 04/18/2023  Medication Sig   ALBUTEROL SULFATE HFA IN Inhale into the lungs. AS DIRECTED PRN   atorvastatin (LIPITOR) 40 MG tablet Take 1 tablet (40 mg total) by mouth daily.   augmented betamethasone dipropionate (DIPROLENE-AF) 0.05 % cream Apply 1 Application topically to affected areas on skin as directed.   azelastine (ASTELIN) 0.1 % nasal spray Place 2 sprays into both nostrils 2 (two) times daily.   celecoxib (CELEBREX) 200 MG capsule Take 1 capsule (200 mg total) by mouth 2 (two) times daily as needed.   ciclopirox (PENLAC) 8 % solution Apply topically at bedtime. Apply over nail and surrounding skin. Apply daily over previous coat. After seven (7) days, may remove with alcohol and continue cycle.   clopidogrel (PLAVIX) 75 MG tablet Take 1 tablet (75 mg total) by mouth daily with breakfast.   cyclobenzaprine (FLEXERIL) 10 MG tablet Take 1 tablet (10 mg total) by mouth 3 (three) times daily as needed for muscle spasms.   Fluticasone-Umeclidin-Vilant (TRELEGY ELLIPTA) 100-62.5-25 MCG/ACT AEPB Inhale 1 puff then rinse mouth, once daily   gabapentin (NEURONTIN) 300 MG capsule Take 4 capsules (1,200 mg total) by mouth 3 (three) times daily.  glimepiride (AMARYL) 4 MG tablet Take 0.5 tablets (2 mg total) by mouth daily before largest meal of the day.   HYDROcodone-acetaminophen (NORCO/VICODIN) 5-325 MG tablet Take 1 tablet by mouth every 4 (four) hours as needed for pain.   isosorbide mononitrate (IMDUR) 30 MG 24 hr tablet Take 1 tablet (30 mg total) by mouth daily.   levalbuterol (XOPENEX HFA) 45 MCG/ACT inhaler Inhale 2 puffs into the lungs daily as  needed for wheezing or shortness of breath.   loratadine (CLARITIN) 10 MG tablet Take 10 mg by mouth daily.   metFORMIN (GLUCOPHAGE) 1000 MG tablet Take 1 tablet (1,000 mg total) by mouth 2 (two) times daily.   metoCLOPramide (REGLAN) 10 MG tablet Take 1 tablet (10 mg total) by mouth every 6 (six) hours as needed for nausea.   Multiple Vitamin (MULTIVITAMIN) capsule Take 1 capsule by mouth daily.   nitroGLYCERIN (NITROSTAT) 0.4 MG SL tablet Place 1 tablet (0.4 mg total) under the tongue every 5 (five) minutes as needed for chest pain. If no relief after 3 doses call 911.   OVER THE COUNTER MEDICATION Take 1 tablet by mouth daily. [employer]   oxyCODONE-acetaminophen (PERCOCET/ROXICET) 5-325 MG tablet Take 1 - 2 tablets by mouth every 6 hours as needed for severe pain.   pantoprazole (PROTONIX) 40 MG tablet Take 1 tablet (40 mg total) by mouth daily.   polyethylene glycol-electrolytes (NULYTELY) 420 g solution Use as Directed   tamsulosin (FLOMAX) 0.4 MG CAPS capsule Take 1 capsule (0.4 mg) by mouth daily.   traMADol (ULTRAM) 50 MG tablet Take 1 tablet (50 mg total) by mouth every 8 (eight) hours as needed   traZODone (DESYREL) 50 MG tablet Take 1/2 - 1 tablet (25 - 50 mg) by mouth at bedtime as needed for sleep.   cyclobenzaprine (FLEXERIL) 10 MG tablet Take 1 tablet (10 mg total) by mouth 3 (three) times daily as needed for muscle spasms. (Patient not taking: Reported on 04/18/2023)   ipratropium-albuterol (DUONEB) 0.5-2.5 (3) MG/3ML SOLN Take 3 mLs by nebulization every 6 (six) hours as needed.   traMADol (ULTRAM) 50 MG tablet Take 1 tablet (50 mg total) by mouth every 8 (eight) hours as needed. (Patient not taking: Reported on 04/18/2023)   [DISCONTINUED] bisacodyl (DULCOLAX) 5 MG EC tablet Take by mouth as directed.   [DISCONTINUED] levofloxacin (LEVAQUIN) 750 MG tablet Take 1 tablet (750 mg total) by mouth daily.   [DISCONTINUED] methylPREDNISolone (MEDROL DOSEPAK) 4 MG TBPK tablet Take by  mouth as directed   No facility-administered encounter medications on file as of 04/18/2023.    Allergies (verified) Patient has no known allergies.   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, in remission-chest, but all over   Memory loss  mild - r/t chemo, 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   Neuromuscular disorder (HCC)    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   Sleep apnea    does not use anything for OSA   Past Surgical History:  Procedure Laterality Date   BACK SURGERY     L4-L5 bracket and screws   BICEPS TENDON REPAIR Left    CARPAL TUNNEL RELEASE     COLONOSCOPY     CORONARY BALLOON ANGIOPLASTY N/A 06/10/2021   Procedure: CORONARY BALLOON ANGIOPLASTY;  Surgeon: Corky Crafts, MD;  Location: [facility] INVASIVE CV LAB;  Service: Cardiovascular;  Laterality: N/A;   CORONARY STENT INTERVENTION N/A 06/10/2021   Procedure: CORONARY STENT INTERVENTION;  Surgeon: Corky Crafts, MD;  Location: [facility] INVASIVE CV LAB;  Service: Cardiovascular;  Laterality: N/A;   CORONARY ULTRASOUND/IVUS N/A 06/10/2021   Procedure: Intravascular Ultrasound/IVUS;  Surgeon: Corky Crafts, MD;  Location: [facility] INVASIVE CV LAB;  Service: Cardiovascular;  Laterality: N/A;   FINGER SURGERY     KNEE ARTHROSCOPY     ACL tear   LEFT HEART CATH AND CORONARY ANGIOGRAPHY N/A 06/10/2021   Procedure: LEFT HEART CATH AND CORONARY ANGIOGRAPHY;  Surgeon: Corky Crafts, MD;   Location: [facility] INVASIVE CV LAB;  Service: Cardiovascular;  Laterality: N/A;   LEFT HEART CATHETERIZATION WITH CORONARY ANGIOGRAM N/A 12/07/2010   Procedure: LEFT HEART CATHETERIZATION WITH CORONARY ANGIOGRAM;  Surgeon: Marykay Lex, MD;  Location: [facility] CATH LAB;  Service: Cardiovascular;  Laterality: N/A;   LUMBAR LAMINECTOMY/DECOMPRESSION MICRODISCECTOMY Right 12/09/2020   Procedure: MICRODISCECTOMY, RIGHT LUMBAR TWO-THREE, LUMBAR THREE-FOUR;  Surgeon: Tressie Stalker, MD;  Location: [facility];  Service: Neurosurgery;  Laterality: Right;   PILONIDAL CYST EXCISION     PORT-A-CATH REMOVAL N/A 11/09/2017   Procedure: REMOVAL PORT-A-CATH;  Surgeon: Luretha Murphy, MD;  Location: [facility];  Service: General;  Laterality: N/A;  local   PORTACATH PLACEMENT Left 02/27/2017   Procedure: INSERTION PORT-A-CATH;  Surgeon: Luretha Murphy, MD;  Location: [facility] ORS;  Service: General;  Laterality: Left;   UPPER GASTROINTESTINAL ENDOSCOPY  05/16/2006   VIDEO BRONCHOSCOPY WITH ENDOBRONCHIAL ULTRASOUND N/A 01/24/2017   Procedure: VIDEO BRONCHOSCOPY WITH ENDOBRONCHIAL ULTRASOUND with TRANSBRONCHIAL BIOPSY;  Surgeon: Delight Ovens, MD;  Location: [facility] OR;  Service: Thoracic;  Laterality: N/A;   VIDEO MEDIASTINOSCOPY N/A 02/05/2017   Procedure: VIDEO MEDIASTINOSCOPY;  Surgeon: Delight Ovens, MD;  Location: [facility] OR;  Service: Thoracic;  Laterality: N/A;   Family History  Problem Relation Age of Onset   Emphysema Mother    Asthma Mother    Heart disease Father    Social History   Socioeconomic History   Marital status: Married    Spouse name: Not on file   Number of children: 2   Years of education: Not on file   Highest education level: Some college, no degree  Occupational History   Occupation: retired  Tobacco Use   Smoking status: Former    Current packs/day: 0.00    Average packs/day: 3.0 packs/day for 20.0 years (60.0 ttl pk-yrs)    Types: Cigarettes    Start date: 01/31/1968     Quit date: 01/31/1988    Years since quitting: 35.2   Smokeless tobacco: Never  Vaping Use   Vaping status: Never Used  Substance and Sexual Activity   Alcohol use: No   Drug use: No   Sexual activity: Yes    Partners: Female    Birth control/protection: None  Other  Topics Concern   Not on file  Social History Narrative   1 grand and 1 due 08/2021   2 children   Drinks monsters daily   Lives with wife   One floor home   Left handed   Social Drivers of Health   Financial Resource Strain: Low Risk  (04/18/2023)   Overall Financial Resource Strain (CARDIA)    Difficulty of Paying Living Expenses: Not hard at all  Food Insecurity: No Food Insecurity (04/18/2023)   Hunger Vital Sign    Worried About Running Out of Food in the Last Year: Never true    Ran Out of Food in the Last Year: Never true  Transportation Needs: No Transportation Needs (04/18/2023)   PRAPARE - Administrator, Civil Service (Medical): No    Lack of Transportation (Non-Medical): No  Physical Activity: Inactive (04/18/2023)   Exercise Vital Sign    Days of Exercise per Week: 0 days    Minutes of Exercise per Session: 0 min  Stress: No Stress Concern Present (04/18/2023)   [employer] of Occupational Health - Occupational Stress Questionnaire    Feeling of Stress : Not at all  Social Connections: Moderately Isolated (04/18/2023)   Social Connection and Isolation Panel [NHANES]    Frequency of Communication with Friends and Family: More than three times a week    Frequency of Social Gatherings with Friends and Family: More than three times a week    Attends Religious Services: Never    Database administrator or Organizations: No    Attends Engineer, structural: Never    Marital Status: Married    Tobacco Counseling Counseling given: Not Answered    Clinical Intake:  Pre-visit preparation completed: Yes  Pain : 0-10 Pain Score: 5  Pain Type: Chronic pain Pain Location:  Back Pain Orientation: Lower Pain Descriptors / Indicators: Aching, Sharp Pain Onset: More than a month ago Pain Frequency: Constant     BMI - recorded: 30.87 Nutritional Status: BMI > 30  Obese Nutritional Risks: None Diabetes: Yes CBG done?: No Did pt. bring in CBG monitor from home?: No  Lab Results  Component Value Date   HGBA1C 5.8 11/15/2022   HGBA1C 5.7 (A) 05/15/2022   HGBA1C 5.9 11/10/2021     How often do you need to have someone help you when you read instructions, pamphlets, or other written materials from your doctor or pharmacy?: 1 - Never  Interpreter Needed?: No  Information entered by :: Lanier Ensign, LPN   Activities of Daily Living     04/18/2023    8:08 AM 01/16/2023    8:53 AM  In your present state of health, do you have any difficulty performing the following activities:  Hearing? 1 0  Comment diminished hearing   Vision? 0 0  Difficulty concentrating or making decisions? 0 0  Walking or climbing stairs? 0 0  Dressing or bathing? 0 0  Doing errands, shopping? 0 0  Preparing Food and eating ? N N  Using the Toilet? N N  In the past six months, have you accidently leaked urine? N   Do you have problems with loss of bowel control? N   Managing your Medications? N N  Managing your Finances? N N  Housekeeping or managing your Housekeeping? N N    Patient Care Team: Ardith Dark, MD as PCP - General (Family Medicine) Pricilla Riffle, MD as PCP - Cardiology (Cardiology) Blima Ledger, OD (Optometry)  Indicate any recent Medical Services you may have received from other than [facility] providers in the past year (date may be approximate).     Assessment:   This is a routine wellness examination for [place].  Hearing/Vision screen Hearing Screening - Comments:: Pt stated diminished hearing  Vision Screening - Comments:: Pt [facility]   Goals Addressed             This Visit's Progress    Patient  Stated       Stay active       Depression Screen     04/18/2023    8:13 AM 12/26/2022   10:40 AM 11/15/2022    7:57 AM 05/15/2022    7:20 AM 01/06/2022    7:26 AM 01/03/2022    8:00 AM 11/10/2021    7:48 AM  PHQ 2/9 Scores  PHQ - 2 Score 0 0 3 0 0 1 0  PHQ- 9 Score 0 0 6        Fall Risk     04/18/2023    8:15 AM 01/16/2023    8:53 AM 12/26/2022   10:41 AM 12/05/2022    8:11 AM 11/15/2022    7:57 AM  Fall Risk   Falls in the past year? 0 0 0 1 1  Number falls in past yr: 0  0 1 1  Injury with Fall? 0  0 1 0  Risk for fall due to : No Fall Risks  No Fall Risks  No Fall Risks  Follow up Falls prevention discussed   Falls evaluation completed     MEDICARE RISK AT HOME:  Medicare Risk at Home Any stairs in or around the home?: Yes If so, are there any without handrails?: Yes Home free of loose throw rugs in walkways, pet beds, electrical cords, etc?: Yes Adequate lighting in your home to reduce risk of falls?: Yes Life alert?: No Use of a cane, walker or w/c?: No Grab bars in the bathroom?: No Shower chair or bench in shower?: No Elevated toilet seat or a handicapped toilet?: No  TIMED UP AND GO:  Was the test performed?  No  Cognitive Function: 6CIT completed      12/05/2022   12:00 PM  Montreal Cognitive Assessment   Visuospatial/ Executive (0/5) 4  Naming (0/3) 3  Attention: Read list of digits (0/2) 2  Attention: Read list of letters (0/1) 1  Attention: Serial 7 subtraction starting at 100 (0/3) 3  Language: Repeat phrase (0/2) 1  Language : Fluency (0/1) 0  Abstraction (0/2) 0  Delayed Recall (0/5) 3  Orientation (0/6) 6  Total 23  Adjusted Score (based on education) 23      04/18/2023    8:16 AM 01/03/2022    8:10 AM  6CIT Screen  What Year? 0 points 0 points  What month? 0 points 0 points  What time? 0 points 0 points  Count back from 20 0 points 0 points  Months in reverse 0 points 0 points  Repeat phrase 0 points 2 points  Total Score 0  points 2 points    Immunizations Immunization History  Administered Date(s) Administered   Fluad Quad(high Dose 65+) 11/10/2021   Fluad Trivalent(High Dose 65+) 11/03/2022   Influenza,inj,Quad PF,6+ Mos 10/11/2018, 12/16/2019, 11/08/2020   Influenza-Unspecified 11/14/2016, 10/30/2017   Janssen (J&J) SARS-COV-2 Vaccination 05/08/2019, 12/03/2019   PNEUMOCOCCAL CONJUGATE-20 04/04/2021   Pneumococcal Conjugate-13 11/27/2017   Tdap 10/11/2018  Zoster Recombinant(Shingrix) 04/04/2021, 06/01/2021    Screening Tests Health Maintenance  Topic Date Due   FOOT EXAM  Never done   OPHTHALMOLOGY EXAM  03/29/2022   HEMOGLOBIN A1C  05/16/2023   Diabetic kidney evaluation - eGFR measurement  11/15/2023   Diabetic kidney evaluation - Urine ACR  11/15/2023   Medicare Annual Wellness (AWV)  04/17/2024   DTaP/Tdap/Td (2 - Td or Tdap) 10/10/2028   Colonoscopy  01/10/2033   Pneumonia Vaccine 2+ Years old  Completed   INFLUENZA VACCINE  Completed   Hepatitis C Screening  Completed   Zoster Vaccines- Shingrix  Completed   HPV VACCINES  Aged Out   COVID-19 Vaccine  Discontinued    Health Maintenance  Health Maintenance Due  Topic Date Due   FOOT EXAM  Never done   OPHTHALMOLOGY EXAM  03/29/2022   Health Maintenance Items Addressed: See Nurse Notes  Additional Screening:  Vision Screening: Recommended annual ophthalmology exams for early detection of glaucoma and other disorders of the eye.  Dental Screening: Recommended annual dental exams for proper oral hygiene  Community Resource Referral / Chronic Care Management: CRR required this visit?  No   CCM required this visit?  No     Plan:     I have personally reviewed and noted the following in the patient's chart:   Medical and social history Use of alcohol, tobacco or illicit drugs  Current medications and supplements including opioid prescriptions. Patient is currently taking opioid prescriptions. Information provided to  patient regarding non-opioid alternatives. Patient advised to discuss non-opioid treatment plan with their provider. Functional ability and status Nutritional status Physical activity Advanced directives List of other physicians Hospitalizations, surgeries, and ER visits in previous 12 months Vitals Screenings to include cognitive, depression, and falls Referrals and appointments  In addition, I have reviewed and discussed with patient certain preventive protocols, quality metrics, and best practice recommendations. A written personalized care plan for preventive services as well as general preventive health recommendations were provided to patient.     Marzella Schlein, LPN   [id number]/[id number]   After Visit Summary: (MyChart) Due to this being a telephonic visit, the after visit summary with patients personalized plan was offered to patient via MyChart   Notes: Nothing significant to report at this time.

## 2023-04-19 ENCOUNTER — Other Ambulatory Visit: Payer: Self-pay

## 2023-04-19 ENCOUNTER — Other Ambulatory Visit (HOSPITAL_COMMUNITY): Payer: Self-pay

## 2023-04-24 ENCOUNTER — Encounter: Payer: Self-pay | Admitting: Podiatry

## 2023-04-25 ENCOUNTER — Other Ambulatory Visit (HOSPITAL_COMMUNITY): Payer: Self-pay

## 2023-04-26 ENCOUNTER — Other Ambulatory Visit: Payer: Self-pay | Admitting: *Deleted

## 2023-04-26 ENCOUNTER — Encounter: Payer: Self-pay | Admitting: Physician Assistant

## 2023-04-26 ENCOUNTER — Ambulatory Visit (HOSPITAL_COMMUNITY): Attending: Cardiology

## 2023-04-26 DIAGNOSIS — R079 Chest pain, unspecified: Secondary | ICD-10-CM | POA: Insufficient documentation

## 2023-04-26 DIAGNOSIS — I10 Essential (primary) hypertension: Secondary | ICD-10-CM | POA: Diagnosis not present

## 2023-04-26 DIAGNOSIS — I25119 Atherosclerotic heart disease of native coronary artery with unspecified angina pectoris: Secondary | ICD-10-CM | POA: Insufficient documentation

## 2023-04-26 DIAGNOSIS — I77810 Thoracic aortic ectasia: Secondary | ICD-10-CM | POA: Insufficient documentation

## 2023-04-26 DIAGNOSIS — E785 Hyperlipidemia, unspecified: Secondary | ICD-10-CM | POA: Insufficient documentation

## 2023-04-26 HISTORY — DX: Thoracic aortic ectasia: I77.810

## 2023-04-26 LAB — ECHOCARDIOGRAM COMPLETE
Area-P 1/2: 3.48 cm2
S' Lateral: 2.6 cm

## 2023-04-27 ENCOUNTER — Encounter: Payer: Self-pay | Admitting: Oncology

## 2023-04-27 ENCOUNTER — Telehealth: Payer: Self-pay

## 2023-04-27 NOTE — Telephone Encounter (Signed)
 HE can check with insurance to see if they have any preferred alternatives.  They may prefer tablet instead of capsule.  Jeffery Wood. Jimmey Ralph, MD 04/27/2023 3:28 PM

## 2023-04-27 NOTE — Telephone Encounter (Signed)
 Copied from CRM [id number]. Topic: Referral - Prior Authorization Question >> Apr 27, 2023  9:53 AM Jeffery Wood wrote: Reason for CRM: Patient stated that his insurance will not cover gabapentin (NEURONTIN) 300 MG capsule [[id number]] and is requesting a prior authorization.  Please see pt call/concern and advise if PA has been sent for determination of approval. Is there an alternative you recommend?

## 2023-04-30 ENCOUNTER — Other Ambulatory Visit (HOSPITAL_COMMUNITY): Payer: Self-pay

## 2023-04-30 ENCOUNTER — Encounter: Payer: Self-pay | Admitting: Family Medicine

## 2023-04-30 ENCOUNTER — Telehealth: Payer: Self-pay

## 2023-04-30 NOTE — Telephone Encounter (Signed)
 Spoke with patient notified to call insurance for option on tablets or cupule Verbalized understanding, will let us know what insurance preferred

## 2023-04-30 NOTE — Telephone Encounter (Signed)
 Pharmacy Patient Advocate Encounter   Received notification from Pt Calls Messages that prior authorization for Gabapentin 300mg  caps is required/requested.   Insurance verification completed.   The patient is insured through [place] .   Per test claim: Refill too soon. PA is not needed at this time. Medication was filled 04/19/23. Next eligible fill date is 05/14/23.

## 2023-05-01 NOTE — Telephone Encounter (Signed)
 Do we need to send to prior authorize team?  Katina Degree. Jimmey Ralph, MD 05/01/2023 3:13 PM

## 2023-05-01 NOTE — Telephone Encounter (Signed)
See nnote

## 2023-05-01 NOTE — Telephone Encounter (Signed)
 noted

## 2023-05-03 NOTE — Telephone Encounter (Signed)
 See attachment.

## 2023-05-10 ENCOUNTER — Ambulatory Visit: Payer: Medicare PPO | Admitting: Internal Medicine

## 2023-05-10 ENCOUNTER — Inpatient Hospital Stay: Payer: Medicare PPO | Attending: Oncology | Admitting: Oncology

## 2023-05-10 VITALS — BP 131/84 | HR 80 | Temp 98.1°F | Resp 16 | Ht 75.0 in | Wt 246.3 lb

## 2023-05-10 DIAGNOSIS — C7951 Secondary malignant neoplasm of bone: Secondary | ICD-10-CM | POA: Insufficient documentation

## 2023-05-10 DIAGNOSIS — R200 Anesthesia of skin: Secondary | ICD-10-CM | POA: Diagnosis not present

## 2023-05-10 DIAGNOSIS — Z87891 Personal history of nicotine dependence: Secondary | ICD-10-CM | POA: Diagnosis not present

## 2023-05-10 DIAGNOSIS — M25511 Pain in right shoulder: Secondary | ICD-10-CM | POA: Insufficient documentation

## 2023-05-10 DIAGNOSIS — M79601 Pain in right arm: Secondary | ICD-10-CM | POA: Diagnosis not present

## 2023-05-10 DIAGNOSIS — K5903 Drug induced constipation: Secondary | ICD-10-CM | POA: Diagnosis not present

## 2023-05-10 DIAGNOSIS — T40605A Adverse effect of unspecified narcotics, initial encounter: Secondary | ICD-10-CM | POA: Diagnosis not present

## 2023-05-10 DIAGNOSIS — E119 Type 2 diabetes mellitus without complications: Secondary | ICD-10-CM | POA: Diagnosis not present

## 2023-05-10 DIAGNOSIS — Z79899 Other long term (current) drug therapy: Secondary | ICD-10-CM | POA: Insufficient documentation

## 2023-05-10 DIAGNOSIS — J4489 Other specified chronic obstructive pulmonary disease: Secondary | ICD-10-CM | POA: Diagnosis not present

## 2023-05-10 DIAGNOSIS — M109 Gout, unspecified: Secondary | ICD-10-CM | POA: Insufficient documentation

## 2023-05-10 DIAGNOSIS — G8929 Other chronic pain: Secondary | ICD-10-CM | POA: Diagnosis not present

## 2023-05-10 DIAGNOSIS — C8330 Diffuse large B-cell lymphoma, unspecified site: Secondary | ICD-10-CM | POA: Insufficient documentation

## 2023-05-10 NOTE — Progress Notes (Signed)
 [facility] OFFICE PROGRESS NOTE   Diagnosis: Non-Hodgkin's lymphoma  INTERVAL HISTORY:   Jeffery Wood returns as scheduled.  He has chronic back and right shoulder pain.  No fever or night sweats.  He is active working at home.  Objective:  Vital signs in last 24 hours:  Blood pressure 131/84, pulse 80, temperature 98.1 F (36.7 C), temperature source Oral, resp. rate 16, height 6\' 3"  (1.905 m), weight 246 lb 4.8 oz (111.7 kg), SpO2 98%.    HEENT: Oropharynx without visible mass.  Soft mobile soft tissue prominence at the right submandibular region Lymphatics: No cervical, supraclavicular, axillary, or inguinal nodes Resp: Bilateral expiratory wheeze, no respiratory distress Cardio: Regular rate and rhythm GI: No hepatosplenomegaly Vascular: No leg edema  Lab Results:  Lab Results  Component Value Date   WBC 7.7 01/26/2023   HGB 15.2 01/26/2023   HCT 45.2 01/26/2023   MCV 91.9 01/26/2023   PLT 257.0 01/26/2023   NEUTROABS 5.5 01/26/2023    CMP  Lab Results  Component Value Date   NA 138 11/15/2022   K 4.3 11/15/2022   CL 103 11/15/2022   CO2 27 11/15/2022   GLUCOSE 108 (H) 11/15/2022   BUN 16 11/15/2022   CREATININE 0.71 11/15/2022   CALCIUM 9.3 11/15/2022   PROT 6.1 11/15/2022   ALBUMIN 4.0 11/15/2022   AST 24 11/15/2022   ALT 18 11/15/2022   ALKPHOS 87 11/15/2022   BILITOT 0.6 11/15/2022   GFRNONAA >60 10/31/2022   GFRAA >60 01/18/2018    No results found for: "CEA1", "CEA", "CAN199", "CA125"  Lab Results  Component Value Date   INR 1.05 02/05/2017   LABPROT 13.6 02/05/2017    Imaging:  No results found.  Medications: I have reviewed the patient's current medications.   Assessment/Plan:  High-grade B-cell non-Hodgkin's lymphoma, molecular studies pending, clinical stage IV, IPI- low intermediate risk, FISH panel revealed an [id number] fusion (MYC/IgH), negative for BCL 6 and BCL-2 Chest x-ray 01/15/2017 - medial right upper  lobe/perihilar mass.   Chest CT 01/19/2017 - mass constricting the superior vena cava, subcarinal adenopathy, right suprahilar/mediastinal mass.   01/24/2017 status post bronchoscopy with transbronchial biopsy of level 7 node and biopsy of a right upper lobe lung mass by Dr. Tyrone Sage.  There was compression of the right mainstem bronchus.  A mass was noted in the right upper lobe.  Mediastinal lymph nodes were seen on EBUS.  Multiple biopsies of a level 7 node were obtained.  Brushings and a biopsy of the right upper lobe mass were obtained.  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  Final pathology was nondiagnostic.   PET scan on 02/02/2017 showed hypermetabolism corresponding to the right sided mediastinal mass, progressive since the CT 01/19/2017.  Metastatic disease/lymphoma within the bones, pericardium, stomach, bowel and abdominopelvic nodal stations.   Brain MRI 02/03/2017 showed no evidence of intracranial metastases or acute abnormality.   02/05/2017 status post mediastinoscopy.   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  Molecular studies are pending. Rituximab 02/08/2017 Cycle 1 CHOP 02/09/2017 Cycle 1 R-EPOCH 03/02/2017 Cycle 2 R-EPOCH 03/23/2017 Prophylactic intrathecal methotrexate 03/27/2017 Restaging PET scan 04/11/2017-significant decrease in FDG uptake associated with the chest, abdomen and pelvis and axial and proximal appendicular skeleton.  No new foci of abnormal radiotracer uptake identified. Cycle 3 R-EPOCH 04/13/2017 with intrathecal methotrexate prophylaxis Cycle 4 R-EPOCH 05/04/2017 with intrathecal methotrexate prophylaxis Cycle 5 R- [facility] 05/25/2017 with intrathecal methotrexate prophylaxis  Chest/upper back and right arm pain secondary to #1- resolved. Exertional dyspnea secondary to #1 and COPD.  Improved. Asthma Diabetes mellitus Gout Remote history of "carcinoid syndrome" Remote history of tobacco use Basal cell carcinoma  removed from the right face Chronic low back pain status post epidural steroid injections-followed at a pain management clinic L4-5 decompression and fusion 01/17/2018 SVC syndrome secondary to #1- resolved Constipation secondary to narcotic analgesics and potentially lymphoma involving the GI tract-improved. 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      Disposition: Jeffery Wood remains in clinical remission from lymphoma.  He would like to continue follow-up with cancer center.  He will return for an office visit in 9 months.  He will continue follow-up with Dr. Jimmey Ralph for multiple medical conditions.  Thornton Papas, MD  05/10/2023  8:07 AM

## 2023-05-11 ENCOUNTER — Telehealth: Payer: Self-pay

## 2023-05-11 ENCOUNTER — Ambulatory Visit: Payer: Self-pay | Admitting: Podiatry

## 2023-05-11 ENCOUNTER — Other Ambulatory Visit (HOSPITAL_COMMUNITY): Payer: Self-pay

## 2023-05-11 NOTE — Telephone Encounter (Signed)
 Per test claim, pt is exceeding max daily quantity allowed, in order to satisfy insurance quantity limits, please consider changing to 800 mg tablets (1.5 tablets per dose, three times daily) if clinically appropriate, if written within as recommended, pt can expect to pay $9.53 for a 30 day supply. If not clinically appropriate, please provided justification to be submitted with PA. Thank you.

## 2023-05-11 NOTE — Telephone Encounter (Signed)
**Note De-identified  [place] Obfuscation** Please advise 

## 2023-05-11 NOTE — Telephone Encounter (Signed)
 Pharmacy Patient Advocate Encounter   Received notification from Patient Advice Request messages that prior authorization for Gabapentin 300 mg is required/requested.   Insurance verification completed.   The patient is insured through [employer] .   Following message was relayed to office in original request:  Per test claim, pt is exceeding max daily quantity allowed, in order to satisfy insurance quantity limits, please consider changing to 800 mg tablets (1.5 tablets per dose, three times daily) if clinically appropriate, if written within as recommended, pt can expect to pay $9.53 for a 30 day supply. If not clinically appropriate, please provided justification to be submitted with PA. Thank you.

## 2023-05-13 ENCOUNTER — Other Ambulatory Visit: Payer: Self-pay | Admitting: Family Medicine

## 2023-05-14 ENCOUNTER — Ambulatory Visit (INDEPENDENT_AMBULATORY_CARE_PROVIDER_SITE_OTHER)

## 2023-05-14 ENCOUNTER — Other Ambulatory Visit: Payer: Self-pay

## 2023-05-14 ENCOUNTER — Ambulatory Visit: Payer: Self-pay | Admitting: Podiatry

## 2023-05-14 ENCOUNTER — Other Ambulatory Visit (HOSPITAL_COMMUNITY): Payer: Self-pay

## 2023-05-14 DIAGNOSIS — M2041 Other hammer toe(s) (acquired), right foot: Secondary | ICD-10-CM

## 2023-05-14 DIAGNOSIS — M7751 Other enthesopathy of right foot: Secondary | ICD-10-CM | POA: Diagnosis not present

## 2023-05-14 DIAGNOSIS — M7750 Other enthesopathy of unspecified foot: Secondary | ICD-10-CM

## 2023-05-14 MED ORDER — CELECOXIB 200 MG PO CAPS
200.0000 mg | ORAL_CAPSULE | Freq: Two times a day (BID) | ORAL | 5 refills | Status: DC | PRN
  Filled 2023-05-14: qty 60, 30d supply, fill #0
  Filled 2023-06-11: qty 60, 30d supply, fill #1
  Filled 2023-07-09: qty 60, 30d supply, fill #2
  Filled 2023-08-21: qty 60, 30d supply, fill #3
  Filled 2023-09-18: qty 60, 30d supply, fill #4
  Filled 2023-10-30: qty 60, 30d supply, fill #5

## 2023-05-14 NOTE — Progress Notes (Unsigned)
 Subjective: Chief Complaint  Patient presents with   Toe Pain    RM#14 Right toe pain unable to bend big toe and second and third facing down to keep balance while walking causing toe break down and pain.   68 year old male presents after the above concerns.  He states that due to him not being up to bend his big toe, along the IPJ, he started to get worsening calluses and pain to the tips of his 2nd and 3rd toes.  He chose if no further surgical now he has been more pressure to the area causing discomfort.  No open lesions.  His balance has been off because of neuropathy and other medical issues.  Objective: AAO x3, NAD DP/PT pulses palpable bilaterally, CRT less than 3 seconds Incisions from prior surgery well-healed.  No pain on surgical site.  Hallux IPJ arthrodesis noted.  There is good MPJ range of motion.  Flexible hammertoe contractures present to lesser digits. No pain with calf compression, swelling, warmth, erythema  Assessment: Hammertoe contractures, history of previous hallux IPJ arthrodesis  Plan: -All treatment options discussed with the patient including all alternatives, risks, complications.  -X-rays obtained reviewed.  Hardware intact and complicating factors.  Radiolucency still across the arthrodesis site on the hallux IPJ consistent with a nonunion. 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  Manage she has the other toes.  We discussed with conservative as well as surgical measures.  I dispensed toe crest as they were helpful previously as well.  I do not think that removal of the hardware is going to help with range of motion or help other hammertoes.  I discussed with him possible hardware removal we will get a quote for this.  Discussed with him flexor tenotomy the other toes but he is hesitant to do that. -Patient encouraged to call the office with any questions, concerns, change in symptoms.    Charity Conch DPM

## 2023-05-15 ENCOUNTER — Other Ambulatory Visit (HOSPITAL_COMMUNITY): Payer: Self-pay

## 2023-05-15 ENCOUNTER — Other Ambulatory Visit: Payer: Self-pay

## 2023-05-15 NOTE — Telephone Encounter (Signed)
 Ok to change to 800mg  tablets take 1.5 tablets three times daily.   Please inform patient of the change.  Jinny Mounts. Daneil Dunker, MD 05/15/2023 7:03 PM

## 2023-05-16 ENCOUNTER — Other Ambulatory Visit: Payer: Self-pay | Admitting: *Deleted

## 2023-05-16 ENCOUNTER — Other Ambulatory Visit (HOSPITAL_COMMUNITY): Payer: Self-pay

## 2023-05-16 MED ORDER — GABAPENTIN 800 MG PO TABS
1200.0000 mg | ORAL_TABLET | Freq: Three times a day (TID) | ORAL | 1 refills | Status: DC
  Filled 2023-05-16: qty 90, 20d supply, fill #0
  Filled 2023-06-17: qty 90, 20d supply, fill #1

## 2023-05-16 NOTE — Telephone Encounter (Signed)
 Rx send in to pharmacy patient notified

## 2023-05-17 ENCOUNTER — Other Ambulatory Visit: Payer: Self-pay

## 2023-05-18 ENCOUNTER — Other Ambulatory Visit: Payer: Self-pay | Admitting: Family Medicine

## 2023-05-18 ENCOUNTER — Other Ambulatory Visit (HOSPITAL_COMMUNITY): Payer: Self-pay

## 2023-05-18 ENCOUNTER — Other Ambulatory Visit: Payer: Self-pay

## 2023-05-21 ENCOUNTER — Encounter: Payer: Self-pay | Admitting: Podiatry

## 2023-05-21 ENCOUNTER — Other Ambulatory Visit: Payer: Self-pay

## 2023-05-21 MED ORDER — PANTOPRAZOLE SODIUM 40 MG PO TBEC
40.0000 mg | DELAYED_RELEASE_TABLET | Freq: Every day | ORAL | 1 refills | Status: DC
Start: 2023-05-21 — End: 2023-12-10
  Filled 2023-05-21: qty 90, 90d supply, fill #0
  Filled 2023-08-28: qty 90, 90d supply, fill #1

## 2023-05-21 NOTE — Telephone Encounter (Signed)
 Shelly, can you let him know the contact info? We were also working on a quote.

## 2023-05-22 ENCOUNTER — Encounter (HOSPITAL_COMMUNITY): Payer: Self-pay

## 2023-05-23 ENCOUNTER — Telehealth (HOSPITAL_COMMUNITY): Payer: Self-pay | Admitting: *Deleted

## 2023-05-23 NOTE — Telephone Encounter (Signed)
 Reaching out to patient to offer assistance regarding upcoming cardiac imaging study; pt verbalizes understanding of appt date/time, parking situation and where to check in, pre-test NPO status and medications ordered, and verified current allergies; name and call back number provided for further questions should they arise Kerri Peed RN Navigator Cardiac Imaging [facility] [phone] office [phone] cell   Patient aware to avoid caffeine for 12 hours prior to test, hold imdur  and flomax .

## 2023-05-24 ENCOUNTER — Ambulatory Visit: Admission: RE | Admit: 2023-05-24 | Source: Ambulatory Visit

## 2023-05-24 ENCOUNTER — Other Ambulatory Visit (HOSPITAL_COMMUNITY): Payer: Self-pay

## 2023-05-26 ENCOUNTER — Other Ambulatory Visit (HOSPITAL_COMMUNITY): Payer: Self-pay

## 2023-05-28 ENCOUNTER — Other Ambulatory Visit: Payer: Self-pay

## 2023-05-29 ENCOUNTER — Other Ambulatory Visit: Payer: Self-pay | Admitting: Family Medicine

## 2023-05-30 ENCOUNTER — Other Ambulatory Visit (HOSPITAL_COMMUNITY): Payer: Self-pay

## 2023-05-30 ENCOUNTER — Other Ambulatory Visit: Payer: Self-pay

## 2023-05-30 MED ORDER — METFORMIN HCL 1000 MG PO TABS
1000.0000 mg | ORAL_TABLET | Freq: Two times a day (BID) | ORAL | 0 refills | Status: DC
  Filled 2023-05-30: qty 180, 90d supply, fill #0

## 2023-05-31 DIAGNOSIS — Z472 Encounter for removal of internal fixation device: Secondary | ICD-10-CM | POA: Diagnosis not present

## 2023-06-03 ENCOUNTER — Other Ambulatory Visit: Payer: Self-pay | Admitting: Family Medicine

## 2023-06-04 ENCOUNTER — Other Ambulatory Visit: Payer: Self-pay

## 2023-06-04 MED ORDER — TRAZODONE HCL 50 MG PO TABS
25.0000 mg | ORAL_TABLET | Freq: Every evening | ORAL | 3 refills | Status: DC | PRN
Start: 2023-06-04 — End: 2023-10-17
  Filled 2023-06-04: qty 30, 30d supply, fill #0
  Filled 2023-07-09: qty 30, 30d supply, fill #1
  Filled 2023-08-13: qty 30, 30d supply, fill #2
  Filled 2023-09-18: qty 30, 30d supply, fill #3

## 2023-06-05 ENCOUNTER — Ambulatory Visit: Admitting: Podiatry

## 2023-06-05 ENCOUNTER — Encounter: Payer: Self-pay | Admitting: Podiatry

## 2023-06-05 ENCOUNTER — Telehealth (HOSPITAL_COMMUNITY): Payer: Self-pay | Admitting: *Deleted

## 2023-06-05 ENCOUNTER — Telehealth: Payer: Self-pay | Admitting: Podiatry

## 2023-06-05 DIAGNOSIS — Z969 Presence of functional implant, unspecified: Secondary | ICD-10-CM | POA: Diagnosis not present

## 2023-06-05 NOTE — Progress Notes (Unsigned)
 Subjective: Chief Complaint  Patient presents with   Hammer Toe    RM#13 Patient states here to discuss previous surgery issues.   68 year old male presents the office with above concerns.  He like to have the screw removed to his big toe.  He thinks the screw is causing issues and would like to have this removed.  No recent injuries.  Objective: AAO x3, NAD DP/PT pulses palpable bilaterally, CRT less than 3 seconds No edema present to the right hallux.  This could some discomfort in this area.  Suppressible hammertoe contractures present of the lesser digits most of the second third toes and this is where he has majority of his symptoms.  There is no erythema warmth or any signs of infection today. No pain with calf compression, swelling, warmth, erythema  Assessment: Retained hardware  Plan: -All treatment options discussed with the patient including all alternatives, risks, complications.  -We again discussed with conservative as well as surgical treatment options.  He would like to have this removed.  I was very clear that this may not alleviate his symptoms or help but he wants to have the screw removed.  We will plan on doing this tomorrow. -The incision placement as well as the postoperative course was discussed with the patient. I discussed risks of the surgery which include, but not limited to, infection, bleeding, pain, swelling, need for further surgery, delayed or nonhealing, painful or ugly scar, numbness or sensation changes, over/under correction, recurrence, transfer lesions, further deformity, hardware failure, DVT/PE, loss of toe/foot. Patient understands these risks and wishes to proceed with surgery. The surgical consent was reviewed with the patient all 3 pages were signed. No promises or guarantees were given to the outcome of the procedure. All questions were answered to the best of my ability. Before the surgery the patient was encouraged to call the office if there is any  further questions. The surgery will be performed at the [facility] on an outpatient basis. -Patient encouraged to call the office with any questions, concerns, change in symptoms.   Charity Conch DPM

## 2023-06-05 NOTE — Telephone Encounter (Signed)
 Reaching out to patient to offer assistance regarding upcoming cardiac imaging study; pt verbalizes understanding of appt date/time, parking situation and where to check in, pre-test NPO status and verified current allergies; name and call back number provided for further questions should they arise  Chase Copping RN Navigator Cardiac Imaging [facility] [phone] office [phone] cell  Patient aware to avoid caffeine, Imdur , and flomax  prior to his cardiac PET scan.

## 2023-06-05 NOTE — Patient Instructions (Signed)

## 2023-06-05 NOTE — Telephone Encounter (Signed)
 DOS: 06/06/23  (RT) REMOVAL FIXATION DEEP K-WIRE [place] -[id number]       EFFECTIVE DATE:   12/30/2020    OOP:   $3,300.00  REMAINING :  $3,085.00   CO INSURANCE: 0%  PER THE [employer] PROVIDER PORTAL NO PRIOR AUTH IS REQ FOR CPT CODE 40981  REF# [id number]

## 2023-06-06 ENCOUNTER — Other Ambulatory Visit: Payer: Self-pay | Admitting: Podiatry

## 2023-06-06 ENCOUNTER — Other Ambulatory Visit (HOSPITAL_COMMUNITY): Payer: Self-pay

## 2023-06-06 DIAGNOSIS — Z472 Encounter for removal of internal fixation device: Secondary | ICD-10-CM | POA: Diagnosis not present

## 2023-06-06 DIAGNOSIS — Z4889 Encounter for other specified surgical aftercare: Secondary | ICD-10-CM | POA: Diagnosis not present

## 2023-06-06 MED ORDER — CEPHALEXIN 500 MG PO CAPS
500.0000 mg | ORAL_CAPSULE | Freq: Three times a day (TID) | ORAL | 0 refills | Status: DC
  Filled 2023-06-06: qty 21, 7d supply, fill #0

## 2023-06-06 MED ORDER — HYDROCODONE-ACETAMINOPHEN 5-325 MG PO TABS
1.0000 | ORAL_TABLET | Freq: Four times a day (QID) | ORAL | 0 refills | Status: AC | PRN
  Filled 2023-06-06: qty 15, 2d supply, fill #0

## 2023-06-07 ENCOUNTER — Ambulatory Visit
Admission: RE | Admit: 2023-06-07 | Discharge: 2023-06-07 | Disposition: A | Discharge status: Home / Self Care | Source: Ambulatory Visit | Attending: Physician Assistant | Admitting: Physician Assistant

## 2023-06-07 DIAGNOSIS — E785 Hyperlipidemia, unspecified: Secondary | ICD-10-CM

## 2023-06-07 DIAGNOSIS — R079 Chest pain, unspecified: Secondary | ICD-10-CM | POA: Diagnosis not present

## 2023-06-07 DIAGNOSIS — I25119 Atherosclerotic heart disease of native coronary artery with unspecified angina pectoris: Secondary | ICD-10-CM

## 2023-06-07 DIAGNOSIS — I10 Essential (primary) hypertension: Secondary | ICD-10-CM | POA: Diagnosis not present

## 2023-06-07 LAB — NM PET CT CARDIAC PERFUSION MULTI W/ABSOLUTE BLOODFLOW
LV dias vol: 92 mL (ref 62–150)
LV sys vol: 38 mL
MBFR: 1.51
Nuc Rest EF: 59 %
Nuc Stress EF: 60 %
Peak HR: 79 {beats}/min
Rest HR: 73 {beats}/min
Rest MBF: 0.92 ml/g/min
Rest Nuclear Isotope Dose: 25 mCi
SRS: 0
SSS: 9
ST Depression (mm): 0 mm
Stress MBF: 1.39 ml/g/min
Stress Nuclear Isotope Dose: 25 mCi
TID: 1.37

## 2023-06-07 MED ORDER — REGADENOSON 0.4 MG/5ML IV SOLN
INTRAVENOUS | Status: AC
Start: 2023-06-07 — End: ?
  Filled 2023-06-07: qty 5

## 2023-06-07 MED ORDER — RUBIDIUM RB82 GENERATOR (RUBYFILL)
25.0000 | PACK | Freq: Once | INTRAVENOUS | Status: AC
  Administered 2023-06-07: 24.97 via INTRAVENOUS

## 2023-06-07 MED ORDER — RUBIDIUM RB82 GENERATOR (RUBYFILL)
25.0000 | PACK | Freq: Once | INTRAVENOUS | Status: AC
  Administered 2023-06-07: 24.99 via INTRAVENOUS

## 2023-06-07 MED ORDER — REGADENOSON 0.4 MG/5ML IV SOLN
0.4000 mg | Freq: Once | INTRAVENOUS | Status: AC
  Administered 2023-06-07: 0.4 mg via INTRAVENOUS
  Filled 2023-06-07: qty 5

## 2023-06-07 NOTE — Progress Notes (Signed)
 Patient presents for a cardiac PET stress test and tolerated procedure without incident. Patient maintained acceptable vital signs throughout the test and was offered caffeine after test.  Patient ambulated out of department with a steady gait.

## 2023-06-08 ENCOUNTER — Other Ambulatory Visit: Payer: Self-pay

## 2023-06-08 ENCOUNTER — Other Ambulatory Visit (HOSPITAL_COMMUNITY): Payer: Self-pay

## 2023-06-08 MED ORDER — METOPROLOL TARTRATE 25 MG PO TABS
25.0000 mg | ORAL_TABLET | Freq: Two times a day (BID) | ORAL | 3 refills | Status: AC
  Filled 2023-06-08: qty 180, 90d supply, fill #0
  Filled 2023-09-11: qty 180, 90d supply, fill #1
  Filled 2023-12-10: qty 180, 90d supply, fill #2
  Filled 2024-03-05: qty 180, 90d supply, fill #3

## 2023-06-08 NOTE — Telephone Encounter (Signed)
 The patient has been notified of the result and verbalized understanding.  All questions (if any) were answered. Assunta Pupo Chauvigne, RN 06/08/2023 10:51 AM  Prescription has been sent in. Appointment has been scheduled.

## 2023-06-10 NOTE — Progress Notes (Unsigned)
 Cardiology Office Note:    Date:  06/14/2023   ID:  Jeffery Wood, DOB 20-Dec-1955, MRN [id number]  PCP:  Jeffery Clamp, MD   [facility] Providers Cardiologist:  Jeffery Berger, MD     Follow up of CAD      History of Present Illness:    Jeffery Wood is a 68 y.o. male with a hx of CAD s/p PCI/DES to mid LAD, distal LAD, and first diagonal ostium, diabetes, large cell lymphoma (in remission), COPD, memory problems   April 2024:  Ca score of 294 74th percentile for age/sex).  Pt complained of intermitt CP worse with activity as weill as giving out with exertion. He underewent LHC on 06/10/21 revealed 95% distal LAD lesion treated with DES, 80% mid LAD lesion treated with DES, 90% D1 lesion treated with balloon angioplasty reducing the blockage down to 40%, distal RPDA occlusion with left-to-right collaterals, EF 55 to 65% by LV gram. DAPT with aspirin  and clopidogrel  x6 months, Plan for clopidogrel  monotherapy due to diffuse disease noted in the RCA after 6 months.     Myoview  in Jan 2024 showed normal  perfusion    I saw the pt in Aug 2024   He was seen by Jeffery Wood on 04/04/23  The pt reported one episode of CP with diaphoresis 2 wks prior that woke him from sleep    Set up for Lexiscan  PET/CT (see below) This showed a defect in inferolateral wall (base, mid) that was reversible.   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   TTE 04/26/2023: EF 60-65, no RWMA, GR 1 DD, normal RVSF, mild dilation of aortic root 39 mm, mild dilation of ascending aorta 41 mm    Asthma    Basal cell carcinoma (BCC) of right temple region    CAD (coronary artery disease) 2023   5v    Cancer (HCC)    basal cell of right temple; carcinoids   Complication of anesthesia    hard to wake up after bronchoscopy   Diabetes (HCC)    type 2   Dyspnea    GERD (gastroesophageal reflux disease)    Gout    resolved, no current problem   Headache    History of hiatal hernia 05/16/2006   small noted on EGD   Hypertension    hx   Lung mass    Lymphoma in remission    last chemo 05/2017, in remission-chest, but all over   Memory loss    mild - r/t chemo, 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   Neuromuscular disorder (HCC)    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   Sleep apnea    does not use anything for OSA    Past Surgical History:  Procedure Laterality Date   BACK SURGERY     L4-L5 bracket and screws   BICEPS TENDON REPAIR Left    CARPAL TUNNEL RELEASE     COLONOSCOPY     CORONARY BALLOON ANGIOPLASTY N/A 06/10/2021   Procedure: CORONARY BALLOON ANGIOPLASTY;  Surgeon: Jeffery Rusk, MD;  Location: [facility] INVASIVE CV LAB;  Service: Cardiovascular;  Laterality: N/A;   CORONARY STENT INTERVENTION N/A 06/10/2021   Procedure: CORONARY STENT INTERVENTION;  Surgeon: Jeffery Rusk, MD;  Location: [facility] INVASIVE CV LAB;  Service: Cardiovascular;  Laterality: N/A;   CORONARY ULTRASOUND/IVUS N/A 06/10/2021   Procedure: Intravascular Ultrasound/IVUS;  Surgeon: Jeffery Rusk, MD;  Location: [facility] INVASIVE CV LAB;  Service: Cardiovascular;  Laterality: N/A;   FINGER SURGERY     KNEE ARTHROSCOPY     ACL tear   LEFT HEART CATH AND CORONARY ANGIOGRAPHY N/A 06/10/2021   Procedure: LEFT HEART CATH AND CORONARY ANGIOGRAPHY;  Surgeon: Jeffery Rusk, MD;  Location: [facility] INVASIVE CV LAB;  Service: Cardiovascular;  Laterality: N/A;   LEFT HEART CATHETERIZATION WITH CORONARY ANGIOGRAM N/A 12/07/2010   Procedure: LEFT HEART CATHETERIZATION WITH CORONARY ANGIOGRAM;  Surgeon: Jeffery Lacer, MD;  Location: [facility] CATH LAB;  Service: Cardiovascular;  Laterality: N/A;   LUMBAR  LAMINECTOMY/DECOMPRESSION MICRODISCECTOMY Right 12/09/2020   Procedure: MICRODISCECTOMY, RIGHT LUMBAR TWO-THREE, LUMBAR THREE-FOUR;  Surgeon: Jeffery Kansas, MD;  Location: [facility] OR;  Service: Neurosurgery;  Laterality: Right;   PILONIDAL CYST EXCISION     PORT-A-CATH REMOVAL N/A 11/09/2017   Procedure: REMOVAL PORT-A-CATH;  Surgeon: Jeffery Matar, MD;  Location: [facility];  Service: General;  Laterality: N/A;  local   PORTACATH PLACEMENT Left 02/27/2017   Procedure: INSERTION PORT-A-CATH;  Surgeon: Jeffery Matar, MD;  Location: [facility] ORS;  Service: General;  Laterality: Left;   UPPER GASTROINTESTINAL ENDOSCOPY  05/16/2006   VIDEO BRONCHOSCOPY WITH ENDOBRONCHIAL ULTRASOUND N/A 01/24/2017   Procedure: VIDEO BRONCHOSCOPY WITH ENDOBRONCHIAL ULTRASOUND with TRANSBRONCHIAL BIOPSY;  Surgeon: Jeffery Beauvais, MD;  Location: [facility] OR;  Service: Thoracic;  Laterality: N/A;   VIDEO MEDIASTINOSCOPY N/A 02/05/2017   Procedure: VIDEO MEDIASTINOSCOPY;  Surgeon: Jeffery Beauvais, MD;  Location: [facility] OR;  Service: Thoracic;  Laterality: N/A;    Current Medications: Current Meds  Medication Sig   ALBUTEROL  SULFATE HFA IN Inhale into the lungs. AS DIRECTED PRN   atorvastatin  (LIPITOR) 40 MG tablet Take 1 tablet (40 mg total) by mouth daily.   augmented betamethasone  dipropionate (DIPROLENE -AF) 0.05 % cream Apply 1 Application topically to affected areas on skin as directed.   azelastine  (ASTELIN ) 0.1 % nasal spray Place 2 sprays into both nostrils 2 (two) times daily.   celecoxib  (CELEBREX ) 200 MG capsule Take 1 capsule (200 mg total) by mouth 2 (two) times daily as needed.   cephALEXin  (KEFLEX ) 500 MG capsule Take 1 capsule (500 mg total) by mouth 3 (three) times daily for 7 days   ciclopirox  (PENLAC ) 8 % solution Apply topically at bedtime. Apply over nail and surrounding skin. Apply daily over previous coat. After seven (7) days, may remove with alcohol and continue cycle.   clopidogrel   (PLAVIX ) 75 MG tablet Take 1 tablet (75 mg total) by mouth daily with breakfast.   cyclobenzaprine  (FLEXERIL ) 10 MG tablet Take 1 tablet (10 mg total) by mouth 3 (three) times daily as needed for muscle spasms.   Fluticasone -Umeclidin-Vilant (TRELEGY ELLIPTA ) 100-62.5-25 MCG/ACT AEPB Inhale 1 puff then rinse mouth, once daily   gabapentin  (NEURONTIN ) 800 MG tablet Take 1.5 tablets (1,200 mg total) by mouth 3 (three) times daily.   glimepiride  (AMARYL ) 4 MG tablet Take 0.5 tablets (2 mg total) by mouth daily before largest meal of the day.   HYDROcodone -acetaminophen  (NORCO/VICODIN) 5-325 MG tablet Take 1-2 tablets by mouth every 6 (six) hours  as needed.   ipratropium-albuterol  (DUONEB) 0.5-2.5 (3) MG/3ML SOLN Take 3 mLs by nebulization every 6 (six) hours as needed.   isosorbide  mononitrate (IMDUR ) 30 MG 24 hr tablet Take 1 tablet (30 mg total) by mouth daily.   levalbuterol  (XOPENEX  HFA) 45 MCG/ACT inhaler Inhale 2 puffs into the lungs daily as needed for wheezing or shortness of breath.   metFORMIN  (GLUCOPHAGE ) 1000 MG tablet Take 1 tablet (1,000 mg total) by mouth 2 (two) times daily.   metoprolol  tartrate (LOPRESSOR ) 25 MG tablet Take 1 tablet (25 mg total) by mouth 2 (two) times daily.   Multiple Vitamin (MULTIVITAMIN) capsule Take 1 capsule by mouth daily.   OVER THE COUNTER MEDICATION Take 1 tablet by mouth daily. Primal Mind Fuel   oxyCODONE -acetaminophen  (PERCOCET/ROXICET) 5-325 MG tablet Take 1 - 2 tablets by mouth every 6 hours as needed for severe pain.   pantoprazole  (PROTONIX ) 40 MG tablet Take 1 tablet (40 mg total) by mouth daily.   tamsulosin  (FLOMAX ) 0.4 MG CAPS capsule Take 1 capsule (0.4 mg) by mouth daily.   traMADol  (ULTRAM ) 50 MG tablet Take 1 tablet (50 mg total) by mouth every 8 (eight) hours as needed.   traZODone  (DESYREL ) 50 MG tablet Take 0.5-1 tablets (25-50 mg total) by mouth at bedtime as needed for sleep.   [DISCONTINUED] loratadine  (CLARITIN ) 10 MG tablet Take 10  mg by mouth daily.   [DISCONTINUED] metoCLOPramide  (REGLAN ) 10 MG tablet Take 1 tablet (10 mg total) by mouth every 6 (six) hours as needed for nausea.   [DISCONTINUED] nitroGLYCERIN  (NITROSTAT ) 0.4 MG SL tablet Place 1 tablet (0.4 mg total) under the tongue every 5 (five) minutes as needed for chest pain. If no relief after 3 doses call 911.     Allergies:   Patient has no known allergies.   Social History   Socioeconomic History   Marital status: Married    Spouse name: Not on file   Number of children: 2   Years of education: Not on file   Highest education level: Some college, no degree  Occupational History   Occupation: retired  Tobacco Use   Smoking status: Former    Current packs/day: 0.00    Average packs/day: 3.0 packs/day for 20.0 years (60.0 ttl pk-yrs)    Types: Cigarettes    Start date: 01/31/1968    Quit date: 01/31/1988    Years since quitting: 35.3   Smokeless tobacco: Never  Vaping Use   Vaping status: Never Used  Substance and Sexual Activity   Alcohol use: No   Drug use: No   Sexual activity: Yes    Partners: Female    Birth control/protection: None  Other Topics Concern   Not on file  Social History Narrative   1 grand and 1 due 08/2021   2 children   Drinks monsters daily   Lives with wife   One floor home   Left handed   Social Drivers of Health   Financial Resource Strain: Low Risk  (04/18/2023)   Overall Financial Resource Strain (CARDIA)    Difficulty of Paying Living Expenses: Not hard at all  Food Insecurity: No Food Insecurity (04/18/2023)   Hunger Vital Sign    Worried About Running Out of Food in the Last Year: Never true    Ran Out of Food in the Last Year: Never true  Transportation Needs: No Transportation Needs (04/18/2023)   PRAPARE - Transportation    Lack of Transportation (Medical): No    Lack  of Transportation (Non-Medical): No  Physical Activity: Inactive (04/18/2023)   Exercise Vital Sign    Days of Exercise per Week: 0 days     Minutes of Exercise per Session: 0 min  Stress: No Stress Concern Present (04/18/2023)   [employer] of Occupational Health - Occupational Stress Questionnaire    Feeling of Stress : Not at all  Social Connections: Moderately Isolated (04/18/2023)   Social Connection and Isolation Panel [NHANES]    Frequency of Communication with Friends and Family: More than three times a week    Frequency of Social Gatherings with Friends and Family: More than three times a week    Attends Religious Services: Never    Database administrator or Organizations: No    Attends Engineer, structural: Never    Marital Status: Married     Family History: The patient's family history includes Asthma in his mother; Emphysema in his mother; Heart disease in his father.  ROS:   Please see the history of present illness.    + fatigue + chest pain All other systems reviewed and are negative.  Labs/Other Studies Reviewed:    The following studies were reviewed today:  Lexiscan  PET/CT  May 2025    LV perfusion is abnormal. There is evidence of ischemia. There is no evidence of infarction. Defect 1: There is a large defect with moderate reduction in uptake present in the mid to basal inferolateral location(s) that is reversible. There is normal wall motion in the defect area. Consistent with ischemia. The defect is consistent with abnormal perfusion in the LCx or RCA territories.   End diastolic cavity size is normal. End systolic cavity size is normal.   Myocardial blood flow was computed to be 0.8ml/g/min at rest and 1.39ml/g/min at stress. Global myocardial blood flow reserve was 1.51 and was abnormal.   Coronary calcium  assessment not performed due to prior revascularization. Prior stents   Findings are consistent with ischemia. The study is intermediate risk.   Inferior lateral ischemica at mid/basal level. MBFR only 0.85 in area of abnormality    Echo March 2025   1. Left ventricular  ejection fraction, by estimation, is 60 to 65%. Left  ventricular ejection fraction by 3D volume is 62 %. The left ventricle has  normal function. The left ventricle has no regional wall motion  abnormalities. Left ventricular diastolic   parameters are consistent with Grade I diastolic dysfunction (impaired  relaxation). The average left ventricular global longitudinal strain is  -21.8 %. The global longitudinal strain is normal.   2. Right ventricular systolic function is normal. The right ventricular  size is normal. Tricuspid regurgitation signal is inadequate for assessing  PA pressure.   3. The mitral valve is normal in structure. No evidence of mitral valve  regurgitation. No evidence of mitral stenosis.   4. The aortic valve is tricuspid. There is mild calcification of the  aortic valve. Aortic valve regurgitation is not visualized. No aortic  stenosis is present.   5. Aortic dilatation noted. There is mild dilatation of the aortic root,  measuring 39 mm. There is mild dilatation of the ascending aorta,  measuring 41 mm.   6. IVC not visualized.    LHC 06/10/21    Dist LAD lesion is 95% stenosed.  A drug-eluting stent was successfully placed using a STENT ONYX FRONTIER 2.0X30, postdilated to 2.25 mm.   Post intervention, there is a 0% residual stenosis.   Mid LAD lesion is 80% stenosed.  A drug-eluting stent was successfully placed using a STENT ONYX FRONTIER 3.5X15, postdilated to 3.75 mm and optimized with intravascular ultrasound.   Post intervention, there is a 0% residual stenosis.   1st Diag lesion is 90% stenosed.  Balloon angioplasty was performed using a BALLN SAPPHIRE 2.0X10.   Post intervention, there is a 40% residual stenosis.   distal RPDA lesion is 100% stenosed.  Left to right collaterals.   The left ventricular systolic function is normal.   LV end diastolic pressure is normal.   The left ventricular ejection fraction is 55-65% by visual estimate.   There is  no aortic valve stenosis.   High bifurcation of right radial from brachial artery.  Very small radial which will not accept a 6 French sheath.  4 French catheters did fit but intervention was done from the right femoral artery.   In the future, if cath is considered, could try right ulnar artery if radial remains patent.   Successful PCI of the mid LAD, distal LAD and first diagonal ostium.  Continue dual antiplatelet therapy for at least 6 months.  After 6 months, would consider clopidogrel  monotherapy due to diffuse disease noted in the RCA.    CT Cardiac Score 05/19/21  IMPRESSION: 1. Coronary calcium  score of 294. This was 74th percentile for age-, race-, and sex-matched controls.   2. Dilated ascending aorta measuring 43mm   3. Dilated main pulmonary artery measuring 30mm    Recent Labs: 11/15/2022: ALT 18; BUN 16; Creatinine, Ser 0.71; Potassium 4.3; Sodium 138; TSH 1.36 01/26/2023: Hemoglobin 15.2; Platelets 257.0  Recent Lipid Panel    Component Value Date/Time   CHOL 111 11/15/2022 0825   CHOL 120 09/06/2021 0728   TRIG 62.0 11/15/2022 0825   HDL 43.70 11/15/2022 0825   HDL 41 09/06/2021 0728   CHOLHDL 3 11/15/2022 0825   VLDL 12.4 11/15/2022 0825   LDLCALC 55 11/15/2022 0825   LDLCALC 65 09/06/2021 0728   LDLCALC 119 (H) 12/16/2019 0847     Risk Assessment/Calculations:       Physical Exam:    VS:  BP (!) 114/56   Pulse 83   Ht 6\' 3"  (1.905 m)   Wt 245 lb 8 oz (111.4 kg)   SpO2 95%   BMI 30.69 kg/m     Wt Readings from Last 3 Encounters:  06/14/23 250 lb 2 oz (113.5 kg)  06/12/23 245 lb (111.1 kg)  06/11/23 245 lb 8 oz (111.4 kg)     GEN:  Obese 68 yo in no acute distress HEENT: Normal NECK:  JVP is normal  CARDIAC: RRR, no murmurs RESPIRATORY:  Clear to auscultation bilaterally  ABDOMEN: Soft, non-tender, non-distended MUSCULOSKELETAL:  No LE  edema;    EKG:  EKG  is not  ordered today.     Assessment and Plan:     CAD s/p DES:   Pt  with severe diffuse CAD.  He underwent  PCI/DES to the distal LAD, mid LAD, D1.   The distal R PDA is occluded with L to R collaterals   LVEF is normal     Myoview  in Jan 2024 without ischemia Seen by Jeffery Wood in March   One episode of CP that woke him   None since  Echo ordered  LVEF normal Lexiscan   PET/CT done   See above    It is abnormal but patient has known occluasiion of R PDA that fills via L to R collaterals  This may explain the  inferolateral changes  He is not having any CP    He remains active    I would keep on the same regimen     Follow clinically I would not plan further testing for now unless symptoms change   2 Hyperlipidemia    LDL 55, HDL 42  Trig 62 in OCt 2024 Continue atorvastatin   3  Pulmonary    Follows with C Young    Pt says breathing is stable   4  DM   A1C excellent 5.8 in Oct 2024  Reviewed diet   5  Neuropathy   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  Concerns regarding medicines are outlined above.  No orders of the defined types were placed in this encounter.  No orders of the defined types were placed in this encounter.   Patient Instructions  Medication Instructions:   *If you need a refill on your cardiac medications before your next appointment, please call your pharmacy*  Lab Work:  If you have labs (blood work) drawn today and your tests are completely normal, you will receive your results only by: MyChart Message (if you have MyChart) OR A paper copy in the mail If you have any lab test that is abnormal or we need to change your treatment, we will call you to review the results.  Testing/Procedures:   Follow-Up: At [facility], you and your health needs are our priority.  As part of our continuing mission to provide you with exceptional heart care, our  providers are all part of one team.  This team includes your primary Cardiologist (physician) and Advanced Practice Providers or APPs (Physician Assistants and Nurse Practitioners) who all work together to provide you with the care you need, when you need it.  Your next appointment:  November/ December 2025 We recommend signing up for the patient portal called "MyChart".  Sign up information is provided on this After Visit Summary.  MyChart is used to connect with patients for Virtual Visits (Telemedicine).  Patients are able to view lab/test results, encounter notes, upcoming appointments, etc.  Non-urgent messages can be sent to your provider as well.   To learn more about what you can do with MyChart, go to [email or web address].   Other Instructions        Signed, Jeffery Berger, MD  06/14/2023 8:33 PM    [facility] =

## 2023-06-11 ENCOUNTER — Other Ambulatory Visit (HOSPITAL_COMMUNITY): Payer: Self-pay

## 2023-06-11 ENCOUNTER — Ambulatory Visit: Attending: Internal Medicine | Admitting: Internal Medicine

## 2023-06-11 ENCOUNTER — Encounter: Payer: Self-pay | Admitting: Internal Medicine

## 2023-06-11 VITALS — BP 114/56 | HR 83 | Ht 75.0 in | Wt 245.5 lb

## 2023-06-11 DIAGNOSIS — I2510 Atherosclerotic heart disease of native coronary artery without angina pectoris: Secondary | ICD-10-CM | POA: Diagnosis not present

## 2023-06-11 NOTE — Patient Instructions (Signed)
 Medication Instructions:   *If you need a refill on your cardiac medications before your next appointment, please call your pharmacy*  Lab Work:  If you have labs (blood work) drawn today and your tests are completely normal, you will receive your results only by: MyChart Message (if you have MyChart) OR A paper copy in the mail If you have any lab test that is abnormal or we need to change your treatment, we will call you to review the results.  Testing/Procedures:   Follow-Up: At [facility], you and your health needs are our priority.  As part of our continuing mission to provide you with exceptional heart care, our providers are all part of one team.  This team includes your primary Cardiologist (physician) and Advanced Practice Providers or APPs (Physician Assistants and Nurse Practitioners) who all work together to provide you with the care you need, when you need it.  Your next appointment:  November/ December 2025 We recommend signing up for the patient portal called "MyChart".  Sign up information is provided on this After Visit Summary.  MyChart is used to connect with patients for Virtual Visits (Telemedicine).  Patients are able to view lab/test results, encounter notes, upcoming appointments, etc.  Non-urgent messages can be sent to your provider as well.   To learn more about what you can do with MyChart, go to [email or web address].   Other Instructions

## 2023-06-12 ENCOUNTER — Ambulatory Visit (INDEPENDENT_AMBULATORY_CARE_PROVIDER_SITE_OTHER)

## 2023-06-12 ENCOUNTER — Encounter: Payer: Self-pay | Admitting: Podiatry

## 2023-06-12 ENCOUNTER — Ambulatory Visit (INDEPENDENT_AMBULATORY_CARE_PROVIDER_SITE_OTHER): Admitting: Podiatry

## 2023-06-12 VITALS — Ht 75.0 in | Wt 245.0 lb

## 2023-06-12 DIAGNOSIS — Z969 Presence of functional implant, unspecified: Secondary | ICD-10-CM

## 2023-06-12 DIAGNOSIS — M4316 Spondylolisthesis, lumbar region: Secondary | ICD-10-CM | POA: Diagnosis not present

## 2023-06-12 NOTE — Progress Notes (Signed)
  Subjective:  Patient ID: Jeffery Wood, male    DOB: [birth year],  MRN: [id number]  Chief Complaint  Patient presents with   Routine Post Op    RM3: POV # 1 DOS 06/06/23 RT GREAT TOE SCREW REMOVALClydia Dart PT)      68 y.o. male returns for post-op check.   Review of Systems: Negative except as noted in the HPI. Denies N/V/F/Ch.   Objective:  There were no vitals filed for this visit. Body mass index is 30.62 kg/m. Constitutional Well developed. Well nourished.  Vascular Foot warm and well perfused. Capillary refill normal to all digits.  Calf is soft and supple, no posterior calf or knee pain, negative Homans' sign  Neurologic Normal speech. Oriented to person, place, and time. Epicritic sensation to light touch grossly present bilaterally.  Dermatologic Skin healing well without signs of infection. Skin edges well coapted without signs of infection.  Orthopedic: Tenderness to palpation noted about the surgical site.   Multiple view plain film radiographs: Interval IPJ screw removal Assessment:   1. Presence of retained hardware    Plan:  Patient was evaluated and treated and all questions answered.  S/p foot surgery right -Progressing as expected post-operatively. -XR: Reviewed with patient - WBAT, he has gone back to using regular shoe gear -Sterile gauze and Coban dressing applied today.  He may shower tomorrow and dress daily with this as well. - Return as scheduled for suture removal  No follow-ups on file.

## 2023-06-13 ENCOUNTER — Other Ambulatory Visit: Payer: Self-pay

## 2023-06-13 ENCOUNTER — Other Ambulatory Visit (HOSPITAL_COMMUNITY): Payer: Self-pay

## 2023-06-13 NOTE — Progress Notes (Unsigned)
 Subjective:    Patient ID: Jeffery Wood, male    DOB: 1955-08-18, 68 y.o.   MRN: [id number]  [07/22/13- Dr Bennetta Braun- From review ol notes: The patient is a 68 year old male who I've been asked to see for management of obstructive sleep apnea.  He was apparently diagnosed 10 years ago, and was tried on CPAP with very poor tolerance. He tells me he was tried on all different types of machines, pressure settings, and different masks without success. He could simply not keep the mask on his face. This is gone untreated over the years, and he recently underwent a home sleep test in March of this year which showed moderate OSA. He had an AHI of 24 events per hour with desaturation as low as 85%. The patient states that he is still having loud snoring, and bed partner comments on witnessed apneas.  He has frequent awakenings at night, and is not rested in the mornings upon arising. He has significant daytime sleepiness with any inactivity, and will fall asleep in the evenings watching television or movies. His Epworth score today is 22, and the patient tells that he has lost 30 pounds over the last 2 years.] PFT 11/23/21- Severe obstruction with signif response to BD, Nl DLCO ------------------------------------------  11/07/22-  66 yoM former smoker( 60 pk yrs) followed for COPD, complicated by HTN, DM, CAD, OSA/ failed CPAP, Insomnia, GERD, Neuromuscular Disorder, Psoriasis, Lumbar Spondylolisthesis, BPH, Non-Hodkins Lymphoma, Gout,  -Neb albuterol , albuterol  hfa, Advair  100, Spiriva ,  ------Breathing has not been the best  ACT 14 Had flu vax. Trelegy worked best for him as a maintenance bronchodilator but a pharmacist told him it might affect his memory, which is a concern for him.  He is now only using his Xopenex  HFA inhaler.  We discussed options.  He will be seeing neurology next month for memory assessment. CXR 10/31/22- IMPRESSION: No active cardiopulmonary disease.  06/14/23- 53 yoM former  smoker( 60 pk yrs) followed for COPD, complicated by HTN, DM, CAD, OSA/ failed CPAP, Insomnia, GERD, Neuromuscular Disorder, Psoriasis, Lumbar Spondylolisthesis, BPH, Non-Hodkins Lymphoma, Gout,  -Neb albuterol , albuterol  hfa, Advair  100, Spiriva ,      ROS-see HPI   + = positive Constitutional:    weight loss, night sweats, fevers, chills, fatigue, lassitude. HEENT:    headaches, difficulty swallowing, tooth/dental problems, sore throat,       sneezing, itching, ear ache, nasal congestion, post nasal drip, snoring CV:    chest pain, orthopnea, PND, swelling in lower extremities, anasarca,                                   dizziness, palpitations Resp:   +shortness of breath with exertion or at rest.                productive cough,   +non-productive cough, coughing up of blood.              change in color of mucus.  wheezing.   Skin:    rash or lesions. GI:  No-   heartburn, indigestion, abdominal pain, nausea, vomiting, diarrhea,                 change in bowel habits, loss of appetite GU: dysuria, change in color of urine, no urgency or frequency.   flank pain. MS:   joint pain, stiffness, decreased range of motion, back pain. Neuro-  nothing unusual Psych:  change in mood or affect.  depression or anxiety.   memory loss.  OBJ- Physical Exam General- Alert, Oriented, Affect-appropriate, Distress- none acute, +full beard Skin- rash-none, lesions- none, excoriation- none Lymphadenopathy- none Head- atraumatic            Eyes- Gross vision intact, PERRLA, conjunctivae and secretions clear            Ears- Hearing, canals-normal            Nose- Clear, no-Septal dev, mucus, polyps, erosion, perforation             Throat- Mallampati II , mucosa clear , drainage- none, tonsils- atrophic Neck- flexible , trachea midline, no stridor , thyroid  nl, carotid no bruit Chest - symmetrical excursion , unlabored           Heart/CV- RRR , no murmur , no gallop  , no rub, nl s1 s2                            - JVD- none , edema- none, stasis changes- none, varices- none           Lung- +diminished, wheeze+R back, cough- none , dullness-none, rub- none           Chest wall-  Abd-  Br/ Gen/ Rectal- Not done, not indicated Extrem- cyanosis- none, clubbing, none, atrophy- none, strength- nl Neuro- grossly intact to observation     Assessment & Plan:

## 2023-06-14 ENCOUNTER — Encounter: Payer: Self-pay | Admitting: Internal Medicine

## 2023-06-14 ENCOUNTER — Ambulatory Visit: Admitting: Internal Medicine

## 2023-06-14 ENCOUNTER — Other Ambulatory Visit: Payer: Self-pay

## 2023-06-14 ENCOUNTER — Other Ambulatory Visit (HOSPITAL_COMMUNITY): Payer: Self-pay

## 2023-06-14 VITALS — BP 138/80 | HR 63 | Ht 75.0 in | Wt 250.1 lb

## 2023-06-14 DIAGNOSIS — Z87891 Personal history of nicotine dependence: Secondary | ICD-10-CM

## 2023-06-14 DIAGNOSIS — J449 Chronic obstructive pulmonary disease, unspecified: Secondary | ICD-10-CM | POA: Diagnosis not present

## 2023-06-14 DIAGNOSIS — J069 Acute upper respiratory infection, unspecified: Secondary | ICD-10-CM | POA: Diagnosis not present

## 2023-06-14 MED ORDER — AZITHROMYCIN 250 MG PO TABS
ORAL_TABLET | ORAL | 0 refills | Status: DC
Start: 2023-06-14 — End: 2023-08-30
  Filled 2023-06-14: qty 6, 5d supply, fill #0

## 2023-06-14 NOTE — Patient Instructions (Signed)
 Script sent for Zpak antibiotic. You ca go ahead and start this and just overlap it while you finish the Keflex .  Keep on with your inhalers  Please call if we can help

## 2023-06-18 ENCOUNTER — Other Ambulatory Visit (HOSPITAL_COMMUNITY): Payer: Self-pay

## 2023-06-19 ENCOUNTER — Other Ambulatory Visit (HOSPITAL_COMMUNITY): Payer: Self-pay

## 2023-06-21 ENCOUNTER — Ambulatory Visit (INDEPENDENT_AMBULATORY_CARE_PROVIDER_SITE_OTHER): Admitting: Podiatry

## 2023-06-21 ENCOUNTER — Encounter: Payer: Self-pay | Admitting: Podiatry

## 2023-06-21 DIAGNOSIS — Z969 Presence of functional implant, unspecified: Secondary | ICD-10-CM

## 2023-06-24 ENCOUNTER — Other Ambulatory Visit: Payer: Self-pay | Admitting: Family Medicine

## 2023-06-24 ENCOUNTER — Other Ambulatory Visit: Payer: Self-pay | Admitting: Internal Medicine

## 2023-06-25 NOTE — Progress Notes (Signed)
  Subjective:  Patient ID: Jeffery Wood, male    DOB: [birth year],  MRN: [id number]  Chief Complaint  Patient presents with   Routine Post Op    Great toe srew removal follow up. 0 pain. NIDDM A1C 7.0       68 y.o. male returns for post-op check.  He has been doing well not having any pain.  He presents today for suture removal.  Denies any fevers or chills.  Review of Systems: Negative except as noted in the HPI.    Objective:  There were no vitals filed for this visit. Body mass index is 30.62 kg/m. Constitutional Well developed. Well nourished.  Vascular Foot warm and well perfused. Capillary refill normal to all digits.  Calf is soft and supple, no posterior calf or knee pain, negative Homans' sign  Neurologic Normal speech. Oriented to person, place, and time. Epicritic sensation to light touch grossly present bilaterally.  Dermatologic Incision well coapted with sutures intact.  There is no surrounding erythema, ascending cellulitis.  There is no fluctuation or crepitation.  There is no malodor.  Orthopedic: There is no significant tenderness to palpation noted about the surgical site.   Assessment:   1. Presence of retained hardware    Plan:  Patient was evaluated and treated and all questions answered.  S/p foot surgery right -Progressing as expected post-operatively.  Right with sutures today without complications.  Digit is healing well. -He is already wearing a regular shoe and he can continue the regular shoe as tolerated. -Ice, elevation -Monitor for any clinical signs or symptoms of infection and directed to call the office immediately should any occur or go to the ER.  Return for post-op as scheduled.  Charity Conch DPM

## 2023-06-26 ENCOUNTER — Other Ambulatory Visit (HOSPITAL_COMMUNITY): Payer: Self-pay

## 2023-06-26 ENCOUNTER — Other Ambulatory Visit: Payer: Self-pay

## 2023-06-26 MED ORDER — GLIMEPIRIDE 4 MG PO TABS
2.0000 mg | ORAL_TABLET | Freq: Every day | ORAL | 1 refills | Status: DC
  Filled 2023-06-26: qty 45, 90d supply, fill #0
  Filled 2023-09-24: qty 45, 90d supply, fill #1
  Filled 2023-12-18: qty 30, 60d supply, fill #2

## 2023-06-27 ENCOUNTER — Other Ambulatory Visit (HOSPITAL_COMMUNITY): Payer: Self-pay

## 2023-06-27 ENCOUNTER — Other Ambulatory Visit: Payer: Self-pay

## 2023-06-27 ENCOUNTER — Other Ambulatory Visit: Payer: Self-pay | Admitting: Family Medicine

## 2023-06-27 MED ORDER — CLOPIDOGREL BISULFATE 75 MG PO TABS
75.0000 mg | ORAL_TABLET | Freq: Every day | ORAL | 3 refills | Status: AC
  Filled 2023-06-27: qty 90, 90d supply, fill #0
  Filled 2023-09-24: qty 90, 90d supply, fill #1
  Filled 2023-12-24: qty 90, 90d supply, fill #2

## 2023-06-27 MED ORDER — TAMSULOSIN HCL 0.4 MG PO CAPS
0.4000 mg | ORAL_CAPSULE | Freq: Every day | ORAL | 1 refills | Status: AC
  Filled 2023-06-27 – 2023-09-11 (×2): qty 90, 90d supply, fill #0
  Filled 2023-12-10: qty 90, 90d supply, fill #1

## 2023-06-27 MED ORDER — AZELASTINE HCL 0.1 % NA SOLN
2.0000 | Freq: Two times a day (BID) | NASAL | 12 refills | Status: AC
  Filled 2023-06-27: qty 30, 25d supply, fill #0
  Filled 2023-09-24: qty 30, 25d supply, fill #1
  Filled 2024-01-26: qty 30, 25d supply, fill #2

## 2023-07-02 ENCOUNTER — Other Ambulatory Visit (HOSPITAL_COMMUNITY): Payer: Self-pay

## 2023-07-05 ENCOUNTER — Ambulatory Visit (INDEPENDENT_AMBULATORY_CARE_PROVIDER_SITE_OTHER)

## 2023-07-05 ENCOUNTER — Other Ambulatory Visit

## 2023-07-05 ENCOUNTER — Ambulatory Visit (INDEPENDENT_AMBULATORY_CARE_PROVIDER_SITE_OTHER): Admitting: Podiatry

## 2023-07-05 DIAGNOSIS — M2041 Other hammer toe(s) (acquired), right foot: Secondary | ICD-10-CM | POA: Diagnosis not present

## 2023-07-05 NOTE — Progress Notes (Signed)
  Subjective:  Patient ID: Jeffery Wood, male    DOB: September 16, 1955,  MRN: [id number]  Chief Complaint  Patient presents with   Routine Post Op    RM#13 POV # 3 DOS 06/06/23 RT [facility] Patient states doing well just concerned about movement and callus.       68 y.o. male returns for post-op check.  He has been doing well not having any pain.  States he wants to get a tool to help increase the range of motion to his foot.  He states that since the surgery the pain in the other toes has been relieved he is not having any issues with his foot.   Objective:   Constitutional Well developed. Well nourished.  Vascular Foot warm and well perfused. Capillary refill normal to all digits.  Calf is soft and supple, no posterior calf or knee pain, negative Homans' sign  Neurologic Normal speech. Oriented to person, place, and time. Epicritic sensation to light touch grossly present bilaterally.  Dermatologic Incision well coapted.  There is mild scabbing present but there is no erythema or warmth.  No ascending cellulitis.  There is minimal edema.  No signs of infection.  Orthopedic: No pain on exam.  Hammertoes are present.   Assessment:   1. Presence of retained hardware    Plan:  Patient was evaluated and treated and all questions answered.  S/p foot surgery right -X-rays obtained reviewed.  Multiple views obtained.  Status post hard removal of the hallux with some bone loss distally but this is from where the bone was removed during surgery to get the screw out. -At this time he is doing well and having any pain.  The pain to the hammertoes has resolved since the screws removed.  He states that he wants to take at school to help strengthen his big toe and advised him against this as he would likely break his toe. -Since he is doing well with discharge and the postoperative course.  Do recommend 68-month evaluation given neuropathy, diabetes.  In the meantime  should anything change to let me know and he is well aware he can contact me anytime if needed. -Monitor for any clinical signs or symptoms of infection and directed to call the office immediately should any occur or go to the ER.  Return in about 6 months (around 01/04/2024).  Charity Conch DPM

## 2023-07-06 ENCOUNTER — Ambulatory Visit: Admitting: Physician Assistant

## 2023-07-09 ENCOUNTER — Other Ambulatory Visit: Payer: Self-pay | Admitting: Podiatry

## 2023-07-09 ENCOUNTER — Other Ambulatory Visit: Payer: Self-pay

## 2023-07-10 ENCOUNTER — Other Ambulatory Visit: Payer: Self-pay | Admitting: Podiatry

## 2023-07-10 ENCOUNTER — Encounter: Payer: Self-pay | Admitting: Pharmacist

## 2023-07-10 ENCOUNTER — Other Ambulatory Visit: Payer: Self-pay

## 2023-07-10 ENCOUNTER — Other Ambulatory Visit (HOSPITAL_COMMUNITY): Payer: Self-pay

## 2023-07-11 ENCOUNTER — Encounter: Payer: Self-pay | Admitting: Podiatry

## 2023-07-11 ENCOUNTER — Ambulatory Visit: Admitting: Podiatry

## 2023-07-11 ENCOUNTER — Other Ambulatory Visit (HOSPITAL_COMMUNITY): Payer: Self-pay

## 2023-07-11 ENCOUNTER — Other Ambulatory Visit: Payer: Self-pay

## 2023-07-11 ENCOUNTER — Ambulatory Visit: Admitting: Physician Assistant

## 2023-07-11 DIAGNOSIS — S90212A Contusion of left great toe with damage to nail, initial encounter: Secondary | ICD-10-CM

## 2023-07-11 MED ORDER — OXYCODONE-ACETAMINOPHEN 5-325 MG PO TABS
1.0000 | ORAL_TABLET | ORAL | 0 refills | Status: AC | PRN
  Filled 2023-07-11: qty 20, 4d supply, fill #0

## 2023-07-12 NOTE — Progress Notes (Signed)
 Subjective:   Patient ID: Jeffery Wood, male   DOB: 68 y.o.   MRN: [id number]   HPI Patient presents concerned because the nailbed came off left he has history of diabetes and he wanted to have this checked.  He has been given antibiotics not sure if he should take   ROS      Objective:  Physical Exam  Neurovascular status unchanged with left hallux nail bed it has been removed and fallen off and he then removed partial tissue that was left.  It is localized there is no current edema erythema or drainage associated with it patient is a diabetic and has other issues in the past     Assessment:  Traumatized left hallux nail bed that I do think overall is healing fine with just localized irritation     Plan:  Age P reviewed and we will begin soaks which I explained to do and he will use bandage when he is out and about we will try to hold off on the antibiotics and I reviewed that with him and if any redness or drainage or discomfort were to occur I want him to initiate the antibiotic that he received

## 2023-07-16 ENCOUNTER — Other Ambulatory Visit: Payer: Self-pay

## 2023-07-19 ENCOUNTER — Other Ambulatory Visit (HOSPITAL_COMMUNITY): Payer: Self-pay

## 2023-07-20 ENCOUNTER — Other Ambulatory Visit (HOSPITAL_COMMUNITY): Payer: Self-pay

## 2023-07-30 ENCOUNTER — Other Ambulatory Visit (HOSPITAL_COMMUNITY): Payer: Self-pay

## 2023-07-31 ENCOUNTER — Other Ambulatory Visit (HOSPITAL_COMMUNITY): Payer: Self-pay

## 2023-07-31 ENCOUNTER — Other Ambulatory Visit: Payer: Self-pay

## 2023-07-31 MED ORDER — TRAMADOL HCL 50 MG PO TABS
50.0000 mg | ORAL_TABLET | Freq: Three times a day (TID) | ORAL | 1 refills | Status: DC | PRN
  Filled 2023-07-31: qty 50, 17d supply, fill #0
  Filled 2023-09-18: qty 50, 17d supply, fill #1

## 2023-08-12 ENCOUNTER — Encounter (HOSPITAL_COMMUNITY): Payer: Self-pay

## 2023-08-12 ENCOUNTER — Other Ambulatory Visit: Payer: Self-pay | Admitting: Family Medicine

## 2023-08-13 ENCOUNTER — Other Ambulatory Visit: Payer: Self-pay

## 2023-08-13 ENCOUNTER — Other Ambulatory Visit (HOSPITAL_COMMUNITY): Payer: Self-pay

## 2023-08-13 MED ORDER — GABAPENTIN 800 MG PO TABS
1200.0000 mg | ORAL_TABLET | Freq: Three times a day (TID) | ORAL | 1 refills | Status: DC
  Filled 2023-08-13: qty 90, 20d supply, fill #0
  Filled 2023-08-28: qty 90, 20d supply, fill #1

## 2023-08-14 ENCOUNTER — Other Ambulatory Visit: Payer: Self-pay

## 2023-08-21 ENCOUNTER — Other Ambulatory Visit: Payer: Self-pay | Admitting: Family Medicine

## 2023-08-21 ENCOUNTER — Other Ambulatory Visit (HOSPITAL_COMMUNITY): Payer: Self-pay

## 2023-08-21 MED ORDER — METFORMIN HCL 1000 MG PO TABS
1000.0000 mg | ORAL_TABLET | Freq: Two times a day (BID) | ORAL | 0 refills | Status: DC
  Filled 2023-08-21: qty 180, 90d supply, fill #0

## 2023-08-28 ENCOUNTER — Other Ambulatory Visit: Payer: Self-pay

## 2023-08-29 NOTE — Progress Notes (Unsigned)
 Subjective:    Patient ID: Jeffery Wood, male    DOB: 08-May-1955, 68 y.o.   MRN: [id number]  [07/22/13- Dr Corrie- From review ol notes: The patient is a 68 year old male who I've been asked to see for management of obstructive sleep apnea.  He was apparently diagnosed 10 years ago, and was tried on CPAP with very poor tolerance. He tells me he was tried on all different types of machines, pressure settings, and different masks without success. He could simply not keep the mask on his face. This is gone untreated over the years, and he recently underwent a home sleep test in March of this year which showed moderate OSA. He had an AHI of 24 events per hour with desaturation as low as 85%. The patient states that he is still having loud snoring, and bed partner comments on witnessed apneas.  He has frequent awakenings at night, and is not rested in the mornings upon arising. He has significant daytime sleepiness with any inactivity, and will fall asleep in the evenings watching television or movies. His Epworth score today is 22, and the patient tells that he has lost 30 pounds over the last 2 years.] PFT 11/23/21- Severe obstruction with signif response to BD, Nl DLCO ------------------------------------------   06/14/23- 67 yoM former smoker( 60 pk yrs) followed for COPD, complicated by HTN, DM, CAD, OSA/ failed CPAP, Insomnia, GERD, Neuromuscular Disorder, Psoriasis, Lumbar Spondylolisthesis, BPH, Non-Hodkins Lymphoma, Gout,  -Neb Duoneb, xopenex  hfa, Trelegy 100   Body weight today 250 lbs Discussed the use of AI scribe software for clinical note transcription with the patient, who gave verbal consent to proceed.  History of Present Illness   Jeffery Wood is a 68 year old male who presents with upper respiratory symptoms and concerns about potential progression to a chest infection.  He has experienced congestion and a raw throat for the past couple of days, suggesting an impending cold.  Nasal mucus has turned dark green, without headache or earache yet. He is currently on Keflex  following foot surgery, with a few days remaining in the course. He is worried about the symptoms progressing to his chest, which has previously impaired his functioning. Headaches are occasional and managed with Tylenol . He uses a Xopenex  rescue inhaler and a Trelegy inhaler, having switched from Advair  and Spiriva . Azithromycin  has been effective for him in the past for similar respiratory issues.  He has no known medication allergies.    He is very pleased with Trelegy.  Assessment and Plan:    Upper respiratory infection Recent upper respiratory symptoms with concern for bacterial progression. Current Keflex  not targeting symptoms. Previous positive response to azithromycin . This began as viral pattern, but may be developing bacterial superinfection. - Prescribed azithromycin  for potential sinus infection. - Advised overlapping azithromycin  with Keflex  for a few days while finishing kefles.. - Instructed to monitor for symptom progression, especially chest involvement.     COPD mixed type -Continue Trelegy and albuterol  hfa   08/29/23-  67 yoM former smoker( 60 pk yrs) followed for COPD, complicated by HTN, DM, CAD, OSA/ failed CPAP, Insomnia, GERD, Neuromuscular Disorder, Psoriasis, Lumbar Spondylolisthesis, BPH, Non-Hodkins Lymphoma, Gout,  -Neb Duoneb, xopenex  hfa, Trelegy 100   Breathing has been good despite humidity this summer with little cough or wheeze. Very pleased with Trelegy. No longer has much need for rescue inhaler. Has decided now that he does better with vaccines, converted from being an antivaxer. Agrees to CXR. Followed by cardiology with  no new events.  ROS-see HPI   + = positive Constitutional:    weight loss, night sweats, fevers, chills, fatigue, lassitude. HEENT:    headaches, difficulty swallowing, tooth/dental problems, sore throat,       sneezing, itching, ear ache,  nasal congestion, post nasal drip, snoring CV:    chest pain, orthopnea, PND, swelling in lower extremities, anasarca,                                   dizziness, palpitations Resp:   +shortness of breath with exertion or at rest.                productive cough,   +non-productive cough, coughing up of blood.              change in color of mucus.  wheezing.   Skin:    rash or lesions. GI:  No-   heartburn, indigestion, abdominal pain, nausea, vomiting, diarrhea,                 change in bowel habits, loss of appetite GU: dysuria, change in color of urine, no urgency or frequency.   flank pain. MS:   joint pain, stiffness, decreased range of motion, back pain. Neuro-     nothing unusual Psych:  change in mood or affect.  depression or anxiety.   memory loss.  OBJ- Physical Exam General- Alert, Oriented, Affect-appropriate, Distress- none acute, +full beard Skin- rash-none, lesions- none, excoriation- none, +tanned Lymphadenopathy- none Head- atraumatic            Eyes- Gross vision intact, PERRLA, conjunctivae and secretions clear            Ears- Hearing, canals-normal            Nose- Clear, no-Septal dev, mucus, polyps, erosion, perforation             Throat- Mallampati II , mucosa clear , drainage- none, tonsils- atrophic Neck- flexible , trachea midline, no stridor , thyroid  nl, carotid no bruit Chest - symmetrical excursion , unlabored           Heart/CV- RRR , no murmur , no gallop  , no rub, nl s1 s2                           - JVD- none , edema- none, stasis changes- none, varices- none           Lung- +diminished, wheeze-none, cough- none , dullness-none, rub- none           Chest wall-  Abd-  Br/ Gen/ Rectal- Not done, not indicated Extrem- cyanosis- none, clubbing, none, atrophy- none, strength- nl Neuro- grossly intact to observation     Assessment & Plan:

## 2023-08-30 ENCOUNTER — Encounter: Payer: Self-pay | Admitting: Internal Medicine

## 2023-08-30 ENCOUNTER — Ambulatory Visit: Payer: Medicare PPO | Admitting: Internal Medicine

## 2023-08-30 ENCOUNTER — Ambulatory Visit

## 2023-08-30 VITALS — BP 114/67 | HR 57 | Temp 97.5°F | Ht 75.0 in | Wt 236.6 lb

## 2023-08-30 DIAGNOSIS — R0602 Shortness of breath: Secondary | ICD-10-CM

## 2023-08-30 DIAGNOSIS — J449 Chronic obstructive pulmonary disease, unspecified: Secondary | ICD-10-CM

## 2023-08-30 DIAGNOSIS — I2510 Atherosclerotic heart disease of native coronary artery without angina pectoris: Secondary | ICD-10-CM | POA: Diagnosis not present

## 2023-08-30 NOTE — Patient Instructions (Signed)
 Order- CXR  dx COPD mixed type  Glad the Trelegy works well for you. Please call if we can help

## 2023-08-30 NOTE — Assessment & Plan Note (Signed)
 Followed by cardiology

## 2023-08-30 NOTE — Assessment & Plan Note (Signed)
 Doing well with Trelegy Plan- continue meds. CXR

## 2023-09-05 ENCOUNTER — Ambulatory Visit: Payer: Self-pay | Admitting: Internal Medicine

## 2023-09-05 ENCOUNTER — Telehealth: Payer: Self-pay

## 2023-09-05 DIAGNOSIS — R911 Solitary pulmonary nodule: Secondary | ICD-10-CM

## 2023-09-05 NOTE — Telephone Encounter (Signed)
 Copied from CRM #[id number]. Topic: Clinical - Lab/Test Results >> Sep 04, 2023 12:06 PM Chantha C wrote: Reason for CRM: MJ from [facility] radiolology [phone] asking if the office can see the chest xray from 08/30/23. Informed MJ, the results visible.    Spoke w/ GR we can see image    NFN-

## 2023-09-06 ENCOUNTER — Encounter: Payer: Self-pay | Admitting: Oncology

## 2023-09-11 ENCOUNTER — Other Ambulatory Visit (HOSPITAL_COMMUNITY): Payer: Self-pay

## 2023-09-11 ENCOUNTER — Encounter: Payer: Self-pay | Admitting: Internal Medicine

## 2023-09-12 ENCOUNTER — Ambulatory Visit
Admission: RE | Admit: 2023-09-12 | Discharge: 2023-09-12 | Disposition: A | Discharge status: Home / Self Care | Source: Ambulatory Visit | Attending: Internal Medicine | Admitting: Internal Medicine

## 2023-09-12 DIAGNOSIS — R590 Localized enlarged lymph nodes: Secondary | ICD-10-CM | POA: Diagnosis not present

## 2023-09-12 DIAGNOSIS — R911 Solitary pulmonary nodule: Secondary | ICD-10-CM | POA: Diagnosis not present

## 2023-09-18 ENCOUNTER — Telehealth: Payer: Self-pay | Admitting: *Deleted

## 2023-09-18 ENCOUNTER — Telehealth: Payer: Self-pay | Admitting: Internal Medicine

## 2023-09-18 NOTE — Telephone Encounter (Signed)
 PT calling concerned with results. Tsf to Triage.

## 2023-09-18 NOTE — Telephone Encounter (Signed)
 Spoke with the patient again today. Pt is very stressed about CT scan from 8/13. I spoke with the patient about 30 mins ago and advised CT has not been read yet.  I am routing to Dr. Neysa to advise on CT results once available.

## 2023-09-18 NOTE — Telephone Encounter (Signed)
 Duplicate message patient also called and left a message in which has been routed to Dr. Neysa closing this message.

## 2023-09-18 NOTE — Telephone Encounter (Signed)
 Copied from CRM #[id number]. Topic: Clinical - Lab/Test Results >> Sep 17, 2023  3:50 PM Rilla B wrote: Patient stating her had an xray and also a CT and is wondering why he has not heard from office.  Patient would like a call to discuss results.  Please call patient @ [phone].   Called and spoke with the patient.  I re-reviewed x-ray results.  CRX- There is a rounded shadow in the right lung that needs to be looked at more closely.  I advised pt CT has not been read yet and we will contact the pt once this has been reviewed.

## 2023-09-19 ENCOUNTER — Other Ambulatory Visit (HOSPITAL_COMMUNITY): Payer: Self-pay

## 2023-09-19 ENCOUNTER — Other Ambulatory Visit: Payer: Self-pay

## 2023-09-19 ENCOUNTER — Ambulatory Visit: Payer: Self-pay | Admitting: Internal Medicine

## 2023-09-19 NOTE — Telephone Encounter (Signed)
 CT in RESULTS

## 2023-09-19 NOTE — Telephone Encounter (Signed)
 Pt notified by Dr. Neysa in Mychart message

## 2023-09-24 ENCOUNTER — Other Ambulatory Visit: Payer: Self-pay | Admitting: Family Medicine

## 2023-09-24 ENCOUNTER — Other Ambulatory Visit (HOSPITAL_COMMUNITY): Payer: Self-pay

## 2023-09-24 ENCOUNTER — Other Ambulatory Visit: Payer: Self-pay

## 2023-09-24 MED ORDER — GABAPENTIN 800 MG PO TABS
1200.0000 mg | ORAL_TABLET | Freq: Three times a day (TID) | ORAL | 1 refills | Status: DC
  Filled 2023-09-24: qty 90, 20d supply, fill #0
  Filled 2023-10-22: qty 90, 20d supply, fill #1

## 2023-10-02 ENCOUNTER — Other Ambulatory Visit: Payer: Self-pay | Admitting: Internal Medicine

## 2023-10-03 ENCOUNTER — Other Ambulatory Visit (HOSPITAL_COMMUNITY): Payer: Self-pay

## 2023-10-03 MED ORDER — ATORVASTATIN CALCIUM 40 MG PO TABS
40.0000 mg | ORAL_TABLET | Freq: Every day | ORAL | 2 refills | Status: AC
  Filled 2023-10-03 – 2023-12-10 (×2): qty 90, 90d supply, fill #0

## 2023-10-10 ENCOUNTER — Other Ambulatory Visit (HOSPITAL_COMMUNITY): Payer: Self-pay

## 2023-10-17 ENCOUNTER — Other Ambulatory Visit: Payer: Self-pay

## 2023-10-17 ENCOUNTER — Other Ambulatory Visit: Payer: Self-pay | Admitting: Family Medicine

## 2023-10-17 ENCOUNTER — Other Ambulatory Visit (HOSPITAL_COMMUNITY): Payer: Self-pay

## 2023-10-17 MED ORDER — TRAZODONE HCL 50 MG PO TABS
25.0000 mg | ORAL_TABLET | Freq: Every evening | ORAL | 3 refills | Status: DC | PRN
  Filled 2023-10-17: qty 30, 30d supply, fill #0
  Filled 2023-10-30 – 2023-11-09 (×2): qty 30, 30d supply, fill #1
  Filled 2023-12-13: qty 30, 30d supply, fill #2
  Filled 2024-01-24: qty 30, 30d supply, fill #3

## 2023-10-22 ENCOUNTER — Other Ambulatory Visit (HOSPITAL_COMMUNITY): Payer: Self-pay

## 2023-10-23 NOTE — Progress Notes (Signed)
 Jeffery Wood                                          MRN: [id number]   10/23/2023   The [facility] Quality Team Specialist reviewed this patient medical record for the purposes of chart review for care gap closure. The following were reviewed: chart review for care gap closure-glycemic status assessment and kidney health evaluation for diabetes:eGFR  and uACR.    [facility] Quality Team

## 2023-10-25 ENCOUNTER — Telehealth: Payer: Self-pay

## 2023-10-25 NOTE — Telephone Encounter (Signed)
 The patient's appointment has been rescheduled to February 07, 2023, to accommodate the provider's updated office hours. A letter confirming the new appointment date has been mailed to the patient.

## 2023-10-30 ENCOUNTER — Other Ambulatory Visit (HOSPITAL_COMMUNITY): Payer: Self-pay

## 2023-10-31 ENCOUNTER — Other Ambulatory Visit (HOSPITAL_COMMUNITY): Payer: Self-pay

## 2023-10-31 ENCOUNTER — Other Ambulatory Visit: Payer: Self-pay

## 2023-10-31 MED ORDER — TRAMADOL HCL 50 MG PO TABS
50.0000 mg | ORAL_TABLET | Freq: Three times a day (TID) | ORAL | 1 refills | Status: DC | PRN
  Filled 2023-10-31: qty 50, 17d supply, fill #0
  Filled 2023-12-28: qty 50, 17d supply, fill #1

## 2023-11-04 ENCOUNTER — Other Ambulatory Visit: Payer: Self-pay | Admitting: Family Medicine

## 2023-11-05 ENCOUNTER — Other Ambulatory Visit (HOSPITAL_COMMUNITY): Payer: Self-pay

## 2023-11-05 MED ORDER — GABAPENTIN 800 MG PO TABS
1200.0000 mg | ORAL_TABLET | Freq: Three times a day (TID) | ORAL | 1 refills | Status: DC
  Filled 2023-11-05 – 2023-11-18 (×2): qty 90, 20d supply, fill #0
  Filled 2023-12-10: qty 90, 20d supply, fill #1

## 2023-11-06 ENCOUNTER — Other Ambulatory Visit (HOSPITAL_COMMUNITY): Payer: Self-pay

## 2023-11-18 ENCOUNTER — Other Ambulatory Visit: Payer: Self-pay | Admitting: Internal Medicine

## 2023-11-19 ENCOUNTER — Encounter: Payer: Self-pay | Admitting: Family Medicine

## 2023-11-19 ENCOUNTER — Ambulatory Visit

## 2023-11-19 ENCOUNTER — Other Ambulatory Visit (HOSPITAL_COMMUNITY): Payer: Self-pay

## 2023-11-19 ENCOUNTER — Other Ambulatory Visit: Payer: Self-pay

## 2023-11-19 ENCOUNTER — Ambulatory Visit: Payer: Medicare PPO | Admitting: Family Medicine

## 2023-11-19 VITALS — BP 132/82 | HR 66 | Temp 97.0°F | Ht 75.0 in | Wt 238.6 lb

## 2023-11-19 DIAGNOSIS — N400 Enlarged prostate without lower urinary tract symptoms: Secondary | ICD-10-CM

## 2023-11-19 DIAGNOSIS — Z0001 Encounter for general adult medical examination with abnormal findings: Secondary | ICD-10-CM

## 2023-11-19 DIAGNOSIS — E1165 Type 2 diabetes mellitus with hyperglycemia: Secondary | ICD-10-CM

## 2023-11-19 DIAGNOSIS — M7918 Myalgia, other site: Secondary | ICD-10-CM

## 2023-11-19 DIAGNOSIS — Z043 Encounter for examination and observation following other accident: Secondary | ICD-10-CM | POA: Diagnosis not present

## 2023-11-19 DIAGNOSIS — K5903 Drug induced constipation: Secondary | ICD-10-CM | POA: Diagnosis not present

## 2023-11-19 DIAGNOSIS — Z23 Encounter for immunization: Secondary | ICD-10-CM

## 2023-11-19 DIAGNOSIS — E538 Deficiency of other specified B group vitamins: Secondary | ICD-10-CM | POA: Diagnosis not present

## 2023-11-19 DIAGNOSIS — E785 Hyperlipidemia, unspecified: Secondary | ICD-10-CM

## 2023-11-19 DIAGNOSIS — Z7984 Long term (current) use of oral hypoglycemic drugs: Secondary | ICD-10-CM | POA: Diagnosis not present

## 2023-11-19 DIAGNOSIS — E559 Vitamin D deficiency, unspecified: Secondary | ICD-10-CM | POA: Diagnosis not present

## 2023-11-19 DIAGNOSIS — E1159 Type 2 diabetes mellitus with other circulatory complications: Secondary | ICD-10-CM

## 2023-11-19 DIAGNOSIS — I152 Hypertension secondary to endocrine disorders: Secondary | ICD-10-CM

## 2023-11-19 DIAGNOSIS — E1169 Type 2 diabetes mellitus with other specified complication: Secondary | ICD-10-CM | POA: Diagnosis not present

## 2023-11-19 DIAGNOSIS — M1990 Unspecified osteoarthritis, unspecified site: Secondary | ICD-10-CM | POA: Diagnosis not present

## 2023-11-19 DIAGNOSIS — M792 Neuralgia and neuritis, unspecified: Secondary | ICD-10-CM

## 2023-11-19 LAB — LIPID PANEL
Cholesterol: 107 mg/dL (ref 0–200)
HDL: 39.8 mg/dL (ref >39.00)
LDL Cholesterol: 57 mg/dL (ref 0–99)
NonHDL: 67.6
Total CHOL/HDL Ratio: 3
Triglycerides: 53 mg/dL (ref 0.0–149.0)
VLDL: 10.6 mg/dL (ref 0.0–40.0)

## 2023-11-19 LAB — COMPREHENSIVE METABOLIC PANEL WITH GFR
ALT: 11 U/L (ref 0–53)
AST: 15 U/L (ref 0–37)
Albumin: 4.1 g/dL (ref 3.5–5.2)
Alkaline Phosphatase: 88 U/L (ref 39–117)
BUN: 13 mg/dL (ref 6–23)
CO2: 28 meq/L (ref 19–32)
Calcium: 9 mg/dL (ref 8.4–10.5)
Chloride: 102 meq/L (ref 96–112)
Creatinine, Ser: 0.68 mg/dL (ref 0.40–1.50)
GFR: 95.98 mL/min (ref >60.00)
Glucose, Bld: 71 mg/dL (ref 70–99)
Potassium: 4.4 meq/L (ref 3.5–5.1)
Sodium: 140 meq/L (ref 135–145)
Total Bilirubin: 0.9 mg/dL (ref 0.2–1.2)
Total Protein: 6.1 g/dL (ref 6.0–8.3)

## 2023-11-19 LAB — VITAMIN B12: Vitamin B-12: >1500 pg/mL — ABNORMAL HIGH (ref 211–911)

## 2023-11-19 LAB — CBC
HCT: 42.5 % (ref 39.0–52.0)
Hemoglobin: 14.4 g/dL (ref 13.0–17.0)
MCHC: 33.8 g/dL (ref 30.0–36.0)
MCV: 91.6 fl (ref 78.0–100.0)
Platelets: 243 K/uL (ref 150.0–400.0)
RBC: 4.64 Mil/uL (ref 4.22–5.81)
RDW: 14.2 % (ref 11.5–15.5)
WBC: 6.7 K/uL (ref 4.0–10.5)

## 2023-11-19 LAB — PSA: PSA: 1.21 ng/mL (ref 0.10–4.00)

## 2023-11-19 LAB — TSH: TSH: 1.09 u[IU]/mL (ref 0.35–5.50)

## 2023-11-19 LAB — MICROALBUMIN / CREATININE URINE RATIO
Creatinine,U: 90.7 mg/dL
Microalb Creat Ratio: 10.1 mg/g (ref 0.0–30.0)
Microalb, Ur: 0.9 mg/dL (ref 0.0–1.9)

## 2023-11-19 LAB — HEMOGLOBIN A1C: Hgb A1c MFr Bld: 5.8 % (ref 4.6–6.5)

## 2023-11-19 LAB — VITAMIN D 25 HYDROXY (VIT D DEFICIENCY, FRACTURES): VITD: 38.5 ng/mL (ref 30.00–100.00)

## 2023-11-19 MED ORDER — METHYLPREDNISOLONE ACETATE 80 MG/ML IJ SUSP
80.0000 mg | Freq: Once | INTRAMUSCULAR | Status: AC
  Administered 2023-11-19: 80 mg via INTRAMUSCULAR

## 2023-11-19 MED ORDER — HYDROCORTISONE ACETATE 30 MG RE SUPP
1.0000 | Freq: Two times a day (BID) | RECTAL | 3 refills | Status: AC | PRN
  Filled 2023-11-19 – 2023-11-21 (×2): qty 12, 6d supply, fill #0

## 2023-11-19 MED ORDER — TRELEGY ELLIPTA 100-62.5-25 MCG/ACT IN AEPB
INHALATION_SPRAY | RESPIRATORY_TRACT | 12 refills | Status: AC
  Filled 2023-11-19: qty 60, 30d supply, fill #0
  Filled 2024-01-28: qty 60, 30d supply, fill #1

## 2023-11-19 NOTE — Assessment & Plan Note (Signed)
 Blood pressure at goal today on Imdur  15 mg daily and metoprolol  tartrate 25 mg twice daily

## 2023-11-19 NOTE — Assessment & Plan Note (Signed)
 On Lipitor 40 mg daily.  Check lipids with labs.

## 2023-11-19 NOTE — Assessment & Plan Note (Signed)
 Stable on gabapentin  1200 mg 3 times daily and Cymbalta  30 mg daily.

## 2023-11-19 NOTE — Patient Instructions (Signed)
 It was very nice to see you today!  Will check an x-ray of your hip today.  Will give you 80 mg of Depo-Medrol  today.  I will refer you to see the sports medicine doctor for your other aches and pains.  We gave your flu shot today.  I will send a prescription in for hydrocortisone  suppositories for your hemorrhoids.  Let us  know if not proving in the next 1 to 2 weeks and we will refer you to see a specialist for this.  Please continue to work on diet and exercise.  Will check blood work today.  Return in about 1 year (around 11/18/2024) for Annual Physical.   Take care, Dr Kennyth  PLEASE NOTE:  If you had any lab tests, please let us  know if you have not heard back within a few days. You may see your results on mychart before we have a chance to review them but we will give you a call once they are reviewed by us .   If we ordered any referrals today, please let us  know if you have not heard from their office within the next week.   If you had any urgent prescriptions sent in today, please check with the pharmacy within an hour of our visit to make sure the prescription was transmitted appropriately.   Please try these tips to maintain a healthy lifestyle:  Eat at least 3 REAL meals and 1-2 snacks per day.  Aim for no more than 5 hours between eating.  If you eat breakfast, please do so within one hour of getting up.   Each meal should contain half fruits/vegetables, one quarter protein, and one quarter carbs (no bigger than a computer mouse)  Cut down on sweet beverages. This includes juice, soda, and sweet tea.   Drink at least 1 glass of water with each meal and aim for at least 8 glasses per day  Exercise at least 150 minutes every week.    Preventive Care 73 Years and Older, Male Preventive care refers to lifestyle choices and visits with your health care provider that can promote health and wellness. Preventive care visits are also called wellness exams. What can I  expect for my preventive care visit? Counseling During your preventive care visit, your health care provider may ask about your: Medical history, including: Past medical problems. Family medical history. History of falls. Current health, including: Emotional well-being. Home life and relationship well-being. Sexual activity. Memory and ability to understand (cognition). Lifestyle, including: Alcohol, nicotine or tobacco, and drug use. Access to firearms. Diet, exercise, and sleep habits. Work and work Astronomer. Sunscreen use. Safety issues such as seatbelt and bike helmet use. Physical exam Your health care provider will check your: Height and weight. These may be used to calculate your BMI (body mass index). BMI is a measurement that tells if you are at a healthy weight. Waist circumference. This measures the distance around your waistline. This measurement also tells if you are at a healthy weight and may help predict your risk of certain diseases, such as type 2 diabetes and high blood pressure. Heart rate and blood pressure. Body temperature. Skin for abnormal spots. What immunizations do I need?  Vaccines are usually given at various ages, according to a schedule. Your health care provider will recommend vaccines for you based on your age, medical history, and lifestyle or other factors, such as travel or where you work. What tests do I need? Screening Your health care provider may recommend  screening tests for certain conditions. This may include: Lipid and cholesterol levels. Diabetes screening. This is done by checking your blood sugar (glucose) after you have not eaten for a while (fasting). Hepatitis C test. Hepatitis B test. HIV (human immunodeficiency virus) test. STI (sexually transmitted infection) testing, if you are at risk. Lung cancer screening. Colorectal cancer screening. Prostate cancer screening. Abdominal aortic aneurysm (AAA) screening. You may need  this if you are a current or former smoker. Talk with your health care provider about your test results, treatment options, and if necessary, the need for more tests. Follow these instructions at home: Eating and drinking  Eat a diet that includes fresh fruits and vegetables, whole grains, lean protein, and low-fat dairy products. Limit your intake of foods with high amounts of sugar, saturated fats, and salt. Take vitamin and mineral supplements as recommended by your health care provider. Do not drink alcohol if your health care provider tells you not to drink. If you drink alcohol: Limit how much you have to 0-2 drinks a day. Know how much alcohol is in your drink. In the [place] one drink equals one 12 oz bottle of beer (355 mL), one 5 oz glass of wine (148 mL), or one 1 oz glass of hard liquor (44 mL). Lifestyle Brush your teeth every morning and night with fluoride toothpaste. Floss one time each day. Exercise for at least 30 minutes 5 or more days each week. Do not use any products that contain nicotine or tobacco. These products include cigarettes, chewing tobacco, and vaping devices, such as e-cigarettes. If you need help quitting, ask your health care provider. Do not use drugs. If you are sexually active, practice safe sex. Use a condom or other form of protection to prevent STIs. Take aspirin  only as told by your health care provider. Make sure that you understand how much to take and what form to take. Work with your health care provider to find out whether it is safe and beneficial for you to take aspirin  daily. Ask your health care provider if you need to take a cholesterol-lowering medicine (statin). Find healthy ways to manage stress, such as: Meditation, yoga, or listening to music. Journaling. Talking to a trusted person. Spending time with friends and family. Safety Always wear your seat belt while driving or riding in a vehicle. Do not drive: If you have been drinking  alcohol. Do not ride with someone who has been drinking. When you are tired or distracted. While texting. If you have been using any mind-altering substances or drugs. Wear a helmet and other protective equipment during sports activities. If you have firearms in your house, make sure you follow all gun safety procedures. Minimize exposure to UV radiation to reduce your risk of skin cancer. What's next? Visit your health care provider once a year for an annual wellness visit. Ask your health care provider how often you should have your eyes and teeth checked. Stay up to date on all vaccines. This information is not intended to replace advice given to you by your health care provider. Make sure you discuss any questions you have with your health care provider. Document Revised: 07/14/2020 Document Reviewed: 07/14/2020 Elsevier Patient Education  2024 [employer].

## 2023-11-19 NOTE — Assessment & Plan Note (Signed)
 This has been an ongoing issue for the last couple of years.  He has developed some issues with hemorrhoids recently.  Has only had modest improvement with Preparation H.  We will send prescription in for hydrocortisone  suppositories.  He is working on bowel regimen he has had some issues with MiraLAX  in the past including fecal incontinence and does not wish to restart this at this point.  He will let us  know if not improving and would consider referral to general surgery at that point.

## 2023-11-19 NOTE — Assessment & Plan Note (Signed)
 Check A1c with labs.  He is on metformin  1000 mg twice daily and Amaryl  2 mg daily.  Most recent A1c was well-controlled-doubt he will have any issues with above for steroid injection.

## 2023-11-19 NOTE — Assessment & Plan Note (Signed)
 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  He is currently on Celebrex  and tramadol  however pain persist.  Will give 80 mg Depo-Medrol  today for his above buttocks pain however will refer to sports medicine for further evaluation and management.

## 2023-11-19 NOTE — Progress Notes (Signed)
 Chief Complaint:  Jeffery Wood is a 68 y.o. male who presents today for his annual comprehensive physical exam.    Assessment/Plan:  New/Acute Problems: Right Buttocks Pain No significant abnormalities on exam though given persistence of pain after his fall a couple of months ago, we will check plain film today.  Will give 80 mg Depo-Medrol  today.  He can continue his tramadol  and Celebrex .  Chronic Problems Addressed Today: Osteoarthritis Patient with multifocal arthritis though he is having significant pain in bilateral thumbs.  He is currently on Celebrex  and tramadol  however pain persist.  Will give 80 mg Depo-Medrol  today for his above buttocks pain however will refer to sports medicine for further evaluation and management.  T2DM (type 2 diabetes mellitus) (HCC) Check A1c with labs.  He is on metformin  1000 mg twice daily and Amaryl  2 mg daily.  Most recent A1c was well-controlled-doubt he will have any issues with above for steroid injection.  Hypertension associated with diabetes (HCC) Blood pressure at goal today on Imdur  15 mg daily and metoprolol  tartrate 25 mg twice daily  BPH (benign prostatic hyperplasia) On Flomax  0.4 mg twice daily.  Check PSA.  Dyslipidemia associated with type 2 diabetes mellitus (HCC) On Lipitor 40 mg daily.  Check lipids with labs.  Constipation This has been an ongoing issue for the last couple of years.  He has developed some issues with hemorrhoids recently.  Has only had modest improvement with Preparation H.  We will send prescription in for hydrocortisone  suppositories.  He is working on bowel regimen he has had some issues with MiraLAX  in the past including fecal incontinence and does not wish to restart this at this point.  He will let us  know if not improving and would consider referral to general surgery at that point.  Neuropathic pain Stable on gabapentin  1200 mg 3 times daily and Cymbalta  30 mg daily.   Preventative  Healthcare: Flu shot given today.  Check labs. He is up to date on other vaccines and screenings.   Patient Counseling(The following topics were reviewed and/or handout was given):  -Nutrition: Stressed importance of moderation in sodium/caffeine intake, saturated fat and cholesterol, caloric balance, sufficient intake of fresh fruits, vegetables, and fiber.  -Stressed the importance of regular exercise.   -Substance Abuse: Discussed cessation/primary prevention of tobacco, alcohol, or other drug use; driving or other dangerous activities under the influence; availability of treatment for abuse.   -Injury prevention: Discussed safety belts, safety helmets, smoke detector, smoking near bedding or upholstery.   -Sexuality: Discussed sexually transmitted diseases, partner selection, use of condoms, avoidance of unintended pregnancy and contraceptive alternatives.   -Dental health: Discussed importance of regular tooth brushing, flossing, and dental visits.  -Health maintenance and immunizations reviewed. Please refer to Health maintenance section.  Return to care in 1 year for next preventative visit.     Subjective:  HPI:  He has no acute complaints today. Patient is here today for his annual physical.  See assessment / plan for status of chronic conditions.  Patient has a few additional concerns that he would like to discuss today.  Patient has been having more pain in bilateral thumbs for the last several months.  He has tried over-the-counter Voltaren  without much improvement.  He has been consistent with his prescriptions for Celebrex  and tramadol  however pain persist.  No obvious injuries or precipitating events.  He did have a steroid injection at some point in the past by Dr. Mai with rheumatology however  he does not Rumer how he did with this.  He also has some ongoing issues with hemorrhoids.  This has been an issue for a few years related to his constipation secondary to  chemotherapy from a few years ago.  He has previously tried taking MiraLAX  and stool softeners however developed issues with diarrhea and fecal incontinence and has since discontinued this.  He is not having any bleeding.  He has tried using hydrocortisone  cream without much improvement.  Patient also fell a couple of months ago and landed on his right buttocks.  Since then he has had persistent pain to the area.  Occasionally travels down his right leg.  No numbness or tingling that is different than his typical neuropathic pain.   Lifestyle Diet: none specific.  Exercise: Limited     04/18/2023    8:13 AM  Depression screen PHQ 2/9  Decreased Interest 0  Down, Depressed, Hopeless 0  PHQ - 2 Score 0  Altered sleeping 0  Tired, decreased energy 0  Change in appetite 0  Feeling bad or failure about yourself  0  Trouble concentrating 0  Moving slowly or fidgety/restless 0  Suicidal thoughts 0  PHQ-9 Score 0  Difficult doing work/chores Not difficult at all    Health Maintenance Due  Topic Date Due   Diabetic kidney evaluation - Urine ACR  Never done   HEMOGLOBIN A1C  05/16/2023   OPHTHALMOLOGY EXAM  07/04/2023   Influenza Vaccine  08/31/2023   Diabetic kidney evaluation - eGFR measurement  11/15/2023     ROS: Per HPI, otherwise a complete review of systems was negative.   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   TTE 04/26/2023: EF 60-65, no RWMA, GR 1 DD, normal RVSF, mild dilation of aortic root 39 mm, mild dilation of ascending aorta 41 mm    Asthma    All my life   Basal cell carcinoma (BCC) of right temple region    CAD (coronary artery disease) 2023   5v   Cancer (HCC)    basal cell of right temple; carcinoids   CHF (congestive heart failure) (HCC)    Complication of anesthesia    hard to wake up after bronchoscopy   Diabetes (HCC)    type 2    Dyspnea    GERD (gastroesophageal reflux disease)    Gout    resolved, no current problem   Headache    History of hiatal hernia 05/16/2006   small noted on EGD   Hypertension    hx   Lung mass    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, in remission-chest, but all over   Memory loss    mild - r/t chemo, 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   Neuromuscular disorder (HCC)    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   Sleep apnea    does not use anything for OSA   Patient Active Problem List   Diagnosis Date Noted   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   Allergic rhinitis 05/15/2022   Memory impairment 01/06/2022   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   Right shoulder pain 08/29/2021   CAD (coronary artery disease) 06/10/2021   Onychomycosis 06/09/2021   Microscopic hematuria 04/04/2021   Dyslipidemia associated with type 2 diabetes mellitus (HCC) 11/08/2020   Hallux valgus 09/07/2020   Insomnia 12/16/2019  Spondylolisthesis of lumbar region 01/17/2018   Hypertension associated with diabetes (HCC) 11/29/2017   Asthma 11/29/2017   BPH (benign prostatic hyperplasia) 11/29/2017   Chronic low back pain 11/29/2017   Osteoarthritis 11/29/2017   Psoriasis 11/29/2017   GERD (gastroesophageal reflux disease) 11/29/2017   Neuropathic pain 04/14/2017   Constipation 03/04/2017   Lymphoma, 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   NHL (non-Hodgkin's lymphoma) (HCC) 02/07/2017   OSA (obstructive sleep apnea) 07/22/2013   T2DM (type 2 diabetes mellitus) (HCC) 12/07/2010   Past Surgical History:  Procedure Laterality Date   BACK SURGERY     L4-L5 bracket and screws   BICEPS TENDON REPAIR Left    CARPAL TUNNEL RELEASE     COLONOSCOPY     CORONARY BALLOON ANGIOPLASTY N/A 06/10/2021   Procedure: CORONARY BALLOON ANGIOPLASTY;  Surgeon: Dann Candyce RAMAN, MD;  Location: [facility] INVASIVE CV LAB;  Service: Cardiovascular;  Laterality: N/A;   CORONARY STENT INTERVENTION N/A 06/10/2021   Procedure:  CORONARY STENT INTERVENTION;  Surgeon: Dann Candyce RAMAN, MD;  Location[facility] INVASIVE CV LAB;  Service: Cardiovascular;  Laterality: N/A;   CORONARY ULTRASOUND/IVUS N/A 06/10/2021   Procedure: Intravascular Ultrasound/IVUS;  Surgeon: Dann Candyce RAMAN, MD;  Location: [facility] INVASIVE CV LAB;  Service: Cardiovascular;  Laterality: N/A;   FINGER SURGERY     KNEE ARTHROSCOPY     ACL tear   LEFT HEART CATH AND CORONARY ANGIOGRAPHY N/A 06/10/2021   Procedure: LEFT HEART CATH AND CORONARY ANGIOGRAPHY;  Surgeon: Dann Candyce RAMAN, MD;  Location: [facility] INVASIVE CV LAB;  Service: Cardiovascular;  Laterality: N/A;   LEFT HEART CATHETERIZATION WITH CORONARY ANGIOGRAM N/A 12/07/2010   Procedure: LEFT HEART CATHETERIZATION WITH CORONARY ANGIOGRAM;  Surgeon: Alm LELON Clay, MD;  Location: [facility] CATH LAB;  Service: Cardiovascular;  Laterality: N/A;   LUMBAR LAMINECTOMY/DECOMPRESSION MICRODISCECTOMY Right 12/09/2020   Procedure: MICRODISCECTOMY, RIGHT LUMBAR TWO-THREE, LUMBAR THREE-FOUR;  Surgeon: Mavis Purchase, MD;  Location: [facility] OR;  Service: Neurosurgery;  Laterality: Right;   PILONIDAL CYST EXCISION     PORT-A-CATH REMOVAL N/A 11/09/2017   Procedure: REMOVAL PORT-A-CATH;  Surgeon: Gladis Cough, MD;  Location: [facility];  Service: General;  Laterality: N/A;  local   PORTACATH PLACEMENT Left 02/27/2017   Procedure: INSERTION PORT-A-CATH;  Surgeon: Gladis Cough, MD;  Location: [facility] ORS;  Service: General;  Laterality: Left;   SPINE SURGERY     UPPER GASTROINTESTINAL ENDOSCOPY  05/16/2006   VIDEO BRONCHOSCOPY WITH ENDOBRONCHIAL ULTRASOUND N/A 01/24/2017   Procedure: VIDEO BRONCHOSCOPY WITH ENDOBRONCHIAL ULTRASOUND with TRANSBRONCHIAL BIOPSY;  Surgeon: Army Dallas NOVAK, MD;  Location: [facility] OR;  Service: Thoracic;  Laterality: N/A;   VIDEO MEDIASTINOSCOPY N/A 02/05/2017   Procedure: VIDEO MEDIASTINOSCOPY;  Surgeon: Army Dallas NOVAK, MD;  Location: [facility] OR;  Service: Thoracic;  Laterality:  N/A;    Family History  Problem Relation Age of Onset   Emphysema Mother    Asthma Mother    COPD Mother    Heart disease Father    Hypertension Father    Stroke Father     Medications- reviewed and updated Current Outpatient Medications  Medication Sig Dispense Refill   ALBUTEROL  SULFATE HFA IN Inhale into the lungs. AS DIRECTED PRN     atorvastatin  (LIPITOR) 40 MG tablet Take 1 tablet (40 mg total) by mouth daily. 90 tablet 2   augmented betamethasone  dipropionate (DIPROLENE -AF) 0.05 % cream Apply 1 Application topically to affected areas on skin as directed. 50 g PRN   azelastine  (ASTELIN ) 0.1 % nasal spray Place  2 sprays into both nostrils 2 (two) times daily. 30 mL 12   celecoxib  (CELEBREX ) 200 MG capsule Take 1 capsule (200 mg total) by mouth 2 (two) times daily as needed. 60 capsule 5   ciclopirox  (PENLAC ) 8 % solution Apply topically at bedtime. Apply over nail and surrounding skin. Apply daily over previous coat. After seven (7) days, may remove with alcohol and continue cycle. 6.6 mL 2   clopidogrel  (PLAVIX ) 75 MG tablet Take 1 tablet (75 mg total) by mouth daily with breakfast. 90 tablet 3   cyclobenzaprine  (FLEXERIL ) 10 MG tablet Take 1 tablet (10 mg total) by mouth 3 (three) times daily as needed for muscle spasms. 50 tablet 1   Fluticasone -Umeclidin-Vilant (TRELEGY ELLIPTA ) 100-62.5-25 MCG/ACT AEPB Inhale 1 puff then rinse mouth, once daily 60 each 12   gabapentin  (NEURONTIN ) 800 MG tablet Take 1.5 tablets (1,200 mg total) by mouth 3 (three) times daily. 90 tablet 1   glimepiride  (AMARYL ) 4 MG tablet Take 0.5 tablets (2 mg total) by mouth daily before largest meal of the day. 60 tablet 1   HYDROcodone -acetaminophen  (NORCO/VICODIN) 5-325 MG tablet Take 1-2 tablets by mouth every 6 (six) hours as needed. 15 tablet 0   HYDROCORTISONE  ACE, RECTAL, (PROCTOCORT ) 30 MG SUPP Place 1 suppository (30 mg total) rectally 2 (two) times daily as needed. 12 suppository 3    ipratropium-albuterol  (DUONEB) 0.5-2.5 (3) MG/3ML SOLN Take 3 mLs by nebulization every 6 (six) hours as needed. 360 mL 0   isosorbide  mononitrate (IMDUR ) 30 MG 24 hr tablet Take 1 tablet (30 mg total) by mouth daily. 90 tablet 3   levalbuterol  (XOPENEX  HFA) 45 MCG/ACT inhaler Inhale 2 puffs into the lungs daily as needed for wheezing or shortness of breath. 15 g 1   metFORMIN  (GLUCOPHAGE ) 1000 MG tablet Take 1 tablet (1,000 mg total) by mouth 2 (two) times daily. 180 tablet 0   metoprolol  tartrate (LOPRESSOR ) 25 MG tablet Take 1 tablet (25 mg total) by mouth 2 (two) times daily. 180 tablet 3   Multiple Vitamin (MULTIVITAMIN) capsule Take 1 capsule by mouth daily.     OVER THE COUNTER MEDICATION Take 1 tablet by mouth daily. [employer] Fuel     oxyCODONE -acetaminophen  (PERCOCET/ROXICET) 5-325 MG tablet Take 1 - 2 tablets by mouth every 6 hours as needed for severe pain. 20 tablet 0   pantoprazole  (PROTONIX ) 40 MG tablet Take 1 tablet (40 mg total) by mouth daily. 90 tablet 1   tamsulosin  (FLOMAX ) 0.4 MG CAPS capsule Take 1 capsule (0.4 mg) by mouth daily. 90 capsule 1   traMADol  (ULTRAM ) 50 MG tablet Take 1 tablet (50 mg total) by mouth every 8 (eight) hours as needed. 50 tablet 1   traZODone  (DESYREL ) 50 MG tablet Take 0.5-1 tablets (25-50 mg total) by mouth at bedtime as needed for sleep. 30 tablet 3   No current facility-administered medications for this visit.    Allergies-reviewed and updated No Known Allergies  Social History   Socioeconomic History   Marital status: Married    Spouse name: Not on file   Number of children: 2   Years of education: Not on file   Highest education level: Some college, no degree  Occupational History   Occupation: retired  Tobacco Use   Smoking status: Former    Current packs/day: 0.00    Average packs/day: 3.0 packs/day for 20.0 years (60.0 ttl pk-yrs)    Types: Cigarettes    Start date: 01/31/1968    Quit  date: 01/31/1988    Years since  quitting: 35.8   Smokeless tobacco: Never  Vaping Use   Vaping status: Never Used  Substance and Sexual Activity   Alcohol use: No   Drug use: No   Sexual activity: Yes    Partners: Female    Birth control/protection: None  Other Topics Concern   Not on file  Social History Narrative   1 grand and 1 due 08/2021   2 children   Drinks monsters daily   Lives with wife   One floor home   Left handed   Social Drivers of Health   Financial Resource Strain: Low Risk  (04/18/2023)   Overall Financial Resource Strain (CARDIA)    Difficulty of Paying Living Expenses: Not hard at all  Food Insecurity: No Food Insecurity (04/18/2023)   Hunger Vital Sign    Worried About Running Out of Food in the Last Year: Never true    Ran Out of Food in the Last Year: Never true  Transportation Needs: No Transportation Needs (04/18/2023)   PRAPARE - Administrator, Civil Service (Medical): No    Lack of Transportation (Non-Medical): No  Physical Activity: Inactive (04/18/2023)   Exercise Vital Sign    Days of Exercise per Week: 0 days    Minutes of Exercise per Session: 0 min  Stress: No Stress Concern Present (04/18/2023)   [employer] of Occupational Health - Occupational Stress Questionnaire    Feeling of Stress : Not at all  Social Connections: Moderately Isolated (04/18/2023)   Social Connection and Isolation Panel    Frequency of Communication with Friends and Family: More than three times a week    Frequency of Social Gatherings with Friends and Family: More than three times a week    Attends Religious Services: Never    Database administrator or Organizations: No    Attends Engineer, structural: Never    Marital Status: Married        Objective:  Physical Exam: BP 132/82   Pulse 66   Temp (!) 97 F (36.1 C) (Temporal)   Ht 6' 3 (1.905 m)   Wt 238 lb 9.6 oz (108.2 kg)   SpO2 96%   BMI 29.82 kg/m   Body mass index is 29.82 kg/m. Wt Readings from Last 3  Encounters:  11/19/23 238 lb 9.6 oz (108.2 kg)  08/30/23 236 lb 9.6 oz (107.3 kg)  06/14/23 250 lb 2 oz (113.5 kg)   Gen: NAD, resting comfortably HEENT: TMs normal bilaterally. OP clear. No thyromegaly noted.  CV: RRR with no murmurs appreciated Pulm: NWOB, CTAB with no crackles, wheezes, or rhonchi GI: Normal bowel sounds present. Soft, Nontender, Nondistended. MSK: no edema, cyanosis, or clubbing noted - Back: No deformities.  Tender to palpation along right inferior buttocks. - Legs: Straight leg raise negative on the right.  Limited internal rotation on the right hip. - Hands: Bony hypertrophy noted in all joints.  Pain elicited with axial loading of bilateral CMC joint. Skin: warm, dry Neuro: CN2-12 grossly intact. Strength 5/5 in upper and lower extremities. Reflexes symmetric and intact bilaterally.  Psych: Normal affect and thought content     Tattianna Schnarr M. Kennyth, MD 11/19/2023 8:20 AM

## 2023-11-19 NOTE — Assessment & Plan Note (Signed)
 On Flomax  0.4 mg twice daily.  Check PSA.

## 2023-11-20 ENCOUNTER — Other Ambulatory Visit (HOSPITAL_COMMUNITY): Payer: Self-pay

## 2023-11-20 ENCOUNTER — Ambulatory Visit: Payer: Self-pay | Admitting: Family Medicine

## 2023-11-20 ENCOUNTER — Other Ambulatory Visit: Payer: Self-pay

## 2023-11-20 NOTE — Progress Notes (Signed)
 His B12 is above range.  It is okay for him to hold or decrease his B12 supplementation and we should recheck again in 3 to 6 months.  The rest of his labs are all at goal and we can recheck everything in 6 to 12 months.

## 2023-11-21 ENCOUNTER — Other Ambulatory Visit (HOSPITAL_COMMUNITY): Payer: Self-pay

## 2023-11-21 NOTE — Progress Notes (Signed)
 X-ray is negative.  No signs of fracture or other acute abnormality.  He should let us  know if pain is not improving in the next few weeks and we can refer to PT or sports medicine.

## 2023-11-21 NOTE — Progress Notes (Unsigned)
 LILLETTE Ileana Collet, PhD, LAT, ATC acting as a scribe for Artist Lloyd, MD.  Jeffery Wood is a 68 y.o. male who presents to [facility] at [facility] today for bilat thumb pain x ***. Pt locates pain to ***  Grip strength: Aggravates: Treatments tried: Celebrex   Pertinent review of systems: ***  Relevant historical information: ***   Exam:  There were no vitals taken for this visit. General: Well Developed, well nourished, and in no acute distress.   MSK: ***    Lab and Radiology Results Results for orders placed or performed in visit on 11/19/23 (from the past 72 hours)  Urine Microalbumin w/creat. ratio     Status: None   Collection Time: 11/19/23  8:50 AM  Result Value Ref Range   Microalb, Ur 0.9 0.0 - 1.9 mg/dL   Creatinine,U 09.2 mg/dL   Microalb Creat Ratio 10.1 0.0 - 30.0 mg/g  CBC     Status: None   Collection Time: 11/19/23  8:50 AM  Result Value Ref Range   WBC 6.7 4.0 - 10.5 K/uL   RBC 4.64 4.22 - 5.81 Mil/uL   Platelets 243.0 150.0 - 400.0 K/uL   Hemoglobin 14.4 13.0 - 17.0 g/dL   HCT 57.4 60.9 - 47.9 %   MCV 91.6 78.0 - 100.0 fl   MCHC 33.8 30.0 - 36.0 g/dL   RDW 85.7 88.4 - 84.4 %  Comprehensive metabolic panel with GFR     Status: None   Collection Time: 11/19/23  8:50 AM  Result Value Ref Range   Sodium 140 135 - 145 mEq/L   Potassium 4.4 3.5 - 5.1 mEq/L   Chloride 102 96 - 112 mEq/L   CO2 28 19 - 32 mEq/L   Glucose, Bld 71 70 - 99 mg/dL   BUN 13 6 - 23 mg/dL   Creatinine, Ser 9.31 0.40 - 1.50 mg/dL   Total Bilirubin 0.9 0.2 - 1.2 mg/dL   Alkaline Phosphatase 88 39 - 117 U/L   AST 15 0 - 37 U/L   ALT 11 0 - 53 U/L   Total Protein 6.1 6.0 - 8.3 g/dL   Albumin  4.1 3.5 - 5.2 g/dL   GFR 04.01 >39.99 mL/min    Comment: Calculated using the CKD-EPI Creatinine Equation (2021)   Calcium  9.0 8.4 - 10.5 mg/dL  Hemoglobin J8r     Status: None   Collection Time: 11/19/23  8:50 AM  Result Value Ref Range   Hgb A1c MFr Bld 5.8 4.6  - 6.5 %    Comment: Glycemic Control Guidelines for People with Diabetes:Non Diabetic:  <6%Goal of Therapy: <7%Additional Action Suggested:  >8%   TSH     Status: None   Collection Time: 11/19/23  8:50 AM  Result Value Ref Range   TSH 1.09 0.35 - 5.50 uIU/mL  PSA     Status: None   Collection Time: 11/19/23  8:50 AM  Result Value Ref Range   PSA 1.21 0.10 - 4.00 ng/mL    Comment: Test performed using Access Hybritech PSA Assay, a parmagnetic partical, chemiluminecent immunoassay.  Lipid panel     Status: None   Collection Time: 11/19/23  8:50 AM  Result Value Ref Range   Cholesterol 107 0 - 200 mg/dL    Comment: ATP III Classification       Desirable:  < 200 mg/dL               Borderline High:  200 -  239 mg/dL          High:  > = 759 mg/dL   Triglycerides 46.9 0.0 - 149.0 mg/dL    Comment: Normal:  <849 mg/dLBorderline High:  150 - 199 mg/dL   HDL 60.19 >60.99 mg/dL   VLDL 89.3 0.0 - 59.9 mg/dL   LDL Cholesterol 57 0 - 99 mg/dL   Total CHOL/HDL Ratio 3     Comment:                Men          Women1/2 Average Risk     3.4          3.3Average Risk          5.0          4.42X Average Risk          9.6          7.13X Average Risk          15.0          11.0                       NonHDL 67.60     Comment: NOTE:  Non-HDL goal should be 30 mg/dL higher than patient's LDL goal (i.e. LDL goal of < 70 mg/dL, would have non-HDL goal of < 100 mg/dL)  Vitamin B12     Status: Abnormal   Collection Time: 11/19/23  8:50 AM  Result Value Ref Range   Vitamin B-12 >1500 (H) 211 - 911 pg/mL  VITAMIN D  25 Hydroxy (Vit-D Deficiency, Fractures)     Status: None   Collection Time: 11/19/23  8:50 AM  Result Value Ref Range   VITD 38.50 30.00 - 100.00 ng/mL   *Note: Due to a large number of results and/or encounters for the requested time period, some results have not been displayed. A complete set of results can be found in Results Review.   DG Hip Unilat W OR W/O Pelvis 2-3 Views Right Result Date:  11/21/2023 EXAM: 2 OR MORE VIEW(S) XRAY OF THE RIGHT HIP 11/19/2023 08:33:25 AM COMPARISON: None available. CLINICAL HISTORY: Right buttock s/p fall 2 months ago. FINDINGS: BONES AND JOINTS: No acute fracture or focal osseous lesion. The hip joint is maintained. Mild degenerative changes of hip joints. LUMBAR SPINE: Postsurgical changes in lower lumbar spine. SOFT TISSUES: The soft tissues are unremarkable. IMPRESSION: 1. No acute findings. Electronically signed by: Norman Gatlin MD 11/21/2023 01:16 AM EDT RP Workstation: [id number]       Assessment and Plan: 68 y.o. male with ***   PDMP not reviewed this encounter. No orders of the defined types were placed in this encounter.  No orders of the defined types were placed in this encounter.    Discussed warning signs or symptoms. Please see discharge instructions. Patient expresses understanding.   ***

## 2023-11-22 ENCOUNTER — Ambulatory Visit

## 2023-11-22 ENCOUNTER — Other Ambulatory Visit: Payer: Self-pay

## 2023-11-22 ENCOUNTER — Ambulatory Visit: Admitting: Family Medicine

## 2023-11-22 VITALS — BP 136/74 | Ht 75.0 in | Wt 243.0 lb

## 2023-11-22 DIAGNOSIS — M19041 Primary osteoarthritis, right hand: Secondary | ICD-10-CM

## 2023-11-22 DIAGNOSIS — M19042 Primary osteoarthritis, left hand: Secondary | ICD-10-CM | POA: Diagnosis not present

## 2023-11-22 DIAGNOSIS — M79644 Pain in right finger(s): Secondary | ICD-10-CM | POA: Diagnosis not present

## 2023-11-22 DIAGNOSIS — M79642 Pain in left hand: Secondary | ICD-10-CM | POA: Diagnosis not present

## 2023-11-22 DIAGNOSIS — M79641 Pain in right hand: Secondary | ICD-10-CM | POA: Diagnosis not present

## 2023-11-22 DIAGNOSIS — G8929 Other chronic pain: Secondary | ICD-10-CM

## 2023-11-22 DIAGNOSIS — M79645 Pain in left finger(s): Secondary | ICD-10-CM

## 2023-11-22 DIAGNOSIS — M180 Bilateral primary osteoarthritis of first carpometacarpal joints: Secondary | ICD-10-CM | POA: Diagnosis not present

## 2023-11-22 NOTE — Patient Instructions (Addendum)
 Thank you for coming in today.   Please get an Xray today before you leave   You received an injection today. Seek immediate medical attention if the joint becomes red, extremely painful, or is oozing fluid.   Heat may be helpful  Check back as needed

## 2023-11-26 ENCOUNTER — Ambulatory Visit: Payer: Self-pay | Admitting: Family Medicine

## 2023-11-26 NOTE — Progress Notes (Signed)
 Left hand x-ray shows arthritis throughout the hand.

## 2023-11-26 NOTE — Progress Notes (Signed)
 Right hand x-ray shows severe arthritis.

## 2023-12-10 ENCOUNTER — Other Ambulatory Visit: Payer: Self-pay

## 2023-12-10 ENCOUNTER — Other Ambulatory Visit (HOSPITAL_COMMUNITY): Payer: Self-pay

## 2023-12-10 ENCOUNTER — Other Ambulatory Visit: Payer: Self-pay | Admitting: Family Medicine

## 2023-12-10 MED ORDER — METFORMIN HCL 1000 MG PO TABS
1000.0000 mg | ORAL_TABLET | Freq: Two times a day (BID) | ORAL | 0 refills | Status: DC
  Filled 2023-12-10: qty 180, 90d supply, fill #0

## 2023-12-10 MED ORDER — PANTOPRAZOLE SODIUM 40 MG PO TBEC
40.0000 mg | DELAYED_RELEASE_TABLET | Freq: Every day | ORAL | 1 refills | Status: AC
  Filled 2023-12-10: qty 90, 90d supply, fill #0
  Filled 2024-03-05: qty 90, 90d supply, fill #1

## 2023-12-14 ENCOUNTER — Other Ambulatory Visit (HOSPITAL_BASED_OUTPATIENT_CLINIC_OR_DEPARTMENT_OTHER): Payer: Self-pay

## 2023-12-18 ENCOUNTER — Other Ambulatory Visit (HOSPITAL_COMMUNITY): Payer: Self-pay

## 2023-12-18 ENCOUNTER — Other Ambulatory Visit: Payer: Self-pay

## 2023-12-24 ENCOUNTER — Other Ambulatory Visit (HOSPITAL_COMMUNITY): Payer: Self-pay

## 2023-12-28 ENCOUNTER — Other Ambulatory Visit (HOSPITAL_COMMUNITY): Payer: Self-pay

## 2023-12-28 ENCOUNTER — Other Ambulatory Visit: Payer: Self-pay | Admitting: Family Medicine

## 2023-12-31 MED ORDER — CELECOXIB 200 MG PO CAPS
200.0000 mg | ORAL_CAPSULE | Freq: Two times a day (BID) | ORAL | 5 refills | Status: AC | PRN
  Filled 2023-12-31: qty 60, 30d supply, fill #0
  Filled 2024-02-06: qty 60, 30d supply, fill #1
  Filled 2024-03-05: qty 60, 30d supply, fill #2
  Filled 2024-03-07: qty 60, 30d supply, fill #3

## 2024-01-01 ENCOUNTER — Other Ambulatory Visit (HOSPITAL_COMMUNITY): Payer: Self-pay

## 2024-01-01 ENCOUNTER — Other Ambulatory Visit: Payer: Self-pay

## 2024-01-04 ENCOUNTER — Encounter: Payer: Self-pay | Admitting: Podiatry

## 2024-01-04 ENCOUNTER — Ambulatory Visit: Admitting: Podiatry

## 2024-01-04 DIAGNOSIS — M792 Neuralgia and neuritis, unspecified: Secondary | ICD-10-CM

## 2024-01-04 DIAGNOSIS — M2041 Other hammer toe(s) (acquired), right foot: Secondary | ICD-10-CM

## 2024-01-04 NOTE — Progress Notes (Signed)
 Subjective: Chief Complaint  Patient presents with   Routine Post Op    S/p foot surgery right great toe. 4 pain. NIDDM A1C 5.8.    This is a 68 year old male presents the office the above concerns.  He states he has not been issues with the big toe except he cannot bend the toe.  He states he actually took a pair of pliers to his toe to try to help bend the toe.  He started having more pressure on the 2nd and 3rd toes and he gets pain to those toes.  No pain in the big toe.  He has ongoing neuropathy.  He is on high-dose gabapentin .  He states that sometimes he misses the middle of the day dose and he gets quite a bit of pain from the neuropathy and does not take it.  Objective: AAO x3, NAD DP/PT pulses palpable bilaterally, CRT less than 3 seconds Sensation decreased with Semmes Weinstein monofilament. Hallux is rectus.  Flexible hammertoe contractures present the second, third to lesser degree the 4th and 5th toes on the right foot.  Unable to appreciate any area of pinpoint tenderness.  No edema, erythema to the lesser digits. No pain with calf compression, swelling, warmth, erythema  Assessment: 68 year old male with neuropathy, hammertoe deformity  Plan: -All treatment options discussed with the patient including all alternatives, risks, complications.  -We do long discussion regards to the hammertoes, which are flexible.  We discussed with conservative as well as surgical intervention.  Not interested in further surgical intervention for the lesser toes.  Will continue conservative management.  Dispensed toe crest which are helpful.  Discussed exercises for his toes to help keep them flexible.  Continue supportive shoe gear, offloading -We did discussion regards to his symptomatic neuropathy.  Offered referral to pain management at [facility] for alternative options.  He can consider this but for now we will hold off on referral.  Discussed daily foot inspection and notices any cuts, scrapes,  changes to his feet on his skin immediately.  Continue gabapentin  dose prescribed by his primary care doctor. -Patient encouraged to call the office with any questions, concerns, change in symptoms.   Donnice JONELLE Fees DPM

## 2024-01-07 ENCOUNTER — Telehealth: Admitting: Family Medicine

## 2024-01-07 ENCOUNTER — Telehealth: Admitting: Physician Assistant

## 2024-01-07 ENCOUNTER — Other Ambulatory Visit (HOSPITAL_COMMUNITY): Payer: Self-pay

## 2024-01-07 DIAGNOSIS — J441 Chronic obstructive pulmonary disease with (acute) exacerbation: Secondary | ICD-10-CM

## 2024-01-07 DIAGNOSIS — B9689 Other specified bacterial agents as the cause of diseases classified elsewhere: Secondary | ICD-10-CM

## 2024-01-07 MED ORDER — DOXYCYCLINE HYCLATE 100 MG PO TABS
100.0000 mg | ORAL_TABLET | Freq: Two times a day (BID) | ORAL | 0 refills | Status: AC
  Filled 2024-01-07: qty 14, 7d supply, fill #0

## 2024-01-07 MED ORDER — BENZONATATE 200 MG PO CAPS
200.0000 mg | ORAL_CAPSULE | Freq: Three times a day (TID) | ORAL | 0 refills | Status: DC | PRN
  Filled 2024-01-07: qty 30, 10d supply, fill #0

## 2024-01-07 NOTE — Progress Notes (Signed)
 Message sent to patient requesting further input regarding current symptoms. Awaiting patient response.

## 2024-01-07 NOTE — Progress Notes (Signed)
   Thank you for the details you included in the comment boxes. Those details are very helpful in determining the best course of treatment for you and help us  to provide the best care.Because of concern for potential COPD exacerbation and need to assess some things further, we recommend that you schedule a Virtual Urgent Care video visit in order for the provider to better assess what is going on.  The provider will be able to give you a more accurate diagnosis and treatment plan if we can more freely discuss your symptoms and with the addition of a virtual examination.   If you change your visit to a video visit, we will bill your insurance (similar to an office visit) and you will not be charged for this e-Visit. You will be able to stay at home and speak with the first available [facility] advanced practice provider. The link to do a video visit is in the drop down Menu tab of your Welcome screen in MyChart.

## 2024-01-07 NOTE — Progress Notes (Signed)
 Virtual Visit Consent   Jeffery Wood, you are scheduled for a virtual visit with a [facility] provider today. Just as with appointments in the office, your consent must be obtained to participate. Your consent will be active for this visit and any virtual visit you may have with one of our providers in the next 365 days. If you have a MyChart account, a copy of this consent can be sent to you electronically.  As this is a virtual visit, video technology does not allow for your provider to perform a traditional examination. This may limit your provider's ability to fully assess your condition. If your provider identifies any concerns that need to be evaluated in person or the need to arrange testing (such as labs, EKG, etc.), we will make arrangements to do so. Although advances in technology are sophisticated, we cannot ensure that it will always work on either your end or our end. If the connection with a video visit is poor, the visit may have to be switched to a telephone visit. With either a video or telephone visit, we are not always able to ensure that we have a secure connection.  By engaging in this virtual visit, you consent to the provision of healthcare and authorize for your insurance to be billed (if applicable) for the services provided during this visit. Depending on your insurance coverage, you may receive a charge related to this service.  I need to obtain your verbal consent now. Are you willing to proceed with your visit today? Jeffery Wood has provided verbal consent on 01/07/2024 for a virtual visit (video or telephone). Jeffery CHRISTELLA Barefoot, NP  Date: 01/07/2024 10:29 AM   Virtual Visit via Video Note   I, Jeffery Wood, connected with  Jeffery Wood  ([id number], [birth year]) on 01/07/24 at 10:30 AM EST by a video-enabled telemedicine application and verified that I am speaking with the correct person using two identifiers.  Location: Patient: Virtual Visit Location Patient:  Home Provider: Virtual Visit Location Provider: Home Office   I discussed the limitations of evaluation and management by telemedicine and the availability of in person appointments. The patient expressed understanding and agreed to proceed.    History of Present Illness: Jeffery Wood is a 68 y.o. 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   Right shoulder pain 08/29/2021   CAD (coronary artery disease) 06/10/2021   Onychomycosis 06/09/2021   Microscopic hematuria 04/04/2021   Dyslipidemia associated with type 2 diabetes mellitus (HCC) 11/08/2020   Hallux valgus 09/07/2020   Insomnia 12/16/2019   Spondylolisthesis of lumbar region 01/17/2018   Hypertension associated with diabetes (HCC) 11/29/2017   Asthma 11/29/2017   BPH (benign prostatic hyperplasia) 11/29/2017   Chronic low back pain 11/29/2017   Osteoarthritis 11/29/2017   Psoriasis 11/29/2017   GERD (gastroesophageal reflux disease) 11/29/2017   Neuropathic pain 04/14/2017   Constipation 03/04/2017  Lymphoma, 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 AS DIRECTED PRN, Disp: , Rfl:    atorvastatin   (LIPITOR) 40 MG tablet, Take 1 tablet (40 mg total) by mouth daily., Disp: 90 tablet, Rfl: 2   augmented betamethasone  dipropionate (DIPROLENE -AF) 0.05 % cream, Apply 1 Application topically to affected areas on skin as directed., Disp: 50 g, Rfl: PRN   azelastine  (ASTELIN ) 0.1 % nasal spray, Place 2 sprays into both nostrils 2 (two) times daily., Disp: 30 mL, Rfl: 12   celecoxib  (CELEBREX ) 200 MG capsule, Take 1 capsule (200 mg total) by mouth 2 (two) times daily as needed., Disp: 60 capsule, Rfl: 5   ciclopirox  (PENLAC ) 8 % solution, Apply topically at bedtime. Apply over nail and surrounding skin. Apply daily over previous coat. After seven (7) days, may remove with alcohol and continue cycle., Disp: 6.6 mL, Rfl: 2   clopidogrel  (PLAVIX ) 75 MG tablet, Take 1 tablet (75 mg total) by mouth daily with breakfast., Disp: 90 tablet, Rfl: 3   cyclobenzaprine  (FLEXERIL ) 10 MG tablet, Take 1 tablet (10 mg total) by mouth 3 (three) times daily as needed for muscle spasms., Disp: 50 tablet, Rfl: 1   Fluticasone -Umeclidin-Vilant (TRELEGY ELLIPTA ) 100-62.5-25 MCG/ACT AEPB, Inhale 1 puff then rinse mouth, once daily, Disp: 60 each, Rfl: 12   gabapentin  (NEURONTIN ) 800 MG tablet, Take 1.5 tablets (1,200 mg total) by mouth 3 (three) times daily., Disp: 90 tablet, Rfl: 1   glimepiride  (AMARYL ) 4 MG tablet, Take 0.5 tablets (2 mg total) by mouth daily before largest meal of the day., Disp: 60 tablet, Rfl: 1   HYDROcodone -acetaminophen  (NORCO/VICODIN) 5-325 MG tablet, Take 1-2 tablets by mouth every 6 (six) hours as needed. (Patient not taking: Reported on 01/04/2024), Disp: 15 tablet, Rfl: 0   HYDROCORTISONE  ACE, RECTAL, (PROCTOCORT ) 30 MG SUPP, Place 1 suppository (30 mg total) rectally 2 (two) times daily as needed., Disp: 12 suppository, Rfl: 3   ipratropium-albuterol  (DUONEB) 0.5-2.5 (3) MG/3ML SOLN, Take 3 mLs by nebulization every 6 (six) hours as needed., Disp: 360 mL, Rfl: 0   isosorbide  mononitrate (IMDUR ) 30  MG 24 hr tablet, Take 1 tablet (30 mg total) by mouth daily., Disp: 90 tablet, Rfl: 3   levalbuterol  (XOPENEX  HFA) 45 MCG/ACT inhaler, Inhale 2 puffs into the lungs daily as needed for wheezing or shortness of breath., Disp: 15 g, Rfl: 1   metFORMIN  (GLUCOPHAGE ) 1000 MG tablet, Take 1 tablet (1,000 mg total) by mouth 2 (two) times daily., Disp: 180 tablet, Rfl: 0   metoprolol  tartrate (LOPRESSOR ) 25 MG tablet, Take 1 tablet (25 mg total) by mouth 2 (two) times daily., Disp: 180 tablet, Rfl: 3   Multiple Vitamin (MULTIVITAMIN) capsule, Take 1 capsule by mouth daily., Disp: , Rfl:    oxyCODONE -acetaminophen  (PERCOCET/ROXICET) 5-325 MG tablet, Take 1 - 2 tablets by mouth every 6 hours as needed for severe pain. (Patient not taking: Reported on 01/04/2024), Disp: 20 tablet, Rfl: 0   pantoprazole  (PROTONIX ) 40 MG tablet, Take 1 tablet (40 mg total) by mouth daily., Disp: 90 tablet, Rfl: 1   tamsulosin  (FLOMAX ) 0.4 MG CAPS capsule, Take 1 capsule (0.4 mg) by mouth daily., Disp: 90 capsule, Rfl: 1   traMADol  (ULTRAM ) 50 MG tablet, Take 1 tablet (50  mg total) by mouth every 8 (eight) hours as needed., Disp: 50 tablet, Rfl: 1   traZODone  (DESYREL ) 50 MG tablet, Take 0.5-1 tablets (25-50 mg total) by mouth at bedtime as needed for sleep., Disp: 30 tablet, Rfl: 3  Observations/Objective: Patient is well-developed, well-nourished in no acute distress.  Resting comfortably  at home.  Head is normocephalic, atraumatic.  No labored breathing.  Speech is clear and coherent with logical content.  Patient is alert and oriented at baseline.    Assessment and Plan:  1. Acute bacterial bronchitis (Primary)  - doxycycline  (VIBRA -TABS) 100 MG tablet; Take 1 tablet (100 mg total) by mouth 2 (two) times daily for 7 days.  Dispense: 14 tablet; Refill: 0 - benzonatate  (TESSALON ) 200 MG capsule; Take 1 capsule (200 mg total) by mouth 3 (three) times daily as needed for cough.  Dispense: 30 capsule; Refill: 0   - Take  meds as prescribed - Rest voice - Use a cool mist humidifier especially during the winter months when heat dries out the air. - Use saline nose sprays frequently to help soothe nasal passages if they are drying out. - Stay hydrated by drinking plenty of fluids - Keep thermostat turn down low to prevent drying out which can cause a dry cough. - For any cough or congestion- robitussin DM or Delsym as needed - For fever or aches or pains- take tylenol  or ibuprofen as directed on bottle             * for fevers greater than 101 orally you may alternate ibuprofen and tylenol  every 3 hours.  If you do not improve you will need a follow up visit in person.                 Reviewed side effects, risks and benefits of medication.    Patient acknowledged agreement and understanding of the plan.   Past Medical, Surgical, Social History, Allergies, and Medications have been Reviewed.       Follow Up Instructions: I discussed the assessment and treatment plan with the patient. The patient was provided an opportunity to ask questions and all were answered. The patient agreed with the plan and demonstrated an understanding of the instructions.  A copy of instructions were sent to the patient via MyChart unless otherwise noted below.    The patient was advised to call back or seek an in-person evaluation if the symptoms worsen or if the condition fails to improve as anticipated.    Jeffery CHRISTELLA Barefoot, NP

## 2024-01-07 NOTE — Patient Instructions (Addendum)
 Helayne KANDICE Pizza, thank you for joining Chiquita CHRISTELLA Barefoot, NP for today's virtual visit.  While this provider is not your primary care provider (PCP), if your PCP is located in our provider database this encounter information will be shared with them immediately following your visit.   A [facility] MyChart account gives you access to today's visit and all your visits, tests, and labs performed at [facility]  click here if you don't have a [facility] MyChart account or go to [email or web address]  Consent: (Patient) Jeffery Wood provided verbal consent for this virtual visit at the beginning of the encounter.  Current Medications:  Current Outpatient Medications:    benzonatate  (TESSALON ) 200 MG capsule, Take 1 capsule (200 mg total) by mouth 3 (three) times daily as needed for cough., Disp: 30 capsule, Rfl: 0   doxycycline  (VIBRA -TABS) 100 MG tablet, Take 1 tablet (100 mg total) by mouth 2 (two) times daily for 7 days., Disp: 14 tablet, Rfl: 0   ALBUTEROL  SULFATE HFA IN, Inhale into the lungs. AS DIRECTED PRN, Disp: , Rfl:    atorvastatin  (LIPITOR) 40 MG tablet, Take 1 tablet (40 mg total) by mouth daily., Disp: 90 tablet, Rfl: 2   augmented betamethasone  dipropionate (DIPROLENE -AF) 0.05 % cream, Apply 1 Application topically to affected areas on skin as directed., Disp: 50 g, Rfl: PRN   azelastine  (ASTELIN ) 0.1 % nasal spray, Place 2 sprays into both nostrils 2 (two) times daily., Disp: 30 mL, Rfl: 12   celecoxib  (CELEBREX ) 200 MG capsule, Take 1 capsule (200 mg total) by mouth 2 (two) times daily as needed., Disp: 60 capsule, Rfl: 5   ciclopirox  (PENLAC ) 8 % solution, Apply topically at bedtime. Apply over nail and surrounding skin. Apply daily over previous coat. After seven (7) days, may remove with alcohol and continue cycle., Disp: 6.6 mL, Rfl: 2   clopidogrel  (PLAVIX ) 75 MG tablet, Take 1 tablet (75 mg total) by mouth daily with breakfast., Disp: 90 tablet, Rfl: 3    cyclobenzaprine  (FLEXERIL ) 10 MG tablet, Take 1 tablet (10 mg total) by mouth 3 (three) times daily as needed for muscle spasms., Disp: 50 tablet, Rfl: 1   Fluticasone -Umeclidin-Vilant (TRELEGY ELLIPTA ) 100-62.5-25 MCG/ACT AEPB, Inhale 1 puff then rinse mouth, once daily, Disp: 60 each, Rfl: 12   gabapentin  (NEURONTIN ) 800 MG tablet, Take 1.5 tablets (1,200 mg total) by mouth 3 (three) times daily., Disp: 90 tablet, Rfl: 1   glimepiride  (AMARYL ) 4 MG tablet, Take 0.5 tablets (2 mg total) by mouth daily before largest meal of the day., Disp: 60 tablet, Rfl: 1   HYDROcodone -acetaminophen  (NORCO/VICODIN) 5-325 MG tablet, Take 1-2 tablets by mouth every 6 (six) hours as needed. (Patient not taking: Reported on 01/04/2024), Disp: 15 tablet, Rfl: 0   HYDROCORTISONE  ACE, RECTAL, (PROCTOCORT ) 30 MG SUPP, Place 1 suppository (30 mg total) rectally 2 (two) times daily as needed., Disp: 12 suppository, Rfl: 3   ipratropium-albuterol  (DUONEB) 0.5-2.5 (3) MG/3ML SOLN, Take 3 mLs by nebulization every 6 (six) hours as needed., Disp: 360 mL, Rfl: 0   isosorbide  mononitrate (IMDUR ) 30 MG 24 hr tablet, Take 1 tablet (30 mg total) by mouth daily., Disp: 90 tablet, Rfl: 3   levalbuterol  (XOPENEX  HFA) 45 MCG/ACT inhaler, Inhale 2 puffs into the lungs daily as needed for wheezing or shortness of breath., Disp: 15 g, Rfl: 1   metFORMIN  (GLUCOPHAGE ) 1000 MG tablet, Take 1 tablet (1,000 mg total) by mouth 2 (two) times daily., Disp: 180 tablet, Rfl: 0  metoprolol  tartrate (LOPRESSOR ) 25 MG tablet, Take 1 tablet (25 mg total) by mouth 2 (two) times daily., Disp: 180 tablet, Rfl: 3   Multiple Vitamin (MULTIVITAMIN) capsule, Take 1 capsule by mouth daily., Disp: , Rfl:    oxyCODONE -acetaminophen  (PERCOCET/ROXICET) 5-325 MG tablet, Take 1 - 2 tablets by mouth every 6 hours as needed for severe pain. (Patient not taking: Reported on 01/04/2024), Disp: 20 tablet, Rfl: 0   pantoprazole  (PROTONIX ) 40 MG tablet, Take 1 tablet (40 mg  total) by mouth daily., Disp: 90 tablet, Rfl: 1   tamsulosin  (FLOMAX ) 0.4 MG CAPS capsule, Take 1 capsule (0.4 mg) by mouth daily., Disp: 90 capsule, Rfl: 1   traMADol  (ULTRAM ) 50 MG tablet, Take 1 tablet (50 mg total) by mouth every 8 (eight) hours as needed., Disp: 50 tablet, Rfl: 1   traZODone  (DESYREL ) 50 MG tablet, Take 0.5-1 tablets (25-50 mg total) by mouth at bedtime as needed for sleep., Disp: 30 tablet, Rfl: 3   Medications ordered in this encounter:  Meds ordered this encounter  Medications   doxycycline  (VIBRA -TABS) 100 MG tablet    Sig: Take 1 tablet (100 mg total) by mouth 2 (two) times daily for 7 days.    Dispense:  14 tablet    Refill:  0    Supervising Provider:   LAMPTEY, PHILIP O [[id number]]   benzonatate  (TESSALON ) 200 MG capsule    Sig: Take 1 capsule (200 mg total) by mouth 3 (three) times daily as needed for cough.    Dispense:  30 capsule    Refill:  0    Supervising Provider:   BLAISE ALEENE KIDD [[id number]]     *If you need refills on other medications prior to your next appointment, please contact your pharmacy*  Follow-Up: Call back or seek an in-person evaluation if the symptoms worsen or if the condition fails to improve as anticipated.  [facility] [phone]  Other Instructions  - Take meds as prescribed - Rest voice - Use a cool mist humidifier especially during the winter months when heat dries out the air. - Use saline nose sprays frequently to help soothe nasal passages if they are drying out. - Stay hydrated by drinking plenty of fluids - Keep thermostat turn down low to prevent drying out which can cause a dry cough. - For any cough or congestion- robitussin DM or Delsym as needed - For fever or aches or pains- take tylenol  or ibuprofen as directed on bottle             * for fevers greater than 101 orally you may alternate ibuprofen and tylenol  every 3 hours.  If you do not improve you will need a follow up visit in  person.                 If you have been instructed to have an in-person evaluation today at a local Urgent Care facility, please use the link below. It will take you to a list of all of our available [facility], including address, phone number and hours of operation. Please do not delay care.  [facility]  If you or a family member do not have a primary care provider, use the link below to schedule a visit and establish care. When you choose a [facility] primary care physician or advanced practice provider, you gain a long-term partner in health. Find a Primary Care Provider  Learn more about [facility]'s in-office  and virtual care options: [facility] - Get Care Now

## 2024-01-10 ENCOUNTER — Other Ambulatory Visit: Payer: Self-pay

## 2024-01-10 ENCOUNTER — Other Ambulatory Visit (HOSPITAL_COMMUNITY): Payer: Self-pay

## 2024-01-10 ENCOUNTER — Other Ambulatory Visit: Payer: Self-pay | Admitting: Family Medicine

## 2024-01-10 MED ORDER — GABAPENTIN 800 MG PO TABS
1200.0000 mg | ORAL_TABLET | Freq: Three times a day (TID) | ORAL | 2 refills | Status: AC
  Filled 2024-01-10: qty 135, 30d supply, fill #0
  Filled 2024-01-26 – 2024-02-06 (×2): qty 135, 30d supply, fill #1
  Filled 2024-03-07: qty 135, 30d supply, fill #2

## 2024-01-15 NOTE — Addendum Note (Signed)
 Addended by: MOISHE CHIQUITA HERO on: 01/15/2024 08:07 AM   Modules accepted: Level of Service

## 2024-01-23 ENCOUNTER — Ambulatory Visit: Payer: Self-pay

## 2024-01-23 NOTE — Telephone Encounter (Signed)
 Noted, pt advised to go to ED for evaluation

## 2024-01-23 NOTE — Telephone Encounter (Signed)
 No openings at PCP office, offices in the surrounding region, or at [facility] or [facility]  FYI Only or Action Required?: FYI only for provider: patient going to Urgent Care.  Patient was last seen in primary care on 01/07/2024 by Moishe Chiquita HERO, NP.  Called Nurse Triage reporting Shortness of Breath.  Symptoms began 4 weeks ago but worse this week.  Interventions attempted: Prescription medications: doxycycline , albuterol  nebulizer and Rest, hydration, or home remedies.  Symptoms are: gradually worsening.  Triage Disposition: See HCP Within 4 Hours (Or PCP Triage)  Patient/caregiver understands and will follow disposition?: [facility]               Copied from CRM [id number]. Topic: Clinical - Red Word Triage >> Jan 23, 2024  8:52 AM Rosina BIRCH wrote: Red Word that prompted transfer to Nurse Triage:patient called stating he was exposed to flu and has been sick for four weeks. He did a virtual call and they called him in some doxycycline  but it did not do anything and that has been two weeks ago Reason for Disposition  [1] MILD difficulty breathing (e.g., minimal/no SOB at rest, SOB with walking, pulse < 100) AND [2] NEW-onset or WORSE than normal  Answer Assessment - Initial Assessment Questions Patient had a video visit 01/07/2024 and diagnosed with acute bacterial bronchitis. Patient had doxycycline  prescribed but states it didn't help a lot Patient also states he was exposed to the flu this week--his son has the flu. Congestion Body aches History of asthma--states two nights ago he had to use his Albuterol  nebulizer and he states he has not had to use this in years Productive cough--phlegm turning green again No known fever but felt feverish two nights ago Patient finished doxycycline  over a week ago  Patient advised that it is recommended he be seen and evaluated today in the next four hours. No openings at PCP office today or at any offices  within the region. Patient states they are near [facility] at [facility] and spoke with Randall who advised they did not have any openings today Called CAL at [facility] and spoke with Harlene who advised no openings there either. Patient was advised nearest Urgent Care at this time. Patient states he will find the nearest one to him.  Patient is advised to call us  back if anything changes or with any further questions/concerns. Patient is advised that if anything worsens to go to the Emergency Room. Patient verbalized understanding.  Protocols used: Breathing Difficulty-A-AH

## 2024-01-24 ENCOUNTER — Other Ambulatory Visit (HOSPITAL_COMMUNITY): Payer: Self-pay

## 2024-01-28 ENCOUNTER — Other Ambulatory Visit: Payer: Self-pay

## 2024-01-28 ENCOUNTER — Other Ambulatory Visit: Payer: Self-pay | Admitting: Family Medicine

## 2024-01-29 ENCOUNTER — Other Ambulatory Visit: Payer: Self-pay

## 2024-01-29 ENCOUNTER — Other Ambulatory Visit (HOSPITAL_COMMUNITY): Payer: Self-pay

## 2024-01-29 ENCOUNTER — Telehealth (HOSPITAL_COMMUNITY): Payer: Self-pay

## 2024-01-29 MED ORDER — LEVALBUTEROL TARTRATE 45 MCG/ACT IN AERO
2.0000 | INHALATION_SPRAY | Freq: Every day | RESPIRATORY_TRACT | 1 refills | Status: AC | PRN
  Filled 2024-01-29: qty 15, 100d supply, fill #0

## 2024-01-29 NOTE — Telephone Encounter (Signed)
 Pharmacy Patient Advocate Encounter  Received notification from [employer] that Prior Authorization for Levalbuterol  Tartrate 45MCG/ACT aerosol  has been APPROVED from 01/29/24 to 01/29/25. Ran test claim, Copay is $0. This test claim was processed through [facility]- copay amounts may vary at other pharmacies due to pharmacy/plan contracts, or as the patient moves through the different stages of their insurance plan.   PA #/Case ID/Reference #: [id number]

## 2024-02-01 ENCOUNTER — Ambulatory Visit: Payer: Self-pay | Admitting: Family Medicine

## 2024-02-01 NOTE — Telephone Encounter (Signed)
 FYI Only or Action Required?: Action required by provider: clinical question for provider, update on patient condition, and sch'd 02/05/24.  Patient was last seen in primary care on 01/07/2024 by Moishe Chiquita HERO, NP.  Called Nurse Triage reporting Cough.  Symptoms began several weeks ago.  Interventions attempted: Prescription medications: amoxicillin ; doxycycline .  Symptoms are: unchanged.  Triage Disposition: See PCP When Office is Open (Within 3 Days)  Patient/caregiver understands and will follow disposition?: Yes   Copied from CRM #[id number]. Topic: Clinical - Medical Advice >> Feb 01, 2024  1:03 PM Robinson H wrote: Reason for CRM: Has had a cold over a month been through 2 rounds of antibiotics and still coughing, rattling and wheezing some and doesn't want it to get worse, wants to know if he needs another round of antibiotics. No issues breathing right now or pain  Nyshawn [phone]   Reason for Disposition  Cough has been present for > 3 weeks  Answer Assessment - Initial Assessment Questions Returned call to pt to f/u on symptoms. Pt states he has completed doxy from telehealth appt and amoxicillin  from UC but productive cough has been persistent. Pt reports some ratting and wheezing, denies SOB or chest pain, relieved with albuterol  inhaler and at home nebulizer tx with albuterol  amp. Pt denies fever, hemoptysis. Pt agreeable to abx but would prefer to have abx sent to [facility] pharmacy. Will keep appt scheduled if symptoms do not improve. Appointment scheduled for evaluation. Patient agrees with plan of care, and will call back if anything changes, or if symptoms worsen.      1. ONSET: When did the cough begin?      12/08; seen via telehealth, symptoms did not improve and pt seen at [facility]. Cough still present   2. SEVERITY: How bad is the cough today?      Mild-moderate  3. SPUTUM: Describe the color of your sputum (e.g., none, dry cough; clear, [place], yellow,  green)     Yellow   4. HEMOPTYSIS: Are you coughing up any blood? If Yes, ask: How much? (e.g., flecks, streaks, tablespoons, etc.)     None   5. DIFFICULTY BREATHING: Are you having difficulty breathing? If Yes, ask: How bad is it? (e.g., mild, moderate, severe)      No; pt reports wheezing at times but using albuterol  inhaler and at home neb tx   6. FEVER: Do you have a fever? If Yes, ask: What is your temperature, how was it measured, and when did it start?     No   10. OTHER SYMPTOMS: Do you have any other symptoms? (e.g., runny nose, wheezing, chest pain)       Runny nose; no sob, chest pain  Protocols used: Cough - Acute Productive-A-AH

## 2024-02-04 NOTE — Telephone Encounter (Signed)
"  Appt 02/05/2024  "

## 2024-02-05 ENCOUNTER — Ambulatory Visit: Admitting: Family Medicine

## 2024-02-05 ENCOUNTER — Encounter: Payer: Self-pay | Admitting: Family Medicine

## 2024-02-05 ENCOUNTER — Other Ambulatory Visit (HOSPITAL_COMMUNITY): Payer: Self-pay

## 2024-02-05 ENCOUNTER — Other Ambulatory Visit: Payer: Self-pay

## 2024-02-05 VITALS — BP 120/80 | HR 65 | Temp 97.2°F | Ht 75.0 in | Wt 241.6 lb

## 2024-02-05 DIAGNOSIS — K5903 Drug induced constipation: Secondary | ICD-10-CM | POA: Diagnosis not present

## 2024-02-05 DIAGNOSIS — E1159 Type 2 diabetes mellitus with other circulatory complications: Secondary | ICD-10-CM | POA: Diagnosis not present

## 2024-02-05 DIAGNOSIS — J449 Chronic obstructive pulmonary disease, unspecified: Secondary | ICD-10-CM | POA: Diagnosis not present

## 2024-02-05 DIAGNOSIS — I152 Hypertension secondary to endocrine disorders: Secondary | ICD-10-CM

## 2024-02-05 MED ORDER — PREDNISONE 20 MG PO TABS
20.0000 mg | ORAL_TABLET | Freq: Every day | ORAL | 0 refills | Status: DC
  Filled 2024-02-05: qty 7, 7d supply, fill #0

## 2024-02-05 MED ORDER — LEVOFLOXACIN 750 MG PO TABS
750.0000 mg | ORAL_TABLET | Freq: Every day | ORAL | 0 refills | Status: DC
  Filled 2024-02-05: qty 7, 7d supply, fill #0

## 2024-02-05 MED ORDER — NITROGLYCERIN 0.4 % RE OINT
TOPICAL_OINTMENT | RECTAL | 5 refills | Status: AC
  Filled 2024-02-05: qty 30, 30d supply, fill #0

## 2024-02-05 NOTE — Progress Notes (Signed)
 "  Jeffery Wood is a 69 y.o. male who presents today for an office visit.  Assessment/Plan:  Chronic Problems Addressed Today: COPD mixed type (HCC) Exam and history are consistent with COPD flare and acute bronchitis.  No red flags and no signs of respiratory distress on exam today.  Symptoms have been persistent over a month at this point despite treatment with doxycycline  and amoxicillin .  He has been consistent with his albuterol  and trelegy.  We discussed treatment options.  We will start Levaquin  and prednisone  burst.  He has previously been hesitant to take prednisone  burst due to previous side effects but is agreeable at this point due to persistence of symptoms.  We did also discussed obtaining chest x-ray however would not significantly change management at this point.  He will let us  know if not improving in the next week or so and would consider imaging at that point.  We discussed reasons to return to care.  Hypertension associated with diabetes (HCC) Blood pressure at goal today on Imdur  15 mg daily and metoprolol  tartrate 25 mg twice daily.  Constipation Still having ongoing issues with hemorrhoids.  Using Preparation H with some modest improvement.  Insurance would not pay for hydrocortisone  suppositories.  We did discuss bowel regimen though he has had some issues with fecal incontinence with stool softeners and laxatives in the past.  Will send prescription in for nitroglycerin  gel.  We did discuss referral to surgery for definitive management however he like to hold off on this for now.     Subjective:  HPI:  See assessment / plan for status of chronic conditions.    Discussed the use of AI scribe software for clinical note transcription with the patient, who gave verbal consent to proceed.  History of Present Illness Jeffery Wood is a 69 year old male who presents with persistent respiratory symptoms and hemorrhoid issues.  He has been experiencing respiratory  symptoms for over a month, starting with nasal congestion and progressing to chest involvement, including wheezing and a persistent cough. Initially, he thought it was a minor cold but sought medical attention when symptoms worsened. During a virtual visit on December 8th, he was prescribed doxazine and acyclovir, which provided some relief. However, symptoms returned after completing the medication. He then visited urgent care and was prescribed amoxicillin , which helped but did not fully resolve his symptoms. He continues to experience chest rattling, coughing, and shortness of breath. He has resumed using his inhaler daily, which he had not used in over a year. He has not taken prednisone . He reports left-sided pain, which began after the onset of persistent coughing.  He also discusses ongoing issues with hemorrhoids, which have been problematic since his cancer treatment. He was prescribed Proctocort  suppositories, but did not purchase them due to cost. He uses Preparation H daily, which provides some relief. He has not been using stool softeners or laxatives regularly due to previous adverse effects, including incontinence, but occasionally uses them if necessary.         Objective:  Physical Exam: BP 120/80   Pulse 65   Temp (!) 97.2 F (36.2 C) (Temporal)   Ht 6' 3 (1.905 m)   Wt 241 lb 9.6 oz (109.6 kg)   SpO2 95%   BMI 30.20 kg/m   Gen: No acute distress, resting comfortably CV: Regular rate and rhythm with no murmurs appreciated Pulm: Normal work of breathing speaking in full sentences.  Scattered wheezes and rhonchi noted throughout  all lung fields. Neuro: Grossly normal, moves all extremities Psych: Normal affect and thought content      Tashina Credit M. Kennyth, MD 02/05/2024 7:44 AM  "

## 2024-02-05 NOTE — Patient Instructions (Addendum)
 It was very nice to see you today!  VISIT SUMMARY: Today, we addressed your persistent respiratory symptoms and hemorrhoid issues. You have been experiencing respiratory problems for over a month, and we discussed your ongoing hemorrhoid concerns.  YOUR PLAN: COPD EXACERBATION WITH ACUTE BRONCHITIS: You have a worsening of your COPD along with acute bronchitis, causing wheezing and congestion. -Start a prednisone  burst and take Levaquin  as prescribed. -If your symptoms do not improve by next week, please report back.  HEMORRHOIDS: You have chronic hemorrhoids that have been problematic since your cancer treatment. -Start using nitroglycerin  gel as prescribed. -If symptoms persist, we may need to consider surgical intervention.  DRUG-INDUCED CONSTIPATION: Your constipation is likely due to your cancer treatment and medication use. -Avoid regular use of stool softeners and laxatives due to previous adverse effects.  Return if symptoms worsen or fail to improve.   Take care, Dr Kennyth  PLEASE NOTE:  If you had any lab tests, please let us  know if you have not heard back within a few days. You may see your results on mychart before we have a chance to review them but we will give you a call once they are reviewed by us .   If we ordered any referrals today, please let us  know if you have not heard from their office within the next week.   If you had any urgent prescriptions sent in today, please check with the pharmacy within an hour of our visit to make sure the prescription was transmitted appropriately.   Please try these tips to maintain a healthy lifestyle:  Eat at least 3 REAL meals and 1-2 snacks per day.  Aim for no more than 5 hours between eating.  If you eat breakfast, please do so within one hour of getting up.   Each meal should contain half fruits/vegetables, one quarter protein, and one quarter carbs (no bigger than a computer mouse)  Cut down on sweet beverages. This  includes juice, soda, and sweet tea.   Drink at least 1 glass of water with each meal and aim for at least 8 glasses per day  Exercise at least 150 minutes every week.

## 2024-02-05 NOTE — Assessment & Plan Note (Signed)
 Exam and history are consistent with COPD flare and acute bronchitis.  No red flags and no signs of respiratory distress on exam today.  Symptoms have been persistent over a month at this point despite treatment with doxycycline  and amoxicillin .  He has been consistent with his albuterol  and trelegy.  We discussed treatment options.  We will start Levaquin  and prednisone  burst.  He has previously been hesitant to take prednisone  burst due to previous side effects but is agreeable at this point due to persistence of symptoms.  We did also discussed obtaining chest x-ray however would not significantly change management at this point.  He will let us  know if not improving in the next week or so and would consider imaging at that point.  We discussed reasons to return to care.

## 2024-02-05 NOTE — Assessment & Plan Note (Signed)
 Blood pressure at goal today on Imdur  15 mg daily and metoprolol  tartrate 25 mg twice daily

## 2024-02-05 NOTE — Assessment & Plan Note (Signed)
 Still having ongoing issues with hemorrhoids.  Using Preparation H with some modest improvement.  Insurance would not pay for hydrocortisone  suppositories.  We did discuss bowel regimen though he has had some issues with fecal incontinence with stool softeners and laxatives in the past.  Will send prescription in for nitroglycerin  gel.  We did discuss referral to surgery for definitive management however he like to hold off on this for now.

## 2024-02-06 ENCOUNTER — Other Ambulatory Visit: Payer: Self-pay

## 2024-02-07 ENCOUNTER — Other Ambulatory Visit: Payer: Self-pay

## 2024-02-07 ENCOUNTER — Ambulatory Visit: Admitting: Oncology

## 2024-02-08 ENCOUNTER — Ambulatory Visit: Admitting: Oncology

## 2024-02-13 ENCOUNTER — Other Ambulatory Visit (HOSPITAL_COMMUNITY): Payer: Self-pay

## 2024-02-14 ENCOUNTER — Other Ambulatory Visit: Payer: Self-pay | Admitting: Family Medicine

## 2024-02-15 ENCOUNTER — Other Ambulatory Visit: Payer: Self-pay

## 2024-02-15 ENCOUNTER — Other Ambulatory Visit: Payer: Self-pay | Admitting: Family Medicine

## 2024-02-15 ENCOUNTER — Other Ambulatory Visit (HOSPITAL_COMMUNITY): Payer: Self-pay

## 2024-02-15 ENCOUNTER — Other Ambulatory Visit: Payer: Self-pay | Admitting: *Deleted

## 2024-02-15 ENCOUNTER — Telehealth: Payer: Self-pay | Admitting: *Deleted

## 2024-02-15 MED ORDER — GLIMEPIRIDE 4 MG PO TABS
2.0000 mg | ORAL_TABLET | Freq: Every day | ORAL | 1 refills | Status: AC
  Filled 2024-02-15: qty 45, 90d supply, fill #0

## 2024-02-15 MED ORDER — LEVOFLOXACIN 750 MG PO TABS
750.0000 mg | ORAL_TABLET | Freq: Every day | ORAL | 0 refills | Status: DC
  Filled 2024-02-15 – 2024-02-16 (×2): qty 7, 7d supply, fill #0

## 2024-02-15 NOTE — Telephone Encounter (Signed)
 Copied from CRM [id number]. Topic: Clinical - Medication Question >> Feb 15, 2024 12:38 PM Chasity T wrote: Reason for CRM: patient is wanting another refill on the medication levofloxacin  (LEVAQUIN ) 750 MG tablet. He states he is still having a little bit of rattle in his chest but is somewhat better and thinks one more round of this medication will fully clear it up. medication was filled on 1/6 for only 7 tablets and the patient is out and needing a refill. Please contact him for further discussion.   Phone number: [phone]  Pharmacy:  [facility] [address] [place] [address] Phone: [phone] Fax: [phone]   Please advise  [facility]

## 2024-02-15 NOTE — Telephone Encounter (Signed)
 Rx Levaquin  send to [facility] pharmacy

## 2024-02-15 NOTE — Telephone Encounter (Signed)
 Ok to send in one more refill but needs visit if not improving.

## 2024-02-16 ENCOUNTER — Other Ambulatory Visit (HOSPITAL_COMMUNITY): Payer: Self-pay

## 2024-02-18 ENCOUNTER — Other Ambulatory Visit (HOSPITAL_COMMUNITY): Payer: Self-pay

## 2024-02-19 ENCOUNTER — Other Ambulatory Visit: Payer: Self-pay | Admitting: Family Medicine

## 2024-02-19 ENCOUNTER — Other Ambulatory Visit (HOSPITAL_COMMUNITY): Payer: Self-pay

## 2024-02-20 ENCOUNTER — Other Ambulatory Visit: Payer: Self-pay

## 2024-02-20 ENCOUNTER — Other Ambulatory Visit (HOSPITAL_COMMUNITY): Payer: Self-pay

## 2024-02-20 MED ORDER — TRAMADOL HCL 50 MG PO TABS
50.0000 mg | ORAL_TABLET | Freq: Three times a day (TID) | ORAL | 1 refills | Status: AC | PRN
  Filled 2024-02-20: qty 50, 17d supply, fill #0

## 2024-02-20 MED ORDER — TRAZODONE HCL 50 MG PO TABS
25.0000 mg | ORAL_TABLET | Freq: Every evening | ORAL | 3 refills | Status: AC | PRN
  Filled 2024-02-20: qty 30, 30d supply, fill #0

## 2024-03-03 ENCOUNTER — Emergency Department (HOSPITAL_BASED_OUTPATIENT_CLINIC_OR_DEPARTMENT_OTHER)

## 2024-03-03 ENCOUNTER — Other Ambulatory Visit: Payer: Self-pay

## 2024-03-03 ENCOUNTER — Emergency Department (HOSPITAL_BASED_OUTPATIENT_CLINIC_OR_DEPARTMENT_OTHER): Admission: EM | Admit: 2024-03-03 | Discharge: 2024-03-03 | Disposition: A | Discharge status: Home / Self Care

## 2024-03-03 ENCOUNTER — Ambulatory Visit: Admitting: Emergency Medicine

## 2024-03-03 ENCOUNTER — Encounter: Payer: Self-pay | Admitting: Emergency Medicine

## 2024-03-03 ENCOUNTER — Other Ambulatory Visit (HOSPITAL_COMMUNITY): Payer: Self-pay

## 2024-03-03 ENCOUNTER — Encounter (HOSPITAL_BASED_OUTPATIENT_CLINIC_OR_DEPARTMENT_OTHER): Payer: Self-pay

## 2024-03-03 VITALS — BP 142/66 | HR 79 | Ht 75.0 in | Wt 253.0 lb

## 2024-03-03 DIAGNOSIS — I110 Hypertensive heart disease with heart failure: Secondary | ICD-10-CM | POA: Insufficient documentation

## 2024-03-03 DIAGNOSIS — R0609 Other forms of dyspnea: Secondary | ICD-10-CM

## 2024-03-03 DIAGNOSIS — S0990XA Unspecified injury of head, initial encounter: Secondary | ICD-10-CM | POA: Insufficient documentation

## 2024-03-03 DIAGNOSIS — I10 Essential (primary) hypertension: Secondary | ICD-10-CM

## 2024-03-03 DIAGNOSIS — W19XXXD Unspecified fall, subsequent encounter: Secondary | ICD-10-CM

## 2024-03-03 DIAGNOSIS — Z7902 Long term (current) use of antithrombotics/antiplatelets: Secondary | ICD-10-CM | POA: Insufficient documentation

## 2024-03-03 DIAGNOSIS — S199XXD Unspecified injury of neck, subsequent encounter: Secondary | ICD-10-CM

## 2024-03-03 DIAGNOSIS — R0789 Other chest pain: Secondary | ICD-10-CM | POA: Diagnosis not present

## 2024-03-03 DIAGNOSIS — W19XXXA Unspecified fall, initial encounter: Secondary | ICD-10-CM

## 2024-03-03 DIAGNOSIS — S0990XD Unspecified injury of head, subsequent encounter: Secondary | ICD-10-CM

## 2024-03-03 DIAGNOSIS — I509 Heart failure, unspecified: Secondary | ICD-10-CM | POA: Insufficient documentation

## 2024-03-03 DIAGNOSIS — Z8572 Personal history of non-Hodgkin lymphomas: Secondary | ICD-10-CM | POA: Insufficient documentation

## 2024-03-03 DIAGNOSIS — E119 Type 2 diabetes mellitus without complications: Secondary | ICD-10-CM | POA: Insufficient documentation

## 2024-03-03 DIAGNOSIS — Z79899 Other long term (current) drug therapy: Secondary | ICD-10-CM | POA: Insufficient documentation

## 2024-03-03 DIAGNOSIS — S161XXA Strain of muscle, fascia and tendon at neck level, initial encounter: Secondary | ICD-10-CM | POA: Insufficient documentation

## 2024-03-03 DIAGNOSIS — R55 Syncope and collapse: Secondary | ICD-10-CM

## 2024-03-03 DIAGNOSIS — Z7984 Long term (current) use of oral hypoglycemic drugs: Secondary | ICD-10-CM | POA: Insufficient documentation

## 2024-03-03 LAB — BASIC METABOLIC PANEL WITH GFR
Anion gap: 12 (ref 5–15)
BUN: 12 mg/dL (ref 8–23)
CO2: 24 mmol/L (ref 22–32)
Calcium: 9.6 mg/dL (ref 8.9–10.3)
Chloride: 101 mmol/L (ref 98–111)
Creatinine, Ser: 0.6 mg/dL — ABNORMAL LOW (ref 0.61–1.24)
GFR, Estimated: >60 mL/min
Glucose, Bld: 204 mg/dL — ABNORMAL HIGH (ref 70–99)
Potassium: 4.1 mmol/L (ref 3.5–5.1)
Sodium: 137 mmol/L (ref 135–145)

## 2024-03-03 LAB — CBC
HCT: 39.6 % (ref 39.0–52.0)
Hemoglobin: 13.6 g/dL (ref 13.0–17.0)
MCH: 31.6 pg (ref 26.0–34.0)
MCHC: 34.3 g/dL (ref 30.0–36.0)
MCV: 92.1 fL (ref 80.0–100.0)
Platelets: 203 10*3/uL (ref 150–400)
RBC: 4.3 MIL/uL (ref 4.22–5.81)
RDW: 13.6 % (ref 11.5–15.5)
WBC: 6.9 10*3/uL (ref 4.0–10.5)
nRBC: 0 % (ref 0.0–0.2)

## 2024-03-03 LAB — PRO BRAIN NATRIURETIC PEPTIDE: Pro Brain Natriuretic Peptide: 94.9 pg/mL

## 2024-03-03 LAB — TROPONIN T, HIGH SENSITIVITY: Troponin T High Sensitivity: 15 ng/L (ref 0–19)

## 2024-03-03 MED ORDER — ISOSORBIDE MONONITRATE ER 30 MG PO TB24
45.0000 mg | ORAL_TABLET | Freq: Every day | ORAL | 0 refills | Status: AC
  Filled 2024-03-03 – 2024-03-05 (×2): qty 90, 60d supply, fill #0

## 2024-03-03 MED ORDER — ISOSORBIDE MONONITRATE ER 30 MG PO TB24
45.0000 mg | ORAL_TABLET | Freq: Every day | ORAL | 3 refills | Status: DC
  Filled 2024-03-03: qty 90, 60d supply, fill #0

## 2024-03-03 NOTE — Discharge Instructions (Signed)
Please use Tylenol for pain.  You may use 1000 mg of Tylenol every 6 hours.  Not to exceed 4 g of Tylenol within 24 hours.  

## 2024-03-03 NOTE — ED Triage Notes (Signed)
 Pt POV with wife d/t fall last week but was never seen. Pt had LOC and  Pt is on Plavix .  Pt went to see his PCP and she sent him here to get checked out and also wanted him to have a BNP drawn d/t gaining 10 lbs.

## 2024-03-03 NOTE — ED Provider Notes (Signed)
 " Jeffery [facility] EMERGENCY DEPARTMENT AT [facility] Provider Note   CSN: [id number] Arrival date & time: 03/03/24  1626     Patient presents with: Jeffery Wood is a 69 y.o. male.  Past medical history significant for diabetes, obstructive sleep apnea, non-Hodgkin's lymphoma, hypertension, CHF who presents with concern for fall, head injury with loss of consciousness last week.  Patient does take Plavix .  Endorses that he has been having increased fatigue, shortness of breath, and chest pressure with exertion.  Sent by cardiologist for workup and to ensure not in heart failure exacerbation.  Denies any chest pain or shortness of breath at this time.  Reports that he has had intermittent headaches since this fall.    Fall       Prior to Admission medications  Medication Sig Start Date End Date Taking? Authorizing Provider  ALBUTEROL  SULFATE HFA IN Inhale into the lungs. AS DIRECTED PRN    [provider]  atorvastatin  (LIPITOR) 40 MG tablet Take 1 tablet (40 mg total) by mouth daily. 10/03/23   Okey Vina GAILS, MD  azelastine  (ASTELIN ) 0.1 % nasal spray Place 2 sprays into both nostrils 2 (two) times daily. 06/27/23   Kennyth Worth HERO, MD  celecoxib  (CELEBREX ) 200 MG capsule Take 1 capsule (200 mg total) by mouth 2 (two) times daily as needed. 12/31/23   Kennyth Worth HERO, MD  ciclopirox  (PENLAC ) 8 % solution Apply topically at bedtime. Apply over nail and surrounding skin. Apply daily over previous coat. After seven (7) days, may remove with alcohol and continue cycle. 12/26/22   Gershon Donnice SAUNDERS, DPM  clopidogrel  (PLAVIX ) 75 MG tablet Take 1 tablet (75 mg total) by mouth daily with breakfast. 06/27/23   Okey Vina GAILS, MD  cyclobenzaprine  (FLEXERIL ) 10 MG tablet Take 1 tablet (10 mg total) by mouth 3 (three) times daily as needed for muscle spasms. 04/10/23     Fluticasone -Umeclidin-Vilant (TRELEGY ELLIPTA ) 100-62.5-25 MCG/ACT AEPB Inhale 1 puff then rinse mouth, once daily  11/19/23   Neysa Rama D, MD  gabapentin  (NEURONTIN ) 800 MG tablet Take 1.5 tablets (1,200 mg total) by mouth 3 (three) times daily. 01/10/24 04/09/24  Kennyth Worth HERO, MD  glimepiride  (AMARYL ) 4 MG tablet Take 0.5 tablets (2 mg total) by mouth daily before largest meal of the day. 02/15/24   Kennyth Worth HERO, MD  HYDROcodone -acetaminophen  (NORCO/VICODIN) 5-325 MG tablet Take 1-2 tablets by mouth every 6 (six) hours as needed. 06/06/23   Gershon Donnice SAUNDERS, DPM  HYDROCORTISONE  ACE, RECTAL, (PROCTOCORT ) 30 MG SUPP Place 1 suppository (30 mg total) rectally 2 (two) times daily as needed. Patient not taking: Reported on 03/03/2024 11/19/23   Kennyth Worth HERO, MD  ipratropium-albuterol  (DUONEB) 0.5-2.5 (3) MG/3ML SOLN Take 3 mLs by nebulization every 6 (six) hours as needed. 01/26/23   Lucius Krabbe, NP  isosorbide  mononitrate (IMDUR ) 30 MG 24 hr tablet Take 1.5 tablets (45 mg total) by mouth daily. 03/03/24 06/01/24  Campbell, Kenzie E, NP  levalbuterol  (XOPENEX  HFA) 45 MCG/ACT inhaler Inhale 2 puffs into the lungs daily as needed for wheezing or shortness of breath. 01/29/24   Kennyth Worth HERO, MD  levofloxacin  (LEVAQUIN ) 750 MG tablet Take 1 tablet (750 mg total) by mouth daily. Patient not taking: Reported on 03/03/2024 02/15/24   Kennyth Worth HERO, MD  metFORMIN  (GLUCOPHAGE ) 1000 MG tablet Take 1 tablet (1,000 mg total) by mouth 2 (two) times daily. 12/10/23   Kennyth Worth HERO, MD  metoprolol  tartrate (LOPRESSOR )  25 MG tablet Take 1 tablet (25 mg total) by mouth 2 (two) times daily. 06/08/23   Lelon Hamilton T, PA-C  Multiple Vitamin (MULTIVITAMIN) capsule Take 1 capsule by mouth daily.    [provider]  Nitroglycerin  0.4 % OINT Apply 1 inch (375 mg) ointment intra-anally every 12 hours for anal fissure 02/05/24   Kennyth Worth HERO, MD  oxyCODONE -acetaminophen  (PERCOCET/ROXICET) 5-325 MG tablet Take 1 - 2 tablets by mouth every 6 hours as needed for severe pain. 08/21/22   Gershon Donnice SAUNDERS, DPM   pantoprazole  (PROTONIX ) 40 MG tablet Take 1 tablet (40 mg total) by mouth daily. 12/10/23   Kennyth Worth HERO, MD  predniSONE  (DELTASONE ) 20 MG tablet Take 1 tablet (20 mg total) by mouth daily with breakfast. Patient not taking: Reported on 03/03/2024 02/05/24   Kennyth Worth HERO, MD  tamsulosin  (FLOMAX ) 0.4 MG CAPS capsule Take 1 capsule (0.4 mg) by mouth daily. 06/27/23   Kennyth Worth HERO, MD  traMADol  (ULTRAM ) 50 MG tablet Take 1 tablet (50 mg total) by mouth every 8 (eight) hours as needed. 02/20/24     traZODone  (DESYREL ) 50 MG tablet Take 0.5-1 tablets (25-50 mg total) by mouth at bedtime as needed for sleep. 02/20/24   Kennyth Worth HERO, MD    Allergies: Patient has no known allergies.    Review of Systems  All other systems reviewed and are negative.   Updated Vital Signs BP 136/67   Pulse 80   Temp 99 F (37.2 C) (Oral)   Resp 18   Ht 6' 3 (1.905 m)   Wt 114.8 kg   SpO2 96%   BMI 31.62 kg/m   Physical Exam Vitals and nursing note reviewed.  Constitutional:      General: He is not in acute distress.    Appearance: Normal appearance.  HENT:     Head: Normocephalic and atraumatic.     Comments: No obvious hematoma versus head trauma, posterior scalp is atraumatic appearing Eyes:     General:        Right eye: No discharge.        Left eye: No discharge.  Cardiovascular:     Rate and Rhythm: Normal rate and regular rhythm.     Heart sounds: No murmur heard.    No friction rub. No gallop.  Pulmonary:     Effort: Pulmonary effort is normal.     Breath sounds: Normal breath sounds.     Comments: No wheezing, rhonchi, stridor, rales Abdominal:     General: Bowel sounds are normal.     Palpations: Abdomen is soft.  Musculoskeletal:     Comments: Very mild lower extremity edema bilaterally  Skin:    General: Skin is warm and dry.     Capillary Refill: Capillary refill takes less than 2 seconds.  Neurological:     Mental Status: He is alert and oriented to person, place,  and time.  Psychiatric:        Mood and Affect: Mood normal.        Behavior: Behavior normal.     (all labs ordered are listed, but only abnormal results are displayed) Labs Reviewed  BASIC METABOLIC PANEL WITH GFR - Abnormal; Notable for the following components:      Result Value   Glucose, Bld 204 (*)    Creatinine, Ser 0.60 (*)    All other components within normal limits  PRO BRAIN NATRIURETIC PEPTIDE  CBC  TROPONIN T, HIGH SENSITIVITY  EKG: None  Radiology: CT Cervical Spine Wo Contrast Result Date: 03/03/2024 CLINICAL DATA:  Status post fall last week. EXAM: CT CERVICAL SPINE WITHOUT CONTRAST TECHNIQUE: Multidetector CT imaging of the cervical spine was performed without intravenous contrast. Multiplanar CT image reconstructions were also generated. RADIATION DOSE REDUCTION: This exam was performed according to the departmental dose-optimization program which includes automated exposure control, adjustment of the mA and/or kV according to patient size and/or use of iterative reconstruction technique. COMPARISON:  None Available. FINDINGS: Alignment: There is straightening of the normal cervical spine lordosis. Skull base and vertebrae: No acute fracture. No primary bone lesion or focal pathologic process. Soft tissues and spinal canal: No prevertebral fluid or swelling. No visible canal hematoma. Disc levels: Marked severity endplate sclerosis, anterior osteophyte formation and posterior bony spurring are seen at the levels of C5-C6 and C6-C7. Mild multilevel endplate sclerosis is present throughout the remainder of the cervical spine. There is moderate to marked severity intervertebral disc space narrowing at the levels of C5-C6 and C6-C7, with mild intervertebral disc space narrowing seen throughout the remainder of the cervical spine. Bilateral moderate to marked severity multilevel facet joint hypertrophy is noted. Upper chest: There is mild to moderate severity right apical  scarring and/or atelectasis. Other: There is marked severity bilateral carotid calcified atherosclerosis. IMPRESSION: 1. No acute fracture or subluxation in the cervical spine. 2. Marked severity degenerative changes at the levels of C5-C6 and C6-C7. 3. Mild to moderate severity right apical scarring and/or atelectasis. Electronically Signed   By: Suzen Dials M.D.   On: 03/03/2024 18:08   CT Head Wo Contrast Result Date: 03/03/2024 CLINICAL DATA:  Status post fall last week. EXAM: CT HEAD WITHOUT CONTRAST TECHNIQUE: Contiguous axial images were obtained from the base of the skull through the vertex without intravenous contrast. RADIATION DOSE REDUCTION: This exam was performed according to the departmental dose-optimization program which includes automated exposure control, adjustment of the mA and/or kV according to patient size and/or use of iterative reconstruction technique. COMPARISON:  None Available. FINDINGS: Brain: There is generalized cerebral atrophy with widening of the extra-axial spaces and ventricular dilatation. There are areas of decreased attenuation within the Mcniel matter tracts of the supratentorial brain, consistent with microvascular disease changes. Vascular: Moderate to marked severity bilateral cavernous carotid artery calcification is noted. Skull: Normal. Negative for fracture or focal lesion. Sinuses/Orbits: No acute finding. Other: None. IMPRESSION: 1. Generalized cerebral atrophy with chronic Puccinelli matter small vessel ischemic changes. 2. No acute intracranial abnormality. Electronically Signed   By: Suzen Dials M.D.   On: 03/03/2024 18:01   DG Chest Portable 1 View Result Date: 03/03/2024 EXAM: 1 VIEW XRAY OF THE CHEST 03/03/2024 05:31:00 PM COMPARISON: 08/30/2023 CLINICAL HISTORY: Chest pain. FINDINGS: LUNGS AND PLEURA: Stable right hilar prominence with linear atelectasis or scarring in right mid lung. No pleural effusion. No pneumothorax. HEART AND MEDIASTINUM: No  acute abnormality of the cardiac and mediastinal silhouettes. BONES AND SOFT TISSUES: No acute osseous abnormality. IMPRESSION: 1. Right hilar prominence with linear atelectasis or scarring in the right mid lung, unchanged from 08/30/2023. Electronically signed by: Norman Gatlin MD 03/03/2024 05:38 PM EST RP Workstation: [id number]     Procedures   Medications Ordered in the ED - No data to display                                  Medical Decision Making Amount and/or Complexity of  Data Reviewed Labs: ordered. Radiology: ordered.   This patient is a 69 y.o. male  who presents to the ED for concern of head injury, syncope.   Differential diagnoses prior to evaluation: The emergent differential diagnosis includes, but is not limited to,  epidural hematoma, subdural hematoma, skull fracture, subarachnoid hemorrhage, unstable cervical spine fracture, concussion vs other MSK injury, CVA, ACS, arrhythmia, vasovagal / orthostatic hypotension, sepsis, hypoglycemia, electrolyte disturbance, respiratory failure, anemia, dehydration, heat injury, polypharmacy, malignancy, anxiety/panic attack . This is not an exhaustive differential.   Past Medical History / Co-morbidities / Social History: diabetes, obstructive sleep apnea, non-Hodgkin's lymphoma, hypertension, CHF  Additional history: Chart reviewed. Pertinent results include: reviewed previous ECHO, left heart cath  Physical Exam: Physical exam performed. The pertinent findings include: No obvious hematoma versus head trauma, posterior scalp is atraumatic appearing No wheezing, rhonchi, stridor, rales Very mild lower extremity edema bilaterally   Intact strength bilateral upper and lower extremities, normal sensation throughout  Lab Tests/Imaging studies: I personally interpreted labs/imaging and the pertinent results include:  CBC unremarkable, troponin negative x 1, unremarkable BNP, BMP overall unremarkable, glucose mildly elevated at  204. CT cervical spine, CT head wo contrast, plain film chest xray.  1. Right hilar prominence with linear atelectasis or scarring in the right mid  lung, unchanged from 08/30/2023.   1. No acute fracture or subluxation in the cervical spine.  2. Marked severity degenerative changes at the levels of C5-C6 and  C6-C7.  3. Mild to moderate severity right apical scarring and/or  atelectasis.   1. Generalized cerebral atrophy with chronic Odriscoll matter small  vessel ischemic changes.  2. No acute intracranial abnormality.   I agree with the radiologist interpretation.  Cardiac monitoring: EKG obtained and interpreted by myself and attending physician which shows: NSR no significant change from recent baseline   Medications: Patient reassured, no additional medication changes today   Disposition: After consideration of the diagnostic results and the patients response to treatment, I feel that patient with no evidence of heart failure exacerbation, ACS, head injury or neck trauma, encourage PCP, cardiology follow-up.   emergency department workup does not suggest an emergent condition requiring admission or immediate intervention beyond what has been performed at this time. The plan is: as above. The patient is safe for discharge and has been instructed to return immediately for worsening symptoms, change in symptoms or any other concerns.   Final diagnoses:  Injury of head, initial encounter  Strain of neck muscle, initial encounter    ED Discharge Orders     None          Rosan Sherlean DEL, PA-C 03/03/24 1851    Simon Lavonia SAILOR, MD 03/03/24 2042  "

## 2024-03-04 ENCOUNTER — Telehealth: Payer: Self-pay

## 2024-03-04 NOTE — Telephone Encounter (Signed)
 Transition Care Management Follow-up Telephone Call Date of discharge and from where: 03/03/2024- [facility]  How have you been since you were released from the hospital? NO Any questions or concerns? No  Items Reviewed: Did the pt receive and understand the discharge instructions provided? Yes  Medications obtained and verified? Yes  Any new allergies since your discharge? No  Dietary orders reviewed? Yes Do you have support at home? Yes   Home Care and Equipment/Supplies: Were home health services ordered? not applicable Has the agency set up a time to come to the patient's home? not applicable Were any new equipment or medical supplies ordered?  No Were you able to get the supplies/equipment? not applicable Do you have any questions related to the use of the equipment or supplies? No  Functional Questionnaire: (I = Independent and D = Dependent) ADLs: I  Bathing/Dressing- I  Meal Prep- I  Eating- I  Maintaining continence- I  Transferring/Ambulation- I  Managing Meds- I  Follow up appointments reviewed:  PCP Hospital f/u appt confirmed? Yes  Scheduled to see Dr. Kennyth on 03/05/2024 @ 0840. Specialist Hospital f/u appt confirmed? No   Are transportation arrangements needed? No  If their condition worsens, is the pt aware to call PCP or go to the Emergency Dept.? Yes Was the patient provided with contact information for the PCP's office or ED? Yes Was to pt encouraged to call back with questions or concerns? Yes

## 2024-03-05 ENCOUNTER — Encounter: Payer: Self-pay | Admitting: Family Medicine

## 2024-03-05 ENCOUNTER — Other Ambulatory Visit: Payer: Self-pay

## 2024-03-05 ENCOUNTER — Ambulatory Visit: Admitting: Family Medicine

## 2024-03-05 ENCOUNTER — Other Ambulatory Visit: Payer: Self-pay | Admitting: Family Medicine

## 2024-03-05 ENCOUNTER — Other Ambulatory Visit (HOSPITAL_COMMUNITY): Payer: Self-pay

## 2024-03-05 VITALS — BP 130/70 | HR 61 | Temp 97.2°F | Ht 75.0 in | Wt 245.6 lb

## 2024-03-05 DIAGNOSIS — E1159 Type 2 diabetes mellitus with other circulatory complications: Secondary | ICD-10-CM

## 2024-03-05 DIAGNOSIS — N400 Enlarged prostate without lower urinary tract symptoms: Secondary | ICD-10-CM

## 2024-03-05 DIAGNOSIS — E1165 Type 2 diabetes mellitus with hyperglycemia: Secondary | ICD-10-CM

## 2024-03-05 DIAGNOSIS — I2510 Atherosclerotic heart disease of native coronary artery without angina pectoris: Secondary | ICD-10-CM

## 2024-03-05 DIAGNOSIS — R600 Localized edema: Secondary | ICD-10-CM | POA: Insufficient documentation

## 2024-03-05 LAB — MICROALBUMIN / CREATININE URINE RATIO
Creatinine,U: 130.4 mg/dL
Microalb Creat Ratio: 5.7 mg/g (ref 0.0–30.0)
Microalb, Ur: 0.7 mg/dL (ref 0.7–1.9)

## 2024-03-05 LAB — POCT GLYCOSYLATED HEMOGLOBIN (HGB A1C): Hemoglobin A1C: 5.5 % (ref 4.0–5.6)

## 2024-03-05 MED ORDER — FUROSEMIDE 20 MG PO TABS
20.0000 mg | ORAL_TABLET | Freq: Every day | ORAL | 3 refills | Status: AC
  Filled 2024-03-05: qty 30, 30d supply, fill #0

## 2024-03-05 NOTE — Assessment & Plan Note (Signed)
 Blood pressure at goal today on Imdur  45 mg daily and metoprolol  tartrate 25 mg twice daily.

## 2024-03-05 NOTE — Assessment & Plan Note (Signed)
 Follows with cardiology.  He is currently on Plavix  and Lipitor.  He would like to be seen at the [facility] facility due to proximity.  Will place new referral today.

## 2024-03-05 NOTE — Progress Notes (Signed)
 "  Jeffery Wood is a 69 y.o. male who presents today for an office visit.  Assessment/Plan:  New/Acute Problems: Fall Patient fell over a week ago and was in the ED a couple days ago after being directed there by his cardiologist.  He slipped on ice and hit his head and had brief loss of consciousness.  His workup in the ED was negative.  Does have some mild concussive symptoms that are improving.  Discussed with patient that he may have had a concussion and symptoms should gradually improve over the next couple weeks.  Do not need to do any further workup at this point as his symptoms are improving but we did discuss reasons to return to care.   Chronic Problems Addressed Today: T2DM (type 2 diabetes mellitus) (HCC) A1c at goal today at 5.5.  He is on metformin  1000 mg twice daily and Amaryl  2 mg daily.  Tolerating this regimen well.  Recheck A1c in 3 to 6 months.  Hypertension associated with diabetes (HCC) Blood pressure at goal today on Imdur  45 mg daily and metoprolol  tartrate 25 mg twice daily.  CAD (coronary artery disease) Follows with cardiology.  He is currently on Plavix  and Lipitor.  He would like to be seen at the [facility] facility due to proximity.  Will place new referral today.  Leg edema Patient with 1+ pitting edema bilaterally.  Workup in the ED was negative for CHF exacerbation and recent echocardiogram showed normal EF.  Will start low-dose 20 mg Lasix  daily.  He will follow-up with us  in a week or so.     Subjective:  HPI:  See assessment / plan for status of chronic conditions.   Discussed the use of AI scribe software for clinical note transcription with the patient, who gave verbal consent to proceed.  History of Present Illness Jeffery Wood is a 69 year old male with coronary artery disease who presents with chest discomfort and recent fall with head injury.  He was seen at his cardiologist 2 days ago to discuss the chest discomfort however while there  mentioned his recent fall with loss of consciousness and was directed go to emergency room.  In the emergency room had workup including CT scan which was negative and he was discharged home.  Workup at that time also included normal BNP and chest x-ray without signs of CHF exacerbation.  His cardiologist did increase his Imdur  to 45 mg daily to see if this would help with this chest discomfort though still has persistent discomfort in the left side of his chest.  He experiences chest discomfort primarily on the left side. His medication, Imdur , was increased to 45 mg, resulting in some improvement, but he continues to experience fatigue and edema in his lower legs. He has gained ten pounds recently and experiences urgency in urination without increased fluid intake. His recent BNP levels were within normal range.  He recently fell during a snowstorm, slipping on ice and hitting his head, which led to a brief loss of consciousness. A CT scan performed in the emergency room showed no acute findings. Since the fall, he has been experiencing mild headaches, which he did not have prior to the incident. He initially took Tylenol  for the headaches but has not taken any medication recently.  He has a history of coronary artery disease with five blockages, one of which was not treated due to collateral artery formation. He is on blood thinners, which was a concern following his  head injury.  His social history includes a high intake of Diet [facility], approximately 1.5 to 2 liters per day, and he does not drink water. He has a history of diabetes, managed with Metformin  and Amaryl , and his recent A1c was 5.5, indicating good control. He has a history of significant weight fluctuations, losing up to ten pounds overnight, which he attributes to fluid shifts. He reports frequent urination, especially at night.         Objective:  Physical Exam: BP 130/70   Pulse 61   Temp (!) 97.2 F (36.2 C) (Temporal)    Ht 6' 3 (1.905 m)   Wt 245 lb 9.6 oz (111.4 kg)   SpO2 96%   BMI 30.70 kg/m   Wt Readings from Last 3 Encounters:  03/05/24 245 lb 9.6 oz (111.4 kg)  03/03/24 253 lb (114.8 kg)  03/03/24 253 lb (114.8 kg)    Gen: No acute distress, resting comfortably CV: Regular rate and rhythm with no murmurs appreciated Pulm: Normal work of breathing, clear to auscultation bilaterally with no crackles, wheezes, or rhonchi MUSCULOSKELETAL: - Legs: 1+ pitting edema to knee bilaterally Neuro: Grossly normal, moves all extremities Psych: Normal affect and thought content  Time Spent: 45 minutes of total time was spent on the date of the encounter performing the following actions: chart review prior to seeing the patient including recent Emergency Department visit, obtaining history, performing a medically necessary exam, counseling on the treatment plan, placing orders, and documenting in our EHR.        Worth HERO. Kennyth, MD 03/05/2024 9:25 AM  "

## 2024-03-05 NOTE — Assessment & Plan Note (Signed)
 Patient with 1+ pitting edema bilaterally.  Workup in the ED was negative for CHF exacerbation and recent echocardiogram showed normal EF.  Will start low-dose 20 mg Lasix  daily.  He will follow-up with us  in a week or so.

## 2024-03-05 NOTE — Assessment & Plan Note (Signed)
 A1c at goal today at 5.5.  He is on metformin  1000 mg twice daily and Amaryl  2 mg daily.  Tolerating this regimen well.  Recheck A1c in 3 to 6 months.

## 2024-03-05 NOTE — Patient Instructions (Signed)
 It was very nice to see you today!  VISIT SUMMARY: During your visit, we addressed your chest discomfort, recent fall with head injury, diabetes management, and urinary symptoms. We discussed your coronary artery disease and fluid retention, mild concussion, well-controlled diabetes, and urinary urgency.  YOUR PLAN: CORONARY ARTERY DISEASE WITH FLUID RETENTION AND CHEST DISCOMFORT: You experience intermittent chest discomfort and fluid retention. Your BNP levels are normal, indicating no acute heart failure. The fluid retention is likely due to high caffeine intake. -Prescribe low dose Lasix  for fluid retention. -Refer to a cardiologist at the [facility] facility. -Monitor your weight and fluid intake. -Reduce your intake of caffeinated beverages.  CONCUSSION: You have a mild concussion with improved headaches. Your CT scan shows no acute injury. -Monitor your symptoms for another week. -Report any worsening or new symptoms.  TYPE 2 DIABETES MELLITUS: Your A1c is at 5.5%, indicating well-controlled diabetes. High caffeine intake is noted. -Continue your current diabetes management regimen. -Reduce your intake of caffeinated beverages.  BENIGN PROSTATIC HYPERPLASIA: You report urgency and inability to hold urine. -Continue to monitor your urinary symptoms.  Return in about 3 months (around 06/02/2024), or if symptoms worsen or fail to improve.   Take care, Dr Kennyth  PLEASE NOTE:  If you had any lab tests, please let us  know if you have not heard back within a few days. You may see your results on mychart before we have a chance to review them but we will give you a call once they are reviewed by us .   If we ordered any referrals today, please let us  know if you have not heard from their office within the next week.   If you had any urgent prescriptions sent in today, please check with the pharmacy within an hour of our visit to make sure the prescription was transmitted appropriately.    Please try these tips to maintain a healthy lifestyle:  Eat at least 3 REAL meals and 1-2 snacks per day.  Aim for no more than 5 hours between eating.  If you eat breakfast, please do so within one hour of getting up.   Each meal should contain half fruits/vegetables, one quarter protein, and one quarter carbs (no bigger than a computer mouse)  Cut down on sweet beverages. This includes juice, soda, and sweet tea.   Drink at least 1 glass of water with each meal and aim for at least 8 glasses per day  Exercise at least 150 minutes every week.

## 2024-03-06 ENCOUNTER — Ambulatory Visit: Payer: Self-pay | Admitting: Family Medicine

## 2024-03-06 ENCOUNTER — Other Ambulatory Visit: Payer: Self-pay

## 2024-03-06 ENCOUNTER — Other Ambulatory Visit (HOSPITAL_COMMUNITY): Payer: Self-pay

## 2024-03-06 MED ORDER — METFORMIN HCL 1000 MG PO TABS
1000.0000 mg | ORAL_TABLET | Freq: Two times a day (BID) | ORAL | 0 refills | Status: AC
  Filled 2024-03-06: qty 180, 90d supply, fill #0

## 2024-03-06 NOTE — Progress Notes (Signed)
 Urine sample is normal.  We can recheck in a year.

## 2024-03-07 ENCOUNTER — Other Ambulatory Visit: Payer: Self-pay

## 2024-03-07 ENCOUNTER — Other Ambulatory Visit (HOSPITAL_COMMUNITY): Payer: Self-pay

## 2024-03-10 ENCOUNTER — Inpatient Hospital Stay: Admitting: Oncology

## 2024-04-22 ENCOUNTER — Ambulatory Visit

## 2024-06-02 ENCOUNTER — Ambulatory Visit: Admitting: Family Medicine

## 2024-11-20 ENCOUNTER — Encounter: Admitting: Family Medicine
